# Patient Record
Sex: Male | Born: 1950
Health system: Southern US, Community
[De-identification: ages and names within clinical notes are randomized; demographics above are authoritative.]

## PROBLEM LIST (undated history)

## (undated) DIAGNOSIS — I4891 Unspecified atrial fibrillation: Secondary | ICD-10-CM

## (undated) DIAGNOSIS — F1020 Alcohol dependence, uncomplicated: Secondary | ICD-10-CM

## (undated) DIAGNOSIS — I639 Cerebral infarction, unspecified: Secondary | ICD-10-CM

## (undated) DIAGNOSIS — E785 Hyperlipidemia, unspecified: Secondary | ICD-10-CM

## (undated) DIAGNOSIS — I714 Abdominal aortic aneurysm, without rupture, unspecified: Secondary | ICD-10-CM

## (undated) DIAGNOSIS — I712 Thoracic aortic aneurysm, without rupture, unspecified: Secondary | ICD-10-CM

## (undated) DIAGNOSIS — I10 Essential (primary) hypertension: Secondary | ICD-10-CM

## (undated) DIAGNOSIS — N183 Chronic kidney disease, stage 3 unspecified: Secondary | ICD-10-CM

## (undated) HISTORY — DX: Chronic kidney disease, stage 3 unspecified: N18.30

## (undated) HISTORY — DX: Abdominal aortic aneurysm, without rupture: I71.4

## (undated) HISTORY — DX: Cerebral infarction, unspecified: I63.9

## (undated) HISTORY — DX: Unspecified atrial fibrillation: I48.91

## (undated) HISTORY — DX: Hyperlipidemia, unspecified: E78.5

## (undated) HISTORY — DX: Abdominal aortic aneurysm, without rupture, unspecified: I71.40

## (undated) HISTORY — DX: Thoracic aortic aneurysm, without rupture: I71.2

## (undated) HISTORY — DX: Essential (primary) hypertension: I10

## (undated) HISTORY — DX: Thoracic aortic aneurysm, without rupture, unspecified: I71.20

---

## 2000-03-13 HISTORY — PX: CARDIOVASCULAR STRESS TEST: SHX262

## 2000-09-17 ENCOUNTER — Ambulatory Visit (HOSPITAL_COMMUNITY): Admission: RE | Admit: 2000-09-17 | Discharge: 2000-09-17 | Payer: Self-pay | Admitting: Family Medicine

## 2000-12-17 ENCOUNTER — Encounter: Payer: Self-pay | Admitting: Family Medicine

## 2000-12-17 ENCOUNTER — Ambulatory Visit (HOSPITAL_COMMUNITY): Admission: RE | Admit: 2000-12-17 | Discharge: 2000-12-17 | Payer: Self-pay | Admitting: Family Medicine

## 2004-06-23 ENCOUNTER — Ambulatory Visit (HOSPITAL_COMMUNITY): Admission: RE | Admit: 2004-06-23 | Discharge: 2004-06-23 | Payer: Self-pay | Admitting: Family Medicine

## 2009-12-07 ENCOUNTER — Encounter: Payer: Self-pay | Admitting: Gastroenterology

## 2009-12-08 ENCOUNTER — Encounter: Payer: Self-pay | Admitting: Gastroenterology

## 2009-12-11 HISTORY — PX: COLONOSCOPY: SHX174

## 2009-12-13 ENCOUNTER — Ambulatory Visit: Payer: Self-pay | Admitting: Gastroenterology

## 2009-12-13 ENCOUNTER — Ambulatory Visit (HOSPITAL_COMMUNITY): Admission: RE | Admit: 2009-12-13 | Discharge: 2009-12-13 | Payer: Self-pay | Admitting: Gastroenterology

## 2010-04-12 NOTE — Letter (Signed)
Summary: TRIAGE ORDER  TRIAGE ORDER   Imported By: Ave Filter 12/08/2009 10:12:53  _____________________________________________________________________  External Attachment:    Type:   Image     Comment:   External Document

## 2010-04-12 NOTE — Letter (Signed)
Summary: [name]  [name]   Imported By: Cloria Spring LPN 16/12/9602 54:09:81  _____________________________________________________________________  External Attachment:    Type:   Image     Comment:   External Document  Appended Document: [name] [name]  Appended Document: [name] Scheduled for 12/13/2009 @ 7:30. Order faxed to [facility]. Rx and instructions faxed to [facility]. LMOM for pt that it has been faxed.

## 2012-07-30 ENCOUNTER — Encounter: Payer: Self-pay | Admitting: Family Medicine

## 2012-07-30 ENCOUNTER — Ambulatory Visit (INDEPENDENT_AMBULATORY_CARE_PROVIDER_SITE_OTHER): Payer: BC Managed Care – PPO | Admitting: Family Medicine

## 2012-07-30 VITALS — BP 124/78 | Temp 99.0°F | Wt 203.0 lb

## 2012-07-30 DIAGNOSIS — M545 Low back pain, unspecified: Secondary | ICD-10-CM

## 2012-07-30 DIAGNOSIS — E785 Hyperlipidemia, unspecified: Secondary | ICD-10-CM

## 2012-07-30 DIAGNOSIS — Z125 Encounter for screening for malignant neoplasm of prostate: Secondary | ICD-10-CM

## 2012-07-30 DIAGNOSIS — R1013 Epigastric pain: Secondary | ICD-10-CM

## 2012-07-30 DIAGNOSIS — Z79899 Other long term (current) drug therapy: Secondary | ICD-10-CM

## 2012-07-30 DIAGNOSIS — Z0000 Encounter for general adult medical examination without abnormal findings: Secondary | ICD-10-CM

## 2012-07-30 LAB — POCT URINALYSIS DIPSTICK
Spec Grav, UA: 1.02
pH, UA: 6

## 2012-07-30 LAB — BASIC METABOLIC PANEL
Calcium: 9.3 mg/dL (ref 8.4–10.5)
Creat: 1.68 mg/dL — ABNORMAL HIGH (ref 0.50–1.35)
Sodium: 141 mEq/L (ref 135–145)

## 2012-07-30 LAB — LIPASE: Lipase: 39 U/L (ref 0–75)

## 2012-07-30 LAB — HEPATIC FUNCTION PANEL
ALT: 11 U/L (ref 0–53)
AST: 18 U/L (ref 0–37)
Albumin: 4.4 g/dL (ref 3.5–5.2)
Alkaline Phosphatase: 46 U/L (ref 39–117)
Total Bilirubin: 0.6 mg/dL (ref 0.3–1.2)
Total Protein: 6.9 g/dL (ref 6.0–8.3)

## 2012-07-30 LAB — CBC WITH DIFFERENTIAL/PLATELET
Basophils Absolute: 0.1 10*3/uL (ref 0.0–0.1)
Eosinophils Relative: 2 % (ref 0–5)
HCT: 39.8 % (ref 39.0–52.0)
Lymphocytes Relative: 21 % (ref 12–46)
Lymphs Abs: 1.7 10*3/uL (ref 0.7–4.0)
MCV: 101 fL — ABNORMAL HIGH (ref 78.0–100.0)
Neutro Abs: 5.6 10*3/uL (ref 1.7–7.7)
Platelets: 245 10*3/uL (ref 150–400)
RBC: 3.94 MIL/uL — ABNORMAL LOW (ref 4.22–5.81)
WBC: 8.1 10*3/uL (ref 4.0–10.5)

## 2012-07-30 LAB — LIPID PANEL
HDL: 85 mg/dL (ref >39)
Total CHOL/HDL Ratio: 2.4 Ratio
Triglycerides: 53 mg/dL (ref <150)

## 2012-07-30 MED ORDER — PANTOPRAZOLE SODIUM 40 MG PO TBEC: 40.0000 mg | DELAYED_RELEASE_TABLET | Freq: Every day | ORAL | Status: DC

## 2012-07-30 NOTE — Progress Notes (Signed)
  Subjective:    Patient ID: Anthony Little, male    DOB: Mar 20, 1950, 62 y.o.   MRN: [id number]  HPI  Patient having abdominal bloating and back pain for 2 weeks A, upper abdomen,   ache in lower back, intensity varies, movement doesn't affect it, worse with standing, doesn't radiate, no known trigger, nothing tried for it. Also feels bloated also passing gas, no paoin with eating, no regurg, BM-nl Smoking - Still smokes she is trying to work his way down he has been counseled Family history noncontributory Review of Systems Negative for vomiting diarrhea he relates more bloating some pain with at times but nothing severe he just feels more like a bloated sensation also has some intermittent dull sensation in the left flank area but no dysuria or hematuria no hematochezia.     Objective:   Physical Exam Neck no masses lungs are clear no crackles, heart is regular no murmurs, pulses are normal skin warm dry no edema in the legs abdomen is soft no guarding rebound or tenderness no enlargement of the organs noted no masses are noted. Prostate exam is normal.       Assessment & Plan:  Laboratory work was ordered protonix 40 mg 1 daily he will followup in 2-3 weeks this as a therapeutic trial we'll see how it does help his symptoms if he doesn't improve he will need referral for possible ultrasound CAT scan and GI workup possible EGD. Probable dyspepsia Flank discomfort we'll see how that is doing on followup may need further testing as well.

## 2012-07-31 LAB — PSA: PSA: 1.06 ng/mL (ref <=4.00)

## 2012-08-06 ENCOUNTER — Telehealth: Payer: Self-pay | Admitting: *Deleted

## 2012-08-06 ENCOUNTER — Other Ambulatory Visit: Payer: Self-pay | Admitting: *Deleted

## 2012-08-06 DIAGNOSIS — Z79899 Other long term (current) drug therapy: Secondary | ICD-10-CM

## 2012-08-06 NOTE — Telephone Encounter (Signed)
done

## 2012-08-10 ENCOUNTER — Encounter: Payer: Self-pay | Admitting: *Deleted

## 2012-08-15 ENCOUNTER — Telehealth: Payer: Self-pay | Admitting: Family Medicine

## 2012-08-15 DIAGNOSIS — Z79899 Other long term (current) drug therapy: Secondary | ICD-10-CM

## 2012-08-15 DIAGNOSIS — D649 Anemia, unspecified: Secondary | ICD-10-CM

## 2012-08-15 NOTE — Telephone Encounter (Signed)
Blood work Clinical biochemist and mailed to patient.

## 2012-08-15 NOTE — Telephone Encounter (Signed)
Met 7 , cbc , B 12  VHQ:[id number] anemia, renal insuff, B12 deficiency

## 2012-08-15 NOTE — Telephone Encounter (Signed)
Patient says that he spoke with Dr. Lorin Picket last week about having BW mailed to him, but hasn't received anything yet. He would like to pick up his BW paperwork tomorrow.

## 2012-08-16 LAB — BASIC METABOLIC PANEL
Calcium: 9.6 mg/dL (ref 8.4–10.5)
Glucose, Bld: 97 mg/dL (ref 70–99)
Potassium: 4.6 mEq/L (ref 3.5–5.3)
Sodium: 141 mEq/L (ref 135–145)

## 2012-08-16 LAB — CBC WITH DIFFERENTIAL/PLATELET
Basophils Absolute: 0.1 10*3/uL (ref 0.0–0.1)
Eosinophils Relative: 3 % (ref 0–5)
HCT: 37.4 % — ABNORMAL LOW (ref 39.0–52.0)
Lymphocytes Relative: 24 % (ref 12–46)
Lymphs Abs: 1.8 10*3/uL (ref 0.7–4.0)
MCV: 98.7 fL (ref 78.0–100.0)
Monocytes Absolute: 0.5 10*3/uL (ref 0.1–1.0)
Neutro Abs: 4.8 10*3/uL (ref 1.7–7.7)
RBC: 3.79 MIL/uL — ABNORMAL LOW (ref 4.22–5.81)
RDW: 13.6 % (ref 11.5–15.5)
WBC: 7.3 10*3/uL (ref 4.0–10.5)

## 2012-08-20 ENCOUNTER — Telehealth: Payer: Self-pay | Admitting: Family Medicine

## 2012-08-20 ENCOUNTER — Ambulatory Visit: Payer: BC Managed Care – PPO | Admitting: Family Medicine

## 2012-08-20 NOTE — Telephone Encounter (Signed)
Patient was seen on Jul 30, 2012 by Dr. Lorin Picket.  Wife called today to state that the medication Mr. Schwier is currently taking is not helping with the pain in his stomach.  Would like to know if other test such as an Ultrasound can be ordered before patient's next appointment.  States patient is not eating much at any given time due to pain this causes.  Next appointment is Tuesday August 27, 2012 at 2:00pm    Please call patient with recommendations.

## 2012-08-20 NOTE — Telephone Encounter (Signed)
This patient would best benefit from a CAT scan of the abdomen with contrast. It is important to confirm with the patient that he wants to go forward with this. If so we can set this CAT scan up before his office visit preferably so we will have results to discuss with him when he comes.

## 2012-08-23 NOTE — Telephone Encounter (Signed)
Spoke with Nurse.  She has left a message for a return call

## 2012-08-23 NOTE — Telephone Encounter (Signed)
Left message to return call 

## 2012-08-23 NOTE — Telephone Encounter (Signed)
Please see previous note, make sure it is being handled. Thank you.

## 2012-08-26 NOTE — Telephone Encounter (Signed)
Apparently playing phone tag. Please verify with family what their needs are. Thank you.

## 2012-08-27 ENCOUNTER — Encounter: Payer: Self-pay | Admitting: Family Medicine

## 2012-08-27 ENCOUNTER — Ambulatory Visit (INDEPENDENT_AMBULATORY_CARE_PROVIDER_SITE_OTHER): Payer: BC Managed Care – PPO | Admitting: Family Medicine

## 2012-08-27 VITALS — BP 148/80 | HR 70 | Wt 204.0 lb

## 2012-08-27 DIAGNOSIS — R1013 Epigastric pain: Secondary | ICD-10-CM

## 2012-08-27 NOTE — Telephone Encounter (Signed)
Patient didn't return call . To discuss at office visit today 08/27/12.

## 2012-08-27 NOTE — Telephone Encounter (Signed)
Patient here for office visit with Dr. Lorin Picket.

## 2012-08-27 NOTE — Progress Notes (Signed)
  Subjective:    Patient ID: Anthony Little, male    DOB: 01-Nov-1950, 62 y.o.   MRN: [id number]  HPIpatient here for abdominal bloating after eating. Started about 2 months ago. Patient not able to eat much. Patient relates that he feels a bloated feeling in his upper abdomen he also feels pressure and discomfort in the abdomen in addition this poor appetite this is more present over the past several months. He tried a PPI. Tried varying what he eats. He states at times does wake him up at night. He denies weight loss over the past month but he has had some moderate weight loss over the past 6 months. He denies any other particular troubles currently. Past medical history benign family history benign Patient does smoke he does not drink  Review of Systems See above. No vomiting of blood no rectal bleeding.    Objective:   Physical Exam  Lungs are clear heart is regular abdomen soft no masses are felt generalized abdominal discomfort midabdomen. No guarding or rebound. Extremities no edema.      Assessment & Plan:  Abdominal discomfort-it is concerning this been going on for several months. There has been some slight weight loss. I recommend that we pursue forward with combination of continuing PPI, lab work looked okay, order CT scan of the abdomen and pelvis with contrast, set up patient to be seen by gastroenterology. If CT scan negative probably will need EGD possibly workup including ultrasound and HIDA test. Patient understands all of this and agrees.

## 2012-08-29 ENCOUNTER — Encounter (HOSPITAL_COMMUNITY): Payer: Self-pay

## 2012-08-29 ENCOUNTER — Ambulatory Visit (HOSPITAL_COMMUNITY)
Admission: RE | Admit: 2012-08-29 | Discharge: 2012-08-29 | Disposition: A | Discharge status: Home / Self Care | Payer: BC Managed Care – PPO | Source: Ambulatory Visit | Attending: Family Medicine | Admitting: Family Medicine

## 2012-08-29 DIAGNOSIS — I10 Essential (primary) hypertension: Secondary | ICD-10-CM | POA: Insufficient documentation

## 2012-08-29 DIAGNOSIS — R1013 Epigastric pain: Secondary | ICD-10-CM | POA: Insufficient documentation

## 2012-08-29 DIAGNOSIS — K5730 Diverticulosis of large intestine without perforation or abscess without bleeding: Secondary | ICD-10-CM | POA: Insufficient documentation

## 2012-08-29 MED ORDER — IOHEXOL 300 MG/ML  SOLN
100.0000 mL | Freq: Once | INTRAMUSCULAR | Status: AC | PRN
  Administered 2012-08-29: 100 mL via INTRAVENOUS

## 2012-10-02 ENCOUNTER — Encounter: Payer: Self-pay | Admitting: Gastroenterology

## 2012-10-02 ENCOUNTER — Ambulatory Visit: Payer: BC Managed Care – PPO | Admitting: Gastroenterology

## 2012-10-02 ENCOUNTER — Ambulatory Visit (INDEPENDENT_AMBULATORY_CARE_PROVIDER_SITE_OTHER): Payer: BC Managed Care – PPO | Admitting: Gastroenterology

## 2012-10-02 VITALS — BP 145/71 | HR 67 | Temp 98.2°F | Ht 69.0 in | Wt 198.0 lb

## 2012-10-02 DIAGNOSIS — R6881 Early satiety: Secondary | ICD-10-CM

## 2012-10-02 DIAGNOSIS — R109 Unspecified abdominal pain: Secondary | ICD-10-CM

## 2012-10-02 DIAGNOSIS — G8929 Other chronic pain: Secondary | ICD-10-CM | POA: Insufficient documentation

## 2012-10-02 DIAGNOSIS — R1010 Upper abdominal pain, unspecified: Secondary | ICD-10-CM

## 2012-10-02 DIAGNOSIS — R634 Abnormal weight loss: Secondary | ICD-10-CM

## 2012-10-02 NOTE — Progress Notes (Signed)
Primary Care Physician:  Lilyan Punt, MD  Primary Gastroenterologist:  Jonette Eva, MD   Chief Complaint  Patient presents with  . Bloated  . Gas    HPI:  Anthony Little is a 62 y.o. male here for further evaluation of abdominal pain and weight loss. He was seen initially in May by his PCP for the symptoms. He was started on pantoprazole at that time. He had a CT the abdomen pelvis with contrast on 08/27/2012 that showed descending and sigmoid diverticulosis, aortoiliac arterial calcifications but otherwise unremarkable. Labs as outlined below.  Four months of early satiety, bloating, increased flatulence, discomfort in epigastrium and radiating into left back. Lost 10 pounds in last three months. Not really trying. Has eliminated fried foods. Increased dietary fiber. New diet about 7-8 months ago. No heartburn, dysphagia, diarrhea, melena, brbpr. Some intermittent constipation, rare Dulcolax. No ASA, NSAIDs. Took pantoprazole 40mg  daily for one month without notable improvement. Stopped few weeks ago.   Current Outpatient Prescriptions  Medication Sig Dispense Refill  . lisinopril-hydrochlorothiazide (PRINZIDE,ZESTORETIC) 20-12.5 MG per tablet Take 1 tablet by mouth daily.      . Multiple Vitamin (MULTIVITAMIN) tablet Take 1 tablet by mouth daily.       No current facility-administered medications for this visit.    Allergies as of 10/02/2012  . (No Known Allergies)    Past Medical History  Diagnosis Date  . Hypertension   . Hyperlipidemia     Past Surgical History  Procedure Laterality Date  . Colonoscopy  12/2009    Diverticulosis, mild internal hemorrhoids, multiple colon polyps removed (adenomatous). One irregular shaped polyp was tattooed. Next colonoscopy due October 2016.  . Cardiovascular stress test  2002    negative    Family History  Problem Relation Age of Onset  . Diabetes Mother   . Diabetes Maternal Grandfather   . Ulcers Neg Hx   . Colon cancer Neg  Hx     History   Social History  . Marital Status: Single    Spouse Name: N/A    Number of Children: N/A  . Years of Education: N/A   Occupational History  . [employer]    Social History Main Topics  . Smoking status: Current Every Day Smoker  . Smokeless tobacco: Not on file  . Alcohol Use: Yes     Comment: liquor 5 ounces per night, long-time  . Drug Use: Not on file  . Sexually Active: Not on file   Other Topics Concern  . Not on file   Social History Narrative  . No narrative on file      ROS:  General: Negative for anorexia, fever, chills, fatigue, weakness. Eyes: Negative for vision changes.  ENT: Negative for hoarseness, difficulty swallowing , nasal congestion. CV: Negative for chest pain, angina, palpitations, dyspnea on exertion, peripheral edema.  Respiratory: Negative for dyspnea at rest, dyspnea on exertion, cough, sputum, wheezing.  GI: See history of present illness. GU:  Negative for dysuria, hematuria, urinary incontinence, urinary frequency, nocturnal urination.  MS: Negative for joint pain, low back pain.  Derm: Negative for rash or itching.  Neuro: Negative for weakness, abnormal sensation, seizure, frequent headaches, memory loss, confusion.  Psych: Negative for anxiety, depression, suicidal ideation, hallucinations.  Endo: see hpi Heme: Negative for bruising or bleeding. Allergy: Negative for rash or hives.    Physical Examination:  BP 145/71  Pulse 67  Temp(Src) 98.2 F (36.8 C) (Oral)  Ht 5\' 9"  (1.753 m)  Wt 198  lb (89.812 kg)  BMI 29.23 kg/m2   General: Well-nourished, well-developed in no acute distress.  Head: Normocephalic, atraumatic.   Eyes: Conjunctiva pink, no icterus. Mouth: Oropharyngeal mucosa moist and pink , no lesions erythema or exudate. Neck: Supple without thyromegaly, masses, or lymphadenopathy.  Lungs: Clear to auscultation bilaterally.  Heart: Regular rate and rhythm, no murmurs rubs or gallops.   Abdomen: Bowel sounds are normal, nontender, nondistended, no hepatosplenomegaly or masses, no abdominal bruits or    hernia , no rebound or guarding.   Rectal: not performed Extremities: No lower extremity edema. No clubbing or deformities.  Neuro: Alert and oriented x 4 , grossly normal neurologically.  Skin: Warm and dry, no rash or jaundice.   Psych: Alert and cooperative, normal mood and affect.  Labs: Lab Results  Component Value Date   WBC 7.3 08/16/2012   HGB 12.9* 08/16/2012   HCT 37.4* 08/16/2012   MCV 98.7 08/16/2012   PLT 259 08/16/2012   Lab Results  Component Value Date   VITAMINB12 403 08/16/2012   Lab Results  Component Value Date   CREATININE 1.22 08/16/2012   BUN 20 08/16/2012   NA 141 08/16/2012   K 4.6 08/16/2012   CL 103 08/16/2012   CO2 25 08/16/2012   Lab Results  Component Value Date   ALT 11 07/30/2012   AST 18 07/30/2012   ALKPHOS 46 07/30/2012   BILITOT 0.6 07/30/2012   Lab Results  Component Value Date   LIPASE 39 07/30/2012     Imaging Studies: No results found.

## 2012-10-02 NOTE — Patient Instructions (Addendum)
We have scheduled you for upper endoscopy with Dr. Darrick Penna. Please see separate instructions.

## 2012-10-02 NOTE — Assessment & Plan Note (Signed)
62 year old gentleman with 4-5 month history of vague upper abdominal discomfort associated with bloating, early satiety, 10 pound unintentional weight loss. No improvement with 30 days of pantoprazole. Denies aspirin or NSAIDs. He does consume significant amount of liquor on the weekends. Recent CT scan of the abdomen did not show any signs of advanced liver disease. He had mild normocytic anemia on his last CBC.  Recommend upper endoscopy for further evaluation. Differential diagnosis includes gastritis, peptic ulcer disease, malignancy. Biliary etiology not excluded but less likely based on his symptoms. We will augment conscious sedation with Phenergan 25 mg IV 30 minutes before the procedure given his history of alcohol use. He recalls waking up briefly at time of colonoscopy a few years back, at that time he received Demerol 100 mg IV/Versed 7 mg IV.  I have discussed the risks, alternatives, benefits with regards to but not limited to the risk of reaction to medication, bleeding, infection, perforation and the patient is agreeable to proceed. Written consent to be obtained.  Recommend decreasing etoh use with goals of stopping. Based on EGD findings, consider recheck CBC to make sure no further drop in H/H.

## 2012-10-02 NOTE — Progress Notes (Signed)
Cc PCP 

## 2012-10-07 ENCOUNTER — Other Ambulatory Visit: Payer: Self-pay | Admitting: Gastroenterology

## 2012-10-07 ENCOUNTER — Encounter (HOSPITAL_COMMUNITY): Payer: Self-pay | Admitting: Pharmacy Technician

## 2012-10-07 DIAGNOSIS — R6881 Early satiety: Secondary | ICD-10-CM

## 2012-10-07 DIAGNOSIS — R109 Unspecified abdominal pain: Secondary | ICD-10-CM

## 2012-10-07 DIAGNOSIS — R634 Abnormal weight loss: Secondary | ICD-10-CM

## 2012-10-08 ENCOUNTER — Encounter (HOSPITAL_COMMUNITY): Payer: Self-pay

## 2012-10-08 ENCOUNTER — Ambulatory Visit (HOSPITAL_COMMUNITY)
Admission: RE | Admit: 2012-10-08 | Discharge: 2012-10-08 | Disposition: A | Discharge status: Home / Self Care | Payer: BC Managed Care – PPO | Source: Ambulatory Visit | Attending: Gastroenterology | Admitting: Gastroenterology

## 2012-10-08 ENCOUNTER — Encounter (HOSPITAL_COMMUNITY)
Admission: RE | Disposition: A | Discharge status: Home / Self Care | Payer: Self-pay | Source: Ambulatory Visit | Attending: Gastroenterology

## 2012-10-08 DIAGNOSIS — R1010 Upper abdominal pain, unspecified: Secondary | ICD-10-CM

## 2012-10-08 DIAGNOSIS — K2960 Other gastritis without bleeding: Secondary | ICD-10-CM

## 2012-10-08 DIAGNOSIS — Z79899 Other long term (current) drug therapy: Secondary | ICD-10-CM | POA: Insufficient documentation

## 2012-10-08 DIAGNOSIS — E785 Hyperlipidemia, unspecified: Secondary | ICD-10-CM | POA: Insufficient documentation

## 2012-10-08 DIAGNOSIS — K3189 Other diseases of stomach and duodenum: Secondary | ICD-10-CM | POA: Insufficient documentation

## 2012-10-08 DIAGNOSIS — R6881 Early satiety: Secondary | ICD-10-CM

## 2012-10-08 DIAGNOSIS — K2940 Chronic atrophic gastritis without bleeding: Secondary | ICD-10-CM | POA: Insufficient documentation

## 2012-10-08 DIAGNOSIS — K449 Diaphragmatic hernia without obstruction or gangrene: Secondary | ICD-10-CM

## 2012-10-08 DIAGNOSIS — K2980 Duodenitis without bleeding: Secondary | ICD-10-CM | POA: Insufficient documentation

## 2012-10-08 DIAGNOSIS — I10 Essential (primary) hypertension: Secondary | ICD-10-CM | POA: Insufficient documentation

## 2012-10-08 DIAGNOSIS — R634 Abnormal weight loss: Secondary | ICD-10-CM

## 2012-10-08 DIAGNOSIS — R1013 Epigastric pain: Secondary | ICD-10-CM

## 2012-10-08 DIAGNOSIS — R109 Unspecified abdominal pain: Secondary | ICD-10-CM

## 2012-10-08 DIAGNOSIS — F17200 Nicotine dependence, unspecified, uncomplicated: Secondary | ICD-10-CM | POA: Insufficient documentation

## 2012-10-08 HISTORY — PX: ESOPHAGOGASTRODUODENOSCOPY: SHX5428

## 2012-10-08 SURGERY — EGD (ESOPHAGOGASTRODUODENOSCOPY)
Anesthesia: Moderate Sedation

## 2012-10-08 MED ORDER — SODIUM CHLORIDE 0.9 % IV SOLN
INTRAVENOUS | Status: DC
  Administered 2012-10-08: 12:00:00 via INTRAVENOUS

## 2012-10-08 MED ORDER — MIDAZOLAM HCL 5 MG/5ML IJ SOLN
INTRAMUSCULAR | Status: DC | PRN
  Administered 2012-10-08 (×2): 2 mg via INTRAVENOUS

## 2012-10-08 MED ORDER — OMEPRAZOLE 20 MG PO CPDR: DELAYED_RELEASE_CAPSULE | ORAL | Status: DC

## 2012-10-08 MED ORDER — PROMETHAZINE HCL 25 MG/ML IJ SOLN
25.0000 mg | Freq: Once | INTRAMUSCULAR | Status: AC
  Administered 2012-10-08: 25 mg via INTRAVENOUS

## 2012-10-08 MED ORDER — BUTAMBEN-TETRACAINE-BENZOCAINE 2-2-14 % EX AERO
INHALATION_SPRAY | CUTANEOUS | Status: DC | PRN
  Administered 2012-10-08: 2 via TOPICAL

## 2012-10-08 MED ORDER — MEPERIDINE HCL 100 MG/ML IJ SOLN
INTRAMUSCULAR | Status: DC | PRN
  Administered 2012-10-08: 25 mg via INTRAVENOUS
  Administered 2012-10-08: 50 mg via INTRAVENOUS

## 2012-10-08 MED ORDER — STERILE WATER FOR IRRIGATION IR SOLN
Status: DC | PRN
  Administered 2012-10-08: 12:00:00

## 2012-10-08 MED ORDER — MEPERIDINE HCL 100 MG/ML IJ SOLN
INTRAMUSCULAR | Status: AC
  Filled 2012-10-08: qty 2

## 2012-10-08 MED ORDER — PROMETHAZINE HCL 25 MG/ML IJ SOLN
INTRAMUSCULAR | Status: AC
  Filled 2012-10-08: qty 1

## 2012-10-08 MED ORDER — SODIUM CHLORIDE 0.9 % IJ SOLN
INTRAMUSCULAR | Status: AC
  Filled 2012-10-08: qty 10

## 2012-10-08 MED ORDER — MIDAZOLAM HCL 5 MG/5ML IJ SOLN
INTRAMUSCULAR | Status: AC
  Filled 2012-10-08: qty 10

## 2012-10-08 NOTE — Progress Notes (Signed)
REVIEWED.  

## 2012-10-08 NOTE — Op Note (Signed)
[facility] [address] [place], [address]   ENDOSCOPY PROCEDURE REPORT  PATIENT: Anthony, Little  MR#: [id number] BIRTHDATE: [birth year] , 62  yrs. old GENDER: Male  ENDOSCOPIST: Jonette Eva, MD REFERRED AO:ZHYQM Gerda Diss, M.D.  PROCEDURE DATE: 10/08/2012 PROCEDURE:   EGD w/ biopsy  INDICATIONS:Dyspepsia. MEDICATIONS: PREOP: Promethazine (Phenergan) 25mg  IV, Demerol 75 mg IV, and Versed 4 mg IV TOPICAL ANESTHETIC:   Cetacaine Spray  DESCRIPTION OF PROCEDURE:     Physical exam was performed.  Informed consent was obtained from the patient after explaining the benefits, risks, and alternatives to the procedure.  The patient was connected to the monitor and placed in the left lateral position.  Continuous oxygen was provided by nasal cannula and IV medicine administered through an indwelling cannula.  After administration of sedation, the patients esophagus was intubated and the EG-[id number] ([id number])  endoscope was advanced under direct visualization to the second portion of the duodenum.  The scope was removed slowly by carefully examining the color, texture, anatomy, and integrity of the mucosa on the way out.  The patient was recovered in endoscopy and discharged home in satisfactory condition.   ESOPHAGUS: The mucosa of the esophagus appeared normal. NO BARRETT'S.  A small hiatal hernia was noted.   STOMACH: Moderate erosive gastritis (inflammation) was found in the gastric body and gastric antrum.  Multiple biopsies were performed.   DUODENUM: Mild duodenal inflammation was found in the duodenal bulb.   The duodenal mucosa showed no abnormalities in the 2nd part of the duodenum.  COMPLICATIONS:   None  ENDOSCOPIC IMPRESSION: 1.   Small hiatal hernia 3.   MODERATE Erosive gastritis  & MILD DUODENITIS  RECOMMENDATIONS: AWAIT BIOPSY.  IF NO H PYLORI, PT NEEDS HBT FOR SIBO. TAKE OMEPRAZOLE 30 MINUTES PRIOR TO MEALS TWICE DAILY FOR 3 MOS THEN ONCE  DAILY FOR THE NEXT YEAR. AVOID TRIGGERS FOR GASTRITIS. FOLLOW A LOW FAT DIET. FOLLOW UP IN 4 MOS.   REPEAT EXAM:   _______________________________ Rosalie DoctorJonette Eva, MD 10/08/2012 1:06 PM

## 2012-10-08 NOTE — H&P (Signed)
  Primary Care Physician:  Lilyan Punt, MD Primary Gastroenterologist:  Dr. Darrick Penna  Pre-Procedure History & Physical: HPI:  Anthony Little is a 62 y.o. male here for DYSPEPSIA.   Past Medical History  Diagnosis Date  . Hypertension   . Hyperlipidemia     Past Surgical History  Procedure Laterality Date  . Colonoscopy  12/2009    Diverticulosis, mild internal hemorrhoids, multiple colon polyps removed (adenomatous). One irregular shaped polyp was tattooed. Next colonoscopy due October 2016.  . Cardiovascular stress test  2002    negative    Prior to Admission medications   Medication Sig Start Date End Date Taking? Authorizing Provider  lisinopril-hydrochlorothiazide (PRINZIDE,ZESTORETIC) 20-12.5 MG per tablet Take 1 tablet by mouth daily.   Yes Historical Provider, MD  Multiple Vitamin (MULTIVITAMIN) tablet Take 1 tablet by mouth daily.   Yes Historical Provider, MD    Allergies as of 10/02/2012  . (No Known Allergies)    Family History  Problem Relation Age of Onset  . Diabetes Mother   . Diabetes Maternal Grandfather   . Ulcers Neg Hx   . Colon cancer Neg Hx     History   Social History  . Marital Status: Married    Spouse Name: N/A    Number of Children: N/A  . Years of Education: N/A   Occupational History  . [employer]    Social History Main Topics  . Smoking status: Current Every Day Smoker  . Smokeless tobacco: Not on file  . Alcohol Use: Yes     Comment: liquor 5 ounces per night, long-time  . Drug Use: Not on file  . Sexually Active: Not on file   Other Topics Concern  . Not on file   Social History Narrative  . No narrative on file    Review of Systems: See HPI, otherwise negative ROS   Physical Exam: BP 134/71  Pulse 64  Temp(Src) 98.4 F (36.9 C) (Oral)  Resp 22  Ht 5\' 9"  (1.753 m)  Wt 200 lb (90.719 kg)  BMI 29.52 kg/m2  SpO2 98% General:   Alert,  pleasant and cooperative in NAD Head:  Normocephalic and  atraumatic. Neck:  Supple; Lungs:  Clear throughout to auscultation.    Heart:  Regular rate and rhythm. Abdomen:  Soft, nontender and nondistended. Normal bowel sounds, without guarding, and without rebound.   Neurologic:  Alert and  oriented x4;  grossly normal neurologically.  Impression/Plan:     DYSPEPSIA  PLAN:  EGD TODAY

## 2012-10-09 ENCOUNTER — Encounter (HOSPITAL_COMMUNITY): Payer: Self-pay | Admitting: Gastroenterology

## 2012-10-16 ENCOUNTER — Telehealth: Payer: Self-pay | Admitting: Gastroenterology

## 2012-10-16 NOTE — Telephone Encounter (Signed)
Please call pt. His stomach Bx shows gastritis.   He needs a hydrogen breath test to COMPLETE THE evaluation OF his nausea and bloating.  TAKE OMEPRAZOLE 30 MINUTES PRIOR TO MEALS TWICE DAILY FOR 3 MOS THEN ONCE DAILY FOR THE NEXT YEAR.  AVOID TRIGGERS FOR GASTRITIS.   FOLLOW A LOW FAT/HIGH FIBER DIET. AVOID ITEMS THAT CAUSE BLOATING & GAS.   FOLLOW UP IN 4 MOS E30 DYSPEPSIA.

## 2012-10-17 ENCOUNTER — Other Ambulatory Visit: Payer: Self-pay | Admitting: Gastroenterology

## 2012-10-17 NOTE — Telephone Encounter (Signed)
I have HBT scheduled for Tuesday August 19th at 7:30 am and I have mailed the patient instructions

## 2012-10-17 NOTE — Telephone Encounter (Signed)
Reminder in epic °

## 2012-10-17 NOTE — Telephone Encounter (Signed)
REVIEWED.  

## 2012-10-17 NOTE — Telephone Encounter (Signed)
LMOM to call.

## 2012-10-18 ENCOUNTER — Encounter (HOSPITAL_COMMUNITY): Payer: Self-pay | Admitting: Pharmacy Technician

## 2012-10-18 NOTE — Telephone Encounter (Signed)
Patient called back and is aware of Dr. Darrick Penna instructions as well as the HBT

## 2012-10-18 NOTE — Telephone Encounter (Signed)
LMOM to call.

## 2012-10-22 NOTE — Telephone Encounter (Signed)
Pt returned call and was informed of results.  

## 2012-10-23 ENCOUNTER — Telehealth: Payer: Self-pay | Admitting: Gastroenterology

## 2012-10-23 NOTE — Telephone Encounter (Signed)
REVIEWED.  

## 2012-10-23 NOTE — Telephone Encounter (Signed)
Patient is cancelling his HBT Tuesday Aug 19th due to his work schedule he stated he would call back at a later time to have this done

## 2012-10-29 ENCOUNTER — Encounter (HOSPITAL_COMMUNITY): Admission: RE | Payer: Self-pay | Source: Ambulatory Visit

## 2012-10-29 ENCOUNTER — Ambulatory Visit (HOSPITAL_COMMUNITY)
Admission: RE | Admit: 2012-10-29 | Payer: BC Managed Care – PPO | Source: Ambulatory Visit | Admitting: Gastroenterology

## 2012-10-29 SURGERY — BREATH TEST, FOR INTESTINAL BACTERIAL OVERGROWTH

## 2013-03-13 ENCOUNTER — Other Ambulatory Visit: Payer: Self-pay | Admitting: Family Medicine

## 2013-06-13 ENCOUNTER — Encounter (HOSPITAL_COMMUNITY): Payer: Self-pay | Admitting: Emergency Medicine

## 2013-06-13 ENCOUNTER — Emergency Department (HOSPITAL_COMMUNITY)
Admission: EM | Admit: 2013-06-13 | Discharge: 2013-06-14 | Disposition: A | Discharge status: Home / Self Care | Payer: BC Managed Care – PPO | Attending: Emergency Medicine | Admitting: Emergency Medicine

## 2013-06-13 DIAGNOSIS — S0083XA Contusion of other part of head, initial encounter: Secondary | ICD-10-CM

## 2013-06-13 DIAGNOSIS — Z862 Personal history of diseases of the blood and blood-forming organs and certain disorders involving the immune mechanism: Secondary | ICD-10-CM | POA: Insufficient documentation

## 2013-06-13 DIAGNOSIS — S066X9A Traumatic subarachnoid hemorrhage with loss of consciousness of unspecified duration, initial encounter: Secondary | ICD-10-CM | POA: Insufficient documentation

## 2013-06-13 DIAGNOSIS — I10 Essential (primary) hypertension: Secondary | ICD-10-CM | POA: Insufficient documentation

## 2013-06-13 DIAGNOSIS — Y9289 Other specified places as the place of occurrence of the external cause: Secondary | ICD-10-CM | POA: Insufficient documentation

## 2013-06-13 DIAGNOSIS — S1093XA Contusion of unspecified part of neck, initial encounter: Secondary | ICD-10-CM

## 2013-06-13 DIAGNOSIS — S0990XA Unspecified injury of head, initial encounter: Secondary | ICD-10-CM

## 2013-06-13 DIAGNOSIS — Y9389 Activity, other specified: Secondary | ICD-10-CM | POA: Insufficient documentation

## 2013-06-13 DIAGNOSIS — F1010 Alcohol abuse, uncomplicated: Secondary | ICD-10-CM | POA: Insufficient documentation

## 2013-06-13 DIAGNOSIS — I609 Nontraumatic subarachnoid hemorrhage, unspecified: Secondary | ICD-10-CM

## 2013-06-13 DIAGNOSIS — Z8639 Personal history of other endocrine, nutritional and metabolic disease: Secondary | ICD-10-CM | POA: Insufficient documentation

## 2013-06-13 DIAGNOSIS — W010XXA Fall on same level from slipping, tripping and stumbling without subsequent striking against object, initial encounter: Secondary | ICD-10-CM | POA: Insufficient documentation

## 2013-06-13 DIAGNOSIS — Z79899 Other long term (current) drug therapy: Secondary | ICD-10-CM | POA: Insufficient documentation

## 2013-06-13 DIAGNOSIS — W1809XA Striking against other object with subsequent fall, initial encounter: Secondary | ICD-10-CM | POA: Insufficient documentation

## 2013-06-13 DIAGNOSIS — S0003XA Contusion of scalp, initial encounter: Secondary | ICD-10-CM | POA: Insufficient documentation

## 2013-06-13 NOTE — ED Notes (Signed)
Pt to department via EMS.  Pt reports falling after "having too much to drink."  Family called EMS.  Pt denies pain in any location.

## 2013-06-14 ENCOUNTER — Emergency Department (HOSPITAL_COMMUNITY): Payer: BC Managed Care – PPO

## 2013-06-14 MED ORDER — HYDROCODONE-ACETAMINOPHEN 5-325 MG PO TABS: 2.0000 | ORAL_TABLET | ORAL | Status: DC | PRN

## 2013-06-14 NOTE — ED Notes (Signed)
PT DENIES ANY LOC OR HITTING HEAD.

## 2013-06-14 NOTE — Discharge Instructions (Signed)
I have spoken with Dr. Christella Noa, our Neurosurgeon who states that you can be discharged home without a repeat CT scan of the head.  I would recommend that you would have your doctor recheck you on Monday - if you develop worsening symtpoms, return to the ER immediately.  Hydrocodone for pain - Do NOT use aspirin or ibuprofen for pain as this may make the bleeding worse.  Please call your doctor for a followup appointment within 24-48 hours. When you talk to your doctor please let them know that you were seen in the emergency department and have them acquire all of your records so that they can discuss the findings with you and formulate a treatment plan to fully care for your new and ongoing problems.

## 2013-06-14 NOTE — ED Provider Notes (Signed)
CSN: [id number]     Arrival date & time 06/13/13  2337 History   This chart was scribed for Anthony Acosta, MD by Zettie Pho, ED Scribe. This patient was seen in room [facility]18/[facility]18 and the patient's care was started at 12:11 AM.    Chief Complaint  Patient presents with  . Fall    The history is provided by the patient and the spouse. No language interpreter was used.   HPI Comments: Anthony Little is a 63 y.o. male brought in by EMS who presents to the Emergency Department complaining of a fall that occurred PTA after the patient reports that he tripped while intoxicated. His wife reports that the patient may have hit the back of his head on a wooden table and patient is complaining of a constant, dull pain to the area secondary to the incident. His wife reports that the patient had LOC and was unresponsive, eventually coming out of it and had some slight confusion immediately after the incident, but he has since returned to his baseline. He denies weakness, numbness. He states that he took an aspirin at home PTA, but denies taking aspirin regularly or anticoagulant medication use. Patient has a history of HTN and hyperlipidemia.   He has not ambulated after fall  Past Medical History  Diagnosis Date  . Hypertension   . Hyperlipidemia    Past Surgical History  Procedure Laterality Date  . Colonoscopy  12/2009    Diverticulosis, mild internal hemorrhoids, multiple colon polyps removed (adenomatous). One irregular shaped polyp was tattooed. Next colonoscopy due October 2016.  . Cardiovascular stress test  2002    negative  . Esophagogastroduodenoscopy N/A 10/08/2012    Procedure: ESOPHAGOGASTRODUODENOSCOPY (EGD);  Surgeon: Danie Binder, MD;  Location: [facility];  Service: Endoscopy;  Laterality: N/A;  12:45   Family History  Problem Relation Age of Onset  . Diabetes Mother   . Diabetes Maternal Grandfather   . Ulcers Neg Hx   . Colon cancer Neg Hx    History  Substance Use  Topics  . Smoking status: Current Every Day Smoker -- 1.00 packs/day  . Smokeless tobacco: Not on file  . Alcohol Use: Yes     Comment: liquor 5 ounces per night, long-time    Review of Systems  Neurological: Positive for headaches. Negative for weakness and numbness.  Psychiatric/Behavioral: Positive for confusion (resolved).  All other systems reviewed and are negative.      Allergies  Review of patient's allergies indicates no known allergies.  Home Medications   Current Outpatient Rx  Name  Route  Sig  Dispense  Refill  . HYDROcodone-acetaminophen (NORCO/VICODIN) 5-325 MG per tablet   Oral   Take 2 tablets by mouth every 4 (four) hours as needed.   30 tablet   0   . lisinopril-hydrochlorothiazide (PRINZIDE,ZESTORETIC) 20-12.5 MG per tablet      TAKE 1 TABLET DAILY   90 tablet   0   . Multiple Vitamin (MULTIVITAMIN) tablet   Oral   Take 1 tablet by mouth daily.         Marland Kitchen omeprazole (PRILOSEC) 20 MG capsule   Oral   Take 20 mg by mouth daily.          Triage Vitals: BP 112/63  Pulse 67  Temp(Src) 97.8 F (36.6 C)  Resp 20  Ht 5\' 10"  (1.778 m)  Wt 195 lb (88.451 kg)  BMI 27.98 kg/m2  SpO2 98%  Physical Exam  Nursing  note and vitals reviewed. Constitutional: He is oriented to person, place, and time. He appears well-developed and well-nourished. No distress.  HENT:  Head: Normocephalic.  Mouth/Throat: Oropharynx is clear and moist. No oropharyngeal exudate.  Right parietal occipital area has a 4 cm hematoma. No dental injuries.   no facial tenderness, deformity, malocclusion or hemotympanum.  no battle's sign or racoon eyes.   Eyes: Conjunctivae and EOM are normal. Pupils are equal, round, and reactive to light.  Neck: Normal range of motion. Neck supple.  Cardiovascular: Normal rate, regular rhythm and normal heart sounds.   Pulmonary/Chest: Effort normal and breath sounds normal. No respiratory distress.  Abdominal: Soft. He exhibits no  distension. There is no tenderness. There is no rebound and no guarding.  Musculoskeletal: Normal range of motion.  No tenderness to the C, T, or L spine.   Neurological: He is alert and oriented to person, place, and time.  Normal strength and sensation of bilateral upper and lower extremities.   Neurologic exam:  Speech clear, pupils equal round reactive to light, extraocular movements intact  Normal peripheral visual fields Cranial nerves III through XII normal including no facial droop Follows commands, moves all extremities x4, normal strength to bilateral upper and lower extremities at all major muscle groups including grip Sensation normal to light touch and pinprick Coordination intact, no limb ataxia, finger-nose-finger normal Rapid alternating movements normal No pronator drift    Skin: Skin is warm and dry.  Psychiatric: He has a normal mood and affect. His behavior is normal.    ED Course  Procedures (including critical care time)  DIAGNOSTIC STUDIES: Oxygen Saturation is 98% on room air, normal by my interpretation.    COORDINATION OF CARE: 12:16 AM- Will order CTs of the head and C spine. Discussed treatment plan with patient at bedside and patient verbalized agreement.     Labs Review Labs Reviewed - No data to display Imaging Review Ct Head Wo Contrast  06/14/2013   CLINICAL DATA:  Headache.  EXAM: CT HEAD WITHOUT CONTRAST  TECHNIQUE: Contiguous axial images were obtained from the base of the skull through the vertex without intravenous contrast.  COMPARISON:  None.  FINDINGS: No mass. No hydrocephalus. Punctate 6 mm subarachnoid hemorrhage is noted in a left frontal parietal sulcus. No mass effect. Prominent cisterna magna noted. Diffuse atrophy present. Lucency noted in the anterior limb left internal capsule suggesting infarct, age undetermined. White matter changes consistent with chronic ischemia. Orbits are intact. Visualized paranasal sinuses are clear.  Mastoids are clear. No acute bony abnormality.  IMPRESSION: 1. Punctate 6 mm subarachnoid hemorrhage in a left frontal parietal sulcus, no mass effect. Follow-up CT within 24 hrs suggested to ensure stability. 2. Small lucency in the anterior limb of the left internal capsule consistent with an infarct, age undetermined. Chronic white matter ischemia and atrophy.   Electronically Signed   By: Marcello Moores  Register   On: 06/14/2013 01:28   Ct Cervical Spine Wo Contrast  06/14/2013   CLINICAL DATA:  Fall.  EXAM: CT CERVICAL SPINE WITHOUT CONTRAST  TECHNIQUE: Multidetector CT imaging of the cervical spine was performed without intravenous contrast. Multiplanar CT image reconstructions were also generated.  COMPARISON:  None.  FINDINGS: Small cervical lymph nodes are noted. Apical pleural parenchymal thickening noted most consistent scarring. Changes of COPD noted. Carotid atherosclerotic vascular disease. Diffuse degenerative change. No acute abnormality identified. No evidence of fracture dislocation.  IMPRESSION: Diffuse degenerative change.  No acute abnormality.   Electronically Signed  ByMarcello Moores  Register   On: 06/14/2013 01:37      MDM   Final diagnoses:  Subarachnoid hemorrhage  Head injury    The pt had a mechanical fall - had LOC and has large hematoma to head - no daily oral anticoagulants.  No Cervical spine ttp, normal neuro exam,  CT and home if negative.  Rechecked - no NS sx, no CT findings of spinal injury  I have discussed the findings with the pt and Dr. Christella Noa of Neurosurgery who recommends against a repeat CT of the head or admission given the pt's benign appearance and normal neurological status.  Pt is in agreement with the plan - no nsaids, no asa, no etoh, f/u with PMD on Monday  Meds given in ED:  Medications - No data to display  New Prescriptions   HYDROCODONE-ACETAMINOPHEN (NORCO/VICODIN) 5-325 MG PER TABLET    Take 2 tablets by mouth every 4 (four) hours as needed.       I personally performed the services described in this documentation, which was scribed in my presence. The recorded information has been reviewed and is accurate.       Anthony Acosta, MD 06/14/13 [phone]

## 2013-07-01 ENCOUNTER — Other Ambulatory Visit: Payer: Self-pay | Admitting: Family Medicine

## 2013-07-29 ENCOUNTER — Ambulatory Visit: Payer: BC Managed Care – PPO | Admitting: Family Medicine

## 2013-08-07 ENCOUNTER — Ambulatory Visit (INDEPENDENT_AMBULATORY_CARE_PROVIDER_SITE_OTHER): Payer: BC Managed Care – PPO | Admitting: Family Medicine

## 2013-08-07 ENCOUNTER — Encounter: Payer: Self-pay | Admitting: Family Medicine

## 2013-08-07 VITALS — BP 142/74 | Temp 98.2°F | Ht 70.0 in | Wt 192.0 lb

## 2013-08-07 DIAGNOSIS — S065XAA Traumatic subdural hemorrhage with loss of consciousness status unknown, initial encounter: Secondary | ICD-10-CM

## 2013-08-07 DIAGNOSIS — S065X9A Traumatic subdural hemorrhage with loss of consciousness of unspecified duration, initial encounter: Secondary | ICD-10-CM

## 2013-08-07 DIAGNOSIS — R42 Dizziness and giddiness: Secondary | ICD-10-CM

## 2013-08-07 DIAGNOSIS — I6200 Nontraumatic subdural hemorrhage, unspecified: Secondary | ICD-10-CM

## 2013-08-07 LAB — CBC WITH DIFFERENTIAL/PLATELET
BASOS ABS: 0 10*3/uL (ref 0.0–0.1)
BASOS PCT: 0 % (ref 0–1)
Eosinophils Absolute: 0.1 10*3/uL (ref 0.0–0.7)
Eosinophils Relative: 1 % (ref 0–5)
HCT: 36.7 % — ABNORMAL LOW (ref 39.0–52.0)
HEMOGLOBIN: 12.6 g/dL — AB (ref 13.0–17.0)
Lymphocytes Relative: 19 % (ref 12–46)
Lymphs Abs: 1.7 10*3/uL (ref 0.7–4.0)
MCH: 34.1 pg — ABNORMAL HIGH (ref 26.0–34.0)
MCHC: 34.3 g/dL (ref 30.0–36.0)
MCV: 99.2 fL (ref 78.0–100.0)
MONOS PCT: 6 % (ref 3–12)
Monocytes Absolute: 0.5 10*3/uL (ref 0.1–1.0)
NEUTROS ABS: 6.6 10*3/uL (ref 1.7–7.7)
NEUTROS PCT: 74 % (ref 43–77)
Platelets: 253 10*3/uL (ref 150–400)
RBC: 3.7 MIL/uL — ABNORMAL LOW (ref 4.22–5.81)
RDW: 13.9 % (ref 11.5–15.5)
WBC: 8.9 10*3/uL (ref 4.0–10.5)

## 2013-08-07 NOTE — Progress Notes (Signed)
   Subjective:    Patient ID: Anthony Little, male    DOB: 1950/03/23, 63 y.o.   MRN: [id number]  HPIDizziness. Started a few months ago. Feel like the room is spinning. Nausea started today.  Patient states that this been going off and on for several months he relates at times he feels a little dizzy other times he feels like the room is spinning he also states he feels as that he he also relates that he has a hard time looking in certain directions. In addition to this no he has risk factors for stroke and heart disease. He denies any chest pain shortness breath nausea vomiting denies any unilateral numbness or weakness. He has tried conservative measures without success.   Review of Systems     Objective:   Physical Exam His finger to nose waver some on both sides. He also has a slight positive Romberg. His lungs clear hearts regular strength both sides normal reflexes normal. Patient does not have any nystagmus even with laying turning his head to the side in lateral gaze. There is no bruits. No heart murmurs.       Assessment & Plan:  #1 dizziness past there is some elements of in her ear but there is also significant dizziness is not associated with movement that is really felt to be more likely that the patient is dealing with vertebral circulation issues. I recommend MRI to rule out a stroke plus also MRA to look at the blood flow to this area.

## 2013-08-08 LAB — BASIC METABOLIC PANEL
BUN: 34 mg/dL — ABNORMAL HIGH (ref 6–23)
CO2: 25 mEq/L (ref 19–32)
Calcium: 9.6 mg/dL (ref 8.4–10.5)
Chloride: 104 mEq/L (ref 96–112)
Creat: 1.42 mg/dL — ABNORMAL HIGH (ref 0.50–1.35)
Glucose, Bld: 97 mg/dL (ref 70–99)
POTASSIUM: 5.7 meq/L — AB (ref 3.5–5.3)
SODIUM: 138 meq/L (ref 135–145)

## 2013-08-11 ENCOUNTER — Telehealth: Payer: Self-pay | Admitting: Family Medicine

## 2013-08-11 MED ORDER — MECLIZINE HCL 25 MG PO TABS: 25.0000 mg | ORAL_TABLET | ORAL | Status: DC | PRN

## 2013-08-11 NOTE — Telephone Encounter (Signed)
Medication sent to pharmacy. Left message on voicemail notifying patient.  

## 2013-08-11 NOTE — Telephone Encounter (Signed)
Meclizine 25 mg one every 4 hours when necessary vertigo, #40, 3 refills. Cautioned drowsiness

## 2013-08-11 NOTE — Telephone Encounter (Signed)
Patient would like to have a Rx called in for the antivert medication that was discussed in previous visit.  [facility]

## 2013-08-13 ENCOUNTER — Ambulatory Visit (HOSPITAL_COMMUNITY)
Admission: RE | Admit: 2013-08-13 | Discharge: 2013-08-13 | Disposition: A | Discharge status: Home / Self Care | Payer: BC Managed Care – PPO | Source: Ambulatory Visit | Attending: Family Medicine | Admitting: Family Medicine

## 2013-08-13 ENCOUNTER — Other Ambulatory Visit: Payer: Self-pay

## 2013-08-13 DIAGNOSIS — W1809XA Striking against other object with subsequent fall, initial encounter: Secondary | ICD-10-CM | POA: Insufficient documentation

## 2013-08-13 DIAGNOSIS — S065X0A Traumatic subdural hemorrhage without loss of consciousness, initial encounter: Secondary | ICD-10-CM | POA: Insufficient documentation

## 2013-08-13 DIAGNOSIS — R42 Dizziness and giddiness: Secondary | ICD-10-CM | POA: Insufficient documentation

## 2013-08-13 DIAGNOSIS — I671 Cerebral aneurysm, nonruptured: Secondary | ICD-10-CM | POA: Insufficient documentation

## 2013-08-13 DIAGNOSIS — Z79899 Other long term (current) drug therapy: Secondary | ICD-10-CM

## 2013-08-15 ENCOUNTER — Other Ambulatory Visit: Payer: Self-pay | Admitting: Family Medicine

## 2013-08-15 DIAGNOSIS — S065X9A Traumatic subdural hemorrhage with loss of consciousness of unspecified duration, initial encounter: Secondary | ICD-10-CM

## 2013-08-15 DIAGNOSIS — S065XAA Traumatic subdural hemorrhage with loss of consciousness status unknown, initial encounter: Secondary | ICD-10-CM

## 2013-08-19 NOTE — Addendum Note (Signed)
Addended byCharolotte Capuchin D on: 08/19/2013 09:37 AM   Modules accepted: Orders

## 2013-08-19 NOTE — Progress Notes (Signed)
Scheduled MRI of brain w/o contrast at [facility] for July 21 at 7:45am. Pt notified and verbalized understanding.

## 2013-08-28 ENCOUNTER — Ambulatory Visit: Payer: BC Managed Care – PPO | Admitting: Family Medicine

## 2013-08-28 ENCOUNTER — Encounter: Payer: Self-pay | Admitting: *Deleted

## 2013-09-02 ENCOUNTER — Ambulatory Visit (INDEPENDENT_AMBULATORY_CARE_PROVIDER_SITE_OTHER): Payer: BC Managed Care – PPO | Admitting: Family Medicine

## 2013-09-02 ENCOUNTER — Encounter: Payer: Self-pay | Admitting: Family Medicine

## 2013-09-02 VITALS — BP 132/82 | Ht 70.0 in | Wt 191.4 lb

## 2013-09-02 DIAGNOSIS — R252 Cramp and spasm: Secondary | ICD-10-CM

## 2013-09-02 DIAGNOSIS — T148XXA Other injury of unspecified body region, initial encounter: Secondary | ICD-10-CM

## 2013-09-02 DIAGNOSIS — R5383 Other fatigue: Secondary | ICD-10-CM

## 2013-09-02 DIAGNOSIS — R5381 Other malaise: Secondary | ICD-10-CM

## 2013-09-02 DIAGNOSIS — D509 Iron deficiency anemia, unspecified: Secondary | ICD-10-CM

## 2013-09-02 DIAGNOSIS — E876 Hypokalemia: Secondary | ICD-10-CM

## 2013-09-02 NOTE — Progress Notes (Signed)
   Subjective:    Patient ID: MAJESTY OEHLERT, male    DOB: 05/16/1950, 63 y.o.   MRN: [id number]  HPI  Patient arrives with complaint of ongoing dizziness see neurologist tomm. Patient reports he is also having fatigue and muscle cramps- just not feeling well. Patient relates a lot of fatigue tiredness feeling run down denies sweats chills nausea vomiting he does relate dizziness and difficulty with balance. Review of Systems See above. Denies fever chills denies blurred vision denies vomiting    Objective:   Physical Exam  His lungs clear hearts regular pulse normal finger to nose is fair he does waver some when he stands still Romberg slightly positive his coordination and balance is impaired.      Assessment & Plan:  #1 alcohol abuse. I encouraged patient to cut back on his alcohol he needs to quit but he does not feel he can do so currently he states that he would consider treatment by a professional we will check into some options for him  #2 traumatic brain injury patient was encouraged strongly to avoid any activity that could result in the fall. He does need to do a followup MRI. The neurosurgeon that we spoke with couple weeks ago recommended a followup MRI to make sure that the blood collection is gradually resolving. This is on order and is pending.  #3 he has significant postconcussion syndrome with some headaches as well as dizziness and also having some in her ear aspect. He has an appointment with neurology. I'm not sure what they can do but hopefully they can give some recommendations.  #4 encourage patient to quit smoking although this is unlikely currently  Patient will followup again in 8-12 weeks

## 2013-09-03 ENCOUNTER — Ambulatory Visit (INDEPENDENT_AMBULATORY_CARE_PROVIDER_SITE_OTHER): Payer: BC Managed Care – PPO | Admitting: Neurology

## 2013-09-03 ENCOUNTER — Encounter: Payer: Self-pay | Admitting: Neurology

## 2013-09-03 VITALS — BP 118/68 | HR 66 | Temp 98.0°F | Resp 18 | Ht 70.0 in | Wt 192.7 lb

## 2013-09-03 DIAGNOSIS — S065X9A Traumatic subdural hemorrhage with loss of consciousness of unspecified duration, initial encounter: Secondary | ICD-10-CM

## 2013-09-03 DIAGNOSIS — H8110 Benign paroxysmal vertigo, unspecified ear: Secondary | ICD-10-CM

## 2013-09-03 DIAGNOSIS — S065XAA Traumatic subdural hemorrhage with loss of consciousness status unknown, initial encounter: Secondary | ICD-10-CM

## 2013-09-03 DIAGNOSIS — I6200 Nontraumatic subdural hemorrhage, unspecified: Secondary | ICD-10-CM

## 2013-09-03 NOTE — Patient Instructions (Signed)
Your exam looks stable.  I would just monitor for now.  Look out for symptoms such as increased sedation, slurred speech, severe headache, double vision, numbness and weakness on one side of the body.  Go to the ED if you experience this.  Try to take it easy and not to overexert yourself.  I want to see you after your MRI.  The MRA should a tiny aneurysm.  It is nothing to be concerned about and I would just monitor it with repeat imaging every 6 to 12 months.

## 2013-09-03 NOTE — Progress Notes (Addendum)
NEUROLOGY CONSULTATION NOTE  TREA Anthony Little MRN: [id number] DOB: 20-Nov-1950  Referring Shi Grose: Dr. Wolfgang Phoenix Primary care Shadana Pry: Dr. Wolfgang Phoenix  Reason for consult:  Subdural hematoma  HISTORY OF PRESENT ILLNESS: Anthony Little is a 63 year old left-handed man with history of hypertension and hyperlipidemia who presents for subdural hematoma.  Records and images personally reviewed.  In April, he tripped while intoxicated and fell, reportedly hitting the back of his head on a wooden table.  He had lost consciousness for a short time and was slightly confused immediately upon waking up.  He was experiencing constant dull headache.  He presented to the ED on 06/13/13, where a CT of the head revealed punctate 6 mm subarachnoid hemorrhage in the left frontal parietal sulcus, without mass effect.  CT of the cervical spine revealed no acute abnormalities.  Neurosurgery was consulted, and admission and repeat imaging was not recommended given patient's benign exam and appearance.    For about a year, he has been experiencing dizzy spells.  They are positional, and last just a few seconds.  It is described as a spinning sensation and not associated with nausea. He has had a couple of more falls due to intoxication. He presented to his PCP at the end of May, because the dizziness was worse and he felt nauseous.  He has also been more unsteady on his feet.  MRI of the brain without contrast was performed on 08/19/13, revealing right sided subdural hematoma at the vertex, 6 mm in thickness and with mild regional mass effect with no midline shift.  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    He denies headache, slurred speech, double vision, insomnia or depression.  He does feel a lack of energy.  PAST MEDICAL HISTORY: Past Medical History  Diagnosis Date  . Hypertension   . Hyperlipidemia     PAST SURGICAL HISTORY: Past Surgical  History  Procedure Laterality Date  . Colonoscopy  12/2009    Diverticulosis, mild internal hemorrhoids, multiple colon polyps removed (adenomatous). One irregular shaped polyp was tattooed. Next colonoscopy due October 2016.  . Cardiovascular stress test  2002    negative  . Esophagogastroduodenoscopy N/A 10/08/2012    Procedure: ESOPHAGOGASTRODUODENOSCOPY (EGD);  Surgeon: Danie Binder, MD;  Location: [facility];  Service: Endoscopy;  Laterality: N/A;  12:45    MEDICATIONS: Current Outpatient Prescriptions on File Prior to Visit  Medication Sig Dispense Refill  . lisinopril-hydrochlorothiazide (PRINZIDE,ZESTORETIC) 20-12.5 MG per tablet Take 1 tablet by mouth daily. NEEDS OFFICE VISIT  90 tablet  0  . meclizine (ANTIVERT) 25 MG tablet Take 1 tablet (25 mg total) by mouth every 4 (four) hours as needed for dizziness or nausea.  40 tablet  3  . Multiple Vitamin (MULTIVITAMIN) tablet Take 1 tablet by mouth daily.      . Potassium (POTASSIMIN PO) Take by mouth. OTC  One every day       No current facility-administered medications on file prior to visit.    ALLERGIES: No Known Allergies  FAMILY HISTORY: Family History  Problem Relation Age of Onset  . Diabetes Mother   . Diabetes Maternal Grandfather   . Ulcers Neg Hx   . Colon cancer Neg Hx   . Parkinson's disease Father     SOCIAL HISTORY: History   Social History  . Marital Status: Married    Spouse Name: N/A    Number of Children: N/A  . Years  of Education: N/A   Occupational History  . [employer]    Social History Main Topics  . Smoking status: Current Every Day Smoker -- 1.00 packs/day  . Smokeless tobacco: Not on file     Comment: he would like to but he knows it is hard   . Alcohol Use: Yes     Comment: liquor 5 ounces per night, long-time  . Drug Use: No  . Sexual Activity: No   Other Topics Concern  . Not on file   Social History Narrative  . No narrative on file    REVIEW OF  SYSTEMS: Constitutional: No fevers, chills, or sweats, no generalized fatigue, change in appetite Eyes: No visual changes, double vision, eye pain Ear, nose and throat: No hearing loss, ear pain, nasal congestion, sore throat Cardiovascular: No chest pain, palpitations Respiratory:  No shortness of breath at rest or with exertion, wheezes GastrointestinaI: No nausea, vomiting, diarrhea, abdominal pain, fecal incontinence Genitourinary:  No dysuria, urinary retention or frequency Musculoskeletal:  No neck pain, back pain Integumentary: No rash, pruritus, skin lesions Neurological: as above Psychiatric: No depression, insomnia, anxiety Endocrine: No palpitations, fatigue, diaphoresis, mood swings, change in appetite, change in weight, increased thirst Hematologic/Lymphatic:  No anemia, purpura, petechiae. Allergic/Immunologic: no itchy/runny eyes, nasal congestion, recent allergic reactions, rashes  PHYSICAL EXAM: Filed Vitals:   09/03/13 1424  BP: 118/68  Pulse: 66  Temp: 98 F (36.7 C)  Resp: 18   General: No acute distress Head:  Normocephalic/atraumatic Neck: supple, no paraspinal tenderness, full range of motion Back: No paraspinal tenderness Heart: regular rate and rhythm Lungs: Clear to auscultation bilaterally. Vascular: No carotid bruits. Neurological Exam: Mental status: alert and oriented to person, place, and time, recent and remote memory intact, fund of knowledge intact, attention and concentration intact, speech fluent and not dysarthric, language intact. Cranial nerves: CN I: not tested CN II: pupils equal, round and reactive to light, visual fields intact, fundi unremarkable, without vessel changes, exudates, hemorrhages or papilledema. CN III, IV, VI:  full range of motion, no nystagmus, no ptosis CN V: facial sensation intact CN VII: upper and lower face symmetric CN VIII: hearing intact CN IX, X: gag intact, uvula midline CN XI: sternocleidomastoid and  trapezius muscles intact CN XII: tongue midline Bulk & Tone: normal, no fasciculations. Motor: 5 out of 5 throughout Sensation: Temperature and vibration intact Deep Tendon Reflexes: 2+ throughout, toes downgoing Finger to nose testing: Very fine postural and kinetic tremor. No dysmetria Heel to shin: No dysmetria Gait: Cautious but normal station and stride. Able to turn and walk in tandem. Romberg negative.  IMPRESSION: Subdural hematoma Benign paroxysmal positional vertigo.  He does seem to have BPPV. However the increased dizziness and unsteadiness is most likely due to a head trauma.  PLAN: He has a followup MRI next month. I will see him back after this is performed In the meantime, he and his wife should look out for him in the signs such as increased sedation, increased headache, nausea. Regarding the incidental finding of the aneurysm, would monitor periodically with repeat MRAs. Would repeat in approximately 6 months.  45 minutes spent with the patient his wife, over 50% spent counseling according care.  Thank you for allowing me to take part in the care of this patient.  Metta Clines, DO  CC:  Sallee Lange, MD

## 2013-09-04 ENCOUNTER — Encounter: Payer: Self-pay | Admitting: Family Medicine

## 2013-09-04 DIAGNOSIS — F1010 Alcohol abuse, uncomplicated: Secondary | ICD-10-CM | POA: Insufficient documentation

## 2013-09-04 DIAGNOSIS — I671 Cerebral aneurysm, nonruptured: Secondary | ICD-10-CM | POA: Insufficient documentation

## 2013-09-04 DIAGNOSIS — S065X9A Traumatic subdural hemorrhage with loss of consciousness of unspecified duration, initial encounter: Secondary | ICD-10-CM | POA: Insufficient documentation

## 2013-09-04 DIAGNOSIS — S065XAA Traumatic subdural hemorrhage with loss of consciousness status unknown, initial encounter: Secondary | ICD-10-CM | POA: Insufficient documentation

## 2013-09-11 ENCOUNTER — Other Ambulatory Visit: Payer: Self-pay | Admitting: Family Medicine

## 2013-09-30 ENCOUNTER — Ambulatory Visit (HOSPITAL_COMMUNITY): Payer: BC Managed Care – PPO

## 2013-10-01 ENCOUNTER — Ambulatory Visit (HOSPITAL_COMMUNITY)
Admission: RE | Admit: 2013-10-01 | Discharge: 2013-10-01 | Disposition: A | Discharge status: Home / Self Care | Payer: BC Managed Care – PPO | Source: Ambulatory Visit | Attending: Family Medicine | Admitting: Family Medicine

## 2013-10-01 DIAGNOSIS — S065X9A Traumatic subdural hemorrhage with loss of consciousness of unspecified duration, initial encounter: Secondary | ICD-10-CM

## 2013-10-01 DIAGNOSIS — S065X0A Traumatic subdural hemorrhage without loss of consciousness, initial encounter: Secondary | ICD-10-CM | POA: Insufficient documentation

## 2013-10-01 DIAGNOSIS — Z09 Encounter for follow-up examination after completed treatment for conditions other than malignant neoplasm: Secondary | ICD-10-CM | POA: Insufficient documentation

## 2013-10-01 DIAGNOSIS — R42 Dizziness and giddiness: Secondary | ICD-10-CM

## 2013-10-01 DIAGNOSIS — X58XXXA Exposure to other specified factors, initial encounter: Secondary | ICD-10-CM | POA: Insufficient documentation

## 2013-10-01 DIAGNOSIS — S065XAA Traumatic subdural hemorrhage with loss of consciousness status unknown, initial encounter: Secondary | ICD-10-CM

## 2013-10-02 NOTE — Progress Notes (Signed)
Patient notified and verbalized understanding of the test results. No further questions. 

## 2013-10-03 ENCOUNTER — Ambulatory Visit (INDEPENDENT_AMBULATORY_CARE_PROVIDER_SITE_OTHER): Payer: BC Managed Care – PPO | Admitting: Neurology

## 2013-10-03 ENCOUNTER — Encounter: Payer: Self-pay | Admitting: Neurology

## 2013-10-03 VITALS — BP 128/64 | HR 66 | Ht 69.29 in | Wt 192.0 lb

## 2013-10-03 DIAGNOSIS — I6200 Nontraumatic subdural hemorrhage, unspecified: Secondary | ICD-10-CM

## 2013-10-03 DIAGNOSIS — S065X9A Traumatic subdural hemorrhage with loss of consciousness of unspecified duration, initial encounter: Secondary | ICD-10-CM

## 2013-10-03 DIAGNOSIS — I671 Cerebral aneurysm, nonruptured: Secondary | ICD-10-CM

## 2013-10-03 DIAGNOSIS — H8110 Benign paroxysmal vertigo, unspecified ear: Secondary | ICD-10-CM

## 2013-10-03 DIAGNOSIS — S065XAA Traumatic subdural hemorrhage with loss of consciousness status unknown, initial encounter: Secondary | ICD-10-CM

## 2013-10-03 DIAGNOSIS — G4762 Sleep related leg cramps: Secondary | ICD-10-CM

## 2013-10-03 NOTE — Addendum Note (Signed)
Addended by: Thurmon Fair on: 10/03/2013 02:20 PM   Modules accepted: Orders
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
## 2013-10-03 NOTE — Patient Instructions (Signed)
1.  For the leg cramps, try tonic water.  If ineffective, call and we can try gabapentin. 2.  We will send you to vestibular rehab for the vertigo 3.  Repeat MRA of the head in 5 months with follow up soon after (to evaluate the aneurysm)

## 2013-10-20 ENCOUNTER — Ambulatory Visit (INDEPENDENT_AMBULATORY_CARE_PROVIDER_SITE_OTHER): Payer: BC Managed Care – PPO | Admitting: Family Medicine

## 2013-10-20 ENCOUNTER — Encounter: Payer: Self-pay | Admitting: Family Medicine

## 2013-10-20 VITALS — BP 130/80 | Ht 70.0 in | Wt 191.4 lb

## 2013-10-20 DIAGNOSIS — R51 Headache: Secondary | ICD-10-CM

## 2013-10-20 DIAGNOSIS — H8110 Benign paroxysmal vertigo, unspecified ear: Secondary | ICD-10-CM

## 2013-10-20 DIAGNOSIS — H8113 Benign paroxysmal vertigo, bilateral: Secondary | ICD-10-CM
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
## 2013-11-04 ENCOUNTER — Encounter: Payer: Self-pay | Admitting: Family Medicine

## 2013-11-04 ENCOUNTER — Ambulatory Visit (INDEPENDENT_AMBULATORY_CARE_PROVIDER_SITE_OTHER): Payer: BC Managed Care – PPO | Admitting: Family Medicine

## 2013-11-04 VITALS — BP 138/78 | Ht 68.5 in | Wt 190.0 lb

## 2013-11-04 DIAGNOSIS — Z0000 Encounter for general adult medical examination without abnormal findings: Secondary | ICD-10-CM

## 2013-11-04 MED ORDER — MOMETASONE FUROATE 0.1 % EX CREA: TOPICAL_CREAM | CUTANEOUS | Status: DC
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
## 2013-11-04 NOTE — Patient Instructions (Signed)
DASH Eating Plan °DASH stands for "Dietary Approaches to Stop Hypertension." The DASH eating plan is a healthy eating plan that has been shown to reduce high blood pressure (hypertension). Additional health benefits may include reducing the risk of type 2 diabetes mellitus, heart disease, and stroke. The DASH eating plan may also help with weight loss. °WHAT DO I NEED TO KNOW ABOUT THE DASH EATING PLAN? °For the DASH eating plan, you will follow these general guidelines: °· Choose foods with a percent daily value for sodium of less than 5% (as listed on the food label). °· Use salt-free seasonings or herbs instead of table salt or sea salt. °· Check with your health care provider or pharmacist before using salt substitutes. °· Eat lower-sodium products, often labeled as "lower sodium" or "no salt added." °· Eat fresh foods. °· Eat more vegetables, fruits, and low-fat dairy products. °· Choose whole grains. Look for the word "whole" as the first word in the ingredient list. °· Choose fish and skinless chicken or turkey more often than red meat. Limit fish, poultry, and meat to 6 oz (170 g) each day. °· Limit sweets, desserts, sugars, and sugary drinks. °· Choose heart-healthy fats. °· Limit cheese to 1 oz (28 g) per day. °· Eat more home-cooked food and less restaurant, buffet, and fast food. °· Limit fried foods. °· Cook foods using methods other than frying. °· Limit canned vegetables. If you do use them, rinse them well to decrease the sodium. °· When eating at a restaurant, ask that your food be prepared with less salt, or no salt if possible. °WHAT FOODS CAN I EAT? °Seek help from a dietitian for individual calorie needs. °Grains °Whole grain or whole wheat bread. Brown rice. Whole grain or whole wheat pasta. Quinoa, bulgur, and whole grain cereals. Low-sodium cereals. Corn or whole wheat flour tortillas. Whole grain cornbread. Whole grain crackers. Low-sodium crackers. °Vegetables °Fresh or frozen vegetables  (raw, steamed, roasted, or grilled). Low-sodium or reduced-sodium tomato and vegetable juices. Low-sodium or reduced-sodium tomato sauce and paste. Low-sodium or reduced-sodium canned vegetables.  °Fruits °All fresh, canned (in natural juice), or frozen fruits. °Meat and Other Protein Products °Ground beef (85% or leaner), grass-fed beef, or beef trimmed of fat. Skinless chicken or turkey. Ground chicken or turkey. Pork trimmed of fat. All fish and seafood. Eggs. Dried beans, peas, or lentils. Unsalted nuts and seeds. Unsalted canned beans. °Dairy °Low-fat dairy products, such as skim or 1% milk, 2% or reduced-fat cheeses, low-fat ricotta or cottage cheese, or plain low-fat yogurt. Low-sodium or reduced-sodium cheeses. °Fats and Oils °Tub margarines without trans fats. Light or reduced-fat mayonnaise and salad dressings (reduced sodium). Avocado. Safflower, olive, or canola oils. Natural peanut or almond butter. °Other °Unsalted popcorn and pretzels. °The items listed above may not be a complete list of recommended foods or beverages. Contact your dietitian for more options. °WHAT FOODS ARE NOT RECOMMENDED? °Grains °White bread. White pasta. White rice. Refined cornbread. Bagels and croissants. Crackers that contain trans fat. °Vegetables °Creamed or fried vegetables. Vegetables in a cheese sauce. Regular canned vegetables. Regular canned tomato sauce and paste. Regular tomato and vegetable juices. °Fruits °Dried fruits. Canned fruit in light or heavy syrup. Fruit juice. °Meat and Other Protein Products °Fatty cuts of meat. Ribs, chicken wings, bacon, sausage, bologna, salami, chitterlings, fatback, hot dogs, bratwurst, and packaged luncheon meats. Salted nuts and seeds. Canned beans with salt. °Dairy °Whole or 2% milk, cream, half-and-half, and cream cheese. Whole-fat or sweetened yogurt. Full-fat   cheeses or blue cheese. Nondairy creamers and whipped toppings. Processed cheese, cheese spreads, or cheese  curds. °Condiments °Onion and garlic salt, seasoned salt, table salt, and sea salt. Canned and packaged gravies. Worcestershire sauce. Tartar sauce. Barbecue sauce. Teriyaki sauce. Soy sauce, including reduced sodium. Steak sauce. Fish sauce. Oyster sauce. Cocktail sauce. Horseradish. Ketchup and mustard. Meat flavorings and tenderizers. Bouillon cubes. Hot sauce. Tabasco sauce. Marinades. Taco seasonings. Relishes. °Fats and Oils °Butter, stick margarine, lard, shortening, ghee, and bacon fat. Coconut, palm kernel, or palm oils. Regular salad dressings. °Other °Pickles and olives. Salted popcorn and pretzels. °The items listed above may not be a complete list of foods and beverages to avoid. Contact your dietitian for more information. °WHERE CAN I FIND MORE INFORMATION? °National Heart, Lung, and [employer] Institute: www.nhlbi.nih.gov/health/health-topics/topics/dash/ °Document Released: 02/16/2011 Document Revised: 07/14/2013 Document Reviewed: 01/01/2013 °ExitCare® Patient Information ©2015 [employer]. This information is not intended to replace advice given to you by your health care provider. Make sure you discuss any questions you have with your health care provider. ° °

## 2014-02-02 ENCOUNTER — Other Ambulatory Visit: Payer: Self-pay | Admitting: Family Medicine

## 2014-02-13 ENCOUNTER — Telehealth: Payer: Self-pay | Admitting: Neurology

## 2014-02-13 NOTE — Telephone Encounter (Signed)
Pt canceled 03/09/14 f/u appt. Pt did not give reason.

## 2014-02-17 ENCOUNTER — Ambulatory Visit (HOSPITAL_COMMUNITY): Admission: RE | Admit: 2014-02-17 | Payer: BC Managed Care – PPO | Source: Ambulatory Visit

## 2014-03-09 ENCOUNTER — Ambulatory Visit: Payer: BC Managed Care – PPO | Admitting: Neurology

## 2014-04-14 ENCOUNTER — Other Ambulatory Visit: Payer: Self-pay | Admitting: Family Medicine

## 2014-04-22 ENCOUNTER — Telehealth: Payer: Self-pay | Admitting: Nurse Practitioner

## 2014-04-22 NOTE — Telephone Encounter (Signed)
They just sent in refill of BP med. Dr. Nicki Reaper wants to see him for office visit. I know we spoke about this but forgot what she needed. Thanks.

## 2014-04-22 NOTE — Telephone Encounter (Signed)
Left message to return call 

## 2014-04-22 NOTE — Telephone Encounter (Signed)
Patient was to call back and let us know a pharmacy that he is using now.  He is using [facility].

## 2014-04-24 NOTE — Telephone Encounter (Signed)
Left message to return call 

## 2014-04-24 NOTE — Telephone Encounter (Signed)
Pt will call to schedule OV

## 2014-05-22 ENCOUNTER — Ambulatory Visit: Payer: Self-pay | Admitting: Family Medicine

## 2014-06-08 ENCOUNTER — Encounter: Payer: Self-pay | Admitting: Family Medicine

## 2014-06-08 ENCOUNTER — Ambulatory Visit (INDEPENDENT_AMBULATORY_CARE_PROVIDER_SITE_OTHER): Payer: BLUE CROSS/BLUE SHIELD | Admitting: Family Medicine

## 2014-06-08 VITALS — BP 138/72 | Ht 68.5 in | Wt 198.6 lb

## 2014-06-08 DIAGNOSIS — I671 Cerebral aneurysm, nonruptured: Secondary | ICD-10-CM

## 2014-06-08 DIAGNOSIS — I10 Essential (primary) hypertension: Secondary | ICD-10-CM | POA: Diagnosis not present

## 2014-06-08 DIAGNOSIS — H8110 Benign paroxysmal vertigo, unspecified ear: Secondary | ICD-10-CM

## 2014-06-08 MED ORDER — LISINOPRIL-HYDROCHLOROTHIAZIDE 20-12.5 MG PO TABS: 1.0000 | ORAL_TABLET | Freq: Every day | ORAL | Status: DC

## 2014-06-08 NOTE — Progress Notes (Addendum)
   Subjective:    Patient ID: Anthony Little, male    DOB: 03/21/1950, 64 y.o.   MRN: [id number]  Hypertension This is a chronic problem. The current episode started more than 1 year ago. Pertinent negatives include no chest pain. Risk factors for coronary artery disease include male gender. Treatments tried: lisinopril/hctz. There are no compliance problems.    History of vertigo-ok unless looking up The patient relates whenever he looks up he gets dizzy. He states he feels slightly off balance with it denies nausea vomiting double vision. Denies any unilateral numbness or weakness. In addition to that the patient does relate no headaches. No nausea or vomiting. Patient is trying to eat healthy he had lab work done and his workplace for wellness when he gets the results he will fax them to [place].  The patient does smoking knows he needs to quit we counseled him regarding this he is interested in using Chantix again. We will send this in for him.  Patient had a MRA in the past which showed the possibility of a small aneurysm it was recommended for him to have a repeat scan done we will set this up in May along with a carotid ultrasound that will be one year out from the previous testing.  Review of Systems  Constitutional: Negative for activity change, appetite change and fatigue.  HENT: Negative for congestion.   Respiratory: Negative for cough.   Cardiovascular: Negative for chest pain.  Gastrointestinal: Negative for abdominal pain.  Endocrine: Negative for polydipsia and polyphagia.  Neurological: Negative for weakness.  Psychiatric/Behavioral: Negative for confusion.       Objective:   Physical Exam  Constitutional: He appears well-nourished. No distress.  Cardiovascular: Normal rate, regular rhythm and normal heart sounds.   No murmur heard. Pulmonary/Chest: Effort normal and breath sounds normal. No respiratory distress.  Musculoskeletal: He exhibits no edema.    Lymphadenopathy:    He has no cervical adenopathy.  Neurological: He is alert.  Psychiatric: His behavior is normal.  Vitals reviewed.         Assessment & Plan:  1. Essential hypertension Blood pressure overall doing well continue current medication watch diet quit smoking stay physically active Patient relates that he recently did wellness blood work he will fax [place] the results 2. Benign paroxysmal positional vertigo, unspecified laterality Patient gets dizzy whenever he looks upward. I would recommend for this patient to be careful with his positional changes of his head and recommended he follow-up if ongoing trouble because of the dizziness I do believe it would be wise to do carotid Dopplers. He has a MRA coming up in May he can do the ultrasound at the same time. He should consider doing 81 mg aspirin to lessen the risk of strokes and heart attack.  3. Aneurysm, cerebral, nonruptured We will set the patient up for MRA in May. If there is significant changes or problems he will need referral to neurosurgery. Patient denies any headaches.   Smoking cessation patient was counseled to quit smoking. Recommend Chantix he has been warned if any side effects with it to stop the medication.

## 2014-06-08 NOTE — Patient Instructions (Signed)
DASH Eating Plan °DASH stands for "Dietary Approaches to Stop Hypertension." The DASH eating plan is a healthy eating plan that has been shown to reduce high blood pressure (hypertension). Additional health benefits may include reducing the risk of type 2 diabetes mellitus, heart disease, and stroke. The DASH eating plan may also help with weight loss. °WHAT DO I NEED TO KNOW ABOUT THE DASH EATING PLAN? °For the DASH eating plan, you will follow these general guidelines: °· Choose foods with a percent daily value for sodium of less than 5% (as listed on the food label). °· Use salt-free seasonings or herbs instead of table salt or sea salt. °· Check with your health care provider or pharmacist before using salt substitutes. °· Eat lower-sodium products, often labeled as "lower sodium" or "no salt added." °· Eat fresh foods. °· Eat more vegetables, fruits, and low-fat dairy products. °· Choose whole grains. Look for the word "whole" as the first word in the ingredient list. °· Choose fish and skinless chicken or turkey more often than red meat. Limit fish, poultry, and meat to 6 oz (170 g) each day. °· Limit sweets, desserts, sugars, and sugary drinks. °· Choose heart-healthy fats. °· Limit cheese to 1 oz (28 g) per day. °· Eat more home-cooked food and less restaurant, buffet, and fast food. °· Limit fried foods. °· Cook foods using methods other than frying. °· Limit canned vegetables. If you do use them, rinse them well to decrease the sodium. °· When eating at a restaurant, ask that your food be prepared with less salt, or no salt if possible. °WHAT FOODS CAN I EAT? °Seek help from a dietitian for individual calorie needs. °Grains °Whole grain or whole wheat bread. Brown rice. Whole grain or whole wheat pasta. Quinoa, bulgur, and whole grain cereals. Low-sodium cereals. Corn or whole wheat flour tortillas. Whole grain cornbread. Whole grain crackers. Low-sodium crackers. °Vegetables °Fresh or frozen vegetables  (raw, steamed, roasted, or grilled). Low-sodium or reduced-sodium tomato and vegetable juices. Low-sodium or reduced-sodium tomato sauce and paste. Low-sodium or reduced-sodium canned vegetables.  °Fruits °All fresh, canned (in natural juice), or frozen fruits. °Meat and Other Protein Products °Ground beef (85% or leaner), grass-fed beef, or beef trimmed of fat. Skinless chicken or turkey. Ground chicken or turkey. Pork trimmed of fat. All fish and seafood. Eggs. Dried beans, peas, or lentils. Unsalted nuts and seeds. Unsalted canned beans. °Dairy °Low-fat dairy products, such as skim or 1% milk, 2% or reduced-fat cheeses, low-fat ricotta or cottage cheese, or plain low-fat yogurt. Low-sodium or reduced-sodium cheeses. °Fats and Oils °Tub margarines without trans fats. Light or reduced-fat mayonnaise and salad dressings (reduced sodium). Avocado. Safflower, olive, or canola oils. Natural peanut or almond butter. °Other °Unsalted popcorn and pretzels. °The items listed above may not be a complete list of recommended foods or beverages. Contact your dietitian for more options. °WHAT FOODS ARE NOT RECOMMENDED? °Grains °White bread. White pasta. White rice. Refined cornbread. Bagels and croissants. Crackers that contain trans fat. °Vegetables °Creamed or fried vegetables. Vegetables in a cheese sauce. Regular canned vegetables. Regular canned tomato sauce and paste. Regular tomato and vegetable juices. °Fruits °Dried fruits. Canned fruit in light or heavy syrup. Fruit juice. °Meat and Other Protein Products °Fatty cuts of meat. Ribs, chicken wings, bacon, sausage, bologna, salami, chitterlings, fatback, hot dogs, bratwurst, and packaged luncheon meats. Salted nuts and seeds. Canned beans with salt. °Dairy °Whole or 2% milk, cream, half-and-half, and cream cheese. Whole-fat or sweetened yogurt. Full-fat   cheeses or blue cheese. Nondairy creamers and whipped toppings. Processed cheese, cheese spreads, or cheese  curds. °Condiments °Onion and garlic salt, seasoned salt, table salt, and sea salt. Canned and packaged gravies. Worcestershire sauce. Tartar sauce. Barbecue sauce. Teriyaki sauce. Soy sauce, including reduced sodium. Steak sauce. Fish sauce. Oyster sauce. Cocktail sauce. Horseradish. Ketchup and mustard. Meat flavorings and tenderizers. Bouillon cubes. Hot sauce. Tabasco sauce. Marinades. Taco seasonings. Relishes. °Fats and Oils °Butter, stick margarine, lard, shortening, ghee, and bacon fat. Coconut, palm kernel, or palm oils. Regular salad dressings. °Other °Pickles and olives. Salted popcorn and pretzels. °The items listed above may not be a complete list of foods and beverages to avoid. Contact your dietitian for more information. °WHERE CAN I FIND MORE INFORMATION? °National Heart, Lung, and [employer] Institute: www.nhlbi.nih.gov/health/health-topics/topics/dash/ °Document Released: 02/16/2011 Document Revised: 07/14/2013 Document Reviewed: 01/01/2013 °ExitCare® Patient Information ©2015 [employer]. This information is not intended to replace advice given to you by your health care provider. Make sure you discuss any questions you have with your health care provider. ° °

## 2014-06-08 NOTE — Addendum Note (Signed)
Addended by: Sallee Lange A on: 06/08/2014 09:11 PM   Modules accepted: Level of Service

## 2014-06-11 ENCOUNTER — Other Ambulatory Visit: Payer: Self-pay | Admitting: *Deleted

## 2014-06-11 MED ORDER — VARENICLINE TARTRATE 0.5 MG X 11 & 1 MG X 42 PO MISC: ORAL | Status: DC

## 2014-06-11 MED ORDER — VARENICLINE TARTRATE 1 MG PO TABS: 1.0000 mg | ORAL_TABLET | Freq: Two times a day (BID) | ORAL | Status: DC

## 2014-06-17 ENCOUNTER — Telehealth: Payer: Self-pay | Admitting: Family Medicine

## 2014-06-17 NOTE — Telephone Encounter (Signed)
Please send letter to the patient stating that his lab work overall looked good. He is to follow-up in 6 months. Lab work will be scanned into the system.

## 2014-08-07 ENCOUNTER — Telehealth: Payer: Self-pay | Admitting: *Deleted

## 2014-08-07 NOTE — Telephone Encounter (Signed)
It would be fine for the patient to get his test through the neurologist. What they are ordering is appropriate. May cancel what we have written in regards to the tickler file

## 2014-08-07 NOTE — Telephone Encounter (Signed)
Patient stated he will contact Dr Georgie Chard office to have the MRI/MRA scheduled and also schedule a follow up office visit with specialist after the scan as advised.

## 2014-08-07 NOTE — Telephone Encounter (Signed)
Patient has a order for repeat MRI/MRA brain ordered by a Dr Tomi Likens -we have in [place] file to repeat MRA and Carotid ultrasound in May 2016

## 2014-08-07 NOTE — Telephone Encounter (Signed)
Pt in [place] file for may 2016 to have repeat MRA for aneursym and carotid u/s for dizziness

## 2014-09-15 ENCOUNTER — Ambulatory Visit (INDEPENDENT_AMBULATORY_CARE_PROVIDER_SITE_OTHER): Payer: BLUE CROSS/BLUE SHIELD | Admitting: Family Medicine

## 2014-09-15 ENCOUNTER — Encounter: Payer: Self-pay | Admitting: Family Medicine

## 2014-09-15 VITALS — BP 130/90 | Temp 98.2°F | Ht 68.5 in | Wt 195.4 lb

## 2014-09-15 DIAGNOSIS — I10 Essential (primary) hypertension: Secondary | ICD-10-CM

## 2014-09-15 DIAGNOSIS — R270 Ataxia, unspecified: Secondary | ICD-10-CM | POA: Diagnosis not present

## 2014-09-15 DIAGNOSIS — Z79899 Other long term (current) drug therapy: Secondary | ICD-10-CM

## 2014-09-15 DIAGNOSIS — R358 Other polyuria: Secondary | ICD-10-CM | POA: Diagnosis not present

## 2014-09-15 DIAGNOSIS — D6489 Other specified anemias: Secondary | ICD-10-CM

## 2014-09-15 DIAGNOSIS — R3589 Other polyuria: Secondary | ICD-10-CM

## 2014-09-15 DIAGNOSIS — Z125 Encounter for screening for malignant neoplasm of prostate: Secondary | ICD-10-CM

## 2014-09-15 DIAGNOSIS — E875 Hyperkalemia: Secondary | ICD-10-CM

## 2014-09-15 DIAGNOSIS — I951 Orthostatic hypotension: Secondary | ICD-10-CM

## 2014-09-15 LAB — POCT URINALYSIS DIPSTICK
PH UA: 6
Spec Grav, UA: 1.015

## 2014-09-15 MED ORDER — LISINOPRIL-HYDROCHLOROTHIAZIDE 10-12.5 MG PO TABS: 1.0000 | ORAL_TABLET | Freq: Every day | ORAL | Status: DC

## 2014-09-15 NOTE — Progress Notes (Signed)
   Subjective:    Patient ID: Anthony Little, male    DOB: 1950-05-21, 64 y.o.   MRN: [id number]  Dizziness This is a new problem. The current episode started more than 1 year ago. The problem occurs intermittently. The problem has been unchanged. Associated symptoms comments: Bloating, back pain. Nothing aggravates the symptoms. He has tried nothing for the symptoms. The treatment provided no relief.   Patient states that he has no other concerns at this time.  Last week was walking and seemingly passed out happened within 10 sec of getting up from chair  LOC was minimal (2 sec) BP was low at that time 96/62  Nutrition good Also some back pain Pt relates feeling bloated a lot So this patient did have a brief LOC after standing up quickly I believe that his blood pressure dropped he immediately woke up and was able to be a wet alert and aware I don't find any evidence of seizure or stroke currently patient does have an area on his brain that was concerning for the possibility of aneurysm is recommended repeat MRI MRA but he never got this done  Relates episodes of vertigo  Review of Systems  Neurological: Positive for dizziness.   He denies chest tightness pressure pain headaches nausea vomiting diarrhea    Objective:   Physical Exam  Lungs clear hearts regular pulses normal blood pressure laying sitting standing reveals mild orthostatic hypotension Romberg negative finger to nose is normal      Assessment & Plan:  Pt will call with MRI place of choicethis patient had abnormal MRI in the past is recommended to repeat this MRI/MRA this was by the recommendation of the specialist Dr. Sherwood Gambler who is a Publishing rights manager. The patient understands that he is supposed to have this completed.  Mild orthostatic hypotension reduce blood pressure medicine follow-up again in 3 months time sooner if any problems warning signs were discussed  I don't find any evidence of ongoing stroke  The  patient is due his standard blood work as well to rule out prostate issues plus also check kidney function sodium-potassium and hemoglobin. In addition to this prostate exam today was normal

## 2014-10-03 LAB — CBC WITH DIFFERENTIAL/PLATELET
Basophils Absolute: 0.1 10*3/uL (ref 0.0–0.2)
Basos: 1 %
EOS (ABSOLUTE): 0.3 10*3/uL (ref 0.0–0.4)
EOS: 4 %
HEMATOCRIT: 37.7 % (ref 37.5–51.0)
Hemoglobin: 12.6 g/dL (ref 12.6–17.7)
Immature Grans (Abs): 0 10*3/uL (ref 0.0–0.1)
Immature Granulocytes: 0 %
LYMPHS: 21 %
Lymphocytes Absolute: 1.7 10*3/uL (ref 0.7–3.1)
MCH: 34.5 pg — ABNORMAL HIGH (ref 26.6–33.0)
MCHC: 33.4 g/dL (ref 31.5–35.7)
MCV: 103 fL — ABNORMAL HIGH (ref 79–97)
MONOCYTES: 7 %
Monocytes Absolute: 0.6 10*3/uL (ref 0.1–0.9)
NEUTROS ABS: 5.5 10*3/uL (ref 1.4–7.0)
Neutrophils: 67 %
Platelets: 245 10*3/uL (ref 150–379)
RBC: 3.65 x10E6/uL — ABNORMAL LOW (ref 4.14–5.80)
RDW: 13 % (ref 12.3–15.4)
WBC: 8.1 10*3/uL (ref 3.4–10.8)

## 2014-10-03 LAB — BASIC METABOLIC PANEL
BUN/Creatinine Ratio: 21 (ref 10–22)
BUN: 44 mg/dL — ABNORMAL HIGH (ref 8–27)
CHLORIDE: 96 mmol/L — AB (ref 97–108)
CO2: 23 mmol/L (ref 18–29)
Calcium: 9.1 mg/dL (ref 8.6–10.2)
Creatinine, Ser: 2.1 mg/dL — ABNORMAL HIGH (ref 0.76–1.27)
GFR calc Af Amer: 38 mL/min/{1.73_m2} — ABNORMAL LOW (ref >59)
GFR calc non Af Amer: 33 mL/min/{1.73_m2} — ABNORMAL LOW (ref >59)
GLUCOSE: 109 mg/dL — AB (ref 65–99)
Potassium: 6.5 mmol/L — ABNORMAL HIGH (ref 3.5–5.2)
SODIUM: 136 mmol/L (ref 134–144)

## 2014-10-03 LAB — PSA: Prostate Specific Ag, Serum: 0.9 ng/mL (ref 0.0–4.0)

## 2014-10-07 ENCOUNTER — Other Ambulatory Visit: Payer: Self-pay | Admitting: *Deleted

## 2014-10-07 DIAGNOSIS — R7989 Other specified abnormal findings of blood chemistry: Secondary | ICD-10-CM

## 2014-10-07 DIAGNOSIS — Z79899 Other long term (current) drug therapy: Secondary | ICD-10-CM

## 2014-10-07 MED ORDER — AMLODIPINE BESYLATE 5 MG PO TABS: 5.0000 mg | ORAL_TABLET | Freq: Every day | ORAL | Status: DC

## 2014-10-22 ENCOUNTER — Encounter: Payer: Self-pay | Admitting: Family Medicine

## 2014-10-22 LAB — BASIC METABOLIC PANEL
BUN/Creatinine Ratio: 17 (ref 10–22)
BUN: 21 mg/dL (ref 8–27)
CO2: 22 mmol/L (ref 18–29)
Calcium: 8.9 mg/dL (ref 8.6–10.2)
Chloride: 103 mmol/L (ref 97–108)
Creatinine, Ser: 1.26 mg/dL (ref 0.76–1.27)
GFR, EST AFRICAN AMERICAN: 70 mL/min/{1.73_m2} (ref >59)
GFR, EST NON AFRICAN AMERICAN: 60 mL/min/{1.73_m2} (ref >59)
GLUCOSE: 107 mg/dL — AB (ref 65–99)
Potassium: 4.9 mmol/L (ref 3.5–5.2)
Sodium: 142 mmol/L (ref 134–144)

## 2014-11-18 ENCOUNTER — Encounter: Payer: Self-pay | Admitting: Gastroenterology

## 2014-12-08 ENCOUNTER — Ambulatory Visit: Payer: BLUE CROSS/BLUE SHIELD | Admitting: Family Medicine

## 2014-12-16 ENCOUNTER — Ambulatory Visit: Payer: BLUE CROSS/BLUE SHIELD | Admitting: Family Medicine

## 2014-12-29 ENCOUNTER — Other Ambulatory Visit: Payer: Self-pay | Admitting: Family Medicine

## 2014-12-31 ENCOUNTER — Other Ambulatory Visit: Payer: Self-pay

## 2014-12-31 MED ORDER — VARENICLINE TARTRATE 1 MG PO TABS: 1.0000 mg | ORAL_TABLET | Freq: Two times a day (BID) | ORAL | Status: DC

## 2014-12-31 NOTE — Telephone Encounter (Signed)
If patient is going to start Chantix again he will need this but he will also need to do 2 maintenance packs afterwards. Also he needs to be advised if it causes him to feel depressed or suicidal to stop the medicine

## 2015-05-15 ENCOUNTER — Other Ambulatory Visit: Payer: Self-pay | Admitting: Family Medicine

## 2015-06-29 ENCOUNTER — Ambulatory Visit: Payer: BLUE CROSS/BLUE SHIELD | Admitting: Family Medicine

## 2015-07-08 ENCOUNTER — Encounter: Payer: Self-pay | Admitting: Family Medicine

## 2015-07-08 ENCOUNTER — Ambulatory Visit (INDEPENDENT_AMBULATORY_CARE_PROVIDER_SITE_OTHER): Payer: BLUE CROSS/BLUE SHIELD | Admitting: Family Medicine

## 2015-07-08 ENCOUNTER — Other Ambulatory Visit: Payer: Self-pay | Admitting: Family Medicine

## 2015-07-08 VITALS — BP 142/90 | Temp 98.6°F | Ht 68.5 in | Wt 214.0 lb

## 2015-07-08 DIAGNOSIS — M1712 Unilateral primary osteoarthritis, left knee: Secondary | ICD-10-CM

## 2015-07-08 DIAGNOSIS — R6881 Early satiety: Secondary | ICD-10-CM | POA: Diagnosis not present

## 2015-07-08 DIAGNOSIS — R748 Abnormal levels of other serum enzymes: Secondary | ICD-10-CM

## 2015-07-08 DIAGNOSIS — R140 Abdominal distension (gaseous): Secondary | ICD-10-CM

## 2015-07-08 DIAGNOSIS — M1711 Unilateral primary osteoarthritis, right knee: Secondary | ICD-10-CM | POA: Diagnosis not present

## 2015-07-08 DIAGNOSIS — Z139 Encounter for screening, unspecified: Secondary | ICD-10-CM

## 2015-07-08 DIAGNOSIS — L719 Rosacea, unspecified: Secondary | ICD-10-CM

## 2015-07-08 MED ORDER — DOXYCYCLINE HYCLATE 100 MG PO TABS: 100.0000 mg | ORAL_TABLET | Freq: Two times a day (BID) | ORAL | Status: DC

## 2015-07-08 NOTE — Progress Notes (Signed)
   Subjective:    Patient ID: Anthony Little, male    DOB: 20-Feb-1951, 65 y.o.   MRN: [id number]  HPIFollow up Right foot/ankle swelling and rash. Has a rational in his lower right leg  Bilateral knee pain. Trouble getting up after getting down on knees.He relates pain when he gets on his knees in weakness when he tries to get up. This been going on for weeks does not get a lot of exercise other than walking at work   Pt states he eats just a small amount and feels bloated. He states it is small amount for most of his meals and feels full this is a change for him. He denies any weight loss. Denies abdominal pain. Does have a history in the past of smoking although he has quit also a history of alcohol  Acne on nose. At times this is been inflamed tender painful other times is just red in pimples on his nose this been present more so in the past several months.    Review of Systems He denies weight loss rectal bleeding vomiting diarrhea sweats chills fever abdominal pain chest pain    Objective:   Physical Exam Osteoarthritis noted in both knees some weakness in the quadriceps probably related to lack of exercise of those muscles Abdomen no tenderness guarding or rebound no masses felt Acne rosacea noted on the nose Chronic venous stasis dermatitis noted in the lower legs  Patient has quit smoking.       Assessment & Plan:  Abdominal bloating with early satiety we will do lab work first may need refer to GI or possible EGD patient does have a history of smoking and alcohol in the past he denies any significant weight loss denies abdominal pain nothing wakes him up at night no rectal bleeding no hematuria therefore again with lab work may need additional studies possibly EGD  Acne rosacea doxycycline twice a day for the next month then once daily thereafter if this does not help enough referral to dermatology  Venous stasis chronic skin changes-steroid cream as directed. Monitor  closely will go ahead and do skin culture for fungal culture.  Osteoarthritis of both knees I talked with him about strengthening of the quadriceps hopefully this will help as well

## 2015-07-09 LAB — CBC WITH DIFFERENTIAL/PLATELET
BASOS ABS: 0.1 10*3/uL (ref 0.0–0.2)
Basos: 1 %
EOS (ABSOLUTE): 0.3 10*3/uL (ref 0.0–0.4)
Eos: 4 %
HEMOGLOBIN: 14.1 g/dL (ref 12.6–17.7)
Hematocrit: 43.2 % (ref 37.5–51.0)
Immature Grans (Abs): 0 10*3/uL (ref 0.0–0.1)
Immature Granulocytes: 0 %
LYMPHS ABS: 1.6 10*3/uL (ref 0.7–3.1)
Lymphs: 21 %
MCH: 33.3 pg — AB (ref 26.6–33.0)
MCHC: 32.6 g/dL (ref 31.5–35.7)
MCV: 102 fL — ABNORMAL HIGH (ref 79–97)
MONOCYTES: 9 %
MONOS ABS: 0.7 10*3/uL (ref 0.1–0.9)
NEUTROS ABS: 5 10*3/uL (ref 1.4–7.0)
Neutrophils: 65 %
Platelets: 297 10*3/uL (ref 150–379)
RBC: 4.24 x10E6/uL (ref 4.14–5.80)
RDW: 14.4 % (ref 12.3–15.4)
WBC: 7.5 10*3/uL (ref 3.4–10.8)

## 2015-07-09 LAB — BASIC METABOLIC PANEL
BUN / CREAT RATIO: 18 (ref 10–24)
BUN: 22 mg/dL (ref 8–27)
CHLORIDE: 101 mmol/L (ref 96–106)
CO2: 23 mmol/L (ref 18–29)
CREATININE: 1.23 mg/dL (ref 0.76–1.27)
Calcium: 9.2 mg/dL (ref 8.6–10.2)
GFR calc Af Amer: 72 mL/min/{1.73_m2} (ref >59)
GFR calc non Af Amer: 62 mL/min/{1.73_m2} (ref >59)
Glucose: 96 mg/dL (ref 65–99)
Potassium: 5.6 mmol/L — ABNORMAL HIGH (ref 3.5–5.2)
SODIUM: 141 mmol/L (ref 134–144)

## 2015-07-09 LAB — LIPASE: LIPASE: 73 U/L — AB (ref 0–59)

## 2015-07-09 LAB — HEPATIC FUNCTION PANEL
ALK PHOS: 55 IU/L (ref 39–117)
ALT: 10 IU/L (ref 0–44)
AST: 19 IU/L (ref 0–40)
Albumin: 4.2 g/dL (ref 3.6–4.8)
Bilirubin Total: 0.3 mg/dL (ref 0.0–1.2)
Bilirubin, Direct: 0.11 mg/dL (ref 0.00–0.40)
TOTAL PROTEIN: 7.6 g/dL (ref 6.0–8.5)

## 2015-07-12 LAB — SPECIMEN STATUS REPORT

## 2015-07-13 NOTE — Addendum Note (Signed)
Addended by: Ofilia Neas R on: 07/13/2015 10:56 AM   Modules accepted: Orders

## 2015-07-15 ENCOUNTER — Other Ambulatory Visit: Payer: Self-pay | Admitting: Family Medicine

## 2015-07-15 ENCOUNTER — Other Ambulatory Visit: Payer: Self-pay | Admitting: *Deleted

## 2015-07-15 DIAGNOSIS — B49 Unspecified mycosis: Secondary | ICD-10-CM

## 2015-07-19 ENCOUNTER — Other Ambulatory Visit: Payer: Self-pay

## 2015-07-19 DIAGNOSIS — R140 Abdominal distension (gaseous): Secondary | ICD-10-CM

## 2015-07-19 DIAGNOSIS — R748 Abnormal levels of other serum enzymes: Secondary | ICD-10-CM

## 2015-07-19 DIAGNOSIS — R6881 Early satiety: Secondary | ICD-10-CM

## 2015-07-22 ENCOUNTER — Other Ambulatory Visit: Payer: Self-pay

## 2015-07-22 ENCOUNTER — Ambulatory Visit (HOSPITAL_COMMUNITY)
Admission: RE | Admit: 2015-07-22 | Discharge: 2015-07-22 | Disposition: A | Discharge status: Home / Self Care | Payer: BLUE CROSS/BLUE SHIELD | Source: Ambulatory Visit | Attending: Family Medicine | Admitting: Family Medicine

## 2015-07-22 DIAGNOSIS — R911 Solitary pulmonary nodule: Secondary | ICD-10-CM | POA: Insufficient documentation

## 2015-07-22 DIAGNOSIS — R6881 Early satiety: Secondary | ICD-10-CM

## 2015-07-22 DIAGNOSIS — J432 Centrilobular emphysema: Secondary | ICD-10-CM | POA: Insufficient documentation

## 2015-07-22 DIAGNOSIS — R748 Abnormal levels of other serum enzymes: Secondary | ICD-10-CM | POA: Diagnosis not present

## 2015-07-22 DIAGNOSIS — R140 Abdominal distension (gaseous): Secondary | ICD-10-CM

## 2015-07-22 DIAGNOSIS — I2510 Atherosclerotic heart disease of native coronary artery without angina pectoris: Secondary | ICD-10-CM | POA: Diagnosis not present

## 2015-07-22 MED ORDER — IOPAMIDOL (ISOVUE-300) INJECTION 61%
100.0000 mL | Freq: Once | INTRAVENOUS | Status: AC | PRN
  Administered 2015-07-22: 100 mL via INTRAVENOUS

## 2015-07-28 ENCOUNTER — Ambulatory Visit (INDEPENDENT_AMBULATORY_CARE_PROVIDER_SITE_OTHER): Payer: BLUE CROSS/BLUE SHIELD | Admitting: Family Medicine

## 2015-07-28 DIAGNOSIS — R6881 Early satiety: Secondary | ICD-10-CM

## 2015-07-28 DIAGNOSIS — I10 Essential (primary) hypertension: Secondary | ICD-10-CM | POA: Diagnosis not present

## 2015-07-28 NOTE — Progress Notes (Signed)
   Subjective:    Patient ID: Anthony Little, male    DOB: 1950/07/22, 65 y.o.   MRN: [id number]  HPI  Patient arrives to discuss recent ct scan results. Significant length of time was spent discussing CAT scan results lab results and discussing with the patient how he is doing he still finds when he eats he gets full very quickly and only can eat a small portion He also finds that he has significant abdominal epigastric pressure after eating sometimes causes pain into his back if he overeats. Recent CAT scan results showed a small pulmonary nodule. Also showed pancreas looks good. What appears to be a small kidney cysts. Adrenal glands look good. Also showed atherosclerosis of the aorta as well as coronary artery disease. Review of Systems he does he does have emphysema on CAT scan    he denies any chest tightness pressure pain but he does state that he gets winded with doing physical activity he states when he was carrying a weedeater up the hill he had to stop halfway up many other 6 once he got up the hill because he gave out of energy but denied any chest pressure with it. Objective:   Physical Exam Lungs clear heart regular abdomen soft no guarding rebound or tenderness       Assessment & Plan:  #1 CAT scan findings show small pulmonary nodule will do CT scan of the lungs without contrast in 6 months  #2 coronary artery disease on scan-check lipid profile. Discussed case with cardiology to see if further testing necessary  #3 more than likely has a small renal cyst no further workup necessary  #4 referral to gastroenterology more than likely will need EGD if this does not reveal the issue may need gastric emptying study  #5 DOE-more than likely will need referral to cardiology

## 2015-07-31 ENCOUNTER — Encounter: Payer: Self-pay | Admitting: Family Medicine

## 2015-08-06 ENCOUNTER — Emergency Department (HOSPITAL_COMMUNITY)
Admission: EM | Admit: 2015-08-06 | Discharge: 2015-08-06 | Disposition: A | Discharge status: Home / Self Care | Payer: BLUE CROSS/BLUE SHIELD | Attending: Emergency Medicine | Admitting: Emergency Medicine

## 2015-08-06 ENCOUNTER — Emergency Department (HOSPITAL_COMMUNITY): Payer: BLUE CROSS/BLUE SHIELD

## 2015-08-06 ENCOUNTER — Encounter (HOSPITAL_COMMUNITY): Payer: Self-pay | Admitting: *Deleted

## 2015-08-06 DIAGNOSIS — Z79899 Other long term (current) drug therapy: Secondary | ICD-10-CM | POA: Diagnosis not present

## 2015-08-06 DIAGNOSIS — Z8639 Personal history of other endocrine, nutritional and metabolic disease: Secondary | ICD-10-CM | POA: Diagnosis not present

## 2015-08-06 DIAGNOSIS — I10 Essential (primary) hypertension: Secondary | ICD-10-CM | POA: Diagnosis not present

## 2015-08-06 DIAGNOSIS — Z87891 Personal history of nicotine dependence: Secondary | ICD-10-CM | POA: Insufficient documentation

## 2015-08-06 DIAGNOSIS — H538 Other visual disturbances: Secondary | ICD-10-CM | POA: Insufficient documentation

## 2015-08-06 DIAGNOSIS — H547 Unspecified visual loss: Secondary | ICD-10-CM

## 2015-08-06 DIAGNOSIS — Z792 Long term (current) use of antibiotics: Secondary | ICD-10-CM | POA: Diagnosis not present

## 2015-08-06 DIAGNOSIS — H578 Other specified disorders of eye and adnexa: Secondary | ICD-10-CM | POA: Diagnosis present

## 2015-08-06 DIAGNOSIS — H539 Unspecified visual disturbance: Secondary | ICD-10-CM

## 2015-08-06 LAB — COMPREHENSIVE METABOLIC PANEL
ALBUMIN: 4.2 g/dL (ref 3.5–5.0)
ALT: 12 U/L — ABNORMAL LOW (ref 17–63)
ANION GAP: 9 (ref 5–15)
AST: 18 U/L (ref 15–41)
Alkaline Phosphatase: 52 U/L (ref 38–126)
BUN: 31 mg/dL — ABNORMAL HIGH (ref 6–20)
CHLORIDE: 101 mmol/L (ref 101–111)
CO2: 24 mmol/L (ref 22–32)
Calcium: 9.3 mg/dL (ref 8.9–10.3)
Creatinine, Ser: 1.65 mg/dL — ABNORMAL HIGH (ref 0.61–1.24)
GFR calc Af Amer: 49 mL/min — ABNORMAL LOW (ref >60)
GFR calc non Af Amer: 42 mL/min — ABNORMAL LOW (ref >60)
GLUCOSE: 98 mg/dL (ref 65–99)
POTASSIUM: 4.7 mmol/L (ref 3.5–5.1)
SODIUM: 134 mmol/L — AB (ref 135–145)
Total Bilirubin: 1.1 mg/dL (ref 0.3–1.2)
Total Protein: 7.1 g/dL (ref 6.5–8.1)

## 2015-08-06 LAB — CBC
HEMATOCRIT: 41.9 % (ref 39.0–52.0)
HEMOGLOBIN: 13.6 g/dL (ref 13.0–17.0)
MCH: 33.2 pg (ref 26.0–34.0)
MCHC: 32.5 g/dL (ref 30.0–36.0)
MCV: 102.2 fL — ABNORMAL HIGH (ref 78.0–100.0)
Platelets: 225 10*3/uL (ref 150–400)
RBC: 4.1 MIL/uL — ABNORMAL LOW (ref 4.22–5.81)
RDW: 13.7 % (ref 11.5–15.5)
WBC: 7 10*3/uL (ref 4.0–10.5)

## 2015-08-06 MED ORDER — GADOBENATE DIMEGLUMINE 529 MG/ML IV SOLN
20.0000 mL | Freq: Once | INTRAVENOUS | Status: AC | PRN
  Administered 2015-08-06: 20 mL via INTRAVENOUS

## 2015-08-06 NOTE — ED Notes (Signed)
Patient Alert and oriented X4. Stable and ambulatory. Patient verbalized understanding of the discharge instructions.  Patient belongings were taken by the patient.  

## 2015-08-06 NOTE — ED Provider Notes (Signed)
CSN: [id number]     Arrival date & time 08/06/15  1700 History   First MD Initiated Contact with Patient 08/06/15 1749     Chief Complaint  Patient presents with  . Eye Problem     (Consider location/radiation/quality/duration/timing/severity/associated sxs/prior Treatment) HPI Comments: 65 year old male with history of hypertension, hyperlipidemia presents under the direction of Dr. Manuella Ghazi ophthalmologist for evaluation of vision loss.  The patient states that over the last 2 weeks he has had peripheral vision loss in his left eye. Over the last few days this has gotten worse. He was seen by an optometrist today who sent him to be seen by an ophthalmologist. They were concerned that his optic disc did not look right and wanted him to have an emergent MRI as well as emergent blood work done. He was instructed to go directly to the emergency department to facilitate having these completed. Patient denies any weakness, difficulty walking, headache. Denies any other associated symptoms.   Past Medical History  Diagnosis Date  . Hypertension   . Hyperlipidemia    Past Surgical History  Procedure Laterality Date  . Colonoscopy  12/2009    Diverticulosis, mild internal hemorrhoids, multiple colon polyps removed (adenomatous). One irregular shaped polyp was tattooed. Next colonoscopy due October 2016.  . Cardiovascular stress test  2002    negative  . Esophagogastroduodenoscopy N/A 10/08/2012    Procedure: ESOPHAGOGASTRODUODENOSCOPY (EGD);  Surgeon: Danie Binder, MD;  Location: [facility];  Service: Endoscopy;  Laterality: N/A;  12:45   Family History  Problem Relation Age of Onset  . Diabetes Mother   . Diabetes Maternal Grandfather   . Ulcers Neg Hx   . Colon cancer Neg Hx   . Parkinson's disease Father    Social History  Substance Use Topics  . Smoking status: Former Smoker -- 1.00 packs/day  . Smokeless tobacco: Former Systems developer    Quit date: 01/07/2015     Comment: he would like to  but he knows it is hard   . Alcohol Use: 0.0 oz/week    0 Standard drinks or equivalent per week     Comment: liquor 5 ounces per night, long-time    Review of Systems  Constitutional: Negative for fever, chills, diaphoresis and fatigue.  HENT: Negative for congestion, rhinorrhea and sinus pressure.   Eyes: Positive for visual disturbance.  Respiratory: Negative for cough, chest tightness and shortness of breath.   Cardiovascular: Negative for chest pain and palpitations.  Gastrointestinal: Negative for nausea, vomiting, abdominal pain, diarrhea and constipation.  Genitourinary: Negative for dysuria, urgency and hematuria.  Musculoskeletal: Negative for myalgias, back pain and neck pain.  Skin: Negative for rash.  Neurological: Negative for dizziness, seizures, weakness, numbness and headaches.  Hematological: Does not bruise/bleed easily.      Allergies  Review of patient's allergies indicates no known allergies.  Home Medications   Prior to Admission medications   Medication Sig Start Date End Date Taking? Authorizing Provider  amLODipine (NORVASC) 5 MG tablet TAKE 1 TABLET (5 MG TOTAL) BY MOUTH DAILY. 05/17/15   Mikey Kirschner, MD  doxycycline (VIBRA-TABS) 100 MG tablet Take 1 tablet (100 mg total) by mouth 2 (two) times daily. 07/08/15   Kathyrn Drown, MD   BP 128/71 mmHg  Pulse 63  Temp(Src) 97.6 F (36.4 C) (Oral)  Resp 18  SpO2 100% Physical Exam  Constitutional: He is oriented to person, place, and time. He appears well-developed and well-nourished. No distress.  HENT:  Head:  Normocephalic and atraumatic.  Right Ear: External ear normal.  Left Ear: External ear normal.  Mouth/Throat: Oropharynx is clear and moist. No oropharyngeal exudate.  Eyes: EOM are normal. Pupils are equal, round, and reactive to light.  Decreased visual field in the left eye laterally  Neck: Normal range of motion. Neck supple.  Cardiovascular: Normal rate, regular rhythm, normal heart  sounds and intact distal pulses.   No murmur heard. Pulmonary/Chest: Effort normal. No respiratory distress. He has no wheezes. He has no rales.  Abdominal: Soft. He exhibits no distension. There is no tenderness.  Musculoskeletal: He exhibits no edema.  Neurological: He is alert and oriented to person, place, and time. No cranial nerve deficit. He exhibits normal muscle tone. Coordination normal.  Skin: Skin is warm and dry. No rash noted. He is not diaphoretic.  Vitals reviewed.   ED Course  Procedures (including critical care time) Labs Review Labs Reviewed  COMPREHENSIVE METABOLIC PANEL - Abnormal; Notable for the following:    Sodium 134 (*)    BUN 31 (*)    Creatinine, Ser 1.65 (*)    ALT 12 (*)    GFR calc non Af Amer 42 (*)    GFR calc Af Amer 49 (*)    All other components within normal limits  CBC - Abnormal; Notable for the following:    RBC 4.10 (*)    MCV 102.2 (*)    All other components within normal limits  QUANTIFERON TB GOLD ASSAY (BLOOD)  RPR  TOXOPLASMA GONDII ANTIBODY, IGM  TOXOCARA ANTIBODIES, BLD  BARTONELLA ANITBODY PANEL  ANGIOTENSIN CONVERTING ENZYME  LYSOZYME, SERUM    Imaging Review Mr Kizzie Fantasia Contrast  08/06/2015  CLINICAL DATA:  65 year old hypertensive male with hyperlipidemia presenting with history of loss of vision left thigh 2 days ago. Initial encounter. EXAM: MRI HEAD AND ORBITS WITHOUT AND WITH CONTRAST TECHNIQUE: Multiplanar, multiecho pulse sequences of the brain and surrounding structures were obtained without and with intravenous contrast. Multiplanar, multiecho pulse sequences of the orbits and surrounding structures were obtained including fat saturation techniques, before and after intravenous contrast administration. CONTRAST:  44mL MULTIHANCE GADOBENATE DIMEGLUMINE 529 MG/ML IV SOLN COMPARISON:  10/01/2013 MR. FINDINGS: MRI HEAD FINDINGS Besides the below described questionable punctate infarct of the left optic nerve, no infarct  is noted. Remote infarct left lenticular nucleus/ caudate. Mild to moderate chronic small vessel disease changes. No intracranial hemorrhage. No intracranial mass or abnormal enhancement. Global atrophy without hydrocephalus. Small mega cisterna magna incidentally noted. Major intracranial vascular structures are patent. MRI ORBITS FINDINGS On diffusion sequence, question of punctate infarct involving the left optic nerve. This may be related to artifact rather than infarct. Otherwise, symmetric normal appearance of the optic nerves without abnormal enhancement or signal intensity. No orbital mass noted. Globes appear intact. Minimal mucosal thickening ethmoid sinus air cells. Symmetric normal appearance of the cavernous sinus. IMPRESSION: MRI ORBITS On diffusion sequence, question of punctate infarct involving the left optic nerve. This may be related to artifact rather than infarct. Otherwise, symmetric normal appearance of the optic nerves. MRI HEAD Remote infarct left lenticular nucleus/ caudate. Mild to moderate chronic small vessel disease changes. Global atrophy. Electronically Signed   By: Genia Del M.D.   On: 08/06/2015 20:31   Mr Darnelle Catalan Wo/w Cm  08/06/2015  CLINICAL DATA:  65 year old hypertensive male with hyperlipidemia presenting with history of loss of vision left thigh 2 days ago. Initial encounter. EXAM: MRI HEAD AND ORBITS WITHOUT AND WITH CONTRAST  TECHNIQUE: Multiplanar, multiecho pulse sequences of the brain and surrounding structures were obtained without and with intravenous contrast. Multiplanar, multiecho pulse sequences of the orbits and surrounding structures were obtained including fat saturation techniques, before and after intravenous contrast administration. CONTRAST:  38mL MULTIHANCE GADOBENATE DIMEGLUMINE 529 MG/ML IV SOLN COMPARISON:  10/01/2013 MR. FINDINGS: MRI HEAD FINDINGS Besides the below described questionable punctate infarct of the left optic nerve, no infarct is  noted. Remote infarct left lenticular nucleus/ caudate. Mild to moderate chronic small vessel disease changes. No intracranial hemorrhage. No intracranial mass or abnormal enhancement. Global atrophy without hydrocephalus. Small mega cisterna magna incidentally noted. Major intracranial vascular structures are patent. MRI ORBITS FINDINGS On diffusion sequence, question of punctate infarct involving the left optic nerve. This may be related to artifact rather than infarct. Otherwise, symmetric normal appearance of the optic nerves without abnormal enhancement or signal intensity. No orbital mass noted. Globes appear intact. Minimal mucosal thickening ethmoid sinus air cells. Symmetric normal appearance of the cavernous sinus. IMPRESSION: MRI ORBITS On diffusion sequence, question of punctate infarct involving the left optic nerve. This may be related to artifact rather than infarct. Otherwise, symmetric normal appearance of the optic nerves. MRI HEAD Remote infarct left lenticular nucleus/ caudate. Mild to moderate chronic small vessel disease changes. Global atrophy. Electronically Signed   By: Genia Del M.D.   On: 08/06/2015 20:31   I have personally reviewed and evaluated these images and lab results as part of my medical decision-making.   EKG Interpretation None      MDM  Patient was seen and evaluated in stable condition. Benign neurologic examination. MRI orbits and MRI brain obtained. They showed a possible small infarct of the left optic nerve and a remote infarct of the caudate. These results were discussed on the phone with Dr. Manuella Ghazi the ophthalmologist who is happy with these results. He said in light of these results we did not need to send off the blood work she had requested. He said he was more worried that the patient had another process going on that would cause him to continue to lose vision in site. I discussed these results as well with Dr. Nicole Kindred from neurology. He said at this  time it does not appear the patient is having an acute stroke and he said that the patient could be discharged safely to follow up outpatient with his primary care physician. This was discussed at length with the patient and his wife at bedside. I didn't check the patient to begin taking aspirin and to make sure he caught his primary care doctor to be seen outpatient as soon as possible. They expressed understanding and agreement with plan of care. Patient was discharged home in stable condition with strict return precautions. Final diagnoses:  Vision loss    1. Vision change    Harvel Quale, MD 08/07/15 [id number]

## 2015-08-06 NOTE — ED Notes (Signed)
The pt has had  Vision loss in his lt eye since this past Wednesday.  No pain.  He saw his regular doctor in [place] today and was then sent to an eye doctor who sent him here for  A mri   He has a letter with him describing what the eye doctor saw  He is being sent for a lesion that was seen around the lt optic nerve.  Pt alert no other problems

## 2015-08-06 NOTE — Discharge Instructions (Signed)
You were seen and evaluated today for your vision changes. Your MRI shows that you possibly had strokes in the past but no new stroke. I talked to the neurologist on-call and he said that there was nothing acute to be done at this time and that he did not need to stay in the hospital. I also talked to Dr. Brigitte Pulse the ophthalmologist who sent to to the emergency department. Please follow-up outpatient with your primary care doctor as soon as possible and also with ophthalmology. If you have no reason to not to take aspirin please begin taking aspirin daily.  Stroke Prevention Some medical conditions and behaviors are associated with an increased chance of having a stroke. You may prevent a stroke by making healthy choices and managing medical conditions. HOW CAN I REDUCE MY RISK OF HAVING A STROKE?   Stay physically active. Get at least 30 minutes of activity on most or all days.  Do not smoke. It may also be helpful to avoid exposure to secondhand smoke.  Limit alcohol use. Moderate alcohol use is considered to be:  No more than 2 drinks per day for men.  No more than 1 drink per day for nonpregnant women.  Eat healthy foods. This involves:  Eating 5 or more servings of fruits and vegetables a day.  Making dietary changes that address high blood pressure (hypertension), high cholesterol, diabetes, or obesity.  Manage your cholesterol levels.  Making food choices that are high in fiber and low in saturated fat, trans fat, and cholesterol may control cholesterol levels.  Take any prescribed medicines to control cholesterol as directed by your health care provider.  Manage your diabetes.  Controlling your carbohydrate and sugar intake is recommended to manage diabetes.  Take any prescribed medicines to control diabetes as directed by your health care provider.  Control your hypertension.  Making food choices that are low in salt (sodium), saturated fat, trans fat, and cholesterol is  recommended to manage hypertension.  Ask your health care provider if you need treatment to lower your blood pressure. Take any prescribed medicines to control hypertension as directed by your health care provider.  If you are 4-36 years of age, have your blood pressure checked every 3-5 years. If you are 64 years of age or older, have your blood pressure checked every year.  Maintain a healthy weight.  Reducing calorie intake and making food choices that are low in sodium, saturated fat, trans fat, and cholesterol are recommended to manage weight.  Stop drug abuse.  Avoid taking birth control pills.  Talk to your health care provider about the risks of taking birth control pills if you are over 66 years old, smoke, get migraines, or have ever had a blood clot.  Get evaluated for sleep disorders (sleep apnea).  Talk to your health care provider about getting a sleep evaluation if you snore a lot or have excessive sleepiness.  Take medicines only as directed by your health care provider.  For some people, aspirin or blood thinners (anticoagulants) are helpful in reducing the risk of forming abnormal blood clots that can lead to stroke. If you have the irregular heart rhythm of atrial fibrillation, you should be on a blood thinner unless there is a good reason you cannot take them.  Understand all your medicine instructions.  Make sure that other conditions (such as anemia or atherosclerosis) are addressed. SEEK IMMEDIATE MEDICAL CARE IF:   You have sudden weakness or numbness of the face, arm, or  leg, especially on one side of the body.  Your face or eyelid droops to one side.  You have sudden confusion.  You have trouble speaking (aphasia) or understanding.  You have sudden trouble seeing in one or both eyes.  You have sudden trouble walking.  You have dizziness.  You have a loss of balance or coordination.  You have a sudden, severe headache with no known cause.  You  have new chest pain or an irregular heartbeat. Any of these symptoms may represent a serious problem that is an emergency. Do not wait to see if the symptoms will go away. Get medical help at once. Call your local emergency services (911 in [place] Do not drive yourself to the hospital.   This information is not intended to replace advice given to you by your health care provider. Make sure you discuss any questions you have with your health care provider.   Document Released: 04/06/2004 Document Revised: 03/20/2014 Document Reviewed: 08/30/2012 Elsevier Interactive Patient Education [employer].

## 2015-08-11 ENCOUNTER — Ambulatory Visit (INDEPENDENT_AMBULATORY_CARE_PROVIDER_SITE_OTHER): Payer: BLUE CROSS/BLUE SHIELD | Admitting: Family Medicine

## 2015-08-11 ENCOUNTER — Encounter: Payer: Self-pay | Admitting: Family Medicine

## 2015-08-11 VITALS — BP 112/74 | Ht 68.5 in | Wt 199.2 lb

## 2015-08-11 DIAGNOSIS — R0600 Dyspnea, unspecified: Secondary | ICD-10-CM

## 2015-08-11 DIAGNOSIS — I10 Essential (primary) hypertension: Secondary | ICD-10-CM

## 2015-08-11 DIAGNOSIS — E785 Hyperlipidemia, unspecified: Secondary | ICD-10-CM | POA: Diagnosis not present

## 2015-08-11 DIAGNOSIS — I6349 Cerebral infarction due to embolism of other cerebral artery: Secondary | ICD-10-CM

## 2015-08-11 DIAGNOSIS — Z79899 Other long term (current) drug therapy: Secondary | ICD-10-CM

## 2015-08-11 MED ORDER — MOMETASONE FUROATE 0.1 % EX CREA: TOPICAL_CREAM | CUTANEOUS | Status: DC

## 2015-08-11 NOTE — Progress Notes (Signed)
   Subjective:    Patient ID: Anthony Little, male    DOB: 1950-10-18, 65 y.o.   MRN: [id number]  HPI Patient is here today for a ED follow up visit. Patient was seen at [facility] ED on 08/06/15 for vision changes.  Patient states that on Saturday he had an episode where he completely "zoned out". Paramedics was called and patient was treated and stable.   MRI as well as evaluation of the lab work from the ER as well as evaluation of previous MRI MRA was reviewed in detail. Reviewed with the patient and his wife. Long discussion held regarding the possibility of strokes as well as the previous stroke as well as the optic nerves artery occlusion. This patient does need to stay on aspirin He has not smoked in a proximally 6 months In the past his cholesterol has been good with HDL in the 85 range LDL 106 He does have history of blood pressure issues but takes his medicine. Review of Systems Denies any unilateral numbness weakness ataxia dizziness confusion    Objective:   Physical Exam  Neck no masses lungs are clear no crackles heart is regular pulse normal abdomen is soft extremities no edema skin warm dry  Neurologic gross normal left visual field mild deficit    Assessment & Plan:  Follow through with eye specialist I recommend daytime driving only until cleared by the eye specialist Warned patient regarding visual field loss and how this can affect driving in the importance of scanning with both eyes on a regular basis Importance of taking 81 mg aspirin Lab work to look at cholesterol Patient will need MRA of the head as well as neck because of the stroke. Will need referral to neurology as well Patient had coronary artery disease seen on CAT scan in patient also had dyspnea on exertion EKG looks good but referral to cardiology recommended Patient also with a pulmonary nodule which were do a follow-up CAT scan later this year Patient with a history of a small 2 mm  aneurysm on previous MRA back in 2015 we will relook at this on his follow-up MRA. Very complex patient 30 minutes spent with patient  Finally a cream was prescribed for a chronic dermatitis at his right ankle if this doesn't clear up he will let us

## 2015-08-12 ENCOUNTER — Encounter: Payer: Self-pay | Admitting: Family Medicine

## 2015-08-12 LAB — FUNGUS CULTURE, BLOOD

## 2015-08-12 LAB — SPECIMEN STATUS REPORT

## 2015-08-12 NOTE — Addendum Note (Signed)
Addended by: Launa Grill on: 08/12/2015 09:39 AM   Modules accepted: Orders

## 2015-08-12 NOTE — Progress Notes (Signed)
MRA for neck and brain scheduled for 08/24/15 for 12:00pm . This is the next available that Anthony Little has, Anthony Little is booked until this date as well. Would you like to keep this appointment date?

## 2015-08-13 ENCOUNTER — Telehealth: Payer: Self-pay | Admitting: Family Medicine

## 2015-08-13 NOTE — Telephone Encounter (Signed)
FYI: I sent message back with CC'd chart not sure if you received it but MRA neck and brain for patient is scheduled for 08/24/15 @12 :00 pm at [facility]. No earlier dates [facility] or [facility]. Patient aware of appointment.

## 2015-08-13 NOTE — Telephone Encounter (Signed)
6/13 ok for situation

## 2015-08-13 NOTE — Telephone Encounter (Signed)
ok 

## 2015-08-14 LAB — LIPID PANEL
CHOL/HDL RATIO: 2.9 ratio (ref 0.0–5.0)
Cholesterol, Total: 182 mg/dL (ref 100–199)
HDL: 62 mg/dL (ref >39)
LDL Calculated: 106 mg/dL — ABNORMAL HIGH (ref 0–99)
TRIGLYCERIDES: 72 mg/dL (ref 0–149)
VLDL CHOLESTEROL CAL: 14 mg/dL (ref 5–40)

## 2015-08-14 LAB — TSH: TSH: 1.09 u[IU]/mL (ref 0.450–4.500)

## 2015-08-14 LAB — BASIC METABOLIC PANEL
BUN / CREAT RATIO: 22 (ref 10–24)
BUN: 41 mg/dL — ABNORMAL HIGH (ref 8–27)
CO2: 22 mmol/L (ref 18–29)
Calcium: 9.1 mg/dL (ref 8.6–10.2)
Chloride: 94 mmol/L — ABNORMAL LOW (ref 96–106)
Creatinine, Ser: 1.9 mg/dL — ABNORMAL HIGH (ref 0.76–1.27)
GFR, EST AFRICAN AMERICAN: 42 mL/min/{1.73_m2} — AB (ref >59)
GFR, EST NON AFRICAN AMERICAN: 36 mL/min/{1.73_m2} — AB (ref >59)
Glucose: 98 mg/dL (ref 65–99)
Potassium: 5.1 mmol/L (ref 3.5–5.2)
Sodium: 132 mmol/L — ABNORMAL LOW (ref 134–144)

## 2015-08-14 LAB — HEPATIC FUNCTION PANEL
ALK PHOS: 57 IU/L (ref 39–117)
ALT: 12 IU/L (ref 0–44)
AST: 16 IU/L (ref 0–40)
Albumin: 4.1 g/dL (ref 3.6–4.8)
Bilirubin Total: 0.4 mg/dL (ref 0.0–1.2)
Bilirubin, Direct: 0.14 mg/dL (ref 0.00–0.40)
TOTAL PROTEIN: 6.4 g/dL (ref 6.0–8.5)

## 2015-08-14 LAB — SEDIMENTATION RATE: Sed Rate: 6 mm/hr (ref 0–30)

## 2015-08-14 LAB — C-REACTIVE PROTEIN: CRP: 0.6 mg/L (ref 0.0–4.9)

## 2015-08-16 ENCOUNTER — Encounter: Payer: Self-pay | Admitting: Family Medicine

## 2015-08-17 ENCOUNTER — Other Ambulatory Visit: Payer: Self-pay | Admitting: Family Medicine

## 2015-08-17 NOTE — Addendum Note (Signed)
Addended by: Dairl Ponder on: 08/17/2015 10:48 AM   Modules accepted: Orders

## 2015-08-18 ENCOUNTER — Telehealth: Payer: Self-pay | Admitting: Family Medicine

## 2015-08-18 ENCOUNTER — Other Ambulatory Visit: Payer: Self-pay | Admitting: Family Medicine

## 2015-08-18 MED ORDER — DOXYCYCLINE HYCLATE 100 MG PO TABS: 100.0000 mg | ORAL_TABLET | Freq: Two times a day (BID) | ORAL | Status: DC

## 2015-08-18 NOTE — Telephone Encounter (Signed)
Received and RX request from patient's pharmacy for Doxycycline 100 MG. Take 1 tablet twice a day two times a day. May we refill?

## 2015-08-18 NOTE — Telephone Encounter (Signed)
Received fax from patient's pharmacy for Doxycycline 100 MG tab, Take 1 tablet by mouth two times a daily. May we refill?

## 2015-08-18 NOTE — Telephone Encounter (Signed)
I spoke with Anthony Little by phone, I expressed concern that he is still having significant medical health issues. This patient will need thorough workup by cardiology as well as workup by neurology because of strokes. He has a MRA pending and visits with [place] specialist. The patient reports unsteadiness on his feet. He also has documented peripheral vision loss on the left visual field. All of this together makes it difficult for him to do his job safely which requires extensive walking going up and down steps and significant interactions throughout the day. This patient has been advised to take a short-term leave. He will see cardiology and neurology. He will have his MRA. He will do a follow-up office visit with [place] in early July then we will determine when he is able to return to work. In addition to this we will be happy to provide information to [name] his insurance company upon their request. I have received a form from the patient granting release of medical information. This will be forwarded to her medical records specialist.

## 2015-08-18 NOTE — Addendum Note (Signed)
Addended by: Dairl Ponder on: 08/18/2015 11:08 AM   Modules accepted: Orders, Medications

## 2015-08-18 NOTE — Telephone Encounter (Signed)
Prescription sent electronically to pharmacy. 

## 2015-08-18 NOTE — Telephone Encounter (Signed)
Doxycycline 100 mg twice a day #64 refills, please make sure this prescription is in Epic

## 2015-08-19 LAB — BASIC METABOLIC PANEL
BUN / CREAT RATIO: 22 (ref 10–24)
BUN: 40 mg/dL — AB (ref 8–27)
CALCIUM: 9.4 mg/dL (ref 8.6–10.2)
CO2: 21 mmol/L (ref 18–29)
CREATININE: 1.8 mg/dL — AB (ref 0.76–1.27)
Chloride: 96 mmol/L (ref 96–106)
GFR calc Af Amer: 45 mL/min/{1.73_m2} — ABNORMAL LOW (ref >59)
GFR calc non Af Amer: 39 mL/min/{1.73_m2} — ABNORMAL LOW (ref >59)
GLUCOSE: 93 mg/dL (ref 65–99)
Potassium: 4.8 mmol/L (ref 3.5–5.2)
Sodium: 133 mmol/L — ABNORMAL LOW (ref 134–144)

## 2015-08-19 MED ORDER — ATORVASTATIN CALCIUM 10 MG PO TABS: 10.0000 mg | ORAL_TABLET | Freq: Every day | ORAL | Status: DC

## 2015-08-19 NOTE — Addendum Note (Signed)
Addended by: Dairl Ponder on: 08/19/2015 11:34 AM   Modules accepted: Orders

## 2015-08-24 ENCOUNTER — Ambulatory Visit (HOSPITAL_COMMUNITY)
Admission: RE | Admit: 2015-08-24 | Discharge: 2015-08-24 | Disposition: A | Discharge status: Home / Self Care | Payer: BLUE CROSS/BLUE SHIELD | Source: Ambulatory Visit | Attending: Family Medicine | Admitting: Family Medicine

## 2015-08-24 DIAGNOSIS — I6349 Cerebral infarction due to embolism of other cerebral artery: Secondary | ICD-10-CM | POA: Diagnosis not present

## 2015-08-24 DIAGNOSIS — R0600 Dyspnea, unspecified: Secondary | ICD-10-CM | POA: Insufficient documentation

## 2015-08-24 DIAGNOSIS — I6523 Occlusion and stenosis of bilateral carotid arteries: Secondary | ICD-10-CM | POA: Diagnosis not present

## 2015-08-24 MED ORDER — GADOBENATE DIMEGLUMINE 529 MG/ML IV SOLN
10.0000 mL | Freq: Once | INTRAVENOUS | Status: AC | PRN
  Administered 2015-08-24: 9 mL via INTRAVENOUS

## 2015-08-26 ENCOUNTER — Ambulatory Visit (INDEPENDENT_AMBULATORY_CARE_PROVIDER_SITE_OTHER): Payer: BLUE CROSS/BLUE SHIELD | Admitting: Cardiology

## 2015-08-26 ENCOUNTER — Encounter: Payer: Self-pay | Admitting: Cardiology

## 2015-08-26 ENCOUNTER — Telehealth: Payer: Self-pay | Admitting: Cardiology

## 2015-08-26 VITALS — BP 166/80 | HR 70 | Ht 70.0 in | Wt 202.0 lb

## 2015-08-26 DIAGNOSIS — R0609 Other forms of dyspnea: Secondary | ICD-10-CM | POA: Diagnosis not present

## 2015-08-26 DIAGNOSIS — E785 Hyperlipidemia, unspecified: Secondary | ICD-10-CM

## 2015-08-26 DIAGNOSIS — Z8673 Personal history of transient ischemic attack (TIA), and cerebral infarction without residual deficits: Secondary | ICD-10-CM | POA: Diagnosis not present

## 2015-08-26 DIAGNOSIS — I10 Essential (primary) hypertension: Secondary | ICD-10-CM

## 2015-08-26 DIAGNOSIS — I25119 Atherosclerotic heart disease of native coronary artery with unspecified angina pectoris: Secondary | ICD-10-CM

## 2015-08-26 NOTE — Progress Notes (Signed)
Cardiology Office Note  Date: 08/26/2015   ID: Anthony Little, DOB [birth year], MRN [id number]  PCP: Anthony Lange, MD  Consulting Cardiologist: Anthony Lesches, MD   Chief Complaint  Patient presents with  . Coronary artery calcifications by CT  . History of stroke    History of Present Illness: Anthony Little is a 65 y.o. male referred for cardiology consultation by Anthony Little. He is here today with his wife. I reviewed the recent records. He reports approximately two-month history of exertional fatigue and lack of stamina, dyspnea on exertion as well. No definite exertional chest pain or palpitations. This is with typical activities that were not causing him a problem previously. He has been incidentally found to have coronary artery calcifications by abdominal CT imaging, has no prior diagnosis of obstructive CAD or myocardial infarction. Separate from these symptoms, he also experienced left visual changes and was found to have evidence of cerebrovascular disease based on recent neuroimaging. He has not seen a neurologist as yet. MRI studies are outlined below.  He does not report any known history of cardiac arrhythmias, no atrial fibrillation. I reviewed his recent ECG which showed sinus rhythm. He did not have any significant carotid stenoses by MRA with mild atherosclerosis at the carotid bifurcations.  He has a history of hypertension and hyperlipidemia as outlined, recently placed on Lipitor with LDL 106. States his blood pressure has been up and down recently. He reports compliance with Norvasc.  He has not undergone any prior ischemic or cardiac structural testing.  Past Medical History  Diagnosis Date  . Essential hypertension   . Hyperlipidemia     Past Surgical History  Procedure Laterality Date  . Colonoscopy  12/2009    Diverticulosis, mild internal hemorrhoids, multiple colon polyps removed (adenomatous). One irregular shaped polyp was tattooed. Next  colonoscopy due October 2016.  . Cardiovascular stress test  2002    negative  . Esophagogastroduodenoscopy N/A 10/08/2012    Procedure: ESOPHAGOGASTRODUODENOSCOPY (EGD);  Surgeon: Anthony Binder, MD;  Location: [facility];  Service: Endoscopy;  Laterality: N/A;  12:45    Current Outpatient Prescriptions  Medication Sig Dispense Refill  . amLODipine (NORVASC) 5 MG tablet TAKE 1 TABLET (5 MG TOTAL) BY MOUTH DAILY. 30 tablet 5  . aspirin 81 MG tablet Take 81 mg by mouth daily.    Marland Kitchen atorvastatin (LIPITOR) 10 MG tablet Take 1 tablet (10 mg total) by mouth daily. 30 tablet 3  . Cyanocobalamin (VITAMIN B-12 CR PO) Take by mouth.    . doxycycline (VIBRA-TABS) 100 MG tablet Take 1 tablet (100 mg total) by mouth 2 (two) times daily. 60 tablet 2  . mometasone (ELOCON) 0.1 % cream Apply to affected area bid prn 45 g 1   No current facility-administered medications for this visit.   Allergies:  Review of patient's allergies indicates no known allergies.   Social History: The patient  reports that he quit smoking about 7 months ago. He quit smokeless tobacco use about 7 months ago. He reports that he drinks alcohol. He reports that he does not use illicit drugs.   Family History: The patient's family history includes Diabetes in his maternal grandfather and mother; Parkinson's disease in his father. There is no history of Ulcers or Colon cancer.   ROS:  Please see the history of present illness. Otherwise, complete review of systems is positive for left-sided decreased visual acuity, no focal motor weakness.  All other systems are reviewed and negative.  Physical Exam: VS:  BP 166/80 mmHg  Pulse 70  Ht 5\' 10"  (1.778 m)  Wt 202 lb (91.627 kg)  BMI 28.98 kg/m2  SpO2 97%, BMI Body mass index is 28.98 kg/(m^2).  Wt Readings from Last 3 Encounters:  08/26/15 202 lb (91.627 kg)  08/11/15 199 lb 4 oz (90.379 kg)  07/08/15 214 lb (97.07 kg)    General: Patient appears comfortable at  rest. HEENT: Conjunctiva and lids normal, oropharynx clear. Neck: Supple, no elevated JVP or carotid bruits, no thyromegaly. Lungs: Clear to auscultation, nonlabored breathing at rest. Cardiac: Regular rate and rhythm, no S3, soft systolic murmur, no pericardial rub. Abdomen: Soft, nontender, bowel sounds present, no guarding or rebound. Extremities: No pitting edema, distal pulses 2+. Skin: Warm and dry. Musculoskeletal: No kyphosis. Neuropsychiatric: Alert and oriented x3, affect grossly appropriate.  ECG: I personally reviewed the tracing from 08/11/2015 which showed normal sinus rhythm with possible left atrial enlargement.  Recent Labwork: 08/06/2015: Hemoglobin 13.6; Platelets 225 08/13/2015: ALT 12; AST 16; TSH 1.090 08/18/2015: BUN 40*; Creatinine, Ser 1.80*; Potassium 4.8; Sodium 133*     Component Value Date/Time   CHOL 182 08/13/2015 1320   CHOL 202* 07/30/2012 1215   TRIG 72 08/13/2015 1320   HDL 62 08/13/2015 1320   HDL 85 07/30/2012 1215   CHOLHDL 2.9 08/13/2015 1320   CHOLHDL 2.4 07/30/2012 1215   VLDL 11 07/30/2012 1215   LDLCALC 106* 08/13/2015 1320   LDLCALC 106* 07/30/2012 1215    Other Studies Reviewed Today:  Neck MRA 08/24/2015: IMPRESSION: 1. Mild atherosclerotic change at the carotid bifurcations bilaterally without significant stenosis. 2. Otherwise negative MRA of the neck.  Head MRA 08/24/2015: IMPRESSION: 1. Mild narrowing of the mid left A1 segment. 2. No significant internal carotid artery disease to suggest ophthalmic artery disease. 3. The posterior circulation is intact.  Head and orbit MRI 08/06/2015: IMPRESSION: MRI ORBITS  On diffusion sequence, question of punctate infarct involving the left optic nerve. This may be related to artifact rather than infarct. Otherwise, symmetric normal appearance of the optic nerves.  MRI HEAD  Remote infarct left lenticular nucleus/ caudate. Mild to moderate chronic small vessel disease  changes.  Global atrophy.  CT abdomen 07/22/2015: IMPRESSION: 1. Normal pancreas. No CT evidence of pancreatitis, pancreatic mass or pancreatic or biliary ductal dilatation. 2. Left lower lobe 6 mm solid pulmonary nodule, mildly increased in size and density since 2014. Non-contrast chest CT at 6-12 months is recommended. If the nodule is stable at time of repeat CT, then future CT at 18-24 months (from today's scan) is considered optional for low-risk patients, but is recommended for high-risk patients. This recommendation follows the consensus statement: Guidelines for Management of Incidental Pulmonary Nodules Detected on CT Images:From the Fleischner Society 2017; published online before print (10.1148/radiol.IJ:2314499). 3. Additional findings include mild centrilobular emphysema and coronary atherosclerosis.  Assessment and Plan:  1. Coronary artery calcifications noted by recent CT imaging. Patient reports two-month history of exertional fatigue and decreased stamina, dyspnea on exertion as well. Baseline history includes hypertension and hyperlipidemia, also recent findings of evidence of cerebrovascular disease. He has not undergone prior cardiac structural or ischemic testing. Will arrange an echocardiogram as well as an exercise Cardiolite. If he is not able to achieve adequate heart rate on the treadmill, would switch to a Lexiscan study. For now continue aspirin, Norvasc and statin therapy.  2. Recent MRI studies indicating remote left lenticular nucleus/caudate infarct with small vessel disease changes. Also question of  punctate infarct in the left optic nerve versus artifact. With visual changes, presume that this was not artifact. I would agree that Neurology consultation is needed. He is on aspirin and statin for now. He does not have any history of cardiac arrhythmia. Further cardiac monitoring, at least with a 30 day event recorder might be considered to exclude  asymptomatic PAF which necessitate a change to anticoagulation from antiplatelet regimen. We can arrange this, particularly if Neurology feels would be useful.  3. Essential hypertension, continue Norvasc.  4. Hyperlipidemia, now on Lipitor with recent LDL 106.  5. Tobacco abuse in remission.  Current medicines were reviewed with the patient today.   Orders Placed This Encounter  Procedures  . NM Myocar Multi W/Spect W/Wall Motion / EF  . ECHO COMPLETE    Disposition: Call with results and determine follow-up.  Signed, Satira Sark, MD, [facility] 08/26/2015 11:13 AM    [facility] at [facility] [address], [place], [place] [address] Phone: [phone]; Fax: [phone]

## 2015-08-26 NOTE — Patient Instructions (Signed)
Your physician recommends that you schedule a follow-up appointment in:  To be determined after tests   Your physician recommends that you continue on your current medications as directed. Please refer to the Current Medication list given to you today.    Your physician has requested that you have an echocardiogram. Echocardiography is a painless test that uses sound waves to create images of your heart. It provides your doctor with information about the size and shape of your heart and how well your heart's chambers and valves are working. This procedure takes approximately one hour. There are no restrictions for this procedure.    Your physician has requested that you have en exercise stress myoview. For further information please visit [email or web address]. Please follow instruction sheet, as given.       Thank you for choosing [facility] !

## 2015-08-26 NOTE — Telephone Encounter (Signed)
Please schedule test same day and ASAP for patient  They are willing to got [facility] to get done. Echo and exercise cardiolite

## 2015-08-27 ENCOUNTER — Other Ambulatory Visit: Payer: Self-pay | Admitting: *Deleted

## 2015-08-27 ENCOUNTER — Telehealth: Payer: Self-pay | Admitting: Family Medicine

## 2015-08-27 DIAGNOSIS — I679 Cerebrovascular disease, unspecified: Secondary | ICD-10-CM

## 2015-08-27 DIAGNOSIS — I6389 Other cerebral infarction: Secondary | ICD-10-CM

## 2015-08-27 NOTE — Telephone Encounter (Signed)
Please put in for urgent referral to neurology ( I thought this was previously done but I dont see where it was) pt saw neurology in 2015,Dr.Jaffe. please call neurology to set up this pt at next available appt.( if they are unable to give appt with the phone call please follow up on this and let me know when his appt is) Reason stroke,cerebral vascular disease.

## 2015-08-27 NOTE — Telephone Encounter (Signed)
Order for urgent referral put in. Discussed with Estill Bamberg since Izora Ribas is not in office today to do referral. Estill Bamberg is working on referral.

## 2015-09-07 ENCOUNTER — Telehealth: Payer: Self-pay | Admitting: Family Medicine

## 2015-09-07 ENCOUNTER — Ambulatory Visit (INDEPENDENT_AMBULATORY_CARE_PROVIDER_SITE_OTHER): Payer: BLUE CROSS/BLUE SHIELD | Admitting: Neurology

## 2015-09-07 ENCOUNTER — Encounter: Payer: Self-pay | Admitting: Neurology

## 2015-09-07 VITALS — BP 146/88 | HR 68 | Ht 70.0 in | Wt 202.0 lb

## 2015-09-07 DIAGNOSIS — I679 Cerebrovascular disease, unspecified: Secondary | ICD-10-CM

## 2015-09-07 DIAGNOSIS — H8110 Benign paroxysmal vertigo, unspecified ear: Secondary | ICD-10-CM

## 2015-09-07 DIAGNOSIS — I10 Essential (primary) hypertension: Secondary | ICD-10-CM

## 2015-09-07 DIAGNOSIS — H47012 Ischemic optic neuropathy, left eye: Secondary | ICD-10-CM

## 2015-09-07 DIAGNOSIS — R55 Syncope and collapse: Secondary | ICD-10-CM

## 2015-09-07 DIAGNOSIS — E785 Hyperlipidemia, unspecified: Secondary | ICD-10-CM

## 2015-09-07 NOTE — Patient Instructions (Signed)
1.  We will continue aspirin 81mg  daily 2.  Continue the Lipitor to get the cholesterol lower (the LDL) 3.  Will refer you for vestibular rehabilitation 4.  Follow up in 3 months.

## 2015-09-07 NOTE — Progress Notes (Signed)
NEUROLOGY FOLLOW UP OFFICE NOTE  KUNTA NEPHEW [id number]  HISTORY OF PRESENT ILLNESS: Anthony Little is a 65 year old left-handed man with hypertension and hyperlipidemia and former smoker whom I previously saw for subdural hematoma presents today for stroke.  History obtained by patient, his wife and PCP note.  In May, he began experiencing peripheral vision loss in the left eye, that had progressively gotten worse over a 2 week period.  He was seen by ophthalmology, Dr. Manuella Ghazi, on 08/06/15, who noted left disc edema and sent him over to the ED for emergent MRI of the brain.  MRI of the brain and orbits with and without contrast was personally reviewed and showed chronic small vessel disease with remote infarct of the left caudate.  On diffusion sequence, there was question of punctate infarct involving the left optic nerve.  He later followed up with his PCP.  Sed Rate was 6, CRP 0.6.  Lipid panel showed total cholesterol of 182, HDL 62, LDL 106.  He later had an MRA of the head which was personally reviewed and showed maybe mild focal narrowing of the mid left A1 segment but no significant intracranial stenosis.  He was started on Lipitor 10mg  daily.  He still notes that it looks like he is "looking through gauze" from his left eye.  It has not improved but not gotten worse.  He also has episodic positional dizziness, described as spinning sensation, lasting a few seconds.  Meclizine was ineffective.  He was given home exercises to perform, which were ineffective.  About 3 weeks ago, he passed out.  He felt dizzy before he lost consciousness.  His blood pressure was around 80s/60s. He did not drink much that day.  PAST MEDICAL HISTORY: Past Medical History  Diagnosis Date  . Essential hypertension   . Hyperlipidemia     MEDICATIONS: Current Outpatient Prescriptions on File Prior to Visit  Medication Sig Dispense Refill  . amLODipine (NORVASC) 5 MG tablet TAKE 1 TABLET (5 MG TOTAL)  BY MOUTH DAILY. 30 tablet 5  . aspirin 81 MG tablet Take 81 mg by mouth daily.    Marland Kitchen atorvastatin (LIPITOR) 10 MG tablet Take 1 tablet (10 mg total) by mouth daily. 30 tablet 3  . Cyanocobalamin (VITAMIN B-12 CR PO) Take by mouth.    . doxycycline (VIBRA-TABS) 100 MG tablet Take 1 tablet (100 mg total) by mouth 2 (two) times daily. 60 tablet 2  . mometasone (ELOCON) 0.1 % cream Apply to affected area bid prn 45 g 1   No current facility-administered medications on file prior to visit.    ALLERGIES: No Known Allergies  FAMILY HISTORY: Family History  Problem Relation Age of Onset  . Diabetes Mother   . Diabetes Maternal Grandfather   . Ulcers Neg Hx   . Colon cancer Neg Hx   . Parkinson's disease Father    SOCIAL HISTORY: Social History   Social History  . Marital Status: Married    Spouse Name: N/A  . Number of Children: N/A  . Years of Education: N/A   Occupational History  . [employer]    Social History Main Topics  . Smoking status: Former Smoker -- 1.00 packs/day    Quit date: 12/26/2014  . Smokeless tobacco: Former Systems developer    Quit date: 01/07/2015     Comment: he would like to but he knows it is hard   . Alcohol Use: 0.0 oz/week    0 Standard drinks or equivalent  per week     Comment: liquor 5 ounces per night, long-time  . Drug Use: No  . Sexual Activity: No   Other Topics Concern  . Not on file   Social History Narrative    REVIEW OF SYSTEMS: Constitutional: No fevers, chills, or sweats, no generalized fatigue, change in appetite Eyes: No visual changes, double vision, eye pain Ear, nose and throat: No hearing loss, ear pain, nasal congestion, sore throat Cardiovascular: No chest pain, palpitations Respiratory:  No shortness of breath at rest or with exertion, wheezes GastrointestinaI: No nausea, vomiting, diarrhea, abdominal pain, fecal incontinence Genitourinary:  No dysuria, urinary retention or frequency Musculoskeletal:  No neck pain, back  pain Integumentary: No rash, pruritus, skin lesions Neurological: as above Psychiatric: No depression, insomnia, anxiety Endocrine: No palpitations, fatigue, diaphoresis, mood swings, change in appetite, change in weight, increased thirst Hematologic/Lymphatic:  No purpura, petechiae. Allergic/Immunologic: no itchy/runny eyes, nasal congestion, recent allergic reactions, rashes  PHYSICAL EXAM: Filed Vitals:   09/07/15 1320  BP: 146/88  Pulse: 68   General: No acute distress.  Patient appears well-groomed.  normal body habitus. Head:  Normocephalic/atraumatic Eyes:  Fundi examined but not visualized Neck: supple, no paraspinal tenderness, full range of motion Heart:  Regular rate and rhythm Lungs:  Clear to auscultation bilaterally Back: No paraspinal tenderness Neurological Exam: alert and oriented to person, place, and time. Attention span and concentration intact, recent and remote memory intact, fund of knowledge intact.  Speech fluent and not dysarthric, language intact.  Fuzzy look in left eye.  Otherwise, CN II-XII intact. Bulk and tone normal, muscle strength 5/5 throughout.  Sensation to light touch, temperature and vibration intact.  Deep tendon reflexes 2+ throughout, toes downgoing.  Finger to nose and heel to shin testing intact.  Gait normal, Romberg negative.  IMPRESSION: Ischemic optic neuropathy of left eye Cerebrovascular disease BPPV HTN Hyperlipidemia Syncope, likely related to dehydration.  PLAN: 1.  We discussed remaining on ASA 81mg  daily versus switching to Plavix.  One has not been shown to be of greater benefit in secondary stroke prevention over the other, except Plavix increases risk for bleeding.  He would like to remain on ASA 81mg  daily. 2.  Continue Lipitor 10mg  daily as per PCP management.  LDL goal should be less than 70 3.  Blood pressure control 4.  Refer for vestibular rehab 5.  Follow up in 3 months.  Metta Clines, DO  CC:  Sallee Lange,  MD

## 2015-09-07 NOTE — Telephone Encounter (Signed)
Thank you :)

## 2015-09-07 NOTE — Telephone Encounter (Signed)
Pt called to follow up on medical records release to [facility] for disability claim. I have printed and faxed records and form to [facility] [phone]. Pt was notified.

## 2015-09-10 ENCOUNTER — Encounter: Payer: Self-pay | Admitting: Family Medicine

## 2015-09-10 ENCOUNTER — Encounter (HOSPITAL_COMMUNITY)
Admission: RE | Admit: 2015-09-10 | Discharge: 2015-09-10 | Disposition: A | Discharge status: Home / Self Care | Payer: BLUE CROSS/BLUE SHIELD | Source: Ambulatory Visit | Attending: Cardiology | Admitting: Cardiology

## 2015-09-10 ENCOUNTER — Ambulatory Visit (HOSPITAL_COMMUNITY)
Admission: RE | Admit: 2015-09-10 | Discharge: 2015-09-10 | Disposition: A | Discharge status: Home / Self Care | Payer: BLUE CROSS/BLUE SHIELD | Source: Ambulatory Visit | Attending: Cardiology | Admitting: Cardiology

## 2015-09-10 ENCOUNTER — Encounter (HOSPITAL_COMMUNITY): Payer: Self-pay

## 2015-09-10 ENCOUNTER — Ambulatory Visit (INDEPENDENT_AMBULATORY_CARE_PROVIDER_SITE_OTHER): Payer: BLUE CROSS/BLUE SHIELD | Admitting: Family Medicine

## 2015-09-10 ENCOUNTER — Inpatient Hospital Stay (HOSPITAL_COMMUNITY): Admission: RE | Admit: 2015-09-10 | Payer: BLUE CROSS/BLUE SHIELD | Source: Ambulatory Visit

## 2015-09-10 VITALS — BP 132/86 | Ht 68.5 in | Wt 202.6 lb

## 2015-09-10 DIAGNOSIS — I510 Cardiac septal defect, acquired: Secondary | ICD-10-CM | POA: Insufficient documentation

## 2015-09-10 DIAGNOSIS — I119 Hypertensive heart disease without heart failure: Secondary | ICD-10-CM | POA: Insufficient documentation

## 2015-09-10 DIAGNOSIS — N289 Disorder of kidney and ureter, unspecified: Secondary | ICD-10-CM | POA: Diagnosis not present

## 2015-09-10 DIAGNOSIS — R0609 Other forms of dyspnea: Secondary | ICD-10-CM

## 2015-09-10 DIAGNOSIS — R6881 Early satiety: Secondary | ICD-10-CM | POA: Diagnosis not present

## 2015-09-10 DIAGNOSIS — R5383 Other fatigue: Secondary | ICD-10-CM | POA: Diagnosis not present

## 2015-09-10 DIAGNOSIS — I25119 Atherosclerotic heart disease of native coronary artery with unspecified angina pectoris: Secondary | ICD-10-CM

## 2015-09-10 LAB — NM MYOCAR MULTI W/SPECT W/WALL MOTION / EF
CHL CUP MPHR: 133 {beats}/min
CHL CUP NUCLEAR SRS: 2
CHL CUP NUCLEAR SSS: 2
CSEPED: 5 min
CSEPHR: 76 %
Estimated workload: 7 METS
Exercise duration (sec): 48 s
LHR: 0.38
LV dias vol: 129 mL (ref 62–150)
LV sys vol: 41 mL
NUC STRESS TID: 1.05
Peak HR: 120 {beats}/min
RPE: 13
Rest HR: 62 {beats}/min
SDS: 0

## 2015-09-10 LAB — ECHOCARDIOGRAM COMPLETE
AOPV: 0.75 m/s
AV Area VTI index: 1.01 cm2/m2
AV Mean grad: 5 mmHg
AV Peak grad: 13 mmHg
AV pk vel: 178 cm/s
AV vel: 2.12
AVAREAMEANV: 2.42 cm2
AVAREAMEANVIN: 1.15 cm2/m2
AVAREAVTI: 2.36 cm2
AVCELMEANRAT: 0.77
CHL CUP AV PEAK INDEX: 1.12
CHL CUP DOP CALC LVOT VTI: 26.7 cm
CHL CUP MV DEC (S): 225
CHL CUP STROKE VOLUME: 46 mL
CHL CUP TV REG PEAK VELOCITY: 116 cm/s
DOP CAL AO MEAN VELOCITY: 104 cm/s
EERAT: 7.09
EWDT: 225 ms
FS: 34 % (ref 28–44)
IV/PV OW: 0.98
LA vol A4C: 46.5 ml
LA vol: 55.6 mL
LADIAMINDEX: 1.81 cm/m2
LASIZE: 38 mm
LAVOLIN: 26.5 mL/m2
LDCA: 3.14 cm2
LEFT ATRIUM END SYS DIAM: 38 mm
LV E/e'average: 7.09
LV PW d: 12.2 mm — AB (ref 0.6–1.1)
LV dias vol: 70 mL (ref 62–150)
LV e' LATERAL: 13.1 cm/s
LV sys vol index: 12 mL/m2
LVDIAVOLIN: 33 mL/m2
LVEEMED: 7.09
LVOT SV: 84 mL
LVOT peak grad rest: 7 mmHg
LVOTD: 20 mm
LVOTPV: 134 cm/s
LVOTVTI: 0.67 cm
LVSYSVOL: 25 mL (ref 21–61)
MV Peak grad: 3 mmHg
MVPKAVEL: 75.6 m/s
MVPKEVEL: 92.9 m/s
Simpson's disk: 65
TAPSE: 23.5 mm
TDI e' lateral: 13.1
TDI e' medial: 10.2
TR max vel: 116 cm/s
VTI: 39.6 cm
Valve area index: 1.01
Valve area: 2.12 cm2

## 2015-09-10 MED ORDER — SODIUM CHLORIDE 0.9% FLUSH
INTRAVENOUS | Status: AC
  Administered 2015-09-10: 10 mL via INTRAVENOUS
  Filled 2015-09-10: qty 10

## 2015-09-10 MED ORDER — TECHNETIUM TC 99M TETROFOSMIN IV KIT
10.0000 | PACK | Freq: Once | INTRAVENOUS | Status: AC | PRN
  Administered 2015-09-10: 10.8 via INTRAVENOUS

## 2015-09-10 MED ORDER — TECHNETIUM TC 99M TETROFOSMIN IV KIT
30.0000 | PACK | Freq: Once | INTRAVENOUS | Status: AC | PRN
  Administered 2015-09-10: 29.7 via INTRAVENOUS

## 2015-09-10 MED ORDER — REGADENOSON 0.4 MG/5ML IV SOLN
INTRAVENOUS | Status: AC
  Administered 2015-09-10: 0.4 mg via INTRAVENOUS
  Filled 2015-09-10: qty 5

## 2015-09-10 NOTE — Progress Notes (Signed)
*  PRELIMINARY RESULTS* Echocardiogram 2D Echocardiogram has been performed.  Anthony Little 09/10/2015, 10:41 AM

## 2015-09-10 NOTE — Patient Instructions (Signed)
Do labs second week of August Follow up here in 3 months   DASH Eating Plan DASH stands for "Dietary Approaches to Stop Hypertension." The DASH eating plan is a healthy eating plan that has been shown to reduce high blood pressure (hypertension). Additional health benefits may include reducing the risk of type 2 diabetes mellitus, heart disease, and stroke. The DASH eating plan may also help with weight loss. WHAT DO I NEED TO KNOW ABOUT THE DASH EATING PLAN? For the DASH eating plan, you will follow these general guidelines:  Choose foods with a percent daily value for sodium of less than 5% (as listed on the food label).  Use salt-free seasonings or herbs instead of table salt or sea salt.  Check with your health care provider or pharmacist before using salt substitutes.  Eat lower-sodium products, often labeled as "lower sodium" or "no salt added."  Eat fresh foods.  Eat more vegetables, fruits, and low-fat dairy products.  Choose whole grains. Look for the word "whole" as the first word in the ingredient list.  Choose fish and skinless chicken or [place] more often than red meat. Limit fish, poultry, and meat to 6 oz (170 g) each day.  Limit sweets, desserts, sugars, and sugary drinks.  Choose heart-healthy fats.  Limit cheese to 1 oz (28 g) per day.  Eat more home-cooked food and less restaurant, buffet, and fast food.  Limit fried foods.  Cook foods using methods other than frying.  Limit canned vegetables. If you do use them, rinse them well to decrease the sodium.  When eating at a restaurant, ask that your food be prepared with less salt, or no salt if possible. WHAT FOODS CAN I EAT? Seek help from a dietitian for individual calorie needs. Grains Whole grain or whole wheat bread. Brown rice. Whole grain or whole wheat pasta. Quinoa, bulgur, and whole grain cereals. Low-sodium cereals. Corn or whole wheat flour tortillas. Whole grain cornbread. Whole grain crackers.  Low-sodium crackers. Vegetables Fresh or frozen vegetables (raw, steamed, roasted, or grilled). Low-sodium or reduced-sodium tomato and vegetable juices. Low-sodium or reduced-sodium tomato sauce and paste. Low-sodium or reduced-sodium canned vegetables.  Fruits All fresh, canned (in natural juice), or frozen fruits. Meat and Other Protein Products Ground beef (85% or leaner), grass-fed beef, or beef trimmed of fat. Skinless chicken or [place]. Ground chicken or [place]. Pork trimmed of fat. All fish and seafood. Eggs. Dried beans, peas, or lentils. Unsalted nuts and seeds. Unsalted canned beans. Dairy Low-fat dairy products, such as skim or 1% milk, 2% or reduced-fat cheeses, low-fat ricotta or cottage cheese, or plain low-fat yogurt. Low-sodium or reduced-sodium cheeses. Fats and Oils Tub margarines without trans fats. Light or reduced-fat mayonnaise and salad dressings (reduced sodium). Avocado. Safflower, olive, or canola oils. Natural peanut or almond butter. Other Unsalted popcorn and pretzels. The items listed above may not be a complete list of recommended foods or beverages. Contact your dietitian for more options. WHAT FOODS ARE NOT RECOMMENDED? Grains White bread. White pasta. White rice. Refined cornbread. Bagels and croissants. Crackers that contain trans fat. Vegetables Creamed or fried vegetables. Vegetables in a cheese sauce. Regular canned vegetables. Regular canned tomato sauce and paste. Regular tomato and vegetable juices. Fruits Dried fruits. Canned fruit in light or heavy syrup. Fruit juice. Meat and Other Protein Products Fatty cuts of meat. Ribs, chicken wings, bacon, sausage, bologna, salami, chitterlings, fatback, hot dogs, bratwurst, and packaged luncheon meats. Salted nuts and seeds. Canned beans with salt. Dairy  Whole or 2% milk, cream, half-and-half, and cream cheese. Whole-fat or sweetened yogurt. Full-fat cheeses or blue cheese. Nondairy creamers and whipped  toppings. Processed cheese, cheese spreads, or cheese curds. Condiments Onion and garlic salt, seasoned salt, table salt, and sea salt. Canned and packaged gravies. Worcestershire sauce. Tartar sauce. Barbecue sauce. Teriyaki sauce. Soy sauce, including reduced sodium. Steak sauce. Fish sauce. Oyster sauce. Cocktail sauce. Horseradish. Ketchup and mustard. Meat flavorings and tenderizers. Bouillon cubes. Hot sauce. Tabasco sauce. Marinades. Taco seasonings. Relishes. Fats and Oils Butter, stick margarine, lard, shortening, ghee, and bacon fat. Coconut, palm kernel, or palm oils. Regular salad dressings. Other Pickles and olives. Salted popcorn and pretzels. The items listed above may not be a complete list of foods and beverages to avoid. Contact your dietitian for more information. WHERE CAN I FIND MORE INFORMATION? National Heart, Lung, and Blood Institute: travelstabloid.com   This information is not intended to replace advice given to you by your health care provider. Make sure you discuss any questions you have with your health care provider.   Document Released: 02/16/2011 Document Revised: 03/20/2014 Document Reviewed: 01/01/2013 Elsevier Interactive Patient Education [employer].

## 2015-09-10 NOTE — Progress Notes (Addendum)
   Subjective:    Patient ID: Anthony Little, male    DOB: Jun 29, 1950, 65 y.o.   MRN: [id number]  HPI Patient arrives to discuss results of recent tests. 15-20 minutes spent discussing his lab work MRA MRI and discussing recent turn of events in the importance of good health He denies any new onset of any problems Review of Systems Patient denies any chest tightness pressure pain shortness breath does relate diminished eyesight to the left patient also relates feeling dizzy and off balance when he stands and walks.    Objective:   Physical Exam  Lungs clear hearts regular pulse normal BP checked twice good Patient was observed walking he can walk the hallway turn around without difficulty walking back     Assessment & Plan:  I believe this patient is getting closer to being able to return to work His MRI MRA shows old stroke but no new strokes He has seen the neurologist who has recommended vestibular training for the next 3 months to help him with his balance and dizziness. No new tests were ordered. 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 Currently not able to drive back-and-forth to work because with the visual difficulties and the dizziness the patient feels unsteady with his driving especially at nighttime. Currently is doing short distance driving without difficulty during the daytime. Patient to avoid nighttime driving currently but as he improves he can drive at night  The importance of keeping bad cholesterol under 70 discussed Continue 81 mg aspirin daily, continue regular activity, increase cardiovascular conditioning, follow-up lab work in a few weeks to look at cholesterol, follow-up office visit within 3 months with more comprehensive lab work in the fall, avoid excessive protein, urine protein  ordered Patient has seen cardiology they did stress testing and echo which had good results Patient states he still has early satiety he had EGD done in 2014 he does have a history of smoking as well as alcohol use. He had a CAT scan which was negative. Currently I wonder if the patient may need to have a repeat EGD or possibly gastric emptying study we will send a note to gastroenterology asking them to follow-up with the patient Currently I feel that the patient is disabled from working but should be able to return to work on a part-time basis for the initial 1-2 weeks if his job will allow him to do so. I have advised the patient initially to do daytime driving until he gains a comfort level. Patient is to give [place] feedback regarding his progress

## 2015-09-11 LAB — MICROALBUMIN / CREATININE URINE RATIO
CREATININE, UR: 176.7 mg/dL
MICROALB/CREAT RATIO: 13.1 mg/g creat (ref 0.0–30.0)
MICROALBUM., U, RANDOM: 23.1 ug/mL

## 2015-09-11 LAB — PLEASE NOTE

## 2015-09-12 ENCOUNTER — Encounter: Payer: Self-pay | Admitting: Family Medicine

## 2015-09-13 ENCOUNTER — Encounter: Payer: Self-pay | Admitting: Gastroenterology

## 2015-09-13 ENCOUNTER — Encounter: Payer: Self-pay | Admitting: Family Medicine

## 2015-09-13 NOTE — Telephone Encounter (Signed)
-----   Message from Satira Sark, MD sent at 09/10/2015  9:45 PM EDT ----- Results reviewed. Normal LVEF at 65-70%. Diastolic dysfunction (LV stiffness) likely associated with hypertension. A copy of this test should be forwarded to Sallee Lange, MD.

## 2015-09-13 NOTE — Progress Notes (Signed)
Referral to GI ordered in EPIC.

## 2015-09-13 NOTE — Addendum Note (Signed)
Addended by: Dairl Ponder on: 09/13/2015 08:33 AM   Modules accepted: Orders

## 2015-09-13 NOTE — Telephone Encounter (Signed)
Called pt  No answer no voice mail set up.

## 2015-09-16 NOTE — Telephone Encounter (Signed)
-----   Message from Satira Sark, MD sent at 09/10/2015  9:45 PM EDT ----- Results reviewed. Normal LVEF at 65-70%. Diastolic dysfunction (LV stiffness) likely associated with hypertension. A copy of this test should be forwarded to Sallee Lange, MD.

## 2015-09-16 NOTE — Telephone Encounter (Signed)
Called pt, cannot reach. Mailed letter.

## 2015-09-17 ENCOUNTER — Telehealth: Payer: Self-pay | Admitting: Cardiology

## 2015-09-17 NOTE — Telephone Encounter (Signed)
Called patient with test results. No answer. Left message to call back.  

## 2015-09-17 NOTE — Telephone Encounter (Signed)
Patient given ECHO results and voiced understanding

## 2015-09-17 NOTE — Telephone Encounter (Signed)
Pt would like to know the results from his test

## 2015-09-20 ENCOUNTER — Telehealth: Payer: Self-pay | Admitting: Family Medicine

## 2015-09-20 NOTE — Telephone Encounter (Signed)
Pt is wanting to return back to work either wed or thur. Please advise.

## 2015-09-20 NOTE — Telephone Encounter (Signed)
I would recommend the patient return to work on Thursday so that his work week would not be too long. He may have return to work notice for Thursday he should let us know if any problems

## 2015-09-21 ENCOUNTER — Encounter: Payer: Self-pay | Admitting: Family Medicine

## 2015-09-21 NOTE — Telephone Encounter (Signed)
Pt is made aware and will call back if any issues.  Letter ready for pick up

## 2015-09-29 ENCOUNTER — Ambulatory Visit (HOSPITAL_COMMUNITY): Payer: BLUE CROSS/BLUE SHIELD | Attending: Neurology | Admitting: Physical Therapy

## 2015-09-29 DIAGNOSIS — H8112 Benign paroxysmal vertigo, left ear: Secondary | ICD-10-CM

## 2015-09-29 DIAGNOSIS — R2681 Unsteadiness on feet: Secondary | ICD-10-CM

## 2015-09-30 ENCOUNTER — Ambulatory Visit (HOSPITAL_COMMUNITY): Payer: BLUE CROSS/BLUE SHIELD | Admitting: Physical Therapy

## 2015-09-30 DIAGNOSIS — H8112 Benign paroxysmal vertigo, left ear: Secondary | ICD-10-CM | POA: Diagnosis not present

## 2015-09-30 DIAGNOSIS — R2681 Unsteadiness on feet: Secondary | ICD-10-CM

## 2015-09-30 NOTE — Therapy (Signed)
[place], [place], [address] Phone: [phone]   Fax:  [phone]  Physical Therapy Evaluation  Patient Details  Name: Anthony Little MRN: [id number] Date of Birth: 01/20/1951 Referring Provider: Dr. Metta Clines  Encounter Date: 09/29/2015      PT End of Session - 09/29/15 1633    Visit Number 1   Number of Visits 8   Date for PT Re-Evaluation 10/29/15   Authorization Type BCBS   Authorization - Visit Number 1   Authorization - Number of Visits 8   PT Start Time 1520   PT Stop Time 1610   PT Time Calculation (min) 50 min   Equipment Utilized During Treatment Gait belt   Activity Tolerance Patient tolerated treatment well   Behavior During Therapy [facility] for tasks assessed/performed      Past Medical History  Diagnosis Date  . Essential hypertension   . Hyperlipidemia     Past Surgical History  Procedure Laterality Date  . Colonoscopy  12/2009    Diverticulosis, mild internal hemorrhoids, multiple colon polyps removed (adenomatous). One irregular shaped polyp was tattooed. Next colonoscopy due October 2016.  . Cardiovascular stress test  2002    negative  . Esophagogastroduodenoscopy N/A 10/08/2012    Procedure: ESOPHAGOGASTRODUODENOSCOPY (EGD);  Surgeon: Danie Binder, MD;  [facility];  Service: Endoscopy;  Laterality: N/A;  12:45    There were no vitals filed for this visit.       Subjective Assessment - 09/29/15 1524    Subjective Anthony Little is a 65 yo male who states that he has had vertigo for 2 1/2 years.  He states that looking up is affected the most.  He had a stroke on May 26th that affected his occular nerve.  He states that ever since the stroke he feels off balance.  He has been referred to physical therapy at this time to improve the stability of his gait.    Pertinent History HTN,    How long can you sit comfortably? no problem    How long can you stand  comfortably? able to stand for a few minutes    How long can you walk comfortably? Pt is able to walk constantly but feels unsteady    Currently in Pain? No/denies        09/29/15 0001  Assessment  Medical Diagnosis unsteadiness on feet due to CVA  Referring Provider Dr. Metta Clines  Onset Date/Surgical Date 08/06/15  Next MD Visit 12/1715  Prior Therapy none  Precautions  Precautions None  Restrictions  Weight Bearing Restrictions No  Balance Screen  Has the patient fallen in the past 6 months No  Has the patient had a decrease in activity level because of a fear of falling?  Yes  Is the patient reluctant to leave their home because of a fear of falling?  No  Prior Function  Level of Independence Independent  Vocation Full time employment  Development worker, community  Leisure wood working; painting; walking ,yard work   Clinical biochemist Status Within [employer] for tasks assessed  Observation/Other Assessments  Observations Pt is very stiff in spinal motion  Focus on Therapeutic Outcomes (FOTO)  56  ROM / Strength  AROM / PROM / Strength Strength  Strength  Strength Assessment Site Hip;Knee;Ankle  Right/Left Hip Right;Left  Right/Left Knee Right;Left  Right/Left Ankle Right;Left  Right Hip Flexion 5/5  Right Hip Extension 5/5  Right  Hip ABduction 5/5  Left Hip Flexion 5/5  Left Hip Extension 5/5  Left Hip ABduction 5/5  Right Knee Flexion 3+/5  Left Knee Flexion 4/5  Right Ankle Dorsiflexion 5/5  Left Ankle Dorsiflexion 5/5  Standardized Balance Assessment  Standardized Balance Assessment Berg Balance Test  Berg Balance Test  Sit to Stand 4  Standing Unsupported 4  Sitting with Back Unsupported but Feet Supported on Floor or Stool 4  Stand to Sit 4  Transfers 4  Standing Unsupported with Eyes Closed 4  Standing Ubsupported with Feet Together 4  From Standing, Reach Forward with Outstretched Arm 4  From Standing Position, Pick up  Object from Floor 4  From Standing Position, Turn to Look Behind Over each Shoulder 3  Turn 360 Degrees 2  Standing Unsupported, Alternately Place Feet on Step/Stool 4  Standing Unsupported, One Foot in Front 4  Standing on One Leg 4  Total Score 53       09/29/15 0001  Symptom Behavior  Type of Dizziness Imbalance  Frequency of Dizziness multiple times a day   Duration of Dizziness 10-30 seconds   Aggravating Factors Looking up to the ceiling  Relieving Factors Not applicable  Occulomotor Exam  Occulomotor Alignment Normal  Spontaneous Absent  Smooth Pursuits Intact  Saccades Intact  Positional Testing  Dix-Hallpike Dix-Hallpike Right;Dix-Hallpike Left  Dix-Hallpike Right  Dix-Hallpike Right Symptoms No nystagmus  Dix-Hallpike Left  Dix-Hallpike Left Duration 5  Dix-Hallpike Left Symptoms Upbeat, right rotatory nystagmus       09/29/15 0001  Vestibular Treatment/Exercise  Vestibular Treatment Provided Canalith Repositioning  Canalith Repositioning Epley Manuever Left  EPLEY MANUEVER LEFT  Number of Reps  2  Overall Response  No change         PT Education - 09/29/15 1638    Education provided Yes   Education Details cervical and thoracici excusions   Person(s) Educated Patient   Methods Explanation;Verbal cues;Handout   Comprehension Verbalized understanding          PT Short Term Goals - 09/30/15 0804    PT SHORT TERM GOAL #1   Title Pt to be able to roll over in bed without experiencing any dizziness   Time 2   Period Weeks   Status New   PT SHORT TERM GOAL #2   Title Pt to feel confident walking for 15 minutes at a normal pace without any unsteadiness   Time 2   Period Weeks   Status New   PT SHORT TERM GOAL #3   Title Pt to be able to verbalize the importance of keeping spinal flexibility including cervical in maintaining balance.   Time 2   Period Weeks   Status New           PT Long Term Goals - 09/30/15 0806    PT LONG TERM GOAL #1    Title Pt to be able to look up to perform tasks such as changing a light bulb independently   Time 4   Period Weeks   Status New   PT LONG TERM GOAL #2   Title Pt to be confident walking in his yard or on uneven ground for 20 minutes in order to complete yard work.   Time 4   Period Weeks   Status New   PT LONG TERM GOAL #3   Title Pt will feel confident turning his head to look for his blind spot while driving    Time 4   Period Weeks  Status New               Plan - 09/29/15 1634    Clinical Impression Statement Mr. Branscum is a 65 yo male who has suffered with vertigo, especially when looking up, for two years.  The symptoms were aggrevate by a stroke in May of this year.  He is now being referred for skilled physical therapy to decrease his symptoms and decrease his risk of falling.  Evaluation demonstrates decreased high level balance activities and a positive Hal-pike dix manuever.  He will benefit from skilled therapy for cannalith repostioning as well as high level balance activity.    Rehab Potential Good   PT Frequency 2x / week   PT Duration 4 weeks   PT Treatment/Interventions ADLs/Self Care Home Management;Canalith Repostioning;Functional mobility training;Therapeutic activities;Balance training;Therapeutic exercise;Neuromuscular re-education   PT Next Visit Plan begin session with balance; walking on foam with head turns; cone rotation on foam, yoga. End session with cannalith repositioning    PT Home Exercise Plan given    Consulted and Agree with Plan of Care Patient      Patient will benefit from skilled therapeutic intervention in order to improve the following deficits and impairments:  Decreased activity tolerance, Decreased balance, Other (comment), Dizziness, Abnormal gait  Visit Diagnosis: BPPV (benign paroxysmal positional vertigo), left - Plan: PT plan of care cert/re-cert  Unsteadiness on feet - Plan: PT plan of care  cert/re-cert     Problem List Patient Active Problem List   Diagnosis Date Noted  . HTN (hypertension) 06/08/2014  . Subdural hematoma ([place]) 09/04/2013  . Brain aneurysm 09/04/2013  . Alcohol abuse 09/04/2013  . Early satiety 10/02/2012  . Abnormal weight loss 10/02/2012  . Upper abdominal pain 10/02/2012    Rayetta Humphrey, PT CLT [phone] 09/30/2015, 8:58 AM  [place] [address] [place], [place], [address] Phone: [phone]   Fax:  [phone]  Name: SEITH SHINDER MRN: [id number] Date of Birth: June 08, 1950

## 2015-09-30 NOTE — Therapy (Signed)
Anthony Little, [place], [address] Phone: [phone]   Fax:  [phone]  Physical Therapy Treatment  Patient Details  Name: Anthony Little MRN: [id number] Date of Birth: [birth year] Referring Provider: Dr. Metta Clines  Encounter Date: 09/30/2015      PT End of Session - 09/30/15 1118    Visit Number 2   Number of Visits 8   Date for PT Re-Evaluation 10/29/15   Authorization Type BCBS   Authorization - Visit Number 2   Authorization - Number of Visits 8   PT Start Time [id number]   PT Stop Time 1115   PT Time Calculation (min) 40 min   Equipment Utilized During Treatment Gait belt   Activity Tolerance Patient tolerated treatment well   Behavior During Therapy [facility] for tasks assessed/performed      Past Medical History  Diagnosis Date  . Essential hypertension   . Hyperlipidemia     Past Surgical History  Procedure Laterality Date  . Colonoscopy  12/2009    Diverticulosis, mild internal hemorrhoids, multiple colon polyps removed (adenomatous). One irregular shaped polyp was tattooed. Next colonoscopy due October 2016.  . Cardiovascular stress test  2002    negative  . Esophagogastroduodenoscopy N/A 10/08/2012    Procedure: ESOPHAGOGASTRODUODENOSCOPY (EGD);  Surgeon: Danie Binder, MD;  [facility];  Service: Endoscopy;  Laterality: N/A;  12:45    There were no vitals filed for this visit.      Subjective Assessment - 09/30/15 1042    Subjective Pt states that after his nausea left he felt better after last treatment.    Pertinent History HTN,    How long can you sit comfortably? no problem    How long can you stand comfortably? able to stand for a few minutes    How long can you walk comfortably? Pt is able to walk constantly but feels unsteady                                                                                                                                       [facility] Adult PT Treatment/Exercise - 09/30/15 0001    Exercises   Exercises Neck   Neck Exercises: Seated   Other Seated Exercise scapular retraction x 10    Manual Therapy   Manual Therapy Soft tissue mobilization   Manual therapy comments done seperate from all other aspects of treatment    Soft tissue mobilization to B upper trap area to decrease spasm improve rotation.          Vestibular Treatment/Exercise - 09/30/15 0001    Vestibular Treatment/Exercise   Vestibular Treatment Provided Canalith Repositioning   Canalith Repositioning Epley Manuever Left    EPLEY MANUEVER LEFT   Number of Reps  2   Overall Response  No change  Balance Exercises - 09/30/15 1043    Balance Exercises: Standing   Standing Eyes Opened Narrow base of support (BOS);Head turns;5 reps   Tandem Stance Eyes open;Foam/compliant surface;30 secs   SLS Eyes open;4 reps   Rockerboard Anterior/posterior;Lateral;Other time (comment)  1 minute   Tandem Gait Forward;Foam/compliant surface;2 reps           PT Education - 09/29/15 1638    Education provided Yes   Education Details cervical and thoracici excusions   Person(s) Educated Patient   Methods Explanation;Verbal cues;Handout   Comprehension Verbalized understanding          PT Short Term Goals - 09/30/15 1120    PT SHORT TERM GOAL #1   Title Pt to be able to roll over in bed without experiencing any dizziness   Time 2   Period Weeks   Status On-going   PT SHORT TERM GOAL #2   Title Pt to feel confident walking for 15 minutes at a normal pace without any unsteadiness   Time 2   Period Weeks   Status On-going   PT SHORT TERM GOAL #3   Title Pt to be able to verbalize the importance of keeping spinal flexibility including cervical in maintaining balance.   Time 2   Period Weeks   Status On-going           PT Long Term Goals - 09/30/15 1120    PT LONG TERM GOAL #1   Title Pt to be  able to look up to perform tasks such as changing a light bulb independently   Time 4   Period Weeks   Status On-going   PT LONG TERM GOAL #2   Title Pt to be confident walking in his yard or on uneven ground for 20 minutes in order to complete yard work.   Time 4   Period Weeks   Status On-going   PT LONG TERM GOAL #3   Title Pt will feel confident turning his head to look for his blind spot while driving    Time 4   Period Weeks   Status On-going               Plan - 09/30/15 1118    Clinical Impression Statement Therapist gave and went over evaluation with patient.  Began balance activity with therapist facilitation for safety.  Manual for cannalith repostioning as well as soft tissue mobilization    Rehab Potential Good   PT Frequency 2x / week   PT Duration 4 weeks   PT Treatment/Interventions ADLs/Self Care Home Management;Canalith Repostioning;Functional mobility training;Therapeutic activities;Balance training;Therapeutic exercise;Neuromuscular re-education   PT Next Visit Plan begin  cone rotation on foam, yoga. End session with cannalith repositioning    PT Home Exercise Plan given    Consulted and Agree with Plan of Care Patient      Patient will benefit from skilled therapeutic intervention in order to improve the following deficits and impairments:  Decreased activity tolerance, Decreased balance, Other (comment), Dizziness, Abnormal gait  Visit Diagnosis: BPPV (benign paroxysmal positional vertigo), left  Unsteadiness on feet     Problem List Patient Active Problem List   Diagnosis Date Noted  . HTN (hypertension) 06/08/2014  . Subdural hematoma ([place]) 09/04/2013  . Brain aneurysm 09/04/2013  . Alcohol abuse 09/04/2013  . Early satiety 10/02/2012  . Abnormal weight loss 10/02/2012  . Upper abdominal pain 10/02/2012   Anthony Little, PT CLT [phone] 09/30/2015, 11:21 AM  [place], [place], [address] Phone: [phone]   Fax:  [phone]  Name: Anthony Little MRN: [id number] Date of Birth: 04/16/1950

## 2015-10-07 ENCOUNTER — Telehealth (HOSPITAL_COMMUNITY): Payer: Self-pay

## 2015-10-08 ENCOUNTER — Ambulatory Visit (HOSPITAL_COMMUNITY): Payer: BLUE CROSS/BLUE SHIELD | Admitting: Physical Therapy

## 2015-10-14 ENCOUNTER — Ambulatory Visit (HOSPITAL_COMMUNITY): Payer: BLUE CROSS/BLUE SHIELD | Attending: Family Medicine

## 2015-10-14 DIAGNOSIS — H8112 Benign paroxysmal vertigo, left ear: Secondary | ICD-10-CM | POA: Insufficient documentation

## 2015-10-14 DIAGNOSIS — R2681 Unsteadiness on feet: Secondary | ICD-10-CM | POA: Insufficient documentation

## 2015-10-14 NOTE — Therapy (Signed)
[place], [place], [address] Phone: [phone]   Fax:  [phone]  Physical Therapy Treatment  Patient Details  Name: Anthony Little MRN: [id number] Date of Birth: [birth year] Referring Provider: Dr. Metta Clines  Encounter Date: 10/14/2015      PT End of Session - 10/14/15 1122    Visit Number 3   Number of Visits 8   Date for PT Re-Evaluation 10/29/15   Authorization Type BCBS   Authorization - Visit Number 3   Authorization - Number of Visits 8   PT Start Time 1120   PT Stop Time 1200   PT Time Calculation (min) 40 min   Equipment Utilized During Treatment Gait belt   Activity Tolerance Patient tolerated treatment well   Behavior During Therapy [facility] for tasks assessed/performed      Past Medical History:  Diagnosis Date  . Essential hypertension   . Hyperlipidemia     Past Surgical History:  Procedure Laterality Date  . CARDIOVASCULAR STRESS TEST  2002   negative  . COLONOSCOPY  12/2009   Diverticulosis, mild internal hemorrhoids, multiple colon polyps removed (adenomatous). One irregular shaped polyp was tattooed. Next colonoscopy due October 2016.  [facility] ESOPHAGOGASTRODUODENOSCOPY N/A 10/08/2012   Procedure: ESOPHAGOGASTRODUODENOSCOPY (EGD);  Surgeon: Danie Binder, MD;  [facility];  Service: Endoscopy;  Laterality: N/A;  12:45    There were no vitals filed for this visit.      Subjective Assessment - 10/14/15 1121    Subjective Pt reports he is feeling good today, reports slight dizziness with standing up and rotating though subsided following.  Continues to complete HEP daily   Pertinent History HTN,    Patient Stated Goals Improve balance with loss of vision Lt eye   Currently in Pain? No/denies                         [facility] Adult PT Treatment/Exercise - 10/14/15 0001      Balance Poses: Yoga   Warrior I 2 reps;30 seconds   Warrior II 2 reps;30 seconds             Balance Exercises - 10/14/15 1135      Balance Exercises: Standing   Standing Eyes Opened Narrow base of support (BOS);Head turns;10 secs;2 reps  looking up    Tandem Stance Eyes open;Foam/compliant surface;3 reps   SLS Eyes open;3 reps  Rt 20', Lt 43" max of 3   Rockerboard Anterior/posterior;Lateral;Other time (comment)  2 min   Tandem Gait Forward;Retro;Foam/compliant surface;3 reps   Cone Rotation Foam/compliant surface;R/L             PT Short Term Goals - 09/30/15 1120      PT SHORT TERM GOAL #1   Title Pt to be able to roll over in bed without experiencing any dizziness   Time 2   Period Weeks   Status On-going     PT SHORT TERM GOAL #2   Title Pt to feel confident walking for 15 minutes at a normal pace without any unsteadiness   Time 2   Period Weeks   Status On-going     PT SHORT TERM GOAL #3   Title Pt to be able to verbalize the importance of keeping spinal flexibility including cervical in maintaining balance.   Time 2   Period Weeks   Status On-going  PT Long Term Goals - 09/30/15 1120      PT LONG TERM GOAL #1   Title Pt to be able to look up to perform tasks such as changing a light bulb independently   Time 4   Period Weeks   Status On-going     PT LONG TERM GOAL #2   Title Pt to be confident walking in his yard or on uneven ground for 20 minutes in order to complete yard work.   Time 4   Period Weeks   Status On-going     PT LONG TERM GOAL #3   Title Pt will feel confident turning his head to look for his blind spot while driving    Time 4   Period Weeks   Status On-going               Plan - 10/14/15 1203    Clinical Impression Statement Progressed balance activities on dynamic surface with head turns and rotation movements as well as yoga positions, therapist facilitation for safety through session with min A.  End of sessoin pt reports no dizziness with ability to demonstrate looking up to  ceiling and down at ground repeated reps with no symptoms.  Pt left with big smile on face.     Rehab Potential Good   PT Frequency 2x / week   PT Duration 4 weeks   PT Treatment/Interventions ADLs/Self Care Home Management;Canalith Repostioning;Functional mobility training;Therapeutic activities;Balance training;Therapeutic exercise;Neuromuscular re-education   PT Next Visit Plan Continue head movements on dynamic surface, rotation and yoga moves.  Begin tree pose next session.  End session with cannalith repositioning.        Patient will benefit from skilled therapeutic intervention in order to improve the following deficits and impairments:  Decreased activity tolerance, Decreased balance, Other (comment), Dizziness, Abnormal gait  Visit Diagnosis: BPPV (benign paroxysmal positional vertigo), left  Unsteadiness on feet     Problem List Patient Active Problem List   Diagnosis Date Noted  . HTN (hypertension) 06/08/2014  . Subdural hematoma ([place]) 09/04/2013  . Brain aneurysm 09/04/2013  . Alcohol abuse 09/04/2013  . Early satiety 10/02/2012  . Abnormal weight loss 10/02/2012  . Upper abdominal pain 10/02/2012   Anthony Little, LPTA; [place]  Anthony Little 10/14/2015, 12:13 PM  [place], [place], [address] Phone: [phone]   Fax:  [phone]  Name: Anthony Little MRN: [id number] Date of Birth: Sep 18, 1950

## 2015-10-19 ENCOUNTER — Ambulatory Visit (HOSPITAL_COMMUNITY): Payer: BLUE CROSS/BLUE SHIELD | Admitting: Physical Therapy

## 2015-10-19 DIAGNOSIS — H8112 Benign paroxysmal vertigo, left ear: Secondary | ICD-10-CM

## 2015-10-19 DIAGNOSIS — R2681 Unsteadiness on feet: Secondary | ICD-10-CM

## 2015-10-19 NOTE — Therapy (Addendum)
[place], [place], [address] Phone: [phone]   Fax:  [phone]  Physical Therapy Treatment  Patient Details  Name: Anthony Little MRN: [id number] Date of Birth: [birth year] Referring Provider: Dr. Metta Clines  Encounter Date: 10/19/2015      PT End of Session - 10/19/15 1123    Visit Number 4   Number of Visits 8   Date for PT Re-Evaluation 10/29/15   Authorization Type [employer]   Authorization - Visit Number 4   Authorization - Number of Visits 8   PT Start Time 7412   PT Stop Time 1118   PT Time Calculation (min) 40 min   Equipment Utilized During Treatment Gait belt   Activity Tolerance Patient tolerated treatment well   Behavior During Therapy [facility] for tasks assessed/performed      Past Medical History:  Diagnosis Date  . Essential hypertension   . Hyperlipidemia     Past Surgical History:  Procedure Laterality Date  . CARDIOVASCULAR STRESS TEST  2002   negative  . COLONOSCOPY  12/2009   Diverticulosis, mild internal hemorrhoids, multiple colon polyps removed (adenomatous). One irregular shaped polyp was tattooed. Next colonoscopy due October 2016.  Marland Kitchen ESOPHAGOGASTRODUODENOSCOPY N/A 10/08/2012   Procedure: ESOPHAGOGASTRODUODENOSCOPY (EGD);  Surgeon: Danie Binder, MD;  Location: [facility];  Service: Endoscopy;  Laterality: N/A;  12:45    There were no vitals filed for this visit.      Subjective Assessment - 10/19/15 1039    Subjective PT is completing HEP feeling better    Pertinent History HTN,    Patient Stated Goals Improve balance with loss of vision Lt eye   Currently in Pain? No/denies                         [facility] Adult PT Treatment/Exercise - 10/19/15 0001      Balance Poses: Yoga   Warrior I 2 reps;30 seconds   Tree Pose 2 reps;30 seconds             Balance Exercises - 10/19/15 1102      Balance Exercises: Standing   Standing Eyes Closed Wide  (BOA);Foam/compliant surface;2 reps;30 secs  wobble board    SLS Eyes open;Foam/compliant surface;3 reps   Balance Beam Round trip x 2 with head turns. retro x 2 reps head nods.    Tandem Gait Forward;Retro;Foam/compliant surface;3 reps  With head turns    Cone Rotation Foam/compliant surface;Right turn;Left turn   Heel Raises Limitations 10 with squat and raising yellow weighted ball looking up to ceiling.    Other Standing Exercises cones in an arch in front of pt reach down and pick up cones; turn 360 to rt then to left x 2            PT Education - 10/19/15 1120    Education provided Yes   Education Details keep completing cervical ROM to gain full rotation, the importance of arm swing when walking for improved balance.   Person(s) Educated Patient   Methods Explanation   Comprehension Verbalized understanding          PT Short Term Goals - 10/19/15 1128      PT SHORT TERM GOAL #1   Title Pt to be able to roll over in bed without experiencing any dizziness   Time 2   Period Weeks   Status Partially Met     PT SHORT  TERM GOAL #2   Title Pt to feel confident walking for 15 minutes at a normal pace without any unsteadiness   Time 2   Period Weeks   Status Partially Met     PT SHORT TERM GOAL #3   Title Pt to be able to verbalize the importance of keeping spinal flexibility including cervical in maintaining balance.   Time 2   Period Weeks   Status Achieved           PT Long Term Goals - 10/19/15 1129      PT LONG TERM GOAL #1   Title Pt to be able to look up to perform tasks such as changing a light bulb independently   Time 4   Period Weeks   Status Partially Met     PT LONG TERM GOAL #2   Title Pt to be confident walking in his yard or on uneven ground for 20 minutes in order to complete yard work.   Time 4   Period Weeks   Status Partially Met     PT LONG TERM GOAL #3   Title Pt will feel confident turning his head to look for his blind spot while  driving    Time 4   Period Weeks   Status Partially Met               Plan - 10/19/15 1123    Clinical Impression Statement Pt progressed using wobble discs as well as turning 360 and bending down to pick up cones off the floor.  All exercises were completed with therapist facilitation for proper technique and safety.  Pt is progressing well and feeling more steady when ambulating.    Rehab Potential Good   PT Frequency 2x / week   PT Duration 4 weeks   PT Treatment/Interventions ADLs/Self Care Home Management;Canalith Repostioning;Functional mobility training;Therapeutic activities;Balance training;Therapeutic exercise;Neuromuscular re-education   PT Next Visit Plan begin hurdle steps on balance beam .       Patient will benefit from skilled therapeutic intervention in order to improve the following deficits and impairments:  Decreased activity tolerance, Decreased balance, Other (comment), Dizziness, Abnormal gait  Visit Diagnosis: BPPV (benign paroxysmal positional vertigo), left  Unsteadiness on feet     Problem List Patient Active Problem List   Diagnosis Date Noted  . HTN (hypertension) 06/08/2014  . Subdural hematoma ([place]) 09/04/2013  . Brain aneurysm 09/04/2013  . Alcohol abuse 09/04/2013  . Early satiety 10/02/2012  . Abnormal weight loss 10/02/2012  . Upper abdominal pain 10/02/2012    Rayetta Humphrey, PT CLT [phone] 10/19/2015, 11:30 AM  [place], [place], [address] Phone: [phone]   Fax:  [phone]  Name: Anthony Little MRN: [id number] Date of Birth: 18-Aug-1950  PHYSICAL THERAPY DISCHARGE SUMMARY  Visits from Start of Care: 4  Current functional level related to goals / functional outcomes: See above   Remaining deficits: Occasional dizziness   Education / Equipment: HEP Plan: Patient agrees to discharge.  Patient goals were partially met. Patient is being  discharged due to the patient's request.  ?????       Pt has started working days at work and can not get here on time.  Requests to be discharge.  Rayetta Humphrey, [place] CLT [phone]

## 2015-10-20 ENCOUNTER — Telehealth (HOSPITAL_COMMUNITY): Payer: Self-pay | Admitting: Physical Therapy

## 2015-10-20 NOTE — Telephone Encounter (Signed)
He had a conflicting apptment

## 2015-10-21 ENCOUNTER — Ambulatory Visit (INDEPENDENT_AMBULATORY_CARE_PROVIDER_SITE_OTHER): Payer: BLUE CROSS/BLUE SHIELD | Admitting: Gastroenterology

## 2015-10-21 ENCOUNTER — Encounter: Payer: Self-pay | Admitting: Gastroenterology

## 2015-10-21 ENCOUNTER — Other Ambulatory Visit: Payer: Self-pay

## 2015-10-21 ENCOUNTER — Encounter (HOSPITAL_COMMUNITY): Payer: BLUE CROSS/BLUE SHIELD | Admitting: Physical Therapy

## 2015-10-21 VITALS — BP 151/82 | HR 67 | Temp 97.6°F | Ht 70.0 in | Wt 198.8 lb

## 2015-10-21 DIAGNOSIS — G8929 Other chronic pain: Secondary | ICD-10-CM

## 2015-10-21 DIAGNOSIS — R6881 Early satiety: Secondary | ICD-10-CM

## 2015-10-21 DIAGNOSIS — Z86010 Personal history of colonic polyps: Secondary | ICD-10-CM

## 2015-10-21 DIAGNOSIS — R1011 Right upper quadrant pain: Secondary | ICD-10-CM

## 2015-10-21 DIAGNOSIS — R1010 Upper abdominal pain, unspecified: Secondary | ICD-10-CM

## 2015-10-21 NOTE — Progress Notes (Addendum)
REVIEWED-NO ADDITIONAL RECOMMENDATIONS.   Primary Care Physician: Sallee Lange, MD  Primary Gastroenterologist:  Barney Drain, MD   Chief Complaint  Patient presents with  . Bloated    bloated after eating    HPI: Anthony Little is a 65 y.o. male here at the request of Dr. Wolfgang Phoenix for further evaluation of early satiety. Patient last seen by her practice in 2014. EGD at that time showed gastritis/duodenitis. He was due for surveillance colonoscopy in 2016 for tubular adenomas.  Patient presents today with one-year history of progressive upper abdominal discomfort/bloating associated with early satiety. Patient reports a 10 pound weight loss. Reported weight of 214 pounds back in April 2017 per Epic. Weight 198 pounds today. Really denies heartburn. Complains of a lot of gas. Soft BM every 3-4 days. No melena rectal bleeding. Rarely takes NSAIDs. Notes his pain sometimes radiates into the back from the upper abdomen. He consumes alcohol daily.   CTA of the abdomen and pelvis with contrast back in May showed normal pancreas. Normal gallbladder. No biliary dilation. He has a left lower lobe solitary pulmonary nodule which is being followed by Dr. Wolfgang Phoenix. Lipase is minimally elevated at 73 back in April. His LFTs have been normal.     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 Associated dizziness, left peripheral vision loss. He recently just was able to go back to work.  Current Outpatient Prescriptions  Medication Sig Dispense Refill  . amLODipine (NORVASC) 5 MG tablet TAKE 1 TABLET (5 MG TOTAL) BY MOUTH DAILY. 30 tablet 5  . aspirin 81 MG tablet Take 81 mg by mouth daily.    Marland Kitchen atorvastatin (LIPITOR) 10 MG tablet Take 1 tablet (10 mg total) by mouth daily. 30 tablet 3  . Cyanocobalamin (VITAMIN B-12 CR PO) Take by mouth.    . mometasone (ELOCON) 0.1 % cream Apply to affected area bid prn 45 g 1   No current facility-administered medications for  this visit.     Allergies as of 10/21/2015  . (No Known Allergies)   Past Medical History:  Diagnosis Date  . CVA (cerebral infarction)   . Essential hypertension   . Hyperlipidemia    Past Surgical History:  Procedure Laterality Date  . CARDIOVASCULAR STRESS TEST  2002   negative  . COLONOSCOPY  12/2009   Diverticulosis, mild internal hemorrhoids, multiple colon polyps removed (adenomatous). One irregular shaped polyp was tattooed. Next colonoscopy due October 2016.  Marland Kitchen ESOPHAGOGASTRODUODENOSCOPY N/A 10/08/2012   SLF: Moderate erosive gastritis/duodenitis. Biopsies benign.   Family History  Problem Relation Age of Onset  . Diabetes Mother   . Diabetes Maternal Grandfather   . Ulcers Neg Hx   . Colon cancer Neg Hx   . Parkinson's disease Father    Social History   Social History  . Marital status: Married    Spouse name: N/A  . Number of children: N/A  . Years of education: N/A   Occupational History  . [employer]    Social History Main Topics  . Smoking status: Current Every Day Smoker    Packs/day: 1.00    Types: Cigarettes    Start date: 10/04/2015  . Smokeless tobacco: Former Systems developer    Quit date: 01/07/2015     Comment: he would like to but he knows it is hard   . Alcohol use 0.0 oz/week     Comment: liquor 5 ounces per night, long-time  . Drug use: No  .  Sexual activity: No   Other Topics Concern  . None   Social History Narrative  . None    ROS:  General: Negative for anorexia,   fever, chills, fatigue, weakness. See hpi ENT: Negative for hoarseness, difficulty swallowing , nasal congestion. CV: Negative for chest pain, angina, palpitations, dyspnea on exertion, peripheral edema.  Respiratory: Negative for dyspnea at rest, dyspnea on exertion, cough, sputum, wheezing.  GI: See history of present illness. GU:  Negative for dysuria, hematuria, urinary incontinence, urinary frequency, nocturnal urination.  Endo: see hpi   Physical  Examination:   BP (!) 151/82   Pulse 67   Temp 97.6 F (36.4 C) (Oral)   Ht 5\' 10"  (1.778 m)   Wt 198 lb 12.8 oz (90.2 kg)   BMI 28.52 kg/m   General: Well-nourished, well-developed in no acute distress.  Eyes: No icterus. Mouth: Oropharyngeal mucosa moist and pink , no lesions erythema or exudate. Lungs: Clear to auscultation bilaterally.  Heart: Regular rate and rhythm, no murmurs rubs or gallops.  Abdomen: Bowel sounds are normal, nontender, nondistended, no hepatosplenomegaly or masses, no abdominal bruits or hernia , no rebound or guarding.   Extremities: No lower extremity edema. No clubbing or deformities. Neuro: Alert and oriented x 4   Skin: Warm and dry, no jaundice.   Psych: Alert and cooperative, normal mood and affect.  Labs:  Lab Results  Component Value Date   CREATININE 1.80 (H) 08/18/2015   BUN 40 (H) 08/18/2015   NA 133 (L) 08/18/2015   K 4.8 08/18/2015   CL 96 08/18/2015   CO2 21 08/18/2015   Lab Results  Component Value Date   ALT 12 08/13/2015   AST 16 08/13/2015   ALKPHOS 57 08/13/2015   BILITOT 0.4 08/13/2015   Lab Results  Component Value Date   WBC 7.0 08/06/2015   HGB 13.6 08/06/2015   HCT 41.9 08/06/2015   MCV 102.2 (H) 08/06/2015   PLT 225 08/06/2015   Lab Results  Component Value Date   VITAMINB12 403 08/16/2012   Lab Results  Component Value Date   LIPASE 73 (H) 07/08/2015    Imaging Studies: No results found.

## 2015-10-21 NOTE — Patient Instructions (Signed)
1. Colonoscopy and upper endoscopy as scheduled. Please see separate instructions. 

## 2015-10-22 ENCOUNTER — Encounter: Payer: Self-pay | Admitting: Gastroenterology

## 2015-10-22 NOTE — Assessment & Plan Note (Signed)
65 year old gentleman with one-year history of progressive upper abdominal discomfort associated with bloating, pain radiating into the back, 10 pound weight loss. He drinks 5 ounces of liquor daily. In April he had a minimally elevated lipase. CT in May was unremarkable, and gallbladder/biliary system/pancreas appear normal. Denies NSAID use. Presents for consideration of upper endoscopy per PCP request. In addition he is overdue for colonoscopy for surveillance purposes given history of adenomatous colon polyps.  Plan for EGD/colonoscopy in the near future. Phenergan 25 mg IV 30 mins before the procedure to augment conscious sedation given history of daily alcohol use. Patient had adequate sedation previously with this method. Patient declined deep sedation in the OR. I have discussed the risks, alternatives, benefits with regards to but not limited to the risk of reaction to medication, bleeding, infection, perforation and the patient is agreeable to proceed. Written consent to be obtained.  Encouraged cutting back on alcohol use.

## 2015-10-22 NOTE — Progress Notes (Signed)
cc'ed to pcp °

## 2015-10-26 ENCOUNTER — Encounter (HOSPITAL_COMMUNITY): Payer: BLUE CROSS/BLUE SHIELD | Admitting: Physical Therapy

## 2015-10-28 ENCOUNTER — Ambulatory Visit (HOSPITAL_COMMUNITY): Payer: BLUE CROSS/BLUE SHIELD | Admitting: Physical Therapy

## 2015-10-28 ENCOUNTER — Telehealth (HOSPITAL_COMMUNITY): Payer: Self-pay

## 2015-10-28 NOTE — Telephone Encounter (Signed)
cx - he left a message stating that his work had moved him to day shift and he needed to cx all future appts.

## 2015-11-01 ENCOUNTER — Encounter (HOSPITAL_COMMUNITY)
Admission: RE | Disposition: A | Discharge status: Home / Self Care | Payer: Self-pay | Source: Ambulatory Visit | Attending: Gastroenterology

## 2015-11-01 ENCOUNTER — Encounter (HOSPITAL_COMMUNITY): Payer: Self-pay | Admitting: *Deleted

## 2015-11-01 ENCOUNTER — Ambulatory Visit (HOSPITAL_COMMUNITY)
Admission: RE | Admit: 2015-11-01 | Discharge: 2015-11-01 | Disposition: A | Discharge status: Home / Self Care | Payer: BLUE CROSS/BLUE SHIELD | Source: Ambulatory Visit | Attending: Gastroenterology | Admitting: Gastroenterology

## 2015-11-01 DIAGNOSIS — Z833 Family history of diabetes mellitus: Secondary | ICD-10-CM | POA: Diagnosis not present

## 2015-11-01 DIAGNOSIS — R1010 Upper abdominal pain, unspecified: Secondary | ICD-10-CM | POA: Diagnosis not present

## 2015-11-01 DIAGNOSIS — D124 Benign neoplasm of descending colon: Secondary | ICD-10-CM | POA: Insufficient documentation

## 2015-11-01 DIAGNOSIS — K635 Polyp of colon: Secondary | ICD-10-CM | POA: Diagnosis not present

## 2015-11-01 DIAGNOSIS — Z820 Family history of epilepsy and other diseases of the nervous system: Secondary | ICD-10-CM | POA: Insufficient documentation

## 2015-11-01 DIAGNOSIS — K648 Other hemorrhoids: Secondary | ICD-10-CM | POA: Insufficient documentation

## 2015-11-01 DIAGNOSIS — K5730 Diverticulosis of large intestine without perforation or abscess without bleeding: Secondary | ICD-10-CM | POA: Insufficient documentation

## 2015-11-01 DIAGNOSIS — F17210 Nicotine dependence, cigarettes, uncomplicated: Secondary | ICD-10-CM | POA: Diagnosis not present

## 2015-11-01 DIAGNOSIS — Q438 Other specified congenital malformations of intestine: Secondary | ICD-10-CM | POA: Diagnosis not present

## 2015-11-01 DIAGNOSIS — D122 Benign neoplasm of ascending colon: Secondary | ICD-10-CM | POA: Diagnosis not present

## 2015-11-01 DIAGNOSIS — D123 Benign neoplasm of transverse colon: Secondary | ICD-10-CM | POA: Diagnosis not present

## 2015-11-01 DIAGNOSIS — K269 Duodenal ulcer, unspecified as acute or chronic, without hemorrhage or perforation: Secondary | ICD-10-CM | POA: Insufficient documentation

## 2015-11-01 DIAGNOSIS — Z8673 Personal history of transient ischemic attack (TIA), and cerebral infarction without residual deficits: Secondary | ICD-10-CM | POA: Insufficient documentation

## 2015-11-01 DIAGNOSIS — Z7982 Long term (current) use of aspirin: Secondary | ICD-10-CM | POA: Diagnosis not present

## 2015-11-01 DIAGNOSIS — E785 Hyperlipidemia, unspecified: Secondary | ICD-10-CM | POA: Insufficient documentation

## 2015-11-01 DIAGNOSIS — Z1211 Encounter for screening for malignant neoplasm of colon: Secondary | ICD-10-CM | POA: Diagnosis present

## 2015-11-01 DIAGNOSIS — I10 Essential (primary) hypertension: Secondary | ICD-10-CM | POA: Insufficient documentation

## 2015-11-01 DIAGNOSIS — Z79899 Other long term (current) drug therapy: Secondary | ICD-10-CM | POA: Insufficient documentation

## 2015-11-01 DIAGNOSIS — Z86010 Personal history of colonic polyps: Secondary | ICD-10-CM | POA: Diagnosis not present

## 2015-11-01 DIAGNOSIS — K2950 Unspecified chronic gastritis without bleeding: Secondary | ICD-10-CM | POA: Diagnosis not present

## 2015-11-01 DIAGNOSIS — K2970 Gastritis, unspecified, without bleeding: Secondary | ICD-10-CM | POA: Diagnosis not present

## 2015-11-01 DIAGNOSIS — G8929 Other chronic pain: Secondary | ICD-10-CM

## 2015-11-01 DIAGNOSIS — R1011 Right upper quadrant pain: Secondary | ICD-10-CM

## 2015-11-01 HISTORY — PX: ESOPHAGOGASTRODUODENOSCOPY: SHX5428

## 2015-11-01 HISTORY — PX: COLONOSCOPY: SHX5424

## 2015-11-01 SURGERY — COLONOSCOPY
Anesthesia: Moderate Sedation

## 2015-11-01 MED ORDER — PROMETHAZINE HCL 25 MG/ML IJ SOLN
25.0000 mg | Freq: Once | INTRAMUSCULAR | Status: AC
  Administered 2015-11-01: 25 mg via INTRAVENOUS

## 2015-11-01 MED ORDER — MIDAZOLAM HCL 5 MG/5ML IJ SOLN
INTRAMUSCULAR | Status: DC | PRN
  Administered 2015-11-01: 1 mg via INTRAVENOUS
  Administered 2015-11-01 (×4): 2 mg via INTRAVENOUS

## 2015-11-01 MED ORDER — SODIUM CHLORIDE 0.9 % IV SOLN
INTRAVENOUS | Status: DC
  Administered 2015-11-01: 12:00:00 via INTRAVENOUS

## 2015-11-01 MED ORDER — PROMETHAZINE HCL 25 MG/ML IJ SOLN
INTRAMUSCULAR | Status: AC
  Filled 2015-11-01: qty 1

## 2015-11-01 MED ORDER — LIDOCAINE VISCOUS 2 % MT SOLN
OROMUCOSAL | Status: DC
Start: 2015-11-01 — End: 2015-11-01
  Filled 2015-11-01: qty 15

## 2015-11-01 MED ORDER — SODIUM CHLORIDE 0.9% FLUSH
INTRAVENOUS | Status: AC
  Filled 2015-11-01: qty 10

## 2015-11-01 MED ORDER — MIDAZOLAM HCL 5 MG/5ML IJ SOLN
INTRAMUSCULAR | Status: AC
  Filled 2015-11-01: qty 10

## 2015-11-01 MED ORDER — STERILE WATER FOR IRRIGATION IR SOLN
Status: DC | PRN
  Administered 2015-11-01: 14:00:00

## 2015-11-01 MED ORDER — ATROPINE SULFATE 1 MG/ML IJ SOLN
INTRAMUSCULAR | Status: DC | PRN
  Administered 2015-11-01: .5 mg via INTRAVENOUS

## 2015-11-01 MED ORDER — MEPERIDINE HCL 100 MG/ML IJ SOLN
INTRAMUSCULAR | Status: AC
  Filled 2015-11-01: qty 2

## 2015-11-01 MED ORDER — LIDOCAINE VISCOUS 2 % MT SOLN
OROMUCOSAL | Status: DC | PRN
  Administered 2015-11-01: 1 via OROMUCOSAL

## 2015-11-01 MED ORDER — OMEPRAZOLE 20 MG PO CPDR: DELAYED_RELEASE_CAPSULE | ORAL | 11 refills | Status: DC

## 2015-11-01 MED ORDER — ATROPINE SULFATE 1 MG/ML IJ SOLN
INTRAMUSCULAR | Status: AC
  Filled 2015-11-01: qty 1

## 2015-11-01 MED ORDER — MEPERIDINE HCL 100 MG/ML IJ SOLN
INTRAMUSCULAR | Status: DC | PRN
  Administered 2015-11-01: 25 mg
  Administered 2015-11-01: 50 mg
  Administered 2015-11-01: 25 mg

## 2015-11-01 NOTE — Op Note (Signed)
[facility] Patient Name: Anthony Little Procedure Date: 11/01/2015 2:25 PM MRN: [id number] Date of Birth: 1950-10-31 Attending MD: Barney Drain , MD CSN: [id number] Age: 65 Admit Type: Outpatient Procedure:                Upper GI endoscopy WITH COLD FORCEPS BIOPSY Indications:              Abdominal pain in the right upper quadrant, Weight                            loss-ON ASA WITHOUT A PPI Providers:                Barney Drain, MD, [name], RN, Georgeann Oppenheim,                            Technician Referring MD:             Elayne Snare. Luking Medicines:                TCS + Midazolam 2 mg IV Complications:            No immediate complications. Estimated Blood Loss:     Estimated blood loss was minimal. Procedure:                Pre-Anesthesia Assessment:                           - Prior to the procedure, a History and Physical                            was performed, and patient medications and                            allergies were reviewed. The patient's tolerance of                            previous anesthesia was also reviewed. The risks                            and benefits of the procedure and the sedation                            options and risks were discussed with the patient.                            All questions were answered, and informed consent                            was obtained. Prior Anticoagulants: The patient has                            taken aspirin, last dose was day of procedure. ASA                            Grade Assessment: II - A patient with mild systemic  disease. After reviewing the risks and benefits,                            the patient was deemed in satisfactory condition to                            undergo the procedure.                           - Prior to the procedure, a History and Physical                            was performed, and patient medications and     allergies were reviewed. The patient's tolerance of                            previous anesthesia was also reviewed. The risks                            and benefits of the procedure and the sedation                            options and risks were discussed with the patient.                            All questions were answered, and informed consent                            was obtained. Prior Anticoagulants: The patient has                            taken aspirin, last dose was day of procedure. ASA                            Grade Assessment: II - A patient with mild systemic                            disease. After reviewing the risks and benefits,                            the patient was deemed in satisfactory condition to                            undergo the procedure. After obtaining informed                            consent, the endoscope was passed under direct                            vision. Throughout the procedure, the patient's                            blood pressure, pulse, and oxygen saturations were  monitored continuously. The [id number]                            scope was introduced through the mouth, and                            advanced to the second part of duodenum. The upper                            GI endoscopy was accomplished without difficulty.                            The patient tolerated the procedure well. Scope In: 2:30:33 PM Scope Out: 2:37:28 PM Total Procedure Duration: 0 hours 6 minutes 55 seconds  Findings:      The examined esophagus was normal.      Patchy moderate inflammation characterized by congestion (edema),       erosions, erythema and target ulcerations was found in the entire       examined stomach. Biopsies were taken with a cold forceps for       Helicobacter pylori testing.      Few non-bleeding cratered duodenal ulcers with no stigmata of bleeding       were found in the  duodenal bulb.      The second portion of the duodenum was normal. Impression:               - RUQ ABDOMINAL PAIN DUE TO EROSIVE                            GASTRITIS/DUODENITIS AND DUODENAL ULCERS Moderate Sedation:      Moderate (conscious) sedation was administered by the endoscopy nurse       and supervised by the endoscopist. The following parameters were       monitored: oxygen saturation, heart rate, blood pressure, and response       to care. Total physician intraservice time was 52 minutes. Recommendation:           - High fiber diet and low fat diet.                           - Use Prilosec (omeprazole) 20 mg PO BID FOR 3 MOS                            THNE ONCE DAILY.                           - No aspirin, ibuprofen, naproxen, or other                            non-steroidal anti-inflammatory drugs for 7 days                            after biopsy.                           - Await pathology results.                           -  Return to my office in 4 months.                           - Patient has a contact number available for                            emergencies. The signs and symptoms of potential                            delayed complications were discussed with the                            patient. Return to normal activities tomorrow.                            Written discharge instructions were provided to the                            patient. Procedure Code(s):        --- Professional ---                           [phone], Esophagogastroduodenoscopy, flexible,                            transoral; with biopsy, single or multiple                           99152, Moderate sedation services provided by the                            same physician or other qualified health care                            professional performing the diagnostic or                            therapeutic service that the sedation supports,                            requiring the  presence of an independent trained                            observer to assist in the monitoring of the                            patient's level of consciousness and physiological                            status; initial 15 minutes of intraservice time,                            patient age 10 years or older  [id number], Moderate sedation services; each additional                            15 minutes intraservice time                           99153, Moderate sedation services; each additional                            15 minutes intraservice time Diagnosis Code(s):        --- Professional ---                           K29.70, Gastritis, unspecified, without bleeding                           K26.9, Duodenal ulcer, unspecified as acute or                            chronic, without hemorrhage or perforation                           R10.11, Right upper quadrant pain                           R63.4, Abnormal weight loss CPT copyright 2016 American Medical [employer]. All rights reserved. The codes documented in this report are preliminary and upon coder review may  be revised to meet current compliance requirements. Barney Drain, MD Barney Drain, MD 11/01/2015 2:56:06 PM This report has been signed electronically. Number of Addenda: 0

## 2015-11-01 NOTE — Discharge Instructions (Signed)
YOUR UPPER ENDOSCOPY SHOWED A small HIATAL HERNIA, erosive GASTRITIS & duodenitis AND DUODENAL ULCERS DUE TO ASPIRIN USE. WHEN SOME PEOPLE TAKE  ASPIRIN USE AND DON'T TAKE A MEDICINE TO PROTECT YOUR STOMACH ULCER FORM. THIS IS THE CAUSE FOR YOUR UPPER ABDOMINAL PAIN. YOU HAD FOUR POLYPS REMOVED.  You have internal hemorrhoids. I biopsied your stomach.   HOLD ASPIRIN. RE-START AUG 28.  START OMEPRAZOLE TWICE DAILY.  AVOID ITEMS THAT TRIGGER GASTRITIS/ULCERS. SEE INFO BELOW.  FOLLOW A HIGH FIBER.  AVOID ITEMS THAT CAUSE BLOATING. SEE INFO BELOW.  YOUR BIOPSY RESULTS WILL BE AVAILABLE IN MY CHART AFTER AUG 24 AND OR MY OFFICE WILL CONTACT YOU IN 10-14 DAYS WITH YOUR RESULTS.   FOLLOW UP IN 4 MOS.   ENDOSCOPY Care After Read the instructions outlined below and refer to this sheet in the next week. These discharge instructions provide you with general information on caring for yourself after you leave the hospital. While your treatment has been planned according to the most current medical practices available, unavoidable complications occasionally occur. If you have any problems or questions after discharge, call DR. Kaylyne Axton, [phone].  ACTIVITY  You may resume your regular activity, but move at a slower pace for the next 24 hours.   Take frequent rest periods for the next 24 hours.   Walking will help get rid of the air and reduce the bloated feeling in your belly (abdomen).   No driving for 24 hours (because of the medicine (anesthesia) used during the test).   You may shower.   Do not sign any important legal documents or operate any machinery for 24 hours (because of the anesthesia used during the test).    NUTRITION  Drink plenty of fluids.   You may resume your normal diet as instructed by your doctor.   Begin with a light meal and progress to your normal diet. Heavy or fried foods are harder to digest and may make you feel sick to your stomach (nauseated).   Avoid  alcoholic beverages for 24 hours or as instructed.    MEDICATIONS  You may resume your normal medications.   WHAT YOU CAN EXPECT TODAY  Some feelings of bloating in the abdomen.   Passage of more gas than usual.   Spotting of blood in your stool or on the toilet paper  .  IF YOU HAD POLYPS REMOVED DURING THE ENDOSCOPY:  Eat a soft diet IF YOU HAVE NAUSEA, BLOATING, ABDOMINAL PAIN, OR VOMITING.    FINDING OUT THE RESULTS OF YOUR TEST Not all test results are available during your visit. DR. Oneida Alar WILL CALL YOU WITHIN 14 DAYS OF YOUR PROCEDUE WITH YOUR RESULTS. Do not assume everything is normal if you have not heard from DR. Vara Mairena, CALL HER OFFICE AT [phone].  SEEK IMMEDIATE MEDICAL ATTENTION AND CALL THE OFFICE: [phone] IF:  You have more than a spotting of blood in your stool.   Your belly is swollen (abdominal distention).   You are nauseated or vomiting.   You have a temperature over 101F.   You have abdominal pain or discomfort that is severe or gets worse throughout the day.   Gastritis/DUODENITIS  Gastritis is an inflammation (the body's way of reacting to injury and/or infection) of the stomach. DUODENITIS is an inflammation (the body's way of reacting to injury and/or infection) of the FIRST PART OF THE SMALL INTESTINES. It is often caused by bacterial (germ) infections. It can also be caused BY ASPIRIN, BC/GOODY POWDER'S, (  IBUPROFEN) MOTRIN, OR ALEVE (NAPROXEN), chemicals (including alcohol), SPICY FOODS, and medications. This illness may be associated with generalized malaise (feeling tired, not well), UPPER ABDOMINAL STOMACH cramps, and fever. One common bacterial cause of gastritis is an organism known as H. Pylori. This can be treated with antibiotics.   Ulcer Disease (Peptic Ulcer, Gastric Ulcer, Duodenal Ulcer) You have an ulcer. This may be in your stomach (gastric ulcer) or in the first part of your small bowel, the duodenum (duodenal ulcer).  An ulcer is a break in the lining of the stomach or duodenum. The ulcer causes erosion into the deeper tissue.  CAUSES The stomach has a lining to protect itself from the acid that digests food. The lining can be damaged in two main ways:  The Helico Pylori bacteria (H. Pyolori) can infect the lining of the stomach and cause ulcers.   Nonsteroidal, anti-inflammatory medications (NSAIDS) can cause gastric ulcerations.   Smoking tobacco can increase the acid in the stomach. This can lead to ulcers, and will impair healing of ulcers.  Other factors, such as alcohol use and stress may contribute to ulcer formation.  SYMPTOMS The problems (symptoms) of ulcer disease are usually a burning or gnawing of the mid-upper belly (abdomen). This is often worse on an empty stomach and may get better with food. This may be associated with feeling sick to your stomach (nausea), bloating, and vomiting.  HOME CARE INSTRUCTIONS Continue regular work and usual activities unless advised otherwise by your caregiver.  Avoid tobacco, alcohol, and caffeine. Tobacco use will decrease and slow the rates of healing.   Avoid foods that seem to aggravate or cause discomfort.   Special diets are not usually needed.    Hiatal Hernia A hiatal hernia occurs when a part of the stomach slides above the diaphragm. The diaphragm is the thin muscle separating the belly (abdomen) from the chest. A hiatal hernia can be something you are born with or develop over time. Hiatal hernias may allow stomach acid to flow back into your esophagus, the tube which carries food from your mouth to your stomach. If this acid causes problems it is called GERD (gastro-esophageal reflux disease).   SYMPTOMS Common symptoms of GERD are heartburn (burning in your chest). This is worse when lying down or bending over. It may also cause belching and indigestion. Some of the things which make GERD worse are:  Increased weight pushes on stomach  making acid rise more easily.   Smoking markedly increases acid production.   Alcohol decreases lower esophageal sphincter pressure (valve between stomach and esophagus), allowing acid from stomach into esophagus.   Late evening meals and going to bed with a full stomach increases pressure.   Anything that causes an increase in acid production.    HOME CARE INSTRUCTIONS  Try to achieve and maintain an ideal body weight.   Avoid drinking alcoholic beverages.   DO NOT smokE.   Do not wear tight clothing around your chest or stomach.   Eat smaller meals and eat more frequently. This keeps your stomach from getting too full. Eat slowly.   Do not lie down for 2 or 3 hours after eating. Do not eat or drink anything 1 to 2 hours before going to bed.   Avoid caffeine beverages (colas, coffee, cocoa, tea), fatty foods, citrus fruits and all other foods and drinks that contain acid and that seem to increase the problems.   Avoid bending over, especially after eating OR STRAINING. Anything that  increases the pressure in your belly increases the amount of acid that may be pushed up into your esophagus.    High-Fiber Diet A high-fiber diet changes your normal diet to include more whole grains, legumes, fruits, and vegetables. Changes in the diet involve replacing refined carbohydrates with unrefined foods. The calorie level of the diet is essentially unchanged. The Dietary Reference Intake (recommended amount) for adult males is 38 grams per day. For adult females, it is 25 grams per day. Pregnant and lactating women should consume 28 grams of fiber per day. Fiber is the intact part of a plant that is not broken down during digestion. Functional fiber is fiber that has been isolated from the plant to provide a beneficial effect in the body. PURPOSE  Increase stool bulk.   Ease and regulate bowel movements.   Lower cholesterol.   REDUCE RISK OF COLON CANCER  INDICATIONS THAT YOU NEED  MORE FIBER  Constipation and hemorrhoids.   Uncomplicated diverticulosis (intestine condition) and irritable bowel syndrome.   Weight management.   As a protective measure against hardening of the arteries (atherosclerosis), diabetes, and cancer.   GUIDELINES FOR INCREASING FIBER IN THE DIET  Start adding fiber to the diet slowly. A gradual increase of about 5 more grams (2 slices of whole-wheat bread, 2 servings of most fruits or vegetables, or 1 bowl of high-fiber cereal) per day is best. Too rapid an increase in fiber may result in constipation, flatulence, and bloating.   Drink enough water and fluids to keep your urine clear or pale yellow. Water, juice, or caffeine-free drinks are recommended. Not drinking enough fluid may cause constipation.   Eat a variety of high-fiber foods rather than one type of fiber.   Try to increase your intake of fiber through using high-fiber foods rather than fiber pills or supplements that contain small amounts of fiber.   The goal is to change the types of food eaten. Do not supplement your present diet with high-fiber foods, but replace foods in your present diet.   INCLUDE A VARIETY OF FIBER SOURCES  Replace refined and processed grains with whole grains, canned fruits with fresh fruits, and incorporate other fiber sources. White rice, white breads, and most bakery goods contain little or no fiber.   Brown whole-grain rice, buckwheat oats, and many fruits and vegetables are all good sources of fiber. These include: broccoli, Brussels sprouts, cabbage, cauliflower, beets, sweet potatoes, white potatoes (skin on), carrots, tomatoes, eggplant, squash, berries, fresh fruits, and dried fruits.   Cereals appear to be the richest source of fiber. Cereal fiber is found in whole grains and bran. Bran is the fiber-rich outer coat of cereal grain, which is largely removed in refining. In whole-grain cereals, the bran remains. In breakfast cereals, the largest  amount of fiber is found in those with "bran" in their names. The fiber content is sometimes indicated on the label.   You may need to include additional fruits and vegetables each day.   In baking, for 1 cup white flour, you may use the following substitutions:   1 cup whole-wheat flour minus 2 tablespoons.   1/2 cup white flour plus 1/2 cup whole-wheat flour.    Hemorrhoids Hemorrhoids are dilated (enlarged) veins around the rectum. Sometimes clots will form in the veins. This makes them swollen and painful. These are called thrombosed hemorrhoids. Causes of hemorrhoids include:  Constipation.   Straining to have a bowel movement.   HEAVY LIFTING   HOME CARE INSTRUCTIONS  Eat a well balanced diet and drink 6 to 8 glasses of water every day to avoid constipation. You may also use a bulk laxative.   Avoid straining to have bowel movements.   Keep anal area dry and clean.   Do not use a donut shaped pillow or sit on the toilet for long periods. This increases blood pooling and pain.   Move your bowels when your body has the urge; this will require less straining and will decrease pain and pressure.

## 2015-11-01 NOTE — Op Note (Signed)
[facility] Patient Name: Anthony Little Procedure Date: 11/01/2015 1:41 PM MRN: [id number] Date of Birth: Apr 26, 1950 Attending MD: Barney Drain , MD CSN: [id number] Age: 65 Admit Type: Outpatient Procedure:                Colonoscopy WITH COLD FORCEPS POLYPECTOMY Indications:              High risk colon cancer surveillance: Personal                            history of colonic polyps Providers:                Barney Drain, MD, [name], RN, Purcell Nails.                            Tina Griffiths, Technician Referring MD:             Elayne Snare Luking Medicines:                Promethazine 25 mg IV, Midazolam 7 mg IV,                            Meperidine 100 mg IV, Atropine 0.5 mg IV Complications:            Vasovagal reaction, Scope trauma, Tear Estimated Blood Loss:     Estimated blood loss was minimal. Procedure:                Pre-Anesthesia Assessment:                           - Prior to the procedure, a History and Physical                            was performed, and patient medications and                            allergies were reviewed. The patient's tolerance of                            previous anesthesia was also reviewed. The risks                            and benefits of the procedure and the sedation                            options and risks were discussed with the patient.                            All questions were answered, and informed consent                            was obtained. Prior Anticoagulants: The patient has                            taken aspirin, last dose was day of procedure. ASA  Grade Assessment: II - A patient with mild systemic                            disease. After reviewing the risks and benefits,                            the patient was deemed in satisfactory condition to                            undergo the procedure.                           After obtaining informed consent, the colonoscope                             was passed under direct vision. Throughout the                            procedure, the patient's blood pressure, pulse, and                            oxygen saturations were monitored continuously. The                            [id number] scope was introduced through                            the anus and advanced to the the cecum, identified                            by appendiceal orifice and ileocecal valve. The                            ileocecal valve, appendiceal orifice, and rectum                            were photographed. The colonoscopy was technically                            difficult and complex due to restricted mobility of                            the colon. Successful completion of the procedure                            was aided by changing the patient to a supine                            position. The patient tolerated the procedure                            fairly well. The quality of the bowel preparation  was excellent. Scope In: 2:01:03 PM Scope Out: 2:23:48 PM Scope Withdrawal Time: 0 hours 12 minutes 22 seconds  Total Procedure Duration: 0 hours 22 minutes 45 seconds  Findings:      Four sessile polyps were found in the descending colon, transverse colon       and ascending colon(2). The polyps were 2 to 4 mm in size. These polyps       were removed with a cold biopsy forceps. Resection and retrieval were       complete.      A few small and large-mouthed diverticula were found in the sigmoid       colon and descending colon. There was narrowing of the colon in       association with the diverticular opening.      Non-bleeding internal hemorrhoids were found. The hemorrhoids were       moderate. Impression:               - Four 2 to 4 mm polyps in the descending colon, in                            the transverse colon and in the ascending colon,                            removed  with a cold biopsy forceps. Resected and                            retrieved.                           - Moderate diverticulosis in the sigmoid colon and                            in the descending colon. There was narrowing of the                            colon in association with the diverticular opening.                           - Non-bleeding internal hemorrhoids. Moderate Sedation:      Moderate (conscious) sedation was administered by the endoscopy nurse       and supervised by the endoscopist. The following parameters were       monitored: oxygen saturation, heart rate, blood pressure, and response       to care. Total physician intraservice time was 52 minutes. Recommendation:           - High fiber diet.                           - No aspirin, ibuprofen, naproxen, or other                            non-steroidal anti-inflammatory drugs for 7 days                            after biopsy. Prilosec bid for 3 mos then once  daily FOREVER.                           - Await pathology results.                           - Repeat colonoscopy in 3 - 5 years for                            surveillance.                           - Return to my office in 4 months.                           - Patient has a contact number available for                            emergencies. The signs and symptoms of potential                            delayed complications were discussed with the                            patient. Return to normal activities tomorrow.                            Written discharge instructions were provided to the                            patient. Procedure Code(s):        --- Professional ---                           [id number], Colonoscopy, flexible; with biopsy, single                            or multiple Diagnosis Code(s):        --- Professional ---                           Z86.010, Personal history of colonic polyps                            D12.4, Benign neoplasm of descending colon                           D12.3, Benign neoplasm of transverse colon (hepatic                            flexure or splenic flexure)                           D12.2, Benign neoplasm of ascending colon                           K64.8, Other hemorrhoids  K57.30, Diverticulosis of large intestine without                            perforation or abscess without bleeding CPT copyright 2016 American Medical Association. All rights reserved. The codes documented in this report are preliminary and upon coder review may  be revised to meet current compliance requirements. Barney Drain, MD Barney Drain, MD 11/01/2015 2:44:07 PM This report has been signed electronically. Number of Addenda: 0

## 2015-11-01 NOTE — OR Nursing (Signed)
Heart rate down to 32 during procedure atropine 0.5 given per Dr. Oneida Alar order

## 2015-11-01 NOTE — H&P (Signed)
  Primary Care Physician:  Sallee Lange, MD Primary Gastroenterologist:  Dr. Oneida Alar  Pre-Procedure History & Physical: HPI:  Anthony Little is a 65 y.o. male here for screening/DYSPEPSIA.  Past Medical History:  Diagnosis Date  . CVA (cerebral infarction)   . Essential hypertension   . Hyperlipidemia     Past Surgical History:  Procedure Laterality Date  . CARDIOVASCULAR STRESS TEST  2002   negative  . COLONOSCOPY  12/2009   Diverticulosis, mild internal hemorrhoids, multiple colon polyps removed (adenomatous). One irregular shaped polyp was tattooed. Next colonoscopy due October 2016.  Marland Kitchen ESOPHAGOGASTRODUODENOSCOPY N/A 10/08/2012   SLF: Moderate erosive gastritis/duodenitis. Biopsies benign.    Prior to Admission medications   Medication Sig Start Date End Date Taking? Authorizing Provider  mometasone (ELOCON) 0.1 % cream Apply to affected area bid prn 08/11/15 08/10/16 Yes Scott A Luking, MD  amLODipine (NORVASC) 5 MG tablet TAKE 1 TABLET (5 MG TOTAL) BY MOUTH DAILY. 08/17/15   Kathyrn Drown, MD  aspirin 81 MG tablet Take 81 mg by mouth daily.    Historical Provider, MD  atorvastatin (LIPITOR) 10 MG tablet Take 1 tablet (10 mg total) by mouth daily. 08/19/15   Kathyrn Drown, MD  Cyanocobalamin (VITAMIN B-12 CR PO) Take by mouth.    Historical Provider, MD    Allergies as of 10/21/2015  . (No Known Allergies)    Family History  Problem Relation Age of Onset  . Diabetes Mother   . Diabetes Maternal Grandfather   . Parkinson's disease Father   . Ulcers Neg Hx   . Colon cancer Neg Hx     Social History   Social History  . Marital status: Married    Spouse name: N/A  . Number of children: N/A  . Years of education: N/A   Occupational History  . [employer]    Social History Main Topics  . Smoking status: Current Every Day Smoker    Packs/day: 1.00    Types: Cigarettes    Start date: 10/04/2015  . Smokeless tobacco: Former Systems developer    Quit date: 01/07/2015   Comment: he would like to but he knows it is hard   . Alcohol use 0.0 oz/week     Comment: liquor 5 ounces per night, long-time  . Drug use: No  . Sexual activity: No   Other Topics Concern  . Not on file   Social History Narrative  . No narrative on file    Review of Systems: See HPI, otherwise negative ROS   Physical Exam: BP (!) 159/78   Pulse 73   Temp 99 F (37.2 C) (Oral)   Resp 12   Ht 5\' 10"  (1.778 m)   Wt 198 lb (89.8 kg)   SpO2 99%   BMI 28.41 kg/m  General:   Alert,  pleasant and cooperative in NAD Head:  Normocephalic and atraumatic. Neck:  Supple; Lungs:  Clear throughout to auscultation.    Heart:  Regular rate and rhythm. Abdomen:  Soft, nontender and nondistended. Normal bowel sounds, without guarding, and without rebound.   Neurologic:  Alert and  oriented x4;  grossly normal neurologically.  Impression/Plan:      screening/DYSPEPSIA  PLAN: EGD/TCS TODAY

## 2015-11-02 ENCOUNTER — Encounter (HOSPITAL_COMMUNITY): Payer: BLUE CROSS/BLUE SHIELD | Admitting: Physical Therapy

## 2015-11-04 ENCOUNTER — Encounter (HOSPITAL_COMMUNITY): Payer: BLUE CROSS/BLUE SHIELD | Admitting: Physical Therapy

## 2015-11-04 ENCOUNTER — Telehealth: Payer: Self-pay | Admitting: Gastroenterology

## 2015-11-04 NOTE — Telephone Encounter (Signed)
Please call pt. His stomach Bx shows gastritis & ULCERS DUE TO ASPIRIN.Marland Kitchen Please call pt. HE had TWO simple adenomas AND TWO BENIGN POLYPOID LESIONS removed.   HOLD ASPIRIN. RE-START AUG 28.  START OMEPRAZOLE TWICE DAILY.  AVOID ITEMS THAT TRIGGER GASTRITIS/ULCERS.  FOLLOW A HIGH FIBER.  AVOID ITEMS THAT CAUSE BLOATING.   FOLLOW UP IN 4 MOS.  NEXT COLONOSCOPY IN 5 YEARS.

## 2015-11-05 ENCOUNTER — Encounter (HOSPITAL_COMMUNITY): Payer: Self-pay | Admitting: Gastroenterology

## 2015-11-05 NOTE — Telephone Encounter (Signed)
Ov made and reminder in epic

## 2015-11-05 NOTE — Telephone Encounter (Signed)
PT is aware of results.  

## 2015-11-09 ENCOUNTER — Encounter (HOSPITAL_COMMUNITY): Payer: BLUE CROSS/BLUE SHIELD | Admitting: Physical Therapy

## 2015-11-11 ENCOUNTER — Encounter (HOSPITAL_COMMUNITY): Payer: BLUE CROSS/BLUE SHIELD | Admitting: Physical Therapy

## 2015-12-08 ENCOUNTER — Ambulatory Visit: Payer: BLUE CROSS/BLUE SHIELD | Admitting: Family Medicine

## 2015-12-10 ENCOUNTER — Other Ambulatory Visit: Payer: Self-pay

## 2015-12-10 MED ORDER — ATORVASTATIN CALCIUM 10 MG PO TABS: 10.0000 mg | ORAL_TABLET | Freq: Every day | ORAL | 0 refills | Status: DC

## 2015-12-27 ENCOUNTER — Other Ambulatory Visit: Payer: Self-pay | Admitting: Family Medicine

## 2015-12-28 ENCOUNTER — Encounter: Payer: Self-pay | Admitting: Family Medicine

## 2015-12-28 ENCOUNTER — Ambulatory Visit: Payer: BLUE CROSS/BLUE SHIELD | Admitting: Neurology

## 2016-01-04 ENCOUNTER — Ambulatory Visit: Payer: BLUE CROSS/BLUE SHIELD | Admitting: Family Medicine

## 2016-01-14 ENCOUNTER — Ambulatory Visit (INDEPENDENT_AMBULATORY_CARE_PROVIDER_SITE_OTHER): Payer: BLUE CROSS/BLUE SHIELD | Admitting: Family Medicine

## 2016-01-14 ENCOUNTER — Encounter: Payer: Self-pay | Admitting: Family Medicine

## 2016-01-14 VITALS — BP 136/82 | Ht 70.0 in | Wt 202.8 lb

## 2016-01-14 DIAGNOSIS — E782 Mixed hyperlipidemia: Secondary | ICD-10-CM | POA: Insufficient documentation

## 2016-01-14 DIAGNOSIS — I10 Essential (primary) hypertension: Secondary | ICD-10-CM | POA: Diagnosis not present

## 2016-01-14 DIAGNOSIS — Z79899 Other long term (current) drug therapy: Secondary | ICD-10-CM

## 2016-01-14 DIAGNOSIS — E7849 Other hyperlipidemia: Secondary | ICD-10-CM

## 2016-01-14 DIAGNOSIS — E785 Hyperlipidemia, unspecified: Secondary | ICD-10-CM | POA: Insufficient documentation

## 2016-01-14 DIAGNOSIS — E784 Other hyperlipidemia: Secondary | ICD-10-CM

## 2016-01-14 MED ORDER — VARENICLINE TARTRATE 1 MG PO TABS: 1.0000 mg | ORAL_TABLET | Freq: Two times a day (BID) | ORAL | 0 refills | Status: DC

## 2016-01-14 MED ORDER — VARENICLINE TARTRATE 0.5 MG X 11 & 1 MG X 42 PO MISC: ORAL | 0 refills | Status: DC

## 2016-01-14 NOTE — Progress Notes (Signed)
   Subjective:    Patient ID: Anthony Little, male    DOB: 1950-09-09, 65 y.o.   MRN: [id number]  HPI Patient arrives to discuss recent results and office visits with his specialist. This patient went through testing not only because of possibility of stroke related issues but also heart related issues heart tests came back looking good MRI of the head as well as vascular studies overall looked reasonably good patient has gone back to smoking is been counseled at length to quit smoking patient also under a lot of stress but denies being depressed patient feels his thinking is doing good. 25 minutes spent with patient greater than half in discussion of multiple different issues  Review of Systems  Constitutional: Negative for activity change, fatigue and fever.  Respiratory: Negative for cough and shortness of breath.   Cardiovascular: Negative for chest pain and leg swelling.  Neurological: Negative for headaches.       Objective:   Physical Exam  Constitutional: He appears well-nourished. No distress.  Cardiovascular: Normal rate, regular rhythm and normal heart sounds.   No murmur heard. Pulmonary/Chest: Effort normal and breath sounds normal. No respiratory distress.  Musculoskeletal: He exhibits no edema.  Lymphadenopathy:    He has no cervical adenopathy.  Neurological: He is alert.  Psychiatric: His behavior is normal.  Vitals reviewed.   Patient doesn't meant to having a few drinks of liquor per day. Patient was counseled to cut back on this and try to quit Patient was counseled to quit smoking Patient would like to use Chantix Side effects discussed in detail if he starts having depression or suicidal ideation with the medicine immediately stop he understands as he is used before and it helped      Assessment & Plan:  Significant family and work stress-we discussed different approaches it could be done to try to help this  Hypertension decent control continue  current measures  Smoking use Chantix as directed if it causes serious side effects as discussed above immediately stop the medicine follow-up  Hyperlipidemia lab work ordered. Patient continue medication.

## 2016-01-31 ENCOUNTER — Other Ambulatory Visit: Payer: Self-pay | Admitting: Family Medicine

## 2016-02-16 ENCOUNTER — Telehealth: Payer: Self-pay | Admitting: *Deleted

## 2016-02-16 DIAGNOSIS — R911 Solitary pulmonary nodule: Secondary | ICD-10-CM

## 2016-02-16 NOTE — Telephone Encounter (Signed)
Left message to see when patient can go for CT of the chest.

## 2016-02-16 NOTE — Telephone Encounter (Signed)
Patient is in [place] file for repeat CT chest without contrast for November-there is no diagnosis or reason for repeat and the original test is not in EPIC under imaging or media (ordered by specialist?) so unsure of diagnosis or reason for follow up  Please advise?

## 2016-02-16 NOTE — Telephone Encounter (Signed)
The reason for the testis pulmonary nodule which was seen on a abdominal CT scan in April which they recommended a follow-up CT in 6-12 months of the lungs. Therefore CT scan of the chest reason pulmonary nodule thank you

## 2016-02-17 ENCOUNTER — Telehealth: Payer: Self-pay | Admitting: Family Medicine

## 2016-02-17 NOTE — Addendum Note (Signed)
Addended by: Jesusita Oka on: 02/17/2016 11:44 AM   Modules accepted: Orders

## 2016-02-17 NOTE — Telephone Encounter (Signed)
Notified patient test scheduled for 02/23/16 at 6 pm. Patient was notified.

## 2016-02-17 NOTE — Telephone Encounter (Signed)
Received carrier Authorization from [facility]  for Patient's CT chest w/o contrast scheduled for 02/23/2016. Order ID: [id number], Valid through 02/17/2016-03/17/2016

## 2016-02-23 ENCOUNTER — Ambulatory Visit (HOSPITAL_COMMUNITY): Payer: BLUE CROSS/BLUE SHIELD

## 2016-02-28 ENCOUNTER — Ambulatory Visit: Payer: BLUE CROSS/BLUE SHIELD | Admitting: Gastroenterology

## 2016-03-03 ENCOUNTER — Ambulatory Visit (HOSPITAL_COMMUNITY)
Admission: RE | Admit: 2016-03-03 | Discharge: 2016-03-03 | Disposition: A | Discharge status: Home / Self Care | Payer: BLUE CROSS/BLUE SHIELD | Source: Ambulatory Visit | Attending: Family Medicine | Admitting: Family Medicine

## 2016-03-03 DIAGNOSIS — I2510 Atherosclerotic heart disease of native coronary artery without angina pectoris: Secondary | ICD-10-CM | POA: Insufficient documentation

## 2016-03-03 DIAGNOSIS — J439 Emphysema, unspecified: Secondary | ICD-10-CM | POA: Diagnosis not present

## 2016-03-03 DIAGNOSIS — I700 Atherosclerosis of aorta: Secondary | ICD-10-CM | POA: Insufficient documentation

## 2016-03-03 DIAGNOSIS — R911 Solitary pulmonary nodule: Secondary | ICD-10-CM | POA: Diagnosis present

## 2016-03-07 ENCOUNTER — Encounter: Payer: Self-pay | Admitting: Family Medicine

## 2016-03-07 DIAGNOSIS — I700 Atherosclerosis of aorta: Secondary | ICD-10-CM | POA: Insufficient documentation

## 2016-03-28 ENCOUNTER — Telehealth: Payer: Self-pay

## 2016-03-28 ENCOUNTER — Other Ambulatory Visit: Payer: Self-pay | Admitting: Family Medicine

## 2016-03-28 NOTE — Telephone Encounter (Signed)
Chantix Starting Month Pak was denied by insurance. Please see denial letter in folder in your office.

## 2016-04-02 NOTE — Telephone Encounter (Signed)
It is important to let the patient know that his insurance company denied covering Chantix in is recommending that he try NicoDerm CQ OTC patch first if this doesn't help him quit smoking then they stated that they will more than likely would cover Chantix. Therefore I recommend trying NicoDerm patches first if that does not help him quit over the course of the next 6-8 weeks then he can let us know that so we can record that in the medical record and re-issue a Chantix prescription and should be covered at that time

## 2016-04-03 NOTE — Telephone Encounter (Signed)
Left message to return call 

## 2016-04-03 NOTE — Telephone Encounter (Signed)
Discussed with pt. Pt verbalized understanding.  °

## 2016-04-24 ENCOUNTER — Other Ambulatory Visit: Payer: Self-pay | Admitting: Family Medicine

## 2016-06-29 ENCOUNTER — Other Ambulatory Visit: Payer: Self-pay | Admitting: Family Medicine

## 2016-09-26 ENCOUNTER — Other Ambulatory Visit: Payer: Self-pay | Admitting: Family Medicine

## 2016-09-26 NOTE — Telephone Encounter (Signed)
Last seen 01/14/16

## 2016-10-15 ENCOUNTER — Other Ambulatory Visit: Payer: Self-pay | Admitting: Family Medicine

## 2016-10-16 NOTE — Telephone Encounter (Signed)
Patient may have 30 days needs office visit

## 2016-11-14 ENCOUNTER — Other Ambulatory Visit: Payer: Self-pay | Admitting: Family Medicine

## 2016-11-23 ENCOUNTER — Other Ambulatory Visit: Payer: Self-pay | Admitting: Family Medicine

## 2016-11-25 ENCOUNTER — Other Ambulatory Visit: Payer: Self-pay | Admitting: Gastroenterology

## 2016-11-25 ENCOUNTER — Other Ambulatory Visit: Payer: Self-pay | Admitting: Family Medicine

## 2016-12-14 ENCOUNTER — Encounter: Payer: Self-pay | Admitting: Family Medicine

## 2016-12-14 ENCOUNTER — Ambulatory Visit (INDEPENDENT_AMBULATORY_CARE_PROVIDER_SITE_OTHER): Payer: 59 | Admitting: Family Medicine

## 2016-12-14 VITALS — BP 112/82 | Temp 98.6°F | Ht 70.0 in | Wt 195.6 lb

## 2016-12-14 DIAGNOSIS — L989 Disorder of the skin and subcutaneous tissue, unspecified: Secondary | ICD-10-CM

## 2016-12-14 DIAGNOSIS — L03211 Cellulitis of face: Secondary | ICD-10-CM | POA: Diagnosis not present

## 2016-12-14 MED ORDER — DOXYCYCLINE HYCLATE 100 MG PO TABS: 100.0000 mg | ORAL_TABLET | Freq: Two times a day (BID) | ORAL | 0 refills | Status: DC

## 2016-12-14 NOTE — Patient Instructions (Signed)
We will be setting you up for specialist with the dermatologist

## 2016-12-14 NOTE — Progress Notes (Signed)
   Subjective:    Patient ID: Anthony Little, male    DOB: [birth year], 66 y.o.   MRN: [id number]  HPI Patient arrives with c/o left eye pain and swelling for a few days. Patient has a area over the left eye and is causing some eyebrow and eyelid pain and discomfort and swelling over the past few days not having any visual issues. Did break a blood vessel in his left sclera Denies fever chills sweats States he had a skin lesion that was present for the past couple months that then recently has become tender and draining some  Review of Systems Please see above denies fever chills headache neck pain shortness of breath    Objective:   Physical Exam He does have some cellulitis above the left eyebrow with a skin lesion that appears to have a little bit of infection draining from it after cleansing the pus away he does have a small ulcerated area present of picture was taken of this and shown to the patient. A picture was also taken with his phone that he can show the dermatologist. It does have inflamed edges which raises the possibility of a underlying skin cancer       Assessment & Plan:  Possible skin cancer referral to dermatology Cellulitis with localized abscess Pus was eliminated Antibiotics prescribed Warning signs were discussed patient follow-up if problems

## 2016-12-19 ENCOUNTER — Other Ambulatory Visit: Payer: Self-pay | Admitting: Family Medicine

## 2017-01-10 ENCOUNTER — Other Ambulatory Visit: Payer: Self-pay | Admitting: Family Medicine

## 2017-01-10 ENCOUNTER — Telehealth: Payer: Self-pay | Admitting: Family Medicine

## 2017-01-10 NOTE — Telephone Encounter (Signed)
Review report of derma pathology in results folder.

## 2017-01-12 NOTE — Telephone Encounter (Signed)
I reviewed over the findings of his biopsy from dermatology.  According to the biopsy sheet they are referring him for skin surgery.  Please confirm and document with the patient that he is aware that dermatologist is setting him up for skin surgery.(I would assume he is aware but we need to verify and document thank you)

## 2017-01-12 NOTE — Telephone Encounter (Signed)
Yes Pt is aware. He states his surgery is set up in [place] for 02/05/2017

## 2017-01-12 NOTE — Telephone Encounter (Signed)
thanks

## 2017-01-12 NOTE — Telephone Encounter (Signed)
Please see note below. 

## 2017-01-22 ENCOUNTER — Telehealth: Payer: Self-pay

## 2017-01-22 MED ORDER — DOXYCYCLINE HYCLATE 100 MG PO TABS
100.0000 mg | ORAL_TABLET | Freq: Two times a day (BID) | ORAL | 0 refills | Status: DC
Start: 2017-01-22 — End: 2017-02-19

## 2017-01-22 NOTE — Telephone Encounter (Signed)
May have a refill on medication

## 2017-01-23 NOTE — Telephone Encounter (Signed)
Refills sent to pharmacy on 01/22/17

## 2017-01-24 ENCOUNTER — Other Ambulatory Visit: Payer: Self-pay | Admitting: Family Medicine

## 2017-01-24 NOTE — Telephone Encounter (Signed)
May have this +1 refill needs follow-up office visit 

## 2017-02-05 ENCOUNTER — Telehealth: Payer: Self-pay | Admitting: Family Medicine

## 2017-02-05 DIAGNOSIS — C44329 Squamous cell carcinoma of skin of other parts of face: Secondary | ICD-10-CM | POA: Diagnosis not present

## 2017-02-05 NOTE — Telephone Encounter (Signed)
Pt called to let Dr. Nicki Reaper know that the procedure that he recently had went well.

## 2017-02-05 NOTE — Telephone Encounter (Signed)
Good I am glad to hear that

## 2017-02-19 ENCOUNTER — Other Ambulatory Visit: Payer: Self-pay | Admitting: Family Medicine

## 2017-03-04 ENCOUNTER — Other Ambulatory Visit: Payer: Self-pay | Admitting: Family Medicine

## 2017-03-28 ENCOUNTER — Encounter: Payer: Self-pay | Admitting: Family Medicine

## 2017-03-28 ENCOUNTER — Ambulatory Visit (INDEPENDENT_AMBULATORY_CARE_PROVIDER_SITE_OTHER): Payer: Medicare Other | Admitting: Family Medicine

## 2017-03-28 VITALS — BP 148/92 | Temp 98.2°F | Ht 70.0 in | Wt 196.0 lb

## 2017-03-28 DIAGNOSIS — Z79899 Other long term (current) drug therapy: Secondary | ICD-10-CM

## 2017-03-28 DIAGNOSIS — I10 Essential (primary) hypertension: Secondary | ICD-10-CM

## 2017-03-28 DIAGNOSIS — H00015 Hordeolum externum left lower eyelid: Secondary | ICD-10-CM

## 2017-03-28 DIAGNOSIS — E7849 Other hyperlipidemia: Secondary | ICD-10-CM | POA: Diagnosis not present

## 2017-03-28 DIAGNOSIS — Z125 Encounter for screening for malignant neoplasm of prostate: Secondary | ICD-10-CM | POA: Diagnosis not present

## 2017-03-28 MED ORDER — CEPHALEXIN 500 MG PO CAPS: 500.0000 mg | ORAL_CAPSULE | Freq: Four times a day (QID) | ORAL | 0 refills | Status: DC

## 2017-03-28 MED ORDER — VARENICLINE TARTRATE 1 MG PO TABS: 1.0000 mg | ORAL_TABLET | Freq: Two times a day (BID) | ORAL | 0 refills | Status: DC

## 2017-03-28 MED ORDER — VARENICLINE TARTRATE 0.5 MG X 11 & 1 MG X 42 PO MISC: ORAL | 0 refills | Status: DC

## 2017-03-28 NOTE — Progress Notes (Signed)
   Subjective:    Patient ID: Anthony Little, male    DOB: Apr 07, 1950, 67 y.o.   MRN: [id number]  HPIpainful lump under left eye. Came up this past weekend.  Patient relates the area on her left eye became tender painful over the weekend seemingly progressive comes in today to have it seen  He had a lesion on the side of his face which was cancer was seen by dermatology they did not do surgery  Patient recently laid off from his job he is now in retirement   Would like to get chantix. Stopped smoking for about one year then started back.   Review of Systems Denies chest tightness pressure pain shortness of breath nausea vomiting diarrhea    Objective:   Physical Exam  Lungs are clear heart regular HEENT benign underneath the left eye is a stye      Assessment & Plan:  Stye-antibiotics prescribed warning signs discussed warm compresses follow-up if ongoing troubles  Patient advised to quit smoking he used Chantix before I this was prescribed to follow-up if ongoing troubles warning signs he is aware of if any depression issues immediately stop medication  Comprehensive lab work with a wellness checkup and review over his health problems and follow-up somewhere in the course of the next 4-6 weeks

## 2017-04-01 ENCOUNTER — Other Ambulatory Visit: Payer: Self-pay | Admitting: Family Medicine

## 2017-04-17 ENCOUNTER — Other Ambulatory Visit: Payer: Self-pay | Admitting: *Deleted

## 2017-04-17 DIAGNOSIS — R911 Solitary pulmonary nodule: Secondary | ICD-10-CM

## 2017-04-18 ENCOUNTER — Telehealth: Payer: Self-pay | Admitting: Family Medicine

## 2017-04-18 NOTE — Telephone Encounter (Signed)
[facility] - ?'s on scheduling CT of the chest

## 2017-04-20 ENCOUNTER — Telehealth: Payer: Self-pay | Admitting: Family Medicine

## 2017-04-20 NOTE — Telephone Encounter (Signed)
Rx prior auth submitted through CoverMyMeds  Rx prior auth APPROVED for pt's varenicline (CHANTIX STARTING MONTH PAK) 0.5 MG X 11 & 1 MG X 42 tablet  Valid 01/20/17 - 10/17/17 (through [place])  Faxed approval to [facility], sent to be scanned & filed

## 2017-04-23 DIAGNOSIS — Z79899 Other long term (current) drug therapy: Secondary | ICD-10-CM | POA: Diagnosis not present

## 2017-04-23 DIAGNOSIS — H00015 Hordeolum externum left lower eyelid: Secondary | ICD-10-CM | POA: Diagnosis not present

## 2017-04-23 DIAGNOSIS — E7849 Other hyperlipidemia: Secondary | ICD-10-CM | POA: Diagnosis not present

## 2017-04-23 DIAGNOSIS — Z125 Encounter for screening for malignant neoplasm of prostate: Secondary | ICD-10-CM | POA: Diagnosis not present

## 2017-04-23 DIAGNOSIS — I10 Essential (primary) hypertension: Secondary | ICD-10-CM | POA: Diagnosis not present

## 2017-04-23 NOTE — Telephone Encounter (Signed)
[facility]nd [facility] - ? Good day or time to schedule Chest CT

## 2017-04-24 LAB — LIPID PANEL
CHOL/HDL RATIO: 1.8 ratio (ref 0.0–5.0)
CHOLESTEROL TOTAL: 213 mg/dL — AB (ref 100–199)
HDL: 117 mg/dL (ref >39)
LDL Calculated: 86 mg/dL (ref 0–99)
TRIGLYCERIDES: 48 mg/dL (ref 0–149)
VLDL Cholesterol Cal: 10 mg/dL (ref 5–40)

## 2017-04-24 LAB — HEPATIC FUNCTION PANEL
ALT: 11 IU/L (ref 0–44)
AST: 18 IU/L (ref 0–40)
Albumin: 4.7 g/dL (ref 3.6–4.8)
Alkaline Phosphatase: 64 IU/L (ref 39–117)
BILIRUBIN, DIRECT: 0.16 mg/dL (ref 0.00–0.40)
Bilirubin Total: 0.5 mg/dL (ref 0.0–1.2)
Total Protein: 7.2 g/dL (ref 6.0–8.5)

## 2017-04-24 LAB — CBC WITH DIFFERENTIAL/PLATELET
BASOS ABS: 0.1 10*3/uL (ref 0.0–0.2)
Basos: 1 %
EOS (ABSOLUTE): 0.1 10*3/uL (ref 0.0–0.4)
Eos: 2 %
HEMOGLOBIN: 14.2 g/dL (ref 13.0–17.7)
Hematocrit: 42.4 % (ref 37.5–51.0)
Immature Grans (Abs): 0 10*3/uL (ref 0.0–0.1)
Immature Granulocytes: 0 %
LYMPHS ABS: 1.4 10*3/uL (ref 0.7–3.1)
LYMPHS: 23 %
MCH: 34.5 pg — AB (ref 26.6–33.0)
MCHC: 33.5 g/dL (ref 31.5–35.7)
MCV: 103 fL — ABNORMAL HIGH (ref 79–97)
MONOCYTES: 8 %
Monocytes Absolute: 0.5 10*3/uL (ref 0.1–0.9)
Neutrophils Absolute: 4 10*3/uL (ref 1.4–7.0)
Neutrophils: 66 %
PLATELETS: 201 10*3/uL (ref 150–379)
RBC: 4.11 x10E6/uL — ABNORMAL LOW (ref 4.14–5.80)
RDW: 14.6 % (ref 12.3–15.4)
WBC: 6.1 10*3/uL (ref 3.4–10.8)

## 2017-04-24 LAB — BASIC METABOLIC PANEL
BUN / CREAT RATIO: 17 (ref 10–24)
BUN: 20 mg/dL (ref 8–27)
CHLORIDE: 104 mmol/L (ref 96–106)
CO2: 24 mmol/L (ref 20–29)
Calcium: 9.2 mg/dL (ref 8.6–10.2)
Creatinine, Ser: 1.2 mg/dL (ref 0.76–1.27)
GFR calc non Af Amer: 63 mL/min/{1.73_m2} (ref >59)
GFR, EST AFRICAN AMERICAN: 73 mL/min/{1.73_m2} (ref >59)
GLUCOSE: 89 mg/dL (ref 65–99)
POTASSIUM: 5.4 mmol/L — AB (ref 3.5–5.2)
SODIUM: 142 mmol/L (ref 134–144)

## 2017-04-24 LAB — PSA: PROSTATE SPECIFIC AG, SERUM: 2 ng/mL (ref 0.0–4.0)

## 2017-04-30 ENCOUNTER — Encounter: Payer: Self-pay | Admitting: Family Medicine

## 2017-05-03 ENCOUNTER — Ambulatory Visit (INDEPENDENT_AMBULATORY_CARE_PROVIDER_SITE_OTHER): Payer: Medicare Other | Admitting: Family Medicine

## 2017-05-03 ENCOUNTER — Encounter: Payer: Self-pay | Admitting: Family Medicine

## 2017-05-03 VITALS — BP 138/86 | Ht 70.0 in | Wt 201.4 lb

## 2017-05-03 DIAGNOSIS — Z136 Encounter for screening for cardiovascular disorders: Secondary | ICD-10-CM | POA: Diagnosis not present

## 2017-05-03 DIAGNOSIS — Z23 Encounter for immunization: Secondary | ICD-10-CM | POA: Diagnosis not present

## 2017-05-03 DIAGNOSIS — R911 Solitary pulmonary nodule: Secondary | ICD-10-CM | POA: Diagnosis not present

## 2017-05-03 DIAGNOSIS — I10 Essential (primary) hypertension: Secondary | ICD-10-CM

## 2017-05-03 DIAGNOSIS — E7849 Other hyperlipidemia: Secondary | ICD-10-CM

## 2017-05-03 DIAGNOSIS — Z0001 Encounter for general adult medical examination with abnormal findings: Secondary | ICD-10-CM

## 2017-05-03 DIAGNOSIS — Z0000 Encounter for general adult medical examination without abnormal findings: Secondary | ICD-10-CM

## 2017-05-03 MED ORDER — ATORVASTATIN CALCIUM 10 MG PO TABS: 10.0000 mg | ORAL_TABLET | Freq: Every day | ORAL | 5 refills | Status: DC

## 2017-05-03 MED ORDER — BUPROPION HCL ER (SR) 150 MG PO TB12: 150.0000 mg | ORAL_TABLET | Freq: Two times a day (BID) | ORAL | 4 refills | Status: DC

## 2017-05-03 MED ORDER — ZOSTER VAC RECOMB ADJUVANTED 50 MCG/0.5ML IM SUSR: 0.5000 mL | Freq: Once | INTRAMUSCULAR | 0 refills | Status: AC

## 2017-05-03 NOTE — Progress Notes (Addendum)
Subjective:    Patient ID: Anthony Little, male    DOB: 09-03-1950, 67 y.o.   MRN: [id number]  HPI AWV- Annual Wellness Visit  The patient was seen for their annual wellness visit. The patient's past medical history, surgical history, and family history were reviewed. Pertinent vaccines were reviewed ( tetanus, pneumonia, shingles, flu) The patient's medication list was reviewed and updated.  The height and weight were entered.  BMI recorded in electronic record elsewhere  Cognitive screening was completed. Outcome of Mini - Cog: pass   Falls /depression screening electronically recorded within record elsewhere  Current tobacco usage:yes- smoker (All patients who use tobacco were given written and verbal information on quitting)  Recent listing of emergency department/hospitalizations over the past year were reviewed.  current specialist the patient sees on a regular basis: none   Medicare annual wellness visit patient questionnaire was reviewed.  A written screening schedule for the patient for the next 5-10 years was given. Appropriate discussion of followup regarding next visit was discussed.  Patient for blood pressure check up. Patient relates compliance with meds. Todays BP reviewed with the patient. Patient denies issues with medication. Patient relates reasonable diet. Patient tries to minimize salt. Patient aware of BP goals.  Patient here for follow-up regarding cholesterol.  Patient does try to maintain a reasonable diet.  Patient does take the medication on a regular basis.  Denies missing a dose.  The patient denies any obvious side effects.  Prior blood work results reviewed with the patient.  The patient is aware of his cholesterol goals and the need to keep it under good control to lessen the risk of disease.  Patient also a pulmonary nodule needs a follow-up on the CAT scan he has a scheduled is coming up in the near future he still smokes he knows he needs  to quit smoking  Patient with mild depression issues but not suicidal.  Denies any other underlying issue.  Relates he is just been feeling stressed out feeling somewhat down recently retired from his job he does drink a glass of wine at night but denies abusing alcohol     Review of Systems  Constitutional: Negative for activity change, appetite change and fever.  HENT: Negative for congestion and rhinorrhea.   Eyes: Negative for discharge.  Respiratory: Negative for cough and wheezing.   Cardiovascular: Negative for chest pain.  Gastrointestinal: Negative for abdominal pain, blood in stool and vomiting.  Genitourinary: Negative for difficulty urinating and frequency.  Musculoskeletal: Negative for neck pain.  Skin: Negative for rash.  Allergic/Immunologic: Negative for environmental allergies and food allergies.  Neurological: Negative for weakness and headaches.  Psychiatric/Behavioral: Negative for agitation.       Objective:   Physical Exam  Constitutional: He appears well-developed and well-nourished.  HENT:  Head: Normocephalic and atraumatic.  Right Ear: External ear normal.  Left Ear: External ear normal.  Nose: Nose normal.  Mouth/Throat: Oropharynx is clear and moist.  Eyes: Right eye exhibits no discharge. Left eye exhibits no discharge. No scleral icterus.  Neck: Normal range of motion. Neck supple. No thyromegaly present.  Cardiovascular: Normal rate, regular rhythm and normal heart sounds.  No murmur heard. Pulmonary/Chest: Effort normal and breath sounds normal. No respiratory distress. He has no wheezes.  Abdominal: Soft. Bowel sounds are normal. He exhibits no distension and no mass. There is no tenderness.  Genitourinary: Penis normal.  Musculoskeletal: Normal range of motion. He exhibits no edema.  Lymphadenopathy:  He has no cervical adenopathy.  Neurological: He is alert. He exhibits normal muscle tone. Coordination normal.  Skin: Skin is warm and  dry. No erythema.  Psychiatric: He has a normal mood and affect. His behavior is normal. Judgment normal.          Assessment & Plan:  Adult wellness-complete.wellness physical was conducted today. Importance of diet and exercise were discussed in detail. In addition to this a discussion regarding safety was also covered. We also reviewed over immunizations and gave recommendations regarding current immunization needed for age. In addition to this additional areas were also touched on including: Preventative health exams needed: Colonoscopy 2022  Patient was advised yearly wellness exam  Prevnar 13 will be given today Shingrix was advised Patient counseled to quit smoking Depression mild recommend Wellbutrin SR 150 mg 1 twice daily  Pulmonary nodule patient does need CT scan of the chest  Patient is a smoker and does need abdominal aortic ultrasound  Blood pressure addressed today  Patient with a history of thoracic aorta sclerosis he is on low-dose atorvastatin cholesterol profile looks good  This EKG was compared to the most recent previous EKG available in the electronic records.  (Please see previous EKGs most recent EKG under the EKG heading.) This EKG was compared to the one from 2017 no acute changes part of welcome to Medicare  Patient states he cannot afford Chantix he will hold off on this

## 2017-05-03 NOTE — Patient Instructions (Signed)
DASH Eating Plan DASH stands for "Dietary Approaches to Stop Hypertension." The DASH eating plan is a healthy eating plan that has been shown to reduce high blood pressure (hypertension). It may also reduce your risk for type 2 diabetes, heart disease, and stroke. The DASH eating plan may also help with weight loss. What are tips for following this plan? General guidelines  Avoid eating more than 2,300 mg (milligrams) of salt (sodium) a day. If you have hypertension, you may need to reduce your sodium intake to 1,500 mg a day.  Limit alcohol intake to no more than 1 drink a day for nonpregnant women and 2 drinks a day for men. One drink equals 12 oz of beer, 5 oz of wine, or 1 oz of hard liquor.  Work with your health care provider to maintain a healthy body weight or to lose weight. Ask what an ideal weight is for you.  Get at least 30 minutes of exercise that causes your heart to beat faster (aerobic exercise) most days of the week. Activities may include walking, swimming, or biking.  Work with your health care provider or diet and nutrition specialist (dietitian) to adjust your eating plan to your individual calorie needs. Reading food labels  Check food labels for the amount of sodium per serving. Choose foods with less than 5 percent of the Daily Value of sodium. Generally, foods with less than 300 mg of sodium per serving fit into this eating plan.  To find whole grains, look for the word "whole" as the first word in the ingredient list. Shopping  Buy products labeled as "low-sodium" or "no salt added."  Buy fresh foods. Avoid canned foods and premade or frozen meals. Cooking  Avoid adding salt when cooking. Use salt-free seasonings or herbs instead of table salt or sea salt. Check with your health care provider or pharmacist before using salt substitutes.  Do not fry foods. Cook foods using healthy methods such as baking, boiling, grilling, and broiling instead.  Cook with  heart-healthy oils, such as olive, canola, soybean, or sunflower oil. Meal planning   Eat a balanced diet that includes: ? 5 or more servings of fruits and vegetables each day. At each meal, try to fill half of your plate with fruits and vegetables. ? Up to 6-8 servings of whole grains each day. ? Less than 6 oz of lean meat, poultry, or fish each day. A 3-oz serving of meat is about the same size as a deck of cards. One egg equals 1 oz. ? 2 servings of low-fat dairy each day. ? A serving of nuts, seeds, or beans 5 times each week. ? Heart-healthy fats. Healthy fats called Omega-3 fatty acids are found in foods such as flaxseeds and coldwater fish, like sardines, salmon, and mackerel.  Limit how much you eat of the following: ? Canned or prepackaged foods. ? Food that is high in trans fat, such as fried foods. ? Food that is high in saturated fat, such as fatty meat. ? Sweets, desserts, sugary drinks, and other foods with added sugar. ? Full-fat dairy products.  Do not salt foods before eating.  Try to eat at least 2 vegetarian meals each week.  Eat more home-cooked food and less restaurant, buffet, and fast food.  When eating at a restaurant, ask that your food be prepared with less salt or no salt, if possible. What foods are recommended? The items listed may not be a complete list. Talk with your dietitian about what   dietary choices are best for you. Grains Whole-grain or whole-wheat bread. Whole-grain or whole-wheat pasta. Brown rice. Oatmeal. Quinoa. Bulgur. Whole-grain and low-sodium cereals. Pita bread. Low-fat, low-sodium crackers. Whole-wheat flour tortillas. Vegetables Fresh or frozen vegetables (raw, steamed, roasted, or grilled). Low-sodium or reduced-sodium tomato and vegetable juice. Low-sodium or reduced-sodium tomato sauce and tomato paste. Low-sodium or reduced-sodium canned vegetables. Fruits All fresh, dried, or frozen fruit. Canned fruit in natural juice (without  added sugar). Meat and other protein foods Skinless chicken or turkey. Ground chicken or turkey. Pork with fat trimmed off. Fish and seafood. Egg whites. Dried beans, peas, or lentils. Unsalted nuts, nut butters, and seeds. Unsalted canned beans. Lean cuts of beef with fat trimmed off. Low-sodium, lean deli meat. Dairy Low-fat (1%) or fat-free (skim) milk. Fat-free, low-fat, or reduced-fat cheeses. Nonfat, low-sodium ricotta or cottage cheese. Low-fat or nonfat yogurt. Low-fat, low-sodium cheese. Fats and oils Soft margarine without trans fats. Vegetable oil. Low-fat, reduced-fat, or light mayonnaise and salad dressings (reduced-sodium). Canola, safflower, olive, soybean, and sunflower oils. Avocado. Seasoning and other foods Herbs. Spices. Seasoning mixes without salt. Unsalted popcorn and pretzels. Fat-free sweets. What foods are not recommended? The items listed may not be a complete list. Talk with your dietitian about what dietary choices are best for you. Grains Baked goods made with fat, such as croissants, muffins, or some breads. Dry pasta or rice meal packs. Vegetables Creamed or fried vegetables. Vegetables in a cheese sauce. Regular canned vegetables (not low-sodium or reduced-sodium). Regular canned tomato sauce and paste (not low-sodium or reduced-sodium). Regular tomato and vegetable juice (not low-sodium or reduced-sodium). Pickles. Olives. Fruits Canned fruit in a light or heavy syrup. Fried fruit. Fruit in cream or butter sauce. Meat and other protein foods Fatty cuts of meat. Ribs. Fried meat. Bacon. Sausage. Bologna and other processed lunch meats. Salami. Fatback. Hotdogs. Bratwurst. Salted nuts and seeds. Canned beans with added salt. Canned or smoked fish. Whole eggs or egg yolks. Chicken or turkey with skin. Dairy Whole or 2% milk, cream, and half-and-half. Whole or full-fat cream cheese. Whole-fat or sweetened yogurt. Full-fat cheese. Nondairy creamers. Whipped toppings.  Processed cheese and cheese spreads. Fats and oils Butter. Stick margarine. Lard. Shortening. Ghee. Bacon fat. Tropical oils, such as coconut, palm kernel, or palm oil. Seasoning and other foods Salted popcorn and pretzels. Onion salt, garlic salt, seasoned salt, table salt, and sea salt. Worcestershire sauce. Tartar sauce. Barbecue sauce. Teriyaki sauce. Soy sauce, including reduced-sodium. Steak sauce. Canned and packaged gravies. Fish sauce. Oyster sauce. Cocktail sauce. Horseradish that you find on the shelf. Ketchup. Mustard. Meat flavorings and tenderizers. Bouillon cubes. Hot sauce and Tabasco sauce. Premade or packaged marinades. Premade or packaged taco seasonings. Relishes. Regular salad dressings. Where to find more information:  National Heart, Lung, and Blood Institute: www.nhlbi.nih.gov  American Heart Association: www.heart.org Summary  The DASH eating plan is a healthy eating plan that has been shown to reduce high blood pressure (hypertension). It may also reduce your risk for type 2 diabetes, heart disease, and stroke.  With the DASH eating plan, you should limit salt (sodium) intake to 2,300 mg a day. If you have hypertension, you may need to reduce your sodium intake to 1,500 mg a day.  When on the DASH eating plan, aim to eat more fresh fruits and vegetables, whole grains, lean proteins, low-fat dairy, and heart-healthy fats.  Work with your health care provider or diet and nutrition specialist (dietitian) to adjust your eating plan to your individual   calorie needs. This information is not intended to replace advice given to you by your health care provider. Make sure you discuss any questions you have with your health care provider. Document Released: 02/16/2011 Document Revised: 02/21/2016 Document Reviewed: 02/21/2016 Elsevier Interactive Patient Education  2018 Elsevier Inc.  

## 2017-05-04 NOTE — Telephone Encounter (Signed)
Scans scheduled while pt here, appt info given

## 2017-05-09 ENCOUNTER — Encounter: Payer: Self-pay | Admitting: Family Medicine

## 2017-05-21 ENCOUNTER — Ambulatory Visit (HOSPITAL_COMMUNITY)
Admission: RE | Admit: 2017-05-21 | Discharge: 2017-05-21 | Disposition: A | Discharge status: Home / Self Care | Payer: Medicare Other | Source: Ambulatory Visit | Attending: Family Medicine | Admitting: Family Medicine

## 2017-05-21 ENCOUNTER — Ambulatory Visit (HOSPITAL_COMMUNITY): Payer: BLUE CROSS/BLUE SHIELD

## 2017-05-21 DIAGNOSIS — I700 Atherosclerosis of aorta: Secondary | ICD-10-CM | POA: Insufficient documentation

## 2017-05-21 DIAGNOSIS — I77811 Abdominal aortic ectasia: Secondary | ICD-10-CM | POA: Diagnosis not present

## 2017-05-21 DIAGNOSIS — Z87891 Personal history of nicotine dependence: Secondary | ICD-10-CM | POA: Diagnosis not present

## 2017-05-21 DIAGNOSIS — Z0000 Encounter for general adult medical examination without abnormal findings: Secondary | ICD-10-CM | POA: Diagnosis not present

## 2017-05-21 DIAGNOSIS — Z136 Encounter for screening for cardiovascular disorders: Secondary | ICD-10-CM | POA: Diagnosis not present

## 2017-05-21 DIAGNOSIS — J439 Emphysema, unspecified: Secondary | ICD-10-CM | POA: Diagnosis not present

## 2017-05-21 DIAGNOSIS — R911 Solitary pulmonary nodule: Secondary | ICD-10-CM | POA: Diagnosis not present

## 2017-05-27 ENCOUNTER — Telehealth: Payer: Self-pay | Admitting: Family Medicine

## 2017-05-27 NOTE — Telephone Encounter (Signed)
Anthony Little-Patient had recent CT scan.  Showed esophageal area on the proximal esophagus.  Had endoscopy 2017.  Patient on PPI.  No dysphagia.  Based on CT findings do you recommend an esophagram or GI follow-up for endoscopy?  Thanks-Scott

## 2017-05-28 ENCOUNTER — Other Ambulatory Visit: Payer: Self-pay | Admitting: *Deleted

## 2017-05-28 DIAGNOSIS — K2289 Other specified disease of esophagus: Secondary | ICD-10-CM

## 2017-05-28 DIAGNOSIS — K228 Other specified diseases of esophagus: Secondary | ICD-10-CM

## 2017-05-28 NOTE — Telephone Encounter (Signed)
Discussed with pt. Pt verbalized understanding. Esophagus scheduled at aph on march 21st at [place] 8:45. NPO after midnight.

## 2017-05-28 NOTE — Telephone Encounter (Signed)
Nurses-please review with the patient.  The patient is aware of his CAT scan showing a pole small pulmonary nodule and he is aware about repeating the CAT scan in 12 months.  His CAT scan also showed a thickened area in the esophagus.  I have reviewed this with his gastroenterologist Dr. Oneida Alar.  It is felt most likely this is a area related to the CAT scan technique but they cannot be certain.  It is recommended to do a esophagram.  Please set this up.  Diagnosis thickened esophagus on CAT scan.

## 2017-05-28 NOTE — Telephone Encounter (Signed)
I PERSONALLY REVIEWED THE CT WITH RADIOLOGY (DR. CLARK). FINDINGS ARE MOST LIKELY DUE TO UNDER DISTENTION IN AN ASYMTPOMATIC PT. LAST EGD 2017 SHOWED NORMAL ESOPHAGUS. WOULD OBTAIN ESOPHAGRAM TO BE COMPLETE.

## 2017-05-28 NOTE — Progress Notes (Unsigned)
sophagram

## 2017-05-28 NOTE — Telephone Encounter (Signed)
Left message to return call 

## 2017-05-28 NOTE — Telephone Encounter (Signed)
Pt already in [place] file for march 2020 repeat ct scan for pulm nodule

## 2017-05-29 ENCOUNTER — Other Ambulatory Visit: Payer: Self-pay | Admitting: *Deleted

## 2017-05-29 MED ORDER — BUPROPION HCL ER (SR) 150 MG PO TB12: 150.0000 mg | ORAL_TABLET | Freq: Two times a day (BID) | ORAL | 1 refills | Status: DC

## 2017-05-31 ENCOUNTER — Ambulatory Visit (HOSPITAL_COMMUNITY)
Admission: RE | Admit: 2017-05-31 | Discharge: 2017-05-31 | Disposition: A | Discharge status: Home / Self Care | Payer: Medicare Other | Source: Ambulatory Visit | Attending: Family Medicine | Admitting: Family Medicine

## 2017-05-31 DIAGNOSIS — K228 Other specified diseases of esophagus: Secondary | ICD-10-CM | POA: Insufficient documentation

## 2017-05-31 DIAGNOSIS — I999 Unspecified disorder of circulatory system: Secondary | ICD-10-CM | POA: Diagnosis not present

## 2017-05-31 DIAGNOSIS — K2289 Other specified disease of esophagus: Secondary | ICD-10-CM

## 2017-05-31 DIAGNOSIS — K449 Diaphragmatic hernia without obstruction or gangrene: Secondary | ICD-10-CM | POA: Diagnosis not present

## 2017-06-01 ENCOUNTER — Other Ambulatory Visit: Payer: Self-pay | Admitting: Family Medicine

## 2017-06-01 DIAGNOSIS — I739 Peripheral vascular disease, unspecified: Principal | ICD-10-CM

## 2017-06-01 DIAGNOSIS — I779 Disorder of arteries and arterioles, unspecified: Secondary | ICD-10-CM

## 2017-06-12 ENCOUNTER — Other Ambulatory Visit: Payer: Self-pay | Admitting: *Deleted

## 2017-06-12 DIAGNOSIS — I779 Disorder of arteries and arterioles, unspecified: Secondary | ICD-10-CM

## 2017-06-12 DIAGNOSIS — I739 Peripheral vascular disease, unspecified: Principal | ICD-10-CM

## 2017-06-20 ENCOUNTER — Ambulatory Visit: Payer: Medicare Other | Admitting: Family Medicine

## 2017-06-20 ENCOUNTER — Ambulatory Visit (HOSPITAL_COMMUNITY)
Admission: RE | Admit: 2017-06-20 | Discharge: 2017-06-20 | Disposition: A | Discharge status: Home / Self Care | Payer: Medicare Other | Source: Ambulatory Visit | Attending: Family Medicine | Admitting: Family Medicine

## 2017-06-20 DIAGNOSIS — I6523 Occlusion and stenosis of bilateral carotid arteries: Secondary | ICD-10-CM | POA: Diagnosis not present

## 2017-06-20 DIAGNOSIS — I779 Disorder of arteries and arterioles, unspecified: Secondary | ICD-10-CM | POA: Diagnosis not present

## 2017-06-20 DIAGNOSIS — I6501 Occlusion and stenosis of right vertebral artery: Secondary | ICD-10-CM | POA: Diagnosis not present

## 2017-06-22 DIAGNOSIS — H4710 Unspecified papilledema: Secondary | ICD-10-CM | POA: Diagnosis not present

## 2017-06-22 DIAGNOSIS — H52223 Regular astigmatism, bilateral: Secondary | ICD-10-CM | POA: Diagnosis not present

## 2017-06-22 DIAGNOSIS — H2513 Age-related nuclear cataract, bilateral: Secondary | ICD-10-CM | POA: Diagnosis not present

## 2017-06-22 DIAGNOSIS — H5203 Hypermetropia, bilateral: Secondary | ICD-10-CM | POA: Diagnosis not present

## 2017-06-27 ENCOUNTER — Other Ambulatory Visit: Payer: Self-pay | Admitting: Nurse Practitioner

## 2017-06-28 ENCOUNTER — Ambulatory Visit (INDEPENDENT_AMBULATORY_CARE_PROVIDER_SITE_OTHER): Payer: Medicare Other | Admitting: Family Medicine

## 2017-06-28 ENCOUNTER — Encounter: Payer: Self-pay | Admitting: Family Medicine

## 2017-06-28 VITALS — BP 142/90 | Ht 70.0 in | Wt 199.0 lb

## 2017-06-28 DIAGNOSIS — I10 Essential (primary) hypertension: Secondary | ICD-10-CM | POA: Diagnosis not present

## 2017-06-28 DIAGNOSIS — I6523 Occlusion and stenosis of bilateral carotid arteries: Secondary | ICD-10-CM

## 2017-06-28 DIAGNOSIS — R911 Solitary pulmonary nodule: Secondary | ICD-10-CM

## 2017-06-28 NOTE — Patient Instructions (Signed)
DASH Eating Plan DASH stands for "Dietary Approaches to Stop Hypertension." The DASH eating plan is a healthy eating plan that has been shown to reduce high blood pressure (hypertension). It may also reduce your risk for type 2 diabetes, heart disease, and stroke. The DASH eating plan may also help with weight loss. What are tips for following this plan? General guidelines  Avoid eating more than 2,300 mg (milligrams) of salt (sodium) a day. If you have hypertension, you may need to reduce your sodium intake to 1,500 mg a day.  Limit alcohol intake to no more than 1 drink a day for nonpregnant women and 2 drinks a day for men. One drink equals 12 oz of beer, 5 oz of wine, or 1 oz of hard liquor.  Work with your health care provider to maintain a healthy body weight or to lose weight. Ask what an ideal weight is for you.  Get at least 30 minutes of exercise that causes your heart to beat faster (aerobic exercise) most days of the week. Activities may include walking, swimming, or biking.  Work with your health care provider or diet and nutrition specialist (dietitian) to adjust your eating plan to your individual calorie needs. Reading food labels  Check food labels for the amount of sodium per serving. Choose foods with less than 5 percent of the Daily Value of sodium. Generally, foods with less than 300 mg of sodium per serving fit into this eating plan.  To find whole grains, look for the word "whole" as the first word in the ingredient list. Shopping  Buy products labeled as "low-sodium" or "no salt added."  Buy fresh foods. Avoid canned foods and premade or frozen meals. Cooking  Avoid adding salt when cooking. Use salt-free seasonings or herbs instead of table salt or sea salt. Check with your health care provider or pharmacist before using salt substitutes.  Do not fry foods. Cook foods using healthy methods such as baking, boiling, grilling, and broiling instead.  Cook with  heart-healthy oils, such as olive, canola, soybean, or sunflower oil. Meal planning   Eat a balanced diet that includes: ? 5 or more servings of fruits and vegetables each day. At each meal, try to fill half of your plate with fruits and vegetables. ? Up to 6-8 servings of whole grains each day. ? Less than 6 oz of lean meat, poultry, or fish each day. A 3-oz serving of meat is about the same size as a deck of cards. One egg equals 1 oz. ? 2 servings of low-fat dairy each day. ? A serving of nuts, seeds, or beans 5 times each week. ? Heart-healthy fats. Healthy fats called Omega-3 fatty acids are found in foods such as flaxseeds and coldwater fish, like sardines, salmon, and mackerel.  Limit how much you eat of the following: ? Canned or prepackaged foods. ? Food that is high in trans fat, such as fried foods. ? Food that is high in saturated fat, such as fatty meat. ? Sweets, desserts, sugary drinks, and other foods with added sugar. ? Full-fat dairy products.  Do not salt foods before eating.  Try to eat at least 2 vegetarian meals each week.  Eat more home-cooked food and less restaurant, buffet, and fast food.  When eating at a restaurant, ask that your food be prepared with less salt or no salt, if possible. What foods are recommended? The items listed may not be a complete list. Talk with your dietitian about what   dietary choices are best for you. Grains Whole-grain or whole-wheat bread. Whole-grain or whole-wheat pasta. Brown rice. Oatmeal. Quinoa. Bulgur. Whole-grain and low-sodium cereals. Pita bread. Low-fat, low-sodium crackers. Whole-wheat flour tortillas. Vegetables Fresh or frozen vegetables (raw, steamed, roasted, or grilled). Low-sodium or reduced-sodium tomato and vegetable juice. Low-sodium or reduced-sodium tomato sauce and tomato paste. Low-sodium or reduced-sodium canned vegetables. Fruits All fresh, dried, or frozen fruit. Canned fruit in natural juice (without  added sugar). Meat and other protein foods Skinless chicken or turkey. Ground chicken or turkey. Pork with fat trimmed off. Fish and seafood. Egg whites. Dried beans, peas, or lentils. Unsalted nuts, nut butters, and seeds. Unsalted canned beans. Lean cuts of beef with fat trimmed off. Low-sodium, lean deli meat. Dairy Low-fat (1%) or fat-free (skim) milk. Fat-free, low-fat, or reduced-fat cheeses. Nonfat, low-sodium ricotta or cottage cheese. Low-fat or nonfat yogurt. Low-fat, low-sodium cheese. Fats and oils Soft margarine without trans fats. Vegetable oil. Low-fat, reduced-fat, or light mayonnaise and salad dressings (reduced-sodium). Canola, safflower, olive, soybean, and sunflower oils. Avocado. Seasoning and other foods Herbs. Spices. Seasoning mixes without salt. Unsalted popcorn and pretzels. Fat-free sweets. What foods are not recommended? The items listed may not be a complete list. Talk with your dietitian about what dietary choices are best for you. Grains Baked goods made with fat, such as croissants, muffins, or some breads. Dry pasta or rice meal packs. Vegetables Creamed or fried vegetables. Vegetables in a cheese sauce. Regular canned vegetables (not low-sodium or reduced-sodium). Regular canned tomato sauce and paste (not low-sodium or reduced-sodium). Regular tomato and vegetable juice (not low-sodium or reduced-sodium). Pickles. Olives. Fruits Canned fruit in a light or heavy syrup. Fried fruit. Fruit in cream or butter sauce. Meat and other protein foods Fatty cuts of meat. Ribs. Fried meat. Bacon. Sausage. Bologna and other processed lunch meats. Salami. Fatback. Hotdogs. Bratwurst. Salted nuts and seeds. Canned beans with added salt. Canned or smoked fish. Whole eggs or egg yolks. Chicken or turkey with skin. Dairy Whole or 2% milk, cream, and half-and-half. Whole or full-fat cream cheese. Whole-fat or sweetened yogurt. Full-fat cheese. Nondairy creamers. Whipped toppings.  Processed cheese and cheese spreads. Fats and oils Butter. Stick margarine. Lard. Shortening. Ghee. Bacon fat. Tropical oils, such as coconut, palm kernel, or palm oil. Seasoning and other foods Salted popcorn and pretzels. Onion salt, garlic salt, seasoned salt, table salt, and sea salt. Worcestershire sauce. Tartar sauce. Barbecue sauce. Teriyaki sauce. Soy sauce, including reduced-sodium. Steak sauce. Canned and packaged gravies. Fish sauce. Oyster sauce. Cocktail sauce. Horseradish that you find on the shelf. Ketchup. Mustard. Meat flavorings and tenderizers. Bouillon cubes. Hot sauce and Tabasco sauce. Premade or packaged marinades. Premade or packaged taco seasonings. Relishes. Regular salad dressings. Where to find more information:  National Heart, Lung, and Blood Institute: www.nhlbi.nih.gov  American Heart Association: www.heart.org Summary  The DASH eating plan is a healthy eating plan that has been shown to reduce high blood pressure (hypertension). It may also reduce your risk for type 2 diabetes, heart disease, and stroke.  With the DASH eating plan, you should limit salt (sodium) intake to 2,300 mg a day. If you have hypertension, you may need to reduce your sodium intake to 1,500 mg a day.  When on the DASH eating plan, aim to eat more fresh fruits and vegetables, whole grains, lean proteins, low-fat dairy, and heart-healthy fats.  Work with your health care provider or diet and nutrition specialist (dietitian) to adjust your eating plan to your individual   calorie needs. This information is not intended to replace advice given to you by your health care provider. Make sure you discuss any questions you have with your health care provider. Document Released: 02/16/2011 Document Revised: 02/21/2016 Document Reviewed: 02/21/2016 Elsevier Interactive Patient Education  2018 Elsevier Inc.  

## 2017-06-28 NOTE — Progress Notes (Signed)
   Subjective:    Patient ID: Anthony Little, male    DOB: 08-16-1950, 67 y.o.   MRN: [id number]  HPI Pt here for 6-8 week follow up. Pt had ultrasound done of Aorta on 06/20/2017. Patient had a CT scan of the chest Showed an area in the esophagus No pulmonary nodule enlargement Recommended follow-up CT in 1 year Esophagram was negative on the esophagus but showed carotid artery disease Had carotid ultrasound done which showed partial blockage in both carotid arteries but also showed probable blockage of the right vertebral artery   Review of Systems  Constitutional: Negative for activity change, fatigue and fever.  HENT: Negative for congestion and rhinorrhea.   Respiratory: Negative for cough and shortness of breath.   Cardiovascular: Negative for chest pain and leg swelling.  Gastrointestinal: Negative for abdominal pain, diarrhea and nausea.  Genitourinary: Negative for dysuria and hematuria.  Neurological: Negative for weakness and headaches.  Psychiatric/Behavioral: Negative for behavioral problems.       Objective:   Physical Exam  Constitutional: He appears well-nourished. No distress.  HENT:  Head: Normocephalic and atraumatic.  Eyes: Right eye exhibits no discharge. Left eye exhibits no discharge.  Neck: No tracheal deviation present.  Cardiovascular: Normal rate, regular rhythm and normal heart sounds.  No murmur heard. Pulmonary/Chest: Effort normal and breath sounds normal. No respiratory distress. He has no wheezes.  Musculoskeletal: He exhibits no edema.  Lymphadenopathy:    He has no cervical adenopathy.  Neurological: He is alert.  Skin: Skin is warm. No rash noted.  Psychiatric: His behavior is normal.  Vitals reviewed.         Assessment & Plan:  Carotid artery disease Best way to prevent progression is keep cholesterol under control keep active keep blood pressure under control and quit smoking patient has been counseled on all of these  patient will follow-up in 4 months  Upon further investigation-with the patient's risk factors including clinical evidence of carotid artery disease along with being a smoker and risk factors the patient would benefit from a 81 mg aspirin.  1 daily to prevent having a stroke.  At the same time continue omeprazole to help protect against gastritis because patient does have a history of gastritis.  If aspirin does bother him he will need to let us know and we will need to intervene accordingly

## 2017-07-02 ENCOUNTER — Encounter: Payer: Self-pay | Admitting: *Deleted

## 2017-07-02 NOTE — Progress Notes (Signed)
I called and left a message to r/c. 

## 2017-07-06 DIAGNOSIS — H43822 Vitreomacular adhesion, left eye: Secondary | ICD-10-CM | POA: Diagnosis not present

## 2017-07-06 DIAGNOSIS — Z01818 Encounter for other preprocedural examination: Secondary | ICD-10-CM | POA: Diagnosis not present

## 2017-07-06 DIAGNOSIS — H469 Unspecified optic neuritis: Secondary | ICD-10-CM | POA: Diagnosis not present

## 2017-07-06 DIAGNOSIS — H2511 Age-related nuclear cataract, right eye: Secondary | ICD-10-CM | POA: Diagnosis not present

## 2017-07-18 DIAGNOSIS — H2511 Age-related nuclear cataract, right eye: Secondary | ICD-10-CM | POA: Diagnosis not present

## 2017-07-19 DIAGNOSIS — H2511 Age-related nuclear cataract, right eye: Secondary | ICD-10-CM | POA: Diagnosis not present

## 2017-07-19 DIAGNOSIS — H25811 Combined forms of age-related cataract, right eye: Secondary | ICD-10-CM | POA: Diagnosis not present

## 2017-08-10 DIAGNOSIS — H2512 Age-related nuclear cataract, left eye: Secondary | ICD-10-CM | POA: Diagnosis not present

## 2017-08-10 DIAGNOSIS — H25812 Combined forms of age-related cataract, left eye: Secondary | ICD-10-CM | POA: Diagnosis not present

## 2017-08-26 ENCOUNTER — Other Ambulatory Visit: Payer: Self-pay | Admitting: Family Medicine

## 2017-10-20 ENCOUNTER — Other Ambulatory Visit: Payer: Self-pay | Admitting: Gastroenterology

## 2017-10-26 ENCOUNTER — Other Ambulatory Visit: Payer: Self-pay | Admitting: Family Medicine

## 2017-10-29 ENCOUNTER — Encounter: Payer: Self-pay | Admitting: Family Medicine

## 2017-10-29 ENCOUNTER — Ambulatory Visit (INDEPENDENT_AMBULATORY_CARE_PROVIDER_SITE_OTHER): Payer: Medicare Other | Admitting: Family Medicine

## 2017-10-29 VITALS — BP 152/84 | Ht 70.0 in | Wt 202.1 lb

## 2017-10-29 DIAGNOSIS — I10 Essential (primary) hypertension: Secondary | ICD-10-CM | POA: Diagnosis not present

## 2017-10-29 DIAGNOSIS — D692 Other nonthrombocytopenic purpura: Secondary | ICD-10-CM

## 2017-10-29 DIAGNOSIS — R140 Abdominal distension (gaseous): Secondary | ICD-10-CM | POA: Diagnosis not present

## 2017-10-29 DIAGNOSIS — D649 Anemia, unspecified: Secondary | ICD-10-CM

## 2017-10-29 DIAGNOSIS — Z79899 Other long term (current) drug therapy: Secondary | ICD-10-CM

## 2017-10-29 DIAGNOSIS — N289 Disorder of kidney and ureter, unspecified: Secondary | ICD-10-CM | POA: Diagnosis not present

## 2017-10-29 DIAGNOSIS — I6523 Occlusion and stenosis of bilateral carotid arteries: Secondary | ICD-10-CM

## 2017-10-29 MED ORDER — DICYCLOMINE HCL 10 MG PO CAPS: ORAL_CAPSULE | ORAL | 0 refills | Status: DC

## 2017-10-29 NOTE — Progress Notes (Signed)
   Subjective:    Patient ID: Anthony Little, male    DOB: 1950/07/03, 67 y.o.   MRN: [id number]  HPI Patient is here today to follow up on Chronic health issues. 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 He does not see any specialist.   He states he wants to discuss bloating issues.  He describes a lot of belching he also describes a lot of bloating feeling plus also mid back pain with eating especially at lunchtime does not happen as much at breakfast or dinner.   Review of Systems  Constitutional: Negative for activity change, appetite change and fatigue.  HENT: Negative for congestion and rhinorrhea.   Respiratory: Negative for cough and shortness of breath.   Cardiovascular: Negative for chest pain and leg swelling.  Gastrointestinal: Negative for abdominal pain, constipation, diarrhea, nausea and vomiting.  Neurological: Negative for dizziness and headaches.  Psychiatric/Behavioral: Negative for agitation and behavioral problems.       Objective:   Physical Exam  Constitutional: He appears well-nourished. No distress.  HENT:  Head: Normocephalic and atraumatic.  Eyes: Right eye exhibits no discharge. Left eye exhibits no discharge.  Neck: No tracheal deviation present.  Cardiovascular: Normal rate, regular rhythm and normal heart sounds.  No murmur heard. Pulmonary/Chest: Effort normal and breath sounds normal. No respiratory distress.  Musculoskeletal: He exhibits no edema.  Lymphadenopathy:    He has no cervical adenopathy.  Neurological: He is alert. Coordination normal.  Skin: Skin is warm and dry.  Psychiatric: He has a normal mood and affect. His behavior is normal.  Vitals reviewed.   Discomfort he is having does not occur with other meals does not wake him up in the middle the night he is not  losing weight as result Patient does have bruising on his arms probably related to his 81 mg aspirin and age Very nice patient    Assessment & Plan:  Bloating sensation with back pain with eating only happens with one meal out of the day Lab work ordered Also recommend Bentyl 10 mg before meal at lunch to see if this helps give [place] update in a couple weeks time if still having ongoing trouble referral to gastroenterology for further work-up Patient was told obviously that if he gets progressively worse or further troubles he needs to immediately let us know

## 2017-10-30 LAB — CBC WITH DIFFERENTIAL/PLATELET
BASOS ABS: 0.1 10*3/uL (ref 0.0–0.2)
Basos: 1 %
EOS (ABSOLUTE): 0.2 10*3/uL (ref 0.0–0.4)
Eos: 4 %
HEMATOCRIT: 39.2 % (ref 37.5–51.0)
Hemoglobin: 12.5 g/dL — ABNORMAL LOW (ref 13.0–17.7)
Immature Grans (Abs): 0 10*3/uL (ref 0.0–0.1)
Immature Granulocytes: 0 %
Lymphocytes Absolute: 1.4 10*3/uL (ref 0.7–3.1)
Lymphs: 26 %
MCH: 33.5 pg — ABNORMAL HIGH (ref 26.6–33.0)
MCHC: 31.9 g/dL (ref 31.5–35.7)
MCV: 105 fL — ABNORMAL HIGH (ref 79–97)
MONOS ABS: 0.4 10*3/uL (ref 0.1–0.9)
Monocytes: 7 %
Neutrophils Absolute: 3.4 10*3/uL (ref 1.4–7.0)
Neutrophils: 62 %
PLATELETS: 207 10*3/uL (ref 150–450)
RBC: 3.73 x10E6/uL — AB (ref 4.14–5.80)
RDW: 13.9 % (ref 12.3–15.4)
WBC: 5.5 10*3/uL (ref 3.4–10.8)

## 2017-10-30 LAB — HEPATIC FUNCTION PANEL
ALT: 17 IU/L (ref 0–44)
AST: 30 IU/L (ref 0–40)
Albumin: 4.4 g/dL (ref 3.6–4.8)
Alkaline Phosphatase: 64 IU/L (ref 39–117)
Bilirubin Total: 0.5 mg/dL (ref 0.0–1.2)
Bilirubin, Direct: 0.17 mg/dL (ref 0.00–0.40)
Total Protein: 6.9 g/dL (ref 6.0–8.5)

## 2017-10-30 LAB — BASIC METABOLIC PANEL
BUN / CREAT RATIO: 13 (ref 10–24)
BUN: 22 mg/dL (ref 8–27)
CO2: 24 mmol/L (ref 20–29)
CREATININE: 1.71 mg/dL — AB (ref 0.76–1.27)
Calcium: 9.1 mg/dL (ref 8.6–10.2)
Chloride: 105 mmol/L (ref 96–106)
GFR calc Af Amer: 47 mL/min/{1.73_m2} — ABNORMAL LOW (ref >59)
GFR, EST NON AFRICAN AMERICAN: 41 mL/min/{1.73_m2} — AB (ref >59)
Glucose: 101 mg/dL — ABNORMAL HIGH (ref 65–99)
POTASSIUM: 4.7 mmol/L (ref 3.5–5.2)
SODIUM: 142 mmol/L (ref 134–144)

## 2017-10-30 LAB — LIPASE: LIPASE: 40 U/L (ref 13–78)

## 2017-10-31 NOTE — Addendum Note (Signed)
Addended by: Dairl Ponder on: 10/31/2017 11:14 AM   Modules accepted: Orders

## 2017-11-24 ENCOUNTER — Other Ambulatory Visit: Payer: Self-pay | Admitting: Family Medicine

## 2017-12-12 ENCOUNTER — Encounter: Payer: Self-pay | Admitting: Family Medicine

## 2017-12-12 ENCOUNTER — Ambulatory Visit (INDEPENDENT_AMBULATORY_CARE_PROVIDER_SITE_OTHER): Payer: Medicare Other | Admitting: Family Medicine

## 2017-12-12 VITALS — BP 136/86 | Ht 70.0 in | Wt 204.0 lb

## 2017-12-12 DIAGNOSIS — I6523 Occlusion and stenosis of bilateral carotid arteries: Secondary | ICD-10-CM

## 2017-12-12 DIAGNOSIS — R1084 Generalized abdominal pain: Secondary | ICD-10-CM | POA: Diagnosis not present

## 2017-12-12 DIAGNOSIS — R1013 Epigastric pain: Secondary | ICD-10-CM

## 2017-12-12 DIAGNOSIS — R6881 Early satiety: Secondary | ICD-10-CM | POA: Diagnosis not present

## 2017-12-12 NOTE — Progress Notes (Signed)
   Subjective:    Patient ID: Anthony Little, male    DOB: [birth year], 67 y.o.   MRN: [id number]  HPI Pt here for nausea. Pt states that most times after lunch or dinner, about 15-20 minutes later he get a pain in lower back. Pt states he and his wife were out shopping and had a light lunch and when he got home, he had to sit down because he broke out in a sweat from the pain. Pt states this only happens around lunch. Pt states at last office visit provider instructed him to use dicyclomine 30-45 minutes before eating. Pt states this has not helped. The patient relates severe pain in his back after eating relates nausea and occasionally vomiting also epigastric pain he has had previous lab work which looked good this is been going on over the past 4 to 5 weeks He does have a history of smoking and alcohol use He also has a family history of pancreatic cancer Review of Systems  Constitutional: Negative for activity change.  HENT: Negative for congestion and rhinorrhea.   Respiratory: Negative for cough and shortness of breath.   Cardiovascular: Negative for chest pain.  Gastrointestinal: Positive for abdominal pain, nausea and vomiting. Negative for diarrhea.  Genitourinary: Negative for dysuria and hematuria.  Neurological: Negative for weakness and headaches.  Psychiatric/Behavioral: Negative for behavioral problems and confusion.       Objective:   Physical Exam  Constitutional: He appears well-nourished. No distress.  HENT:  Head: Normocephalic and atraumatic.  Eyes: Right eye exhibits no discharge. Left eye exhibits no discharge.  Neck: No tracheal deviation present.  Cardiovascular: Normal rate, regular rhythm and normal heart sounds.  No murmur heard. Pulmonary/Chest: Effort normal and breath sounds normal. No respiratory distress.  Musculoskeletal: He exhibits no edema.  Lymphadenopathy:    He has no cervical adenopathy.  Neurological: He is alert. Coordination normal.    Skin: Skin is warm and dry.  Psychiatric: He has a normal mood and affect. His behavior is normal.  Vitals reviewed.        25 minutes was spent with the patient.  This statement verifies that 25 minutes was indeed spent with the patient.  More than 50% of this visit-total duration of the visit-was spent in counseling and coordination of care. The issues that the patient came in for today as reflected in the diagnosis (s) please refer to documentation for further details.  Assessment & Plan:  Abdominal pains associated with eating along with back pain associated with eating Possible arterial claudication issues Lab work ordered Await the results Will discuss with GI May need CT scan May also need CTA  I believe the patient will need CT of the abdomen We will discuss with gastroenterology

## 2017-12-13 LAB — BASIC METABOLIC PANEL
BUN / CREAT RATIO: 18 (ref 10–24)
BUN: 27 mg/dL (ref 8–27)
CALCIUM: 8.9 mg/dL (ref 8.6–10.2)
CO2: 24 mmol/L (ref 20–29)
Chloride: 105 mmol/L (ref 96–106)
Creatinine, Ser: 1.53 mg/dL — ABNORMAL HIGH (ref 0.76–1.27)
GFR, EST AFRICAN AMERICAN: 54 mL/min/{1.73_m2} — AB (ref >59)
GFR, EST NON AFRICAN AMERICAN: 47 mL/min/{1.73_m2} — AB (ref >59)
Glucose: 102 mg/dL — ABNORMAL HIGH (ref 65–99)
Potassium: 4.9 mmol/L (ref 3.5–5.2)
SODIUM: 143 mmol/L (ref 134–144)

## 2017-12-13 LAB — CBC WITH DIFFERENTIAL/PLATELET
Basophils Absolute: 0.1 10*3/uL (ref 0.0–0.2)
Basos: 1 %
EOS (ABSOLUTE): 0.2 10*3/uL (ref 0.0–0.4)
EOS: 3 %
HEMATOCRIT: 38 % (ref 37.5–51.0)
HEMOGLOBIN: 12.9 g/dL — AB (ref 13.0–17.7)
IMMATURE GRANULOCYTES: 0 %
Immature Grans (Abs): 0 10*3/uL (ref 0.0–0.1)
Lymphocytes Absolute: 1.1 10*3/uL (ref 0.7–3.1)
Lymphs: 16 %
MCH: 34.4 pg — ABNORMAL HIGH (ref 26.6–33.0)
MCHC: 33.9 g/dL (ref 31.5–35.7)
MCV: 101 fL — ABNORMAL HIGH (ref 79–97)
MONOCYTES: 8 %
Monocytes Absolute: 0.5 10*3/uL (ref 0.1–0.9)
NEUTROS PCT: 72 %
Neutrophils Absolute: 4.7 10*3/uL (ref 1.4–7.0)
Platelets: 222 10*3/uL (ref 150–450)
RBC: 3.75 x10E6/uL — AB (ref 4.14–5.80)
RDW: 13 % (ref 12.3–15.4)
WBC: 6.7 10*3/uL (ref 3.4–10.8)

## 2017-12-13 LAB — HEPATIC FUNCTION PANEL
ALT: 15 IU/L (ref 0–44)
AST: 29 IU/L (ref 0–40)
Albumin: 4.1 g/dL (ref 3.6–4.8)
Alkaline Phosphatase: 69 IU/L (ref 39–117)
BILIRUBIN, DIRECT: 0.14 mg/dL (ref 0.00–0.40)
Bilirubin Total: 0.4 mg/dL (ref 0.0–1.2)
Total Protein: 6.7 g/dL (ref 6.0–8.5)

## 2017-12-13 LAB — LIPASE: LIPASE: 48 U/L (ref 13–78)

## 2017-12-13 NOTE — Progress Notes (Signed)
Dr.Scott Spoke with Magda Paganini.

## 2017-12-14 NOTE — Addendum Note (Signed)
Addended by: Karle Barr on: 12/14/2017 11:30 AM   Modules accepted: Orders

## 2018-01-02 ENCOUNTER — Ambulatory Visit (HOSPITAL_COMMUNITY): Admission: RE | Admit: 2018-01-02 | Payer: Medicare Other | Source: Ambulatory Visit

## 2018-01-23 ENCOUNTER — Ambulatory Visit (HOSPITAL_COMMUNITY): Payer: Medicare Other

## 2018-05-01 ENCOUNTER — Other Ambulatory Visit: Payer: Self-pay | Admitting: Family Medicine

## 2018-06-05 ENCOUNTER — Other Ambulatory Visit: Payer: Self-pay | Admitting: *Deleted

## 2018-06-05 DIAGNOSIS — I2510 Atherosclerotic heart disease of native coronary artery without angina pectoris: Secondary | ICD-10-CM

## 2018-06-05 DIAGNOSIS — R918 Other nonspecific abnormal finding of lung field: Secondary | ICD-10-CM

## 2018-06-05 DIAGNOSIS — R911 Solitary pulmonary nodule: Secondary | ICD-10-CM

## 2018-07-24 ENCOUNTER — Other Ambulatory Visit: Payer: Self-pay | Admitting: Family Medicine

## 2018-07-24 NOTE — Telephone Encounter (Signed)
Needs to schedule virtual visit May have refill

## 2018-07-25 ENCOUNTER — Ambulatory Visit (HOSPITAL_COMMUNITY): Payer: Medicare Other

## 2018-07-31 ENCOUNTER — Ambulatory Visit: Payer: Medicare Other | Admitting: Family Medicine

## 2018-07-31 ENCOUNTER — Other Ambulatory Visit: Payer: Self-pay

## 2018-07-31 DIAGNOSIS — I10 Essential (primary) hypertension: Secondary | ICD-10-CM

## 2018-07-31 NOTE — Progress Notes (Signed)
   Subjective:    Patient ID: GLENNIS MONTENEGRO, male    DOB: Jan 08, 1951, 68 y.o.   MRN: [id number]  Hypertension  This is a chronic problem. The current episode started more than 1 year ago. Risk factors for coronary artery disease include dyslipidemia and male gender. Treatments tried: norvasc. There are no compliance problems.    BP 162/78 but his machine runs higher than our numbers   Review of Systems   Virtual Visit via Video Note  I connected with Inocente C Rosenbach on 07/31/18 at  1:40 PM EDT by a video enabled telemedicine application and verified that I am speaking with the correct person using two identifiers.  Location: Patient: home Provider: office   I discussed the limitations of evaluation and management by telemedicine and the availability of in person appointments. The patient expressed understanding and agreed to proceed.  History of Present Illness:    Observations/Objective:   Assessment and Plan:   Follow Up Instructions:    I discussed the assessment and treatment plan with the patient. The patient was provided an opportunity to ask questions and all were answered. The patient agreed with the plan and demonstrated an understanding of the instructions.   The patient was advised to call back or seek an in-person evaluation if the symptoms worsen or if the condition fails to improve as anticipated. 15 I provided 15 minutes of non-face-to-face time during this encounter.         Objective:   Physical Exam        Assessment & Plan:  I tried reaching the patient multiple times for this virtual visit he was unable to connect with [place] we in fact finished this visit on 08/09/2018 there will be no charge for today's visit

## 2018-08-09 ENCOUNTER — Other Ambulatory Visit: Payer: Self-pay

## 2018-08-09 ENCOUNTER — Ambulatory Visit (INDEPENDENT_AMBULATORY_CARE_PROVIDER_SITE_OTHER): Payer: Medicare Other | Admitting: Family Medicine

## 2018-08-09 DIAGNOSIS — R911 Solitary pulmonary nodule: Secondary | ICD-10-CM

## 2018-08-09 DIAGNOSIS — I10 Essential (primary) hypertension: Secondary | ICD-10-CM

## 2018-08-09 DIAGNOSIS — I2510 Atherosclerotic heart disease of native coronary artery without angina pectoris: Secondary | ICD-10-CM

## 2018-08-09 DIAGNOSIS — Z125 Encounter for screening for malignant neoplasm of prostate: Secondary | ICD-10-CM | POA: Diagnosis not present

## 2018-08-09 DIAGNOSIS — L570 Actinic keratosis: Secondary | ICD-10-CM

## 2018-08-09 DIAGNOSIS — E7849 Other hyperlipidemia: Secondary | ICD-10-CM | POA: Diagnosis not present

## 2018-08-09 DIAGNOSIS — I700 Atherosclerosis of aorta: Secondary | ICD-10-CM

## 2018-08-09 MED ORDER — TRIAMCINOLONE ACETONIDE 0.1 % EX CREA: TOPICAL_CREAM | CUTANEOUS | 0 refills | Status: DC

## 2018-08-09 NOTE — Progress Notes (Deleted)
   Subjective:    Patient ID: Anthony Little, male    DOB: 23-Nov-1950, 68 y.o.   MRN: [id number]      Objective:   Physical Exam        Assessment & Plan:

## 2018-08-09 NOTE — Progress Notes (Signed)
   Subjective:    Patient ID: Anthony Little, male    DOB: [birth year], 68 y.o.   MRN: [id number]  Hypertension  This is a chronic problem. The current episode started more than 1 year ago. Pertinent negatives include no chest pain, headaches or shortness of breath. Risk factors for coronary artery disease include dyslipidemia. Treatments tried: norvasc. There are no compliance problems.    Patient is getting sores on forehead Patient relates multiple bumps sometimes scratches him they are bleed he had does have a history of skin cancer  Patient also has history of thoracic aorta atherosclerosis.  He does try to keep blood pressure and cholesterol under control takes his medicine on a regular basis  History hyperlipidemia previous labs reviewed new labs will be ordered later he will do the best he can continue his medication  Reflux issues decent control takes those currently  Denies being depressed. Review of Systems  Constitutional: Negative for activity change, fatigue and fever.  HENT: Negative for congestion and rhinorrhea.   Respiratory: Negative for cough and shortness of breath.   Cardiovascular: Negative for chest pain and leg swelling.  Gastrointestinal: Negative for abdominal pain, diarrhea and nausea.  Genitourinary: Negative for dysuria and hematuria.  Neurological: Negative for weakness and headaches.  Psychiatric/Behavioral: Negative for agitation and behavioral problems.       Objective:   Physical Exam Patient had virtual visit Appears to be in no distress Atraumatic Neuro able to relate and oriented No apparent resp distress Color normal  Patient has a history of pulmonary nodule does need a follow-up scan on that he does not want to do it currently because of coronavirus we will shoot for August      Assessment & Plan:  Blood pressure good control continue current measures watch diet closely continue medicines  Lab work indicated we will send in  paperwork  Actinic keratosis referral to dermatology Dr. Rozann Lesches  History of pulmonary nodule patient due for follow-up CAT scan he would like to put this off until later in the summer we will shoot for August a message was sent to the nurses to put her on the reminder for August  Thoracic aorta atherosclerosis we will work hard at keeping cholesterol under control continue current measures

## 2018-08-12 NOTE — Progress Notes (Signed)
Reminder placed.

## 2018-08-16 ENCOUNTER — Other Ambulatory Visit: Payer: Self-pay | Admitting: Family Medicine

## 2018-09-05 DIAGNOSIS — X32XXXD Exposure to sunlight, subsequent encounter: Secondary | ICD-10-CM | POA: Diagnosis not present

## 2018-09-05 DIAGNOSIS — L570 Actinic keratosis: Secondary | ICD-10-CM | POA: Diagnosis not present

## 2018-09-27 ENCOUNTER — Other Ambulatory Visit: Payer: Self-pay | Admitting: *Deleted

## 2018-09-27 DIAGNOSIS — I2510 Atherosclerotic heart disease of native coronary artery without angina pectoris: Secondary | ICD-10-CM

## 2018-10-14 ENCOUNTER — Other Ambulatory Visit (HOSPITAL_COMMUNITY): Payer: Medicare Other

## 2018-10-20 ENCOUNTER — Other Ambulatory Visit: Payer: Self-pay | Admitting: Family Medicine

## 2018-10-28 ENCOUNTER — Encounter: Payer: Self-pay | Admitting: Family Medicine

## 2018-10-28 ENCOUNTER — Other Ambulatory Visit: Payer: Self-pay

## 2018-10-28 ENCOUNTER — Ambulatory Visit (INDEPENDENT_AMBULATORY_CARE_PROVIDER_SITE_OTHER): Payer: Medicare Other | Admitting: Family Medicine

## 2018-10-28 VITALS — BP 140/90 | Temp 98.1°F | Wt 208.6 lb

## 2018-10-28 DIAGNOSIS — I2510 Atherosclerotic heart disease of native coronary artery without angina pectoris: Secondary | ICD-10-CM

## 2018-10-28 DIAGNOSIS — M79661 Pain in right lower leg: Secondary | ICD-10-CM | POA: Diagnosis not present

## 2018-10-28 DIAGNOSIS — M25561 Pain in right knee: Secondary | ICD-10-CM

## 2018-10-28 DIAGNOSIS — E7849 Other hyperlipidemia: Secondary | ICD-10-CM | POA: Diagnosis not present

## 2018-10-28 DIAGNOSIS — I10 Essential (primary) hypertension: Secondary | ICD-10-CM

## 2018-10-28 DIAGNOSIS — I700 Atherosclerosis of aorta: Secondary | ICD-10-CM | POA: Diagnosis not present

## 2018-10-28 MED ORDER — ATORVASTATIN CALCIUM 10 MG PO TABS: 10.0000 mg | ORAL_TABLET | Freq: Every day | ORAL | 1 refills | Status: DC

## 2018-10-28 MED ORDER — DICLOFENAC SODIUM 75 MG PO TBEC: 75.0000 mg | DELAYED_RELEASE_TABLET | Freq: Two times a day (BID) | ORAL | 0 refills | Status: DC

## 2018-10-28 MED ORDER — VARENICLINE TARTRATE 1 MG PO TABS: 1.0000 mg | ORAL_TABLET | Freq: Two times a day (BID) | ORAL | 1 refills | Status: DC

## 2018-10-28 MED ORDER — AMLODIPINE BESYLATE 5 MG PO TABS: 5.0000 mg | ORAL_TABLET | Freq: Every day | ORAL | 1 refills | Status: DC

## 2018-10-28 NOTE — Progress Notes (Signed)
Subjective:    Patient ID: Anthony Little, male    DOB: May 08, 1950, 68 y.o.   MRN: [id number]  Knee Pain  Incident onset: about a month ago. Injury mechanism: has been building a treehouse. The pain is present in the right knee. The pain has been worsening since onset. Associated symptoms comments: Pt states sometimes it feels like his knee is going to give way. Nothing aggravates the symptoms. He has tried NSAIDs for the symptoms. The treatment provided mild relief.  The patient was working on treehouse relates that the knee hurts with certain movements.  Also it is difficult for him to walk this been present over the past few weeks worse over the past week he was uncertain if his calf was more swollen than the other uncertain if the back of his knee was hurting him some denied any previous troubles with all of this  Patient for blood pressure check up.  The patient does have hypertension.  The patient is on medication.  Patient relates compliance with meds. Todays BP reviewed with the patient. Patient denies issues with medication. Patient relates reasonable diet. Patient tries to minimize salt. Patient aware of BP goals. Patient here for follow-up regarding cholesterol.  The patient does have hyperlipidemia.  Patient does try to maintain a reasonable diet.  Patient does take the medication on a regular basis.  Denies missing a dose.  The patient denies any obvious side effects.  Prior blood work results reviewed with the patient.  The patient is aware of his cholesterol goals and the need to keep it under good control to lessen the risk of disease.  The patient also has a history of thoracic aortic atherosclerosis very important to keep cholesterol under control medication to be continued I do recommend 81 mg aspirin for this patient  Patient is smoking he knows he needs to quit the lower his risk of heart attack and stroke  Review of Systems  Constitutional: Negative for activity change,  fatigue and fever.  HENT: Negative for congestion and rhinorrhea.   Respiratory: Negative for cough and shortness of breath.   Cardiovascular: Negative for chest pain and leg swelling.  Gastrointestinal: Negative for abdominal pain, diarrhea and nausea.  Genitourinary: Negative for dysuria and hematuria.  Musculoskeletal: Positive for arthralgias. Negative for back pain.  Neurological: Negative for weakness and headaches.  Psychiatric/Behavioral: Negative for agitation and behavioral problems.       Objective:   Physical Exam Vitals signs reviewed.  Cardiovascular:     Rate and Rhythm: Normal rate and regular rhythm.     Heart sounds: Normal heart sounds. No murmur.  Pulmonary:     Effort: Pulmonary effort is normal.     Breath sounds: Normal breath sounds.  Lymphadenopathy:     Cervical: No cervical adenopathy.  Neurological:     Mental Status: He is alert.  Psychiatric:        Behavior: Behavior normal.    Patient is due for comprehensive lab work this was ordered I also recommend doing a d-dimer I think the likelihood of a blood clot is low he does have some swelling in the knee joint with pain with certain movements but no calf tenderness no swelling noted in the calf       Assessment & Plan:  Knee pain- reported right calf pain-I believe this is more his knee giving him trouble I recommend anti-inflammatory twice a day and orthopedic referral  We will do a d-dimer if this is  positive he will need an ultrasound  HTN- Patient was seen today as part of a visit regarding hypertension. The importance of healthy diet and regular physical activity was discussed. The importance of compliance with medications discussed.  Ideal goal is to keep blood pressure low elevated levels certainly below 032/12 when possible.  The patient was counseled that keeping blood pressure under control lessen his risk of complications.  The importance of regular follow-ups was discussed with the  patient.  Low-salt diet such as DASH recommended.  Regular physical activity was recommended as well.  Patient was advised to keep regular follow-ups. He is doing well with his medication we will continue as is  Previous labs reviewed new labs ordered The patient was seen today as part of an evaluation regarding hyperlipidemia.  Recent lab work has been reviewed with the patient as well as the goals for good cholesterol care.  In addition to this medications have been discussed the importance of compliance with diet and medications discussed as well.  Finally the patient is aware that poor control of cholesterol, noncompliance can dramatically increase the risk of complications. The patient will keep regular office visits and the patient does agreed to periodic lab work.  Should also be noted that the patient is struggling with smoking we did talk about ways to quit he states he is currently taking Chantix and he is requesting additional prescriptions unless this was given  25 minutes was spent with the patient.  This statement verifies that 25 minutes was indeed spent with the patient.  More than 50% of this visit-total duration of the visit-was spent in counseling and coordination of care. The issues that the patient came in for today as reflected in the diagnosis (s) please refer to documentation for further details.  Referral to orthopedics for further evaluation in the knee he does have a mild effusion more than likely will benefit from an injection

## 2018-10-29 ENCOUNTER — Other Ambulatory Visit: Payer: Self-pay | Admitting: *Deleted

## 2018-10-29 ENCOUNTER — Other Ambulatory Visit: Payer: Self-pay | Admitting: Family Medicine

## 2018-10-29 ENCOUNTER — Ambulatory Visit (HOSPITAL_COMMUNITY)
Admission: RE | Admit: 2018-10-29 | Discharge: 2018-10-29 | Disposition: A | Discharge status: Home / Self Care | Payer: Medicare Other | Source: Ambulatory Visit | Attending: Family Medicine | Admitting: Family Medicine

## 2018-10-29 ENCOUNTER — Encounter: Payer: Self-pay | Admitting: Family Medicine

## 2018-10-29 DIAGNOSIS — M79661 Pain in right lower leg: Secondary | ICD-10-CM | POA: Diagnosis not present

## 2018-10-29 DIAGNOSIS — M79604 Pain in right leg: Secondary | ICD-10-CM

## 2018-10-29 DIAGNOSIS — M25561 Pain in right knee: Secondary | ICD-10-CM | POA: Diagnosis not present

## 2018-10-29 DIAGNOSIS — R7989 Other specified abnormal findings of blood chemistry: Secondary | ICD-10-CM

## 2018-10-29 DIAGNOSIS — R791 Abnormal coagulation profile: Secondary | ICD-10-CM | POA: Diagnosis not present

## 2018-10-29 LAB — HEPATIC FUNCTION PANEL
ALT: 10 IU/L (ref 0–44)
AST: 16 IU/L (ref 0–40)
Albumin: 4.7 g/dL (ref 3.8–4.8)
Alkaline Phosphatase: 70 IU/L (ref 39–117)
Bilirubin Total: 0.8 mg/dL (ref 0.0–1.2)
Bilirubin, Direct: 0.26 mg/dL (ref 0.00–0.40)
Total Protein: 7.2 g/dL (ref 6.0–8.5)

## 2018-10-29 LAB — LIPID PANEL
Chol/HDL Ratio: 2.3 ratio (ref 0.0–5.0)
Cholesterol, Total: 187 mg/dL (ref 100–199)
HDL: 83 mg/dL (ref >39)
LDL Calculated: 88 mg/dL (ref 0–99)
Triglycerides: 82 mg/dL (ref 0–149)
VLDL Cholesterol Cal: 16 mg/dL (ref 5–40)

## 2018-10-29 LAB — BASIC METABOLIC PANEL
BUN/Creatinine Ratio: 18 (ref 10–24)
BUN: 21 mg/dL (ref 8–27)
CO2: 23 mmol/L (ref 20–29)
Calcium: 10 mg/dL (ref 8.6–10.2)
Chloride: 100 mmol/L (ref 96–106)
Creatinine, Ser: 1.14 mg/dL (ref 0.76–1.27)
GFR calc Af Amer: 77 mL/min/{1.73_m2} (ref >59)
GFR calc non Af Amer: 66 mL/min/{1.73_m2} (ref >59)
Glucose: 91 mg/dL (ref 65–99)
Potassium: 5.9 mmol/L — ABNORMAL HIGH (ref 3.5–5.2)
Sodium: 138 mmol/L (ref 134–144)

## 2018-10-29 LAB — PSA: Prostate Specific Ag, Serum: 0.9 ng/mL (ref 0.0–4.0)

## 2018-10-29 LAB — D-DIMER, QUANTITATIVE: D-DIMER: 0.58 mg/L FEU — ABNORMAL HIGH (ref 0.00–0.49)

## 2018-10-30 ENCOUNTER — Other Ambulatory Visit: Payer: Self-pay | Admitting: *Deleted

## 2018-10-30 ENCOUNTER — Telehealth: Payer: Self-pay | Admitting: *Deleted

## 2018-10-30 DIAGNOSIS — M79604 Pain in right leg: Secondary | ICD-10-CM

## 2018-10-30 DIAGNOSIS — R7989 Other specified abnormal findings of blood chemistry: Secondary | ICD-10-CM

## 2018-10-30 NOTE — Telephone Encounter (Signed)
FYI _ Called pt to give him appt info for repeat u/s on Monday and noticed he was in our reminder file to do  CT SCAN OF CHEST DUE TO PULMONARY NODULE PER RADIOLOGIST RECOMMENDATION.  CAROTID ULTRASOUND DUE TO CAROTID ARTERY DISEASE  Asked pt when would be a good day to schedule because it was due 5 months ago. And he states due to covid he wanted to hold off on doing test and he would call us back to let us know when he was ready to schedule.

## 2018-10-31 ENCOUNTER — Encounter: Payer: Self-pay | Admitting: Family Medicine

## 2018-10-31 ENCOUNTER — Other Ambulatory Visit: Payer: Self-pay | Admitting: *Deleted

## 2018-10-31 DIAGNOSIS — E875 Hyperkalemia: Secondary | ICD-10-CM

## 2018-10-31 NOTE — Telephone Encounter (Signed)
Test has been scheduled twice and patient has not went for appointment and wants to hold for now Patient states he will let us know when he would like it rescheduled

## 2018-10-31 NOTE — Telephone Encounter (Signed)
Nurses please put this into the reminder file for April to reach back out to the patient regarding these tests

## 2018-10-31 NOTE — Telephone Encounter (Signed)
I understand his concern regarding COVID I would recommend the patient strongly consider doing the scan and the ultrasound of the neck later this fall but if he still defers for that then by you later than early spring  Please see what the patient would like to do Either way I definitely want this in the reminder file so that it does not get buried especially if he never commit this in the future to having this done  Nurses-so therefore if he states he will do it in the fall this needs to be scheduled for October If he states he puts this off until spring this needs to be done in March  Therefore utilize reminder file for either October or March based upon the above

## 2018-10-31 NOTE — Telephone Encounter (Signed)
Added to tickler file for 06/2019

## 2018-11-04 ENCOUNTER — Ambulatory Visit (HOSPITAL_COMMUNITY)
Admission: RE | Admit: 2018-11-04 | Discharge: 2018-11-04 | Disposition: A | Discharge status: Home / Self Care | Payer: Medicare Other | Source: Ambulatory Visit | Attending: Family Medicine | Admitting: Family Medicine

## 2018-11-04 ENCOUNTER — Other Ambulatory Visit: Payer: Self-pay

## 2018-11-04 DIAGNOSIS — R7989 Other specified abnormal findings of blood chemistry: Secondary | ICD-10-CM | POA: Diagnosis not present

## 2018-11-04 DIAGNOSIS — M79604 Pain in right leg: Secondary | ICD-10-CM | POA: Insufficient documentation

## 2018-11-04 DIAGNOSIS — I2510 Atherosclerotic heart disease of native coronary artery without angina pectoris: Secondary | ICD-10-CM | POA: Insufficient documentation

## 2018-11-04 DIAGNOSIS — I6523 Occlusion and stenosis of bilateral carotid arteries: Secondary | ICD-10-CM | POA: Diagnosis not present

## 2018-11-04 DIAGNOSIS — R600 Localized edema: Secondary | ICD-10-CM | POA: Diagnosis not present

## 2018-12-03 ENCOUNTER — Other Ambulatory Visit: Payer: Self-pay | Admitting: Family Medicine

## 2018-12-13 ENCOUNTER — Other Ambulatory Visit: Payer: Self-pay | Admitting: Family Medicine

## 2018-12-13 DIAGNOSIS — J0100 Acute maxillary sinusitis, unspecified: Secondary | ICD-10-CM | POA: Diagnosis not present

## 2018-12-13 DIAGNOSIS — J40 Bronchitis, not specified as acute or chronic: Secondary | ICD-10-CM | POA: Diagnosis not present

## 2018-12-13 DIAGNOSIS — R05 Cough: Secondary | ICD-10-CM | POA: Diagnosis not present

## 2018-12-13 DIAGNOSIS — R509 Fever, unspecified: Secondary | ICD-10-CM | POA: Diagnosis not present

## 2018-12-18 ENCOUNTER — Other Ambulatory Visit: Payer: Self-pay

## 2018-12-18 ENCOUNTER — Ambulatory Visit (INDEPENDENT_AMBULATORY_CARE_PROVIDER_SITE_OTHER): Payer: Medicare Other | Admitting: Family Medicine

## 2018-12-18 DIAGNOSIS — I2510 Atherosclerotic heart disease of native coronary artery without angina pectoris: Secondary | ICD-10-CM | POA: Diagnosis not present

## 2018-12-18 DIAGNOSIS — R0609 Other forms of dyspnea: Secondary | ICD-10-CM

## 2018-12-18 DIAGNOSIS — R0600 Dyspnea, unspecified: Secondary | ICD-10-CM

## 2018-12-18 DIAGNOSIS — J019 Acute sinusitis, unspecified: Secondary | ICD-10-CM | POA: Diagnosis not present

## 2018-12-18 MED ORDER — PREDNISONE 20 MG PO TABS: ORAL_TABLET | ORAL | 0 refills | Status: DC

## 2018-12-18 MED ORDER — CEFDINIR 300 MG PO CAPS: 300.0000 mg | ORAL_CAPSULE | Freq: Two times a day (BID) | ORAL | 0 refills | Status: DC

## 2018-12-18 MED ORDER — ALBUTEROL SULFATE HFA 108 (90 BASE) MCG/ACT IN AERS: 2.0000 | INHALATION_SPRAY | RESPIRATORY_TRACT | 6 refills | Status: DC | PRN

## 2018-12-18 NOTE — Progress Notes (Signed)
   Subjective:  Video failed  Patient ID: Anthony Little, male    DOB: 1951/02/24, 68 y.o.   MRN: [id number]  HPIhad cold all last week and went to urgent care last Friday and had some test run. Was told he had bronchitis. Prescribed prednisone and azithromycin. Pt has finished antibiotic and has one day left of prednisone. Has had sob for about 10 days.  Patient has had a cold symptoms for about 2 weeks head congestion drainage coughing bringing up some discolored phlegm patient with cold symptoms for the past couple weeks head congestion drainage coughing bringing up phlegm also having some shortness of breath.  States he went to urgent care at negative flu test negative COVID test he states that he is no longer coughing up discolored phlegm but he does relate having some shortness of breath with activity denies any other particular troubles currently.  Does have risk factors for COPD. Virtual Visit via Telephone Note  I connected with Anthony Little on 12/18/18 at  4:10 PM EDT by telephone and verified that I am speaking with the correct person using two identifiers.  Location: Patient: home Provider: office   I discussed the limitations, risks, security and privacy concerns of performing an evaluation and management service by telephone and the availability of in person appointments. I also discussed with the patient that there may be a patient responsible charge related to this service. The patient expressed understanding and agreed to proceed.   History of Present Illness:    Observations/Objective:   Assessment and Plan:   Follow Up Instructions:    I discussed the assessment and treatment plan with the patient. The patient was provided an opportunity to ask questions and all were answered. The patient agreed with the plan and demonstrated an understanding of the instructions.   The patient was advised to call back or seek an in-person evaluation if the symptoms worsen or  if the condition fails to improve as anticipated.  I provided 15 minutes of non-face-to-face time during this encounter.        Review of Systems  Constitutional: Negative for activity change, chills and fever.  HENT: Positive for congestion and rhinorrhea. Negative for ear pain.   Eyes: Negative for discharge.  Respiratory: Positive for cough and shortness of breath. Negative for wheezing.   Cardiovascular: Negative for chest pain.  Gastrointestinal: Negative for nausea and vomiting.  Musculoskeletal: Negative for arthralgias.       Objective:    Today's visit was via telephone Physical exam was not possible for this visit   He denies feeling short of breath sitting still but he does get short winded when he moves around    Assessment & Plan:  Probable bronchitis with possible underlying element of COPD he does not have an inhaler I recommend starting that 2 puffs every 2-4 hours as needed in addition to this I also recommend antibiotics and prednisone I told the patient over the next 48 hours if he is not improving next step would be ER evaluation with repeat COVID testing and chest x-ray.  Patient was told that should he get progressively short of breath he ought to go to ER right away

## 2018-12-20 ENCOUNTER — Encounter: Payer: Self-pay | Admitting: Family Medicine

## 2018-12-27 ENCOUNTER — Encounter: Payer: Self-pay | Admitting: Family Medicine

## 2018-12-27 DIAGNOSIS — R1011 Right upper quadrant pain: Secondary | ICD-10-CM

## 2018-12-27 DIAGNOSIS — G8929 Other chronic pain: Secondary | ICD-10-CM

## 2018-12-27 NOTE — Addendum Note (Signed)
Addended by: Vicente Males on: 12/27/2018 04:33 PM   Modules accepted: Orders

## 2018-12-27 NOTE — Telephone Encounter (Signed)
Nurses please assist with going ahead with referral for second opinion [facility] [place]  Also it may not be a bad idea for the patient to schedule a follow-up visit this coming week with me because he can take often a few weeks to get in for a second opinion

## 2018-12-30 ENCOUNTER — Telehealth: Payer: Self-pay | Admitting: Family Medicine

## 2018-12-30 NOTE — Telephone Encounter (Signed)
Please advise. Thank you

## 2018-12-30 NOTE — Telephone Encounter (Signed)
[facility] - need to schedule 

## 2018-12-30 NOTE — Telephone Encounter (Signed)
Pt sent this message in MyChart - please advise on where to put pt on the schedule, Dr. Nicki Reaper only has 8 available appointments this week and they are all same day slots  Please advise so we can touch base with the patient & schedule      Appointment Request From: Anthony Little  With Provider: Sallee Lange, MD [facility]]  Preferred Date Range: Any  Preferred Times: Any Time  Reason for visit: Office Visit  Comments: Per Dr. Wolfgang Phoenix, follow up visit to discuss continued pain in back and side after eating.  Also, just not feeling well and no energy following recent illness.

## 2018-12-30 NOTE — Telephone Encounter (Signed)
Set up whenever this week is fine Preferably not at the same time as a another patient's physical or another patient's complex visit

## 2018-12-31 ENCOUNTER — Encounter: Payer: Self-pay | Admitting: Family Medicine

## 2018-12-31 NOTE — Telephone Encounter (Signed)
appt sch'd - pt aware

## 2019-01-01 ENCOUNTER — Other Ambulatory Visit: Payer: Self-pay

## 2019-01-01 ENCOUNTER — Encounter: Payer: Self-pay | Admitting: Family Medicine

## 2019-01-01 ENCOUNTER — Ambulatory Visit (INDEPENDENT_AMBULATORY_CARE_PROVIDER_SITE_OTHER): Payer: Medicare Other | Admitting: Family Medicine

## 2019-01-01 VITALS — BP 150/90 | Temp 98.0°F | Wt 202.4 lb

## 2019-01-01 DIAGNOSIS — I2510 Atherosclerotic heart disease of native coronary artery without angina pectoris: Secondary | ICD-10-CM | POA: Diagnosis not present

## 2019-01-01 DIAGNOSIS — R110 Nausea: Secondary | ICD-10-CM

## 2019-01-01 DIAGNOSIS — K551 Chronic vascular disorders of intestine: Secondary | ICD-10-CM

## 2019-01-01 DIAGNOSIS — Z23 Encounter for immunization: Secondary | ICD-10-CM

## 2019-01-01 DIAGNOSIS — M545 Low back pain, unspecified: Secondary | ICD-10-CM

## 2019-01-01 NOTE — Progress Notes (Signed)
   Subjective:    Patient ID: Anthony Little, male    DOB: 12-01-1950, 68 y.o.   MRN: [id number]  HPI Pt states he has a bloated feeling a lot of the time. Pt presumes this is gas. Pt is also having low back pain after he eats; not every time. Pt has been eating healthy, cut back on red meat and some veggies that cause gas. (Pt states that he has also went to back to work) pt also states he gets nausea at times. Pt states he is passing gas more than being solid. Pt states that Gas-X has helped him. Pt states this started about 1 year and has become worse over the past 3-4 months.  No association with certain foods Energy ok pain happens 10 to 15 minutes after eating BM normal Pain last 15 minutes Patient still has his gallbladder.  Denies any upper abdominal pain. Review of Systems  Constitutional: Negative for activity change.  HENT: Negative for congestion and rhinorrhea.   Respiratory: Negative for cough and shortness of breath.   Cardiovascular: Negative for chest pain and leg swelling.  Gastrointestinal: Positive for abdominal pain. Negative for diarrhea, nausea and vomiting.  Genitourinary: Negative for dysuria and hematuria.  Neurological: Negative for weakness and headaches.  Psychiatric/Behavioral: Negative for behavioral problems and confusion.       Objective:   Physical Exam Vitals signs reviewed.  Constitutional:      General: He is not in acute distress. HENT:     Head: Normocephalic and atraumatic.  Eyes:     General:        Right eye: No discharge.        Left eye: No discharge.  Neck:     Trachea: No tracheal deviation.  Cardiovascular:     Rate and Rhythm: Normal rate and regular rhythm.     Heart sounds: Normal heart sounds. No murmur.  Pulmonary:     Effort: Pulmonary effort is normal. No respiratory distress.     Breath sounds: Normal breath sounds.  Lymphadenopathy:     Cervical: No cervical adenopathy.  Skin:    General: Skin is warm and dry.   Neurological:     Mental Status: He is alert.     Coordination: Coordination normal.  Psychiatric:        Behavior: Behavior normal.    Abdominal exam nontender no masses felt       Assessment & Plan:  Patient with significant postprandial low back pain and side pains which could be related to the possibility of vascular issues We will discuss with radiology most appropriate test  Patient is interested in getting second opinion regarding this issue.  Several years ago he saw gastroenterology locally but does not want to follow-up there. We will look toward getting him into gastroenterology in [place]  We will go order lab will first await these results.  25 minutes was spent with the patient.  This statement verifies that 25 minutes was indeed spent with the patient.  More than 50% of this visit-total duration of the visit-was spent in counseling and coordination of care. The issues that the patient came in for today as reflected in the diagnosis (s) please refer to documentation for further details.

## 2019-01-02 LAB — BASIC METABOLIC PANEL
BUN/Creatinine Ratio: 11 (ref 10–24)
BUN: 15 mg/dL (ref 8–27)
CO2: 22 mmol/L (ref 20–29)
Calcium: 9.4 mg/dL (ref 8.6–10.2)
Chloride: 104 mmol/L (ref 96–106)
Creatinine, Ser: 1.41 mg/dL — ABNORMAL HIGH (ref 0.76–1.27)
GFR calc Af Amer: 59 mL/min/{1.73_m2} — ABNORMAL LOW (ref >59)
GFR calc non Af Amer: 51 mL/min/{1.73_m2} — ABNORMAL LOW (ref >59)
Glucose: 85 mg/dL (ref 65–99)
Potassium: 4.8 mmol/L (ref 3.5–5.2)
Sodium: 141 mmol/L (ref 134–144)

## 2019-01-02 LAB — HEPATIC FUNCTION PANEL
ALT: 15 IU/L (ref 0–44)
AST: 19 IU/L (ref 0–40)
Albumin: 4.3 g/dL (ref 3.8–4.8)
Alkaline Phosphatase: 70 IU/L (ref 39–117)
Bilirubin Total: 0.7 mg/dL (ref 0.0–1.2)
Bilirubin, Direct: 0.27 mg/dL (ref 0.00–0.40)
Total Protein: 7.1 g/dL (ref 6.0–8.5)

## 2019-01-02 LAB — LIPASE: Lipase: 54 U/L (ref 13–78)

## 2019-01-02 LAB — CBC WITH DIFFERENTIAL/PLATELET
Basophils Absolute: 0.1 10*3/uL (ref 0.0–0.2)
Basos: 1 %
EOS (ABSOLUTE): 0.1 10*3/uL (ref 0.0–0.4)
Eos: 2 %
Hematocrit: 40 % (ref 37.5–51.0)
Hemoglobin: 13.8 g/dL (ref 13.0–17.7)
Immature Grans (Abs): 0 10*3/uL (ref 0.0–0.1)
Immature Granulocytes: 0 %
Lymphocytes Absolute: 1.3 10*3/uL (ref 0.7–3.1)
Lymphs: 17 %
MCH: 35.5 pg — ABNORMAL HIGH (ref 26.6–33.0)
MCHC: 34.5 g/dL (ref 31.5–35.7)
MCV: 103 fL — ABNORMAL HIGH (ref 79–97)
Monocytes Absolute: 0.5 10*3/uL (ref 0.1–0.9)
Monocytes: 7 %
Neutrophils Absolute: 5.5 10*3/uL (ref 1.4–7.0)
Neutrophils: 73 %
Platelets: 204 10*3/uL (ref 150–450)
RBC: 3.89 x10E6/uL — ABNORMAL LOW (ref 4.14–5.80)
RDW: 12.8 % (ref 11.6–15.4)
WBC: 7.5 10*3/uL (ref 3.4–10.8)

## 2019-01-02 LAB — AMYLASE: Amylase: 61 U/L (ref 31–110)

## 2019-01-24 NOTE — Addendum Note (Signed)
Addended by: Vicente Males on: 01/24/2019 03:02 PM   Modules accepted: Orders

## 2019-02-13 ENCOUNTER — Telehealth: Payer: Self-pay | Admitting: *Deleted

## 2019-02-13 ENCOUNTER — Telehealth: Payer: Self-pay | Admitting: Family Medicine

## 2019-02-13 DIAGNOSIS — I998 Other disorder of circulatory system: Secondary | ICD-10-CM

## 2019-02-13 NOTE — Telephone Encounter (Signed)
Per Dr. Thornton Papas -  CT Abdomen LTD needs to be CTA Abdomen & Pelvis  that is what we normally do for Mesenteric Ischemia.  No PA req'd - please advise

## 2019-02-13 NOTE — Telephone Encounter (Signed)
Test ordered

## 2019-02-13 NOTE — Telephone Encounter (Signed)
Anthony Little I also called over to radiology and had the test changed. Pt scheduled for same time tomorrow

## 2019-02-13 NOTE — Telephone Encounter (Signed)
yes

## 2019-02-13 NOTE — Telephone Encounter (Signed)
Can we change order. Test is in the morning

## 2019-02-14 ENCOUNTER — Ambulatory Visit (HOSPITAL_COMMUNITY): Admission: RE | Admit: 2019-02-14 | Payer: Medicare Other | Source: Ambulatory Visit

## 2019-02-14 NOTE — Telephone Encounter (Signed)
Ok thanks 

## 2019-03-05 ENCOUNTER — Encounter: Payer: Self-pay | Admitting: Family Medicine

## 2019-05-08 DIAGNOSIS — Z23 Encounter for immunization: Secondary | ICD-10-CM | POA: Diagnosis not present

## 2019-06-05 ENCOUNTER — Other Ambulatory Visit: Payer: Self-pay | Admitting: *Deleted

## 2019-06-05 ENCOUNTER — Encounter: Payer: Self-pay | Admitting: Family Medicine

## 2019-06-05 DIAGNOSIS — R911 Solitary pulmonary nodule: Secondary | ICD-10-CM

## 2019-06-06 ENCOUNTER — Other Ambulatory Visit: Payer: Self-pay | Admitting: Family Medicine

## 2019-06-06 DIAGNOSIS — Z23 Encounter for immunization: Secondary | ICD-10-CM | POA: Diagnosis not present

## 2019-06-06 NOTE — Telephone Encounter (Signed)
May have 90-day needs to do a follow-up visit

## 2019-06-09 NOTE — Telephone Encounter (Signed)
[facility] - need to schedule visit & forward back to the nurses

## 2019-06-10 NOTE — Telephone Encounter (Signed)
Scheduled 6/22

## 2019-07-09 ENCOUNTER — Other Ambulatory Visit: Payer: Self-pay

## 2019-07-09 ENCOUNTER — Ambulatory Visit (HOSPITAL_COMMUNITY)
Admission: RE | Admit: 2019-07-09 | Discharge: 2019-07-09 | Disposition: A | Discharge status: Home / Self Care | Payer: Medicare Other | Source: Ambulatory Visit | Attending: Family Medicine | Admitting: Family Medicine

## 2019-07-09 DIAGNOSIS — R911 Solitary pulmonary nodule: Secondary | ICD-10-CM | POA: Diagnosis not present

## 2019-07-09 DIAGNOSIS — R918 Other nonspecific abnormal finding of lung field: Secondary | ICD-10-CM | POA: Diagnosis not present

## 2019-07-14 ENCOUNTER — Other Ambulatory Visit: Payer: Self-pay

## 2019-07-14 ENCOUNTER — Encounter: Payer: Self-pay | Admitting: Family Medicine

## 2019-07-14 ENCOUNTER — Ambulatory Visit (INDEPENDENT_AMBULATORY_CARE_PROVIDER_SITE_OTHER): Payer: Medicare Other | Admitting: Family Medicine

## 2019-07-14 VITALS — BP 138/82 | Temp 98.2°F | Ht 70.0 in | Wt 197.0 lb

## 2019-07-14 DIAGNOSIS — Z125 Encounter for screening for malignant neoplasm of prostate: Secondary | ICD-10-CM

## 2019-07-14 DIAGNOSIS — R911 Solitary pulmonary nodule: Secondary | ICD-10-CM | POA: Insufficient documentation

## 2019-07-14 DIAGNOSIS — I700 Atherosclerosis of aorta: Secondary | ICD-10-CM

## 2019-07-14 DIAGNOSIS — I77810 Thoracic aortic ectasia: Secondary | ICD-10-CM | POA: Insufficient documentation

## 2019-07-14 DIAGNOSIS — I10 Essential (primary) hypertension: Secondary | ICD-10-CM | POA: Diagnosis not present

## 2019-07-14 DIAGNOSIS — I2510 Atherosclerotic heart disease of native coronary artery without angina pectoris: Secondary | ICD-10-CM | POA: Diagnosis not present

## 2019-07-14 DIAGNOSIS — E7849 Other hyperlipidemia: Secondary | ICD-10-CM

## 2019-07-14 DIAGNOSIS — I25119 Atherosclerotic heart disease of native coronary artery with unspecified angina pectoris: Secondary | ICD-10-CM | POA: Insufficient documentation

## 2019-07-14 DIAGNOSIS — Z79899 Other long term (current) drug therapy: Secondary | ICD-10-CM | POA: Diagnosis not present

## 2019-07-14 MED ORDER — ATORVASTATIN CALCIUM 20 MG PO TABS: 20.0000 mg | ORAL_TABLET | Freq: Every day | ORAL | 1 refills | Status: DC

## 2019-07-14 MED ORDER — VARENICLINE TARTRATE 1 MG PO TABS: 1.0000 mg | ORAL_TABLET | Freq: Two times a day (BID) | ORAL | 1 refills | Status: DC

## 2019-07-14 MED ORDER — AMLODIPINE BESYLATE 5 MG PO TABS: 5.0000 mg | ORAL_TABLET | Freq: Every day | ORAL | 1 refills | Status: DC

## 2019-07-14 MED ORDER — CHANTIX STARTING MONTH PAK 0.5 MG X 11 & 1 MG X 42 PO TABS: ORAL_TABLET | ORAL | 0 refills | Status: DC

## 2019-07-14 MED ORDER — POTASSIUM CHLORIDE ER 10 MEQ PO TBCR: 10.0000 meq | EXTENDED_RELEASE_TABLET | Freq: Every morning | ORAL | 1 refills | Status: DC

## 2019-07-14 MED ORDER — HYDROCHLOROTHIAZIDE 12.5 MG PO CAPS: 12.5000 mg | ORAL_CAPSULE | ORAL | 1 refills | Status: DC

## 2019-07-14 MED ORDER — HALOBETASOL PROPIONATE 0.05 % EX CREA: TOPICAL_CREAM | CUTANEOUS | 0 refills | Status: DC

## 2019-07-14 NOTE — Patient Instructions (Signed)
Steps to Quit Smoking Smoking tobacco is the leading cause of preventable death. It can affect almost every organ in the body. Smoking puts you and people around you at risk for many serious, long-lasting (chronic) diseases. Quitting smoking can be hard, but it is one of the best things that you can do for your health. It is never too late to quit. How do I get ready to quit? When you decide to quit smoking, make a plan to help you succeed. Before you quit:  Pick a date to quit. Set a date within the next 2 weeks to give you time to prepare.  Write down the reasons why you are quitting. Keep this list in places where you will see it often.  Tell your family, friends, and co-workers that you are quitting. Their support is important.  Talk with your doctor about the choices that may help you quit.  Find out if your health insurance will pay for these treatments.  Know the people, places, things, and activities that make you want to smoke (triggers). Avoid them. What first steps can I take to quit smoking?  Throw away all cigarettes at home, at work, and in your car.  Throw away the things that you use when you smoke, such as ashtrays and lighters.  Clean your car. Make sure to empty the ashtray.  Clean your home, including curtains and carpets. What can I do to help me quit smoking? Talk with your doctor about taking medicines and seeing a counselor at the same time. You are more likely to succeed when you do both.  If you are pregnant or breastfeeding, talk with your doctor about counseling or other ways to quit smoking. Do not take medicine to help you quit smoking unless your doctor tells you to do so. To quit smoking: Quit right away  Quit smoking totally, instead of slowly cutting back on how much you smoke over a period of time.  Go to counseling. You are more likely to quit if you go to counseling sessions regularly. Take medicine You may take medicines to help you quit. Some  medicines need a prescription, and some you can buy over-the-counter. Some medicines may contain a drug called nicotine to replace the nicotine in cigarettes. Medicines may:  Help you to stop having the desire to smoke (cravings).  Help to stop the problems that come when you stop smoking (withdrawal symptoms). Your doctor may ask you to use:  Nicotine patches, gum, or lozenges.  Nicotine inhalers or sprays.  Non-nicotine medicine that is taken by mouth. Find resources Find resources and other ways to help you quit smoking and remain smoke-free after you quit. These resources are most helpful when you use them often. They include:  Online chats with a counselor.  Phone quitlines.  Printed self-help materials.  Support groups or group counseling.  Text messaging programs.  Mobile phone apps. Use apps on your mobile phone or tablet that can help you stick to your quit plan. There are many free apps for mobile phones and tablets as well as websites. Examples include Quit Guide from the CDC and smokefree.gov  What things can I do to make it easier to quit?   Talk to your family and friends. Ask them to support and encourage you.  Call a phone quitline (1-800-QUIT-NOW), reach out to support groups, or work with a counselor.  Ask people who smoke to not smoke around you.  Avoid places that make you want to smoke,   such as: ? Bars. ? Parties. ? Smoke-break areas at work.  Spend time with people who do not smoke.  Lower the stress in your life. Stress can make you want to smoke. Try these things to help your stress: ? Getting regular exercise. ? Doing deep-breathing exercises. ? Doing yoga. ? Meditating. ? Doing a body scan. To do this, close your eyes, focus on one area of your body at a time from head to toe. Notice which parts of your body are tense. Try to relax the muscles in those areas. How will I feel when I quit smoking? Day 1 to 3 weeks Within the first 24 hours,  you may start to have some problems that come from quitting tobacco. These problems are very bad 2-3 days after you quit, but they do not often last for more than 2-3 weeks. You may get these symptoms:  Mood swings.  Feeling restless, nervous, angry, or annoyed.  Trouble concentrating.  Dizziness.  Strong desire for high-sugar foods and nicotine.  Weight gain.  Trouble pooping (constipation).  Feeling like you may vomit (nausea).  Coughing or a sore throat.  Changes in how the medicines that you take for other issues work in your body.  Depression.  Trouble sleeping (insomnia). Week 3 and afterward After the first 2-3 weeks of quitting, you may start to notice more positive results, such as:  Better sense of smell and taste.  Less coughing and sore throat.  Slower heart rate.  Lower blood pressure.  Clearer skin.  Better breathing.  Fewer sick days. Quitting smoking can be hard. Do not give up if you fail the first time. Some people need to try a few times before they succeed. Do your best to stick to your quit plan, and talk with your doctor if you have any questions or concerns. Summary  Smoking tobacco is the leading cause of preventable death. Quitting smoking can be hard, but it is one of the best things that you can do for your health.  When you decide to quit smoking, make a plan to help you succeed.  Quit smoking right away, not slowly over a period of time.  When you start quitting, seek help from your doctor, family, or friends. This information is not intended to replace advice given to you by your health care provider. Make sure you discuss any questions you have with your health care provider. Document Revised: 11/22/2018 Document Reviewed: 05/18/2018 Elsevier Patient Education  2020 [employer]. DASH Eating Plan DASH stands for "Dietary Approaches to Stop Hypertension." The DASH eating plan is a healthy eating plan that has been shown to reduce  high blood pressure (hypertension). It may also reduce your risk for type 2 diabetes, heart disease, and stroke. The DASH eating plan may also help with weight loss. What are tips for following this plan?  General guidelines  Avoid eating more than 2,300 mg (milligrams) of salt (sodium) a day. If you have hypertension, you may need to reduce your sodium intake to 1,500 mg a day.  Limit alcohol intake to no more than 1 drink a day for nonpregnant women and 2 drinks a day for men. One drink equals 12 oz of beer, 5 oz of wine, or 1 oz of hard liquor.  Work with your health care provider to maintain a healthy body weight or to lose weight. Ask what an ideal weight is for you.  Get at least 30 minutes of exercise that causes your heart to   beat faster (aerobic exercise) most days of the week. Activities may include walking, swimming, or biking.  Work with your health care provider or diet and nutrition specialist (dietitian) to adjust your eating plan to your individual calorie needs. Reading food labels   Check food labels for the amount of sodium per serving. Choose foods with less than 5 percent of the Daily Value of sodium. Generally, foods with less than 300 mg of sodium per serving fit into this eating plan.  To find whole grains, look for the word "whole" as the first word in the ingredient list. Shopping  Buy products labeled as "low-sodium" or "no salt added."  Buy fresh foods. Avoid canned foods and premade or frozen meals. Cooking  Avoid adding salt when cooking. Use salt-free seasonings or herbs instead of table salt or sea salt. Check with your health care provider or pharmacist before using salt substitutes.  Do not fry foods. Cook foods using healthy methods such as baking, boiling, grilling, and broiling instead.  Cook with heart-healthy oils, such as olive, canola, soybean, or sunflower oil. Meal planning  Eat a balanced diet that includes: ? 5 or more servings of  fruits and vegetables each day. At each meal, try to fill half of your plate with fruits and vegetables. ? Up to 6-8 servings of whole grains each day. ? Less than 6 oz of lean meat, poultry, or fish each day. A 3-oz serving of meat is about the same size as a deck of cards. One egg equals 1 oz. ? 2 servings of low-fat dairy each day. ? A serving of nuts, seeds, or beans 5 times each week. ? Heart-healthy fats. Healthy fats called Omega-3 fatty acids are found in foods such as flaxseeds and coldwater fish, like sardines, salmon, and mackerel.  Limit how much you eat of the following: ? Canned or prepackaged foods. ? Food that is high in trans fat, such as fried foods. ? Food that is high in saturated fat, such as fatty meat. ? Sweets, desserts, sugary drinks, and other foods with added sugar. ? Full-fat dairy products.  Do not salt foods before eating.  Try to eat at least 2 vegetarian meals each week.  Eat more home-cooked food and less restaurant, buffet, and fast food.  When eating at a restaurant, ask that your food be prepared with less salt or no salt, if possible. What foods are recommended? The items listed may not be a complete list. Talk with your dietitian about what dietary choices are best for you. Grains Whole-grain or whole-wheat bread. Whole-grain or whole-wheat pasta. Brown rice. Oatmeal. Quinoa. Bulgur. Whole-grain and low-sodium cereals. Pita bread. Low-fat, low-sodium crackers. Whole-wheat flour tortillas. Vegetables Fresh or frozen vegetables (raw, steamed, roasted, or grilled). Low-sodium or reduced-sodium tomato and vegetable juice. Low-sodium or reduced-sodium tomato sauce and tomato paste. Low-sodium or reduced-sodium canned vegetables. Fruits All fresh, dried, or frozen fruit. Canned fruit in natural juice (without added sugar). Meat and other protein foods Skinless chicken or turkey. Ground chicken or turkey. Pork with fat trimmed off. Fish and seafood. Egg  whites. Dried beans, peas, or lentils. Unsalted nuts, nut butters, and seeds. Unsalted canned beans. Lean cuts of beef with fat trimmed off. Low-sodium, lean deli meat. Dairy Low-fat (1%) or fat-free (skim) milk. Fat-free, low-fat, or reduced-fat cheeses. Nonfat, low-sodium ricotta or cottage cheese. Low-fat or nonfat yogurt. Low-fat, low-sodium cheese. Fats and oils Soft margarine without trans fats. Vegetable oil. Low-fat, reduced-fat, or light mayonnaise and salad dressings (  reduced-sodium). Canola, safflower, olive, soybean, and sunflower oils. Avocado. Seasoning and other foods Herbs. Spices. Seasoning mixes without salt. Unsalted popcorn and pretzels. Fat-free sweets. What foods are not recommended? The items listed may not be a complete list. Talk with your dietitian about what dietary choices are best for you. Grains Baked goods made with fat, such as croissants, muffins, or some breads. Dry pasta or rice meal packs. Vegetables Creamed or fried vegetables. Vegetables in a cheese sauce. Regular canned vegetables (not low-sodium or reduced-sodium). Regular canned tomato sauce and paste (not low-sodium or reduced-sodium). Regular tomato and vegetable juice (not low-sodium or reduced-sodium). Pickles. Olives. Fruits Canned fruit in a light or heavy syrup. Fried fruit. Fruit in cream or butter sauce. Meat and other protein foods Fatty cuts of meat. Ribs. Fried meat. Bacon. Sausage. Bologna and other processed lunch meats. Salami. Fatback. Hotdogs. Bratwurst. Salted nuts and seeds. Canned beans with added salt. Canned or smoked fish. Whole eggs or egg yolks. Chicken or turkey with skin. Dairy Whole or 2% milk, cream, and half-and-half. Whole or full-fat cream cheese. Whole-fat or sweetened yogurt. Full-fat cheese. Nondairy creamers. Whipped toppings. Processed cheese and cheese spreads. Fats and oils Butter. Stick margarine. Lard. Shortening. Ghee. Bacon fat. Tropical oils, such as coconut,  palm kernel, or palm oil. Seasoning and other foods Salted popcorn and pretzels. Onion salt, garlic salt, seasoned salt, table salt, and sea salt. Worcestershire sauce. Tartar sauce. Barbecue sauce. Teriyaki sauce. Soy sauce, including reduced-sodium. Steak sauce. Canned and packaged gravies. Fish sauce. Oyster sauce. Cocktail sauce. Horseradish that you find on the shelf. Ketchup. Mustard. Meat flavorings and tenderizers. Bouillon cubes. Hot sauce and Tabasco sauce. Premade or packaged marinades. Premade or packaged taco seasonings. Relishes. Regular salad dressings. Where to find more information:  National Heart, Lung, and Blood Institute: www.nhlbi.nih.gov  American Heart Association: www.heart.org Summary  The DASH eating plan is a healthy eating plan that has been shown to reduce high blood pressure (hypertension). It may also reduce your risk for type 2 diabetes, heart disease, and stroke.  With the DASH eating plan, you should limit salt (sodium) intake to 2,300 mg a day. If you have hypertension, you may need to reduce your sodium intake to 1,500 mg a day.  When on the DASH eating plan, aim to eat more fresh fruits and vegetables, whole grains, lean proteins, low-fat dairy, and heart-healthy fats.  Work with your health care provider or diet and nutrition specialist (dietitian) to adjust your eating plan to your individual calorie needs. This information is not intended to replace advice given to you by your health care provider. Make sure you discuss any questions you have with your health care provider. Document Revised: 02/09/2017 Document Reviewed: 02/21/2016 Elsevier Patient Education  2020 Elsevier Inc.  

## 2019-07-14 NOTE — Progress Notes (Signed)
   Subjective:    Patient ID: Anthony Little, male    DOB: 28-Mar-1950, 69 y.o.   MRN: [id number]  HPIfollow up on ct results.  We reviewed over all of the CT report Answered questions Reviewed over recent lab work Did discuss smoking and the need to quit smoking Patient denies any type of chest tightness pressure pain shortness of breath Patient is having a rash on his ankles for which triamcinolone is not helping Would like to start back on chantix.  Triamcinolone cream and elocon cream not helping with rash on right ankle. Always itches.   Fall Risk  07/14/2019 05/03/2017 09/07/2015  Falls in the past year? 0 No Yes  Number falls in past yr: - - 1  Injury with Fall? - - No  Follow up - - Falls evaluation completed     Review of Systems     Objective:   Physical Exam Lungs clear respiratory rate normal heart regular no murmurs extremities no edema skin warm dry  No skull comfort on and go yesterday said the fall stops going up right now     Assessment & Plan:  1. Essential hypertension Subpar control add HCTZ and potassium follow-up again in 3 months - Basic metabolic panel  2. Thoracic aorta atherosclerosis ([place]) Dilated thoracic aorta very important for the patient to quit smoking keep blood pressure cholesterol under good control we will recheck this again with CTA in approximately 1 year patient was counseled regarding how this could be very dangerous if not followed  3. Screening for prostate cancer PSA before his next visit - PSA  4. Other hyperlipidemia subpar control of his cholesterol very important to get LDL below 70 Increased dose of Lipitor 20 mg daily lipid before next visit - Lipid panel  5. High risk medication use See below - Hepatic function panel  Coronary artery disease based on CAT scan but patient not having any angina symptoms very important to quit smoking keep blood pressure under good control keep cholesterol under excellent  control  Pulmonary nodule stable noncancerous yearly CT screening recommended  Dyshidrotic eczema recommend Ultravate cream

## 2019-07-15 NOTE — Progress Notes (Signed)
Reminder for CTA Thoracic placed in reminder file for April 2022

## 2019-07-17 ENCOUNTER — Telehealth: Payer: Self-pay | Admitting: Family Medicine

## 2019-07-17 NOTE — Telephone Encounter (Signed)
Nurses Please make sure that repeat CT scan of the chest CTA is ordered to be completed again March 2022 due to Ectatic ascending thoracic aorta (This may have been done the other day when the patient was seen but I was not certain it was thank you)

## 2019-07-17 NOTE — Telephone Encounter (Signed)
This was added to tickler file

## 2019-09-02 ENCOUNTER — Ambulatory Visit: Payer: Medicare Other | Admitting: Family Medicine

## 2019-10-14 ENCOUNTER — Ambulatory Visit: Payer: Medicare Other | Admitting: Family Medicine

## 2019-10-14 DIAGNOSIS — Z125 Encounter for screening for malignant neoplasm of prostate: Secondary | ICD-10-CM | POA: Diagnosis not present

## 2019-10-14 DIAGNOSIS — E7849 Other hyperlipidemia: Secondary | ICD-10-CM | POA: Diagnosis not present

## 2019-10-14 DIAGNOSIS — Z79899 Other long term (current) drug therapy: Secondary | ICD-10-CM | POA: Diagnosis not present

## 2019-10-14 DIAGNOSIS — I10 Essential (primary) hypertension: Secondary | ICD-10-CM | POA: Diagnosis not present

## 2019-10-15 DIAGNOSIS — H26493 Other secondary cataract, bilateral: Secondary | ICD-10-CM | POA: Diagnosis not present

## 2019-10-15 DIAGNOSIS — H524 Presbyopia: Secondary | ICD-10-CM | POA: Diagnosis not present

## 2019-10-15 DIAGNOSIS — Z961 Presence of intraocular lens: Secondary | ICD-10-CM | POA: Diagnosis not present

## 2019-10-15 LAB — BASIC METABOLIC PANEL
BUN/Creatinine Ratio: 17 (ref 10–24)
BUN: 24 mg/dL (ref 8–27)
CO2: 23 mmol/L (ref 20–29)
Calcium: 9.7 mg/dL (ref 8.6–10.2)
Chloride: 101 mmol/L (ref 96–106)
Creatinine, Ser: 1.44 mg/dL — ABNORMAL HIGH (ref 0.76–1.27)
GFR calc Af Amer: 57 mL/min/{1.73_m2} — ABNORMAL LOW (ref >59)
GFR calc non Af Amer: 50 mL/min/{1.73_m2} — ABNORMAL LOW (ref >59)
Glucose: 103 mg/dL — ABNORMAL HIGH (ref 65–99)
Potassium: 5.8 mmol/L — ABNORMAL HIGH (ref 3.5–5.2)
Sodium: 139 mmol/L (ref 134–144)

## 2019-10-15 LAB — HEPATIC FUNCTION PANEL
ALT: 12 IU/L (ref 0–44)
AST: 19 IU/L (ref 0–40)
Albumin: 4.6 g/dL (ref 3.8–4.8)
Alkaline Phosphatase: 62 IU/L (ref 48–121)
Bilirubin Total: 0.6 mg/dL (ref 0.0–1.2)
Bilirubin, Direct: 0.18 mg/dL (ref 0.00–0.40)
Total Protein: 6.9 g/dL (ref 6.0–8.5)

## 2019-10-15 LAB — PSA: Prostate Specific Ag, Serum: 1.1 ng/mL (ref 0.0–4.0)

## 2019-10-15 LAB — LIPID PANEL
Chol/HDL Ratio: 2.1 ratio (ref 0.0–5.0)
Cholesterol, Total: 167 mg/dL (ref 100–199)
HDL: 81 mg/dL (ref >39)
LDL Chol Calc (NIH): 76 mg/dL (ref 0–99)
Triglycerides: 48 mg/dL (ref 0–149)
VLDL Cholesterol Cal: 10 mg/dL (ref 5–40)

## 2019-10-20 ENCOUNTER — Other Ambulatory Visit: Payer: Self-pay

## 2019-10-20 ENCOUNTER — Encounter: Payer: Self-pay | Admitting: Family Medicine

## 2019-10-20 ENCOUNTER — Ambulatory Visit (INDEPENDENT_AMBULATORY_CARE_PROVIDER_SITE_OTHER): Payer: Medicare Other | Admitting: Family Medicine

## 2019-10-20 VITALS — BP 138/86 | Temp 97.5°F | Ht 70.0 in | Wt 203.6 lb

## 2019-10-20 DIAGNOSIS — Z23 Encounter for immunization: Secondary | ICD-10-CM

## 2019-10-20 DIAGNOSIS — I2510 Atherosclerotic heart disease of native coronary artery without angina pectoris: Secondary | ICD-10-CM | POA: Diagnosis not present

## 2019-10-20 DIAGNOSIS — E875 Hyperkalemia: Secondary | ICD-10-CM | POA: Diagnosis not present

## 2019-10-20 NOTE — Patient Instructions (Signed)
Please reduce your potassium to 1 pill twice a week Keep everything else as is Repeat your blood work in 3 to 4 weeks Follow-up in 4 months Please bring your blood pressure cuff with you when you come   [place] stands for "Dietary Approaches to Stop Hypertension." The DASH eating plan is a healthy eating plan that has been shown to reduce high blood pressure (hypertension). It may also reduce your risk for type 2 diabetes, heart disease, and stroke. The DASH eating plan may also help with weight loss. What are tips for following this plan?  General guidelines  Avoid eating more than 2,300 mg (milligrams) of salt (sodium) a day. If you have hypertension, you may need to reduce your sodium intake to 1,500 mg a day.  Limit alcohol intake to no more than 1 drink a day for nonpregnant women and 2 drinks a day for men. One drink equals 12 oz of beer, 5 oz of wine, or 1 oz of hard liquor.  Work with your health care provider to maintain a healthy body weight or to lose weight. Ask what an ideal weight is for you.  Get at least 30 minutes of exercise that causes your heart to beat faster (aerobic exercise) most days of the week. Activities may include walking, swimming, or biking.  Work with your health care provider or diet and nutrition specialist (dietitian) to adjust your eating plan to your individual calorie needs. Reading food labels   Check food labels for the amount of sodium per serving. Choose foods with less than 5 percent of the Daily Value of sodium. Generally, foods with less than 300 mg of sodium per serving fit into this eating plan.  To find whole grains, look for the word "whole" as the first word in the ingredient list. Shopping  Buy products labeled as "low-sodium" or "no salt added."  Buy fresh foods. Avoid canned foods and premade or frozen meals. Cooking  Avoid adding salt when cooking. Use salt-free seasonings or herbs instead of table salt or sea  salt. Check with your health care provider or pharmacist before using salt substitutes.  Do not fry foods. Cook foods using healthy methods such as baking, boiling, grilling, and broiling instead.  Cook with heart-healthy oils, such as olive, canola, soybean, or sunflower oil. Meal planning  Eat a balanced diet that includes: ? 5 or more servings of fruits and vegetables each day. At each meal, try to fill half of your plate with fruits and vegetables. ? Up to 6-8 servings of whole grains each day. ? Less than 6 oz of lean meat, poultry, or fish each day. A 3-oz serving of meat is about the same size as a deck of cards. One egg equals 1 oz. ? 2 servings of low-fat dairy each day. ? A serving of nuts, seeds, or beans 5 times each week. ? Heart-healthy fats. Healthy fats called Omega-3 fatty acids are found in foods such as flaxseeds and coldwater fish, like sardines, salmon, and mackerel.  Limit how much you eat of the following: ? Canned or prepackaged foods. ? Food that is high in trans fat, such as fried foods. ? Food that is high in saturated fat, such as fatty meat. ? Sweets, desserts, sugary drinks, and other foods with added sugar. ? Full-fat dairy products.  Do not salt foods before eating.  Try to eat at least 2 vegetarian meals each week.  Eat more home-cooked food and less restaurant, buffet,  and fast food.  When eating at a restaurant, ask that your food be prepared with less salt or no salt, if possible. What foods are recommended? The items listed may not be a complete list. Talk with your dietitian about what dietary choices are best for you. Grains Whole-grain or whole-wheat bread. Whole-grain or whole-wheat pasta. Brown rice. [place]. Bulgur. Whole-grain and low-sodium cereals. Pita bread. Low-fat, low-sodium crackers. Whole-wheat flour tortillas. Vegetables Fresh or frozen vegetables (raw, steamed, roasted, or grilled). Low-sodium or reduced-sodium tomato  and vegetable juice. Low-sodium or reduced-sodium tomato sauce and tomato paste. Low-sodium or reduced-sodium canned vegetables. Fruits All fresh, dried, or frozen fruit. Canned fruit in natural juice (without added sugar). Meat and other protein foods Skinless chicken or [place]. Ground chicken or [place]. Pork with fat trimmed off. Fish and seafood. Egg whites. Dried beans, peas, or lentils. Unsalted nuts, nut butters, and seeds. Unsalted canned beans. Lean cuts of beef with fat trimmed off. Low-sodium, lean deli meat. Dairy Low-fat (1%) or fat-free (skim) milk. Fat-free, low-fat, or reduced-fat cheeses. Nonfat, low-sodium ricotta or cottage cheese. Low-fat or nonfat yogurt. Low-fat, low-sodium cheese. Fats and oils Soft margarine without trans fats. Vegetable oil. Low-fat, reduced-fat, or light mayonnaise and salad dressings (reduced-sodium). Canola, safflower, olive, soybean, and sunflower oils. Avocado. Seasoning and other foods Herbs. Spices. Seasoning mixes without salt. Unsalted popcorn and pretzels. Fat-free sweets. What foods are not recommended? The items listed may not be a complete list. Talk with your dietitian about what dietary choices are best for you. Grains Baked goods made with fat, such as croissants, muffins, or some breads. Dry pasta or rice meal packs. Vegetables Creamed or fried vegetables. Vegetables in a cheese sauce. Regular canned vegetables (not low-sodium or reduced-sodium). Regular canned tomato sauce and paste (not low-sodium or reduced-sodium). Regular tomato and vegetable juice (not low-sodium or reduced-sodium). Angie Fava. Olives. Fruits Canned fruit in a light or heavy syrup. Fried fruit. Fruit in cream or butter sauce. Meat and other protein foods Fatty cuts of meat. Ribs. Fried meat. Berniece Salines. Sausage. Bologna and other processed lunch meats. Salami. Fatback. Hotdogs. Bratwurst. Salted nuts and seeds. Canned beans with added salt. Canned or smoked fish. Whole eggs  or egg yolks. Chicken or [place] with skin. Dairy Whole or 2% milk, cream, and half-and-half. Whole or full-fat cream cheese. Whole-fat or sweetened yogurt. Full-fat cheese. Nondairy creamers. Whipped toppings. Processed cheese and cheese spreads. Fats and oils Butter. Stick margarine. Lard. Shortening. Ghee. Bacon fat. Tropical oils, such as coconut, palm kernel, or palm oil. Seasoning and other foods Salted popcorn and pretzels. Onion salt, garlic salt, seasoned salt, table salt, and sea salt. Worcestershire sauce. Tartar sauce. Barbecue sauce. Teriyaki sauce. Soy sauce, including reduced-sodium. Steak sauce. Canned and packaged gravies. Fish sauce. Oyster sauce. Cocktail sauce. Horseradish that you find on the shelf. Ketchup. Mustard. Meat flavorings and tenderizers. Bouillon cubes. Hot sauce and Tabasco sauce. Premade or packaged marinades. Premade or packaged taco seasonings. Relishes. Regular salad dressings. Where to find more information:  National Heart, Lung, and [place]: https://wilson-eaton.com/  American Heart Association: www.heart.org Summary  The DASH eating plan is a healthy eating plan that has been shown to reduce high blood pressure (hypertension). It may also reduce your risk for type 2 diabetes, heart disease, and stroke.  With the DASH eating plan, you should limit salt (sodium) intake to 2,300 mg a day. If you have hypertension, you may need to reduce your sodium intake to 1,500 mg a day.  When on  the DASH eating plan, aim to eat more fresh fruits and vegetables, whole grains, lean proteins, low-fat dairy, and heart-healthy fats.  Work with your health care provider or diet and nutrition specialist (dietitian) to adjust your eating plan to your individual calorie needs. This information is not intended to replace advice given to you by your health care provider. Make sure you discuss any questions you have with your health care provider. Document Revised: 02/09/2017  Document Reviewed: 02/21/2016 Elsevier Patient Education  2020 [employer].

## 2019-10-20 NOTE — Progress Notes (Signed)
Subjective:    Patient ID: Anthony Little, male    DOB: 10/08/1950, 69 y.o.   MRN: [id number]  Hypertension This is a chronic problem. The current episode started more than 1 year ago. Pertinent negatives include no chest pain, headaches or shortness of breath. Risk factors for coronary artery disease include dyslipidemia and male gender. Treatments tried: norvasc, hctz. There are no compliance problems.    Hyperkalemia - Plan: Basic metabolic panel  Need for vaccination - Plan: Pneumococcal polysaccharide vaccine 23-valent greater than or equal to 2yo subcutaneous/IM     Review of Systems  Constitutional: Negative for diaphoresis and fatigue.  HENT: Negative for congestion and rhinorrhea.   Respiratory: Negative for cough and shortness of breath.   Cardiovascular: Negative for chest pain and leg swelling.  Gastrointestinal: Negative for abdominal pain and diarrhea.  Skin: Negative for color change and rash.  Neurological: Negative for dizziness and headaches.  Psychiatric/Behavioral: Negative for behavioral problems and confusion.       Objective:   Physical Exam Vitals reviewed.  Constitutional:      General: He is not in acute distress. HENT:     Head: Normocephalic and atraumatic.  Eyes:     General:        Right eye: No discharge.        Left eye: No discharge.  Neck:     Trachea: No tracheal deviation.  Cardiovascular:     Rate and Rhythm: Normal rate and regular rhythm.     Heart sounds: Normal heart sounds. No murmur heard.   Pulmonary:     Effort: Pulmonary effort is normal. No respiratory distress.     Breath sounds: Normal breath sounds.  Lymphadenopathy:     Cervical: No cervical adenopathy.  Skin:    General: Skin is warm and dry.  Neurological:     Mental Status: He is alert.     Coordination: Coordination normal.  Psychiatric:        Behavior: Behavior normal.    Results for orders placed or performed in visit on 07/14/19  Lipid panel    Result Value Ref Range   Cholesterol, Total 167 100 - 199 mg/dL   Triglycerides 48 0 - 149 mg/dL   HDL 81 >39 mg/dL   VLDL Cholesterol Cal 10 5 - 40 mg/dL   LDL Chol Calc (NIH) 76 0 - 99 mg/dL   Chol/HDL Ratio 2.1 0.0 - 5.0 ratio  Hepatic function panel  Result Value Ref Range   Total Protein 6.9 6.0 - 8.5 g/dL   Albumin 4.6 3.8 - 4.8 g/dL   Bilirubin Total 0.6 0.0 - 1.2 mg/dL   Bilirubin, Direct 0.18 0.00 - 0.40 mg/dL   Alkaline Phosphatase 62 48 - 121 IU/L   AST 19 0 - 40 IU/L   ALT 12 0 - 44 IU/L  Basic metabolic panel  Result Value Ref Range   Glucose 103 (H) 65 - 99 mg/dL   BUN 24 8 - 27 mg/dL   Creatinine, Ser 1.44 (H) 0.76 - 1.27 mg/dL   GFR calc non Af Amer 50 (L) >59 mL/min/1.73   GFR calc Af Amer 57 (L) >59 mL/min/1.73   BUN/Creatinine Ratio 17 10 - 24   Sodium 139 134 - 144 mmol/L   Potassium 5.8 (H) 3.5 - 5.2 mmol/L   Chloride 101 96 - 106 mmol/L   CO2 23 20 - 29 mmol/L   Calcium 9.7 8.6 - 10.2 mg/dL  PSA  Result Value Ref  Range   Prostate Specific Ag, Serum 1.1 0.0 - 4.0 ng/mL          Assessment & Plan:  LDL near goal patient states he can do a better job watching diet we will recheck lab work again within 6 months  Blood pressure good control continue current measures  Elevated potassium reduce the potassium to just 2 days/week recheck this again in a few weeks, patient having some potassium retention due to mild renal disease  Best approach for renal disease keep blood pressure under control quit smoking in minimize protein in diet  Patient was encouraged to minimize protein in diet  Patient was encouraged to quit smoking  We will repeat thoracic aorta CT scan in May of next year  Patient to follow-up in 4 months

## 2019-10-21 ENCOUNTER — Encounter: Payer: Self-pay | Admitting: *Deleted

## 2019-10-21 NOTE — Progress Notes (Signed)
10/21/19 note added to med list that he takes one tablet 2 days a week

## 2019-10-21 NOTE — Progress Notes (Signed)
Added to reminder file ?

## 2019-11-25 ENCOUNTER — Other Ambulatory Visit: Payer: Self-pay | Admitting: *Deleted

## 2019-11-25 DIAGNOSIS — I2510 Atherosclerotic heart disease of native coronary artery without angina pectoris: Secondary | ICD-10-CM

## 2019-12-10 ENCOUNTER — Telehealth: Payer: Self-pay | Admitting: Family Medicine

## 2019-12-10 NOTE — Telephone Encounter (Signed)
[facility] - need to notify of ultrasound appt info   Wednesday, October 6 arive @ [facility] 2:15pm

## 2019-12-16 NOTE — Telephone Encounter (Signed)
Pt returned call, nurse gave pt appt info

## 2019-12-17 ENCOUNTER — Other Ambulatory Visit: Payer: Self-pay

## 2019-12-17 ENCOUNTER — Ambulatory Visit (HOSPITAL_COMMUNITY)
Admission: RE | Admit: 2019-12-17 | Discharge: 2019-12-17 | Disposition: A | Discharge status: Home / Self Care | Payer: Medicare Other | Source: Ambulatory Visit | Attending: Family Medicine | Admitting: Family Medicine

## 2019-12-17 DIAGNOSIS — I6523 Occlusion and stenosis of bilateral carotid arteries: Secondary | ICD-10-CM | POA: Diagnosis not present

## 2019-12-17 DIAGNOSIS — I2510 Atherosclerotic heart disease of native coronary artery without angina pectoris: Secondary | ICD-10-CM | POA: Diagnosis not present

## 2019-12-18 ENCOUNTER — Other Ambulatory Visit: Payer: Self-pay

## 2019-12-18 MED ORDER — ATORVASTATIN CALCIUM 40 MG PO TABS: ORAL_TABLET | ORAL | 1 refills | Status: DC

## 2020-01-17 ENCOUNTER — Other Ambulatory Visit: Payer: Self-pay | Admitting: Family Medicine

## 2020-01-29 ENCOUNTER — Other Ambulatory Visit: Payer: Self-pay | Admitting: Family Medicine

## 2020-03-16 ENCOUNTER — Other Ambulatory Visit: Payer: Self-pay | Admitting: Family Medicine

## 2020-03-16 LAB — BASIC METABOLIC PANEL
BUN/Creatinine Ratio: 11 (ref 10–24)
BUN: 23 mg/dL (ref 8–27)
CO2: 25 mmol/L (ref 20–29)
Calcium: 9.4 mg/dL (ref 8.6–10.2)
Chloride: 95 mmol/L — ABNORMAL LOW (ref 96–106)
Creatinine, Ser: 2.01 mg/dL — ABNORMAL HIGH (ref 0.76–1.27)
GFR calc Af Amer: 38 mL/min/{1.73_m2} — ABNORMAL LOW (ref >59)
GFR calc non Af Amer: 33 mL/min/{1.73_m2} — ABNORMAL LOW (ref >59)
Glucose: 99 mg/dL (ref 65–99)
Potassium: 5 mmol/L (ref 3.5–5.2)
Sodium: 137 mmol/L (ref 134–144)

## 2020-03-18 ENCOUNTER — Other Ambulatory Visit: Payer: Self-pay

## 2020-03-18 ENCOUNTER — Ambulatory Visit (INDEPENDENT_AMBULATORY_CARE_PROVIDER_SITE_OTHER): Payer: Medicare Other | Admitting: Family Medicine

## 2020-03-18 VITALS — BP 128/74 | HR 62 | Temp 97.7°F | Ht 70.0 in | Wt 195.0 lb

## 2020-03-18 DIAGNOSIS — D692 Other nonthrombocytopenic purpura: Secondary | ICD-10-CM

## 2020-03-18 DIAGNOSIS — I700 Atherosclerosis of aorta: Secondary | ICD-10-CM

## 2020-03-18 DIAGNOSIS — R7989 Other specified abnormal findings of blood chemistry: Secondary | ICD-10-CM

## 2020-03-18 DIAGNOSIS — M549 Dorsalgia, unspecified: Secondary | ICD-10-CM | POA: Diagnosis not present

## 2020-03-18 DIAGNOSIS — I77810 Thoracic aortic ectasia: Secondary | ICD-10-CM | POA: Diagnosis not present

## 2020-03-18 DIAGNOSIS — F321 Major depressive disorder, single episode, moderate: Secondary | ICD-10-CM

## 2020-03-18 MED ORDER — AMLODIPINE BESYLATE 5 MG PO TABS: 5.0000 mg | ORAL_TABLET | Freq: Every day | ORAL | 1 refills | Status: DC

## 2020-03-18 MED ORDER — BUPROPION HCL ER (XL) 150 MG PO TB24: 150.0000 mg | ORAL_TABLET | Freq: Every day | ORAL | 4 refills | Status: DC

## 2020-03-18 MED ORDER — ATORVASTATIN CALCIUM 40 MG PO TABS: ORAL_TABLET | ORAL | 1 refills | Status: DC

## 2020-03-18 NOTE — Progress Notes (Signed)
   Subjective:    Patient ID: Anthony Little, male    DOB: October 11, 1950, 70 y.o.   MRN: [id number]  HPIlow back pain on both sides. Pt states pain started a year ago off and on and getting more often. Takes gas x and the pain lets up.  Patient relates severe pain in the back bilateral.  Lasts anywhere from a few minutes to 30 minutes.  At times so severe he feels like he needs to lay down or pass out He denies any loss of consciousness with these Denies any abdominal pressure or pain no vomiting or rectal bleeding no dysuria no hematuria.  Pt states he is suppose to be on atorvastatin 40mg  but pharm last filled 20mg  . Needs new rx for 40mg     Review of Systems  Constitutional: Negative for activity change, fatigue and fever.  HENT: Negative for congestion and rhinorrhea.   Respiratory: Negative for cough and shortness of breath.   Cardiovascular: Negative for chest pain and leg swelling.  Gastrointestinal: Negative for abdominal pain, diarrhea and nausea.  Genitourinary: Negative for dysuria and hematuria.  Neurological: Negative for weakness and headaches.  Psychiatric/Behavioral: Negative for agitation and behavioral problems.       Objective:   Physical Exam Vitals reviewed.  Constitutional:      Appearance: He is well-nourished.  Cardiovascular:     Rate and Rhythm: Normal rate and regular rhythm.     Heart sounds: Normal heart sounds. No murmur heard.   Pulmonary:     Effort: Pulmonary effort is normal.     Breath sounds: Normal breath sounds.  Musculoskeletal:        General: No edema.  Lymphadenopathy:     Cervical: No cervical adenopathy.  Neurological:     Mental Status: He is alert.  Psychiatric:        Behavior: Behavior normal.    No pain or discomfort with rotation abdomen is soft no guarding or rebound flanks are nontender to percussion spine is nontender to percussion Senile purpura noted Patient under a lot of stress find himself feeling depressed  denies being suicidal.  Having a lot of family issues with his grandchildren and custody issues     Assessment & Plan:  1. Elevated serum creatinine Creatinine is going up and rather considerably over the past year.  Based upon this more than likely related to blood pressure history and smoking history but need to rule out other causes.  Start off with urine micro protein urinalysis as well as ultrasound of the kidneys and referral to nephrology - Microalbumin / creatinine urine ratio - Urinalysis - Abdomen Complete - Ambulatory referral to Nephrology  2. Back pain, unspecified back location, unspecified back pain laterality, unspecified chronicity With this back pain we will start him for these ultrasounds first if that does not reveal the issue may will need to do some lumbar and thoracic spine x-rays - Microalbumin / creatinine urine ratio - Urinalysis - Abdomen Complete - Ambulatory referral to Nephrology Senile purpura no abnormal bleeding Aortic atherosclerosis continue statin medicine on a regular basis Ectatic thoracic aorta not causing any problems currently but will follow closely I doubt that this back pain is due to that  Major depression moderate because of him smoking and also trying to help with his depression we will try Wellbutrin 150 mg XL 1 daily we will follow-up in approximately 4 weeks virtual.  Because of his kidney function will not exceed 150 mg.

## 2020-03-19 LAB — MICROALBUMIN / CREATININE URINE RATIO
Creatinine, Urine: 324.1 mg/dL
Microalb/Creat Ratio: 32 mg/g creat — ABNORMAL HIGH (ref 0–29)
Microalbumin, Urine: 105.2 ug/mL

## 2020-03-19 LAB — URINALYSIS
Bilirubin, UA: NEGATIVE
Glucose, UA: NEGATIVE
Nitrite, UA: NEGATIVE
RBC, UA: NEGATIVE
Specific Gravity, UA: 1.024 (ref 1.005–1.030)
Urobilinogen, Ur: 1 mg/dL (ref 0.2–1.0)
pH, UA: 5.5 (ref 5.0–7.5)

## 2020-03-25 ENCOUNTER — Ambulatory Visit (HOSPITAL_COMMUNITY)
Admission: RE | Admit: 2020-03-25 | Discharge: 2020-03-25 | Disposition: A | Discharge status: Home / Self Care | Payer: Medicare Other | Source: Ambulatory Visit | Attending: Family Medicine | Admitting: Family Medicine

## 2020-03-25 ENCOUNTER — Other Ambulatory Visit: Payer: Self-pay

## 2020-03-25 DIAGNOSIS — N200 Calculus of kidney: Secondary | ICD-10-CM | POA: Diagnosis not present

## 2020-03-25 DIAGNOSIS — R7989 Other specified abnormal findings of blood chemistry: Secondary | ICD-10-CM | POA: Insufficient documentation

## 2020-03-25 DIAGNOSIS — M549 Dorsalgia, unspecified: Secondary | ICD-10-CM | POA: Diagnosis not present

## 2020-03-25 DIAGNOSIS — N1330 Unspecified hydronephrosis: Secondary | ICD-10-CM | POA: Diagnosis not present

## 2020-03-26 DIAGNOSIS — N1832 Chronic kidney disease, stage 3b: Secondary | ICD-10-CM | POA: Diagnosis not present

## 2020-03-26 DIAGNOSIS — Z6828 Body mass index (BMI) 28.0-28.9, adult: Secondary | ICD-10-CM | POA: Diagnosis not present

## 2020-03-26 DIAGNOSIS — R809 Proteinuria, unspecified: Secondary | ICD-10-CM | POA: Diagnosis not present

## 2020-03-26 DIAGNOSIS — Z79899 Other long term (current) drug therapy: Secondary | ICD-10-CM | POA: Diagnosis not present

## 2020-03-26 DIAGNOSIS — I129 Hypertensive chronic kidney disease with stage 1 through stage 4 chronic kidney disease, or unspecified chronic kidney disease: Secondary | ICD-10-CM | POA: Diagnosis not present

## 2020-03-26 DIAGNOSIS — R718 Other abnormality of red blood cells: Secondary | ICD-10-CM | POA: Diagnosis not present

## 2020-03-26 DIAGNOSIS — I714 Abdominal aortic aneurysm, without rupture: Secondary | ICD-10-CM | POA: Diagnosis not present

## 2020-03-26 DIAGNOSIS — I5032 Chronic diastolic (congestive) heart failure: Secondary | ICD-10-CM | POA: Diagnosis not present

## 2020-03-26 DIAGNOSIS — K760 Fatty (change of) liver, not elsewhere classified: Secondary | ICD-10-CM | POA: Diagnosis not present

## 2020-03-26 DIAGNOSIS — Z716 Tobacco abuse counseling: Secondary | ICD-10-CM | POA: Diagnosis not present

## 2020-03-26 DIAGNOSIS — N281 Cyst of kidney, acquired: Secondary | ICD-10-CM | POA: Diagnosis not present

## 2020-03-26 DIAGNOSIS — N170 Acute kidney failure with tubular necrosis: Secondary | ICD-10-CM | POA: Diagnosis not present

## 2020-04-05 ENCOUNTER — Other Ambulatory Visit: Payer: Self-pay | Admitting: *Deleted

## 2020-04-05 DIAGNOSIS — M549 Dorsalgia, unspecified: Secondary | ICD-10-CM

## 2020-04-10 ENCOUNTER — Other Ambulatory Visit: Payer: Self-pay | Admitting: Family Medicine

## 2020-04-20 ENCOUNTER — Other Ambulatory Visit: Payer: Self-pay | Admitting: Family Medicine

## 2020-04-21 ENCOUNTER — Encounter: Payer: Self-pay | Admitting: Family Medicine

## 2020-04-21 ENCOUNTER — Other Ambulatory Visit: Payer: Self-pay

## 2020-04-21 ENCOUNTER — Encounter: Payer: Medicare Other | Admitting: Family Medicine

## 2020-04-21 NOTE — Progress Notes (Deleted)
   Subjective:    Patient ID: Anthony Little, male    DOB: 1950/12/28, 70 y.o.   MRN: [id number]  HPIFollow up on depression. Started on wellbutrin 150mg  XL 4 weeks ago.   Virtual Visit via Telephone Note  I connected with Anthony Little on 04/21/20 at  1:10 PM EST by telephone and verified that I am speaking with the correct person using two identifiers.  Location: Patient: home Provider: office   I discussed the limitations, risks, security and privacy concerns of performing an evaluation and management service by telephone and the availability of in person appointments. I also discussed with the patient that there may be a patient responsible charge related to this service. The patient expressed understanding and agreed to proceed.   History of Present Illness:    Observations/Objective:   Assessment and Plan:   Follow Up Instructions:    I discussed the assessment and treatment plan with the patient. The patient was provided an opportunity to ask questions and all were answered. The patient agreed with the plan and demonstrated an understanding of the instructions.   The patient was advised to call back or seek an in-person evaluation if the symptoms worsen or if the condition fails to improve as anticipated.  I provided *** minutes of non-face-to-face time during this encounter.     Review of Systems     Objective:   Physical Exam        Assessment & Plan:

## 2020-04-22 ENCOUNTER — Other Ambulatory Visit: Payer: Self-pay | Admitting: Family Medicine

## 2020-04-28 ENCOUNTER — Other Ambulatory Visit: Payer: Self-pay | Admitting: Family Medicine

## 2020-05-03 ENCOUNTER — Ambulatory Visit (INDEPENDENT_AMBULATORY_CARE_PROVIDER_SITE_OTHER): Payer: Medicare Other | Admitting: Family Medicine

## 2020-05-03 ENCOUNTER — Other Ambulatory Visit: Payer: Self-pay

## 2020-05-03 VITALS — BP 138/86 | Ht 70.0 in | Wt 195.0 lb

## 2020-05-03 DIAGNOSIS — K921 Melena: Secondary | ICD-10-CM | POA: Diagnosis not present

## 2020-05-03 LAB — POCT HEMOGLOBIN: Hemoglobin: 11.8 g/dL (ref 11–14.6)

## 2020-05-03 NOTE — Progress Notes (Addendum)
   Subjective:    Patient ID: Anthony Little, male    DOB: [birth year], 71 y.o.   MRN: [id number]  HPI  Patient arrives with blood in his stool off and on since Thursday.  Patient denies abdominal pain.  He states the spells he was having in his back that would cause his low back to become very painful then go away has actually gone away the past couple weeks.  Please see previous documentation plus also see ultrasound Results for orders placed or performed in visit on 05/03/20  POCT hemoglobin  Result Value Ref Range   Hemoglobin 11.8 11 - 14.6 g/dL     Review of Systems Patient denies abdominal pain after eating denies vomiting states his bowels have been fine but off and on since Thursday he is noted bright red blood small amounts several different times    Objective:   Physical Exam Hemodynamically he is stable blood pressure good lungs clear heart regular abdomen is soft no guarding or rebound hemoglobin good it is lower than normal but it is not critically low       Assessment & Plan:  Rectal bleeding Patient would benefit from gastroenterology evaluation Also believe the patient would benefit from colonoscopy.  He is due next year but with this turn of events obviously he needs it now  Given that he is not having any abdominal tenderness or discomfort I would not recommend CAT scan at this moment I did talk with gastroenterology office they are able to see him on Tuesday afternoon patient was told about the appointment  Patient was counseled that if he has a large amount of blood in the stool or worse he is to immediately go to the ER

## 2020-05-04 ENCOUNTER — Encounter: Payer: Self-pay | Admitting: Internal Medicine

## 2020-05-04 ENCOUNTER — Encounter: Payer: Self-pay | Admitting: Nurse Practitioner

## 2020-05-04 ENCOUNTER — Encounter: Payer: Self-pay | Admitting: *Deleted

## 2020-05-04 ENCOUNTER — Telehealth (INDEPENDENT_AMBULATORY_CARE_PROVIDER_SITE_OTHER): Payer: Medicare Other | Admitting: Nurse Practitioner

## 2020-05-04 DIAGNOSIS — K625 Hemorrhage of anus and rectum: Secondary | ICD-10-CM

## 2020-05-04 NOTE — Progress Notes (Signed)
error, no-show This encounter was created in error - please disregard.

## 2020-05-04 NOTE — Patient Instructions (Signed)
Your health issues we discussed today were:   Rectal bleeding: 1. As we discussed, we will schedule colonoscopy for you with Dr. Abbey Chatters 2. Further recommendations will follow your colonoscopy 3. Call us for any worsening bleeding or signs of significant blood loss such as weakness, fatigue, dizziness, lightheadedness, passing out, nearly passing out. 4. If you have severe symptoms you can proceed to the emergency room  Overall I recommend:  1. Continue other current medications 2. Return for follow-up in 2 months 3. Call us for any questions or concerns   ---------------------------------------------------------------  I am glad you have gotten your COVID-19 vaccination!  Even though you are fully vaccinated you should continue to follow CDC and state/local guidelines.  ---------------------------------------------------------------   At [facility] we value your feedback. You may receive a survey about your visit today. Please share your experience as we strive to create trusting relationships with our patients to provide genuine, compassionate, quality care.  We appreciate your understanding and patience as we review any laboratory studies, imaging, and other diagnostic tests that are ordered as we care for you. Our office policy is 5 business days for review of these results, and any emergent or urgent results are addressed in a timely manner for your best interest. If you do not hear from our office in 1 week, please contact us.   We also encourage the use of MyChart, which contains your medical information for your review as well. If you are not enrolled in this feature, an access code is on this after visit summary for your convenience. Thank you for allowing [place] to be involved in your care.  It was great to see you today!  I hope you have a great end of winter and early spring!!

## 2020-05-04 NOTE — Progress Notes (Signed)
Cc'ed to pcp °

## 2020-05-04 NOTE — Progress Notes (Signed)
Referring Provider: Kathyrn Drown, MD Primary Care Physician:  Kathyrn Drown, MD Primary GI:  Dr. Abbey Chatters  Chief Complaint  Patient presents with  . Blood In Stools    Started Thursday, had lot of blood yesterday, no bleeding so far today    HPI:   Anthony Little is a 70 y.o. male who presents on referral from primary care for blood in the stool.  Patient is often seen in our office since 2017.  The patient saw primary care yesterday for blood in the stool when he noted on and off blood in his stool since Thursday (approximately 4 days) with no abdominal pain.  Hemoglobin found to be normal yesterday at 11.8.  Recommended GI evaluation and possible colonoscopy as he is due next year.  ER precautions were given as well.  Previous colonoscopy completed 11/01/2015 for high risk colon cancer surveillance personal history of colon polyps.  His exam found for sessile polyps ranging 2 to 4 mm in size status post removal, few small and large mouth diverticula, nonbleeding internal hemorrhoids.  Surgical pathology found the polyps to be tubular adenoma.  EGD at the same time found erosive gastritis and duodenitis with duodenal ulcer status post biopsy.  Surgical pathology found biopsies to be chronic inactive gastritis.  Recommended avoid all NSAIDs for 7 days, Prilosec twice daily for 3 months then once daily forever, repeat colonoscopy in 5 years (2022).  Today states doing okay overall. He states he's had rectal bleeding since Thursday. He hnoted a lot of blood yesterday, but none so far today despite two bowel movement. Blood was bright red. No known hemorrhoids. Denies abdominal pain, N/V, melena. Has had some increased bloating. Has intermittent lower back pain. Denies family history of CRC. Since the bleeding has started he notes his stools have been a little softer, but no significant diarrhea or watery stools. Denies fever, chills, unintentional weight loss. Denies URI or flu-like  symptoms. Denies loss of sense of taste or smell. The patient has received COVID-19 vaccination(s). Denies chest pain, dyspnea, dizziness, lightheadedness, syncope, near syncope. Denies any other upper or lower GI symptoms.  Has had some more fatigue over the past 8-9 months, no worsening recently. Denies other symptoms of worsening anemia (chest pain, dyspnea, syncope, near syncope).  Past Medical History:  Diagnosis Date  . CVA (cerebral infarction)   . Essential hypertension   . Hyperlipidemia     Past Surgical History:  Procedure Laterality Date  . CARDIOVASCULAR STRESS TEST  2002   negative  . COLONOSCOPY  12/2009   Diverticulosis, mild internal hemorrhoids, multiple colon polyps removed (adenomatous). One irregular shaped polyp was tattooed. Next colonoscopy due October 2016.  Marland Kitchen COLONOSCOPY N/A 11/01/2015   Procedure: COLONOSCOPY;  Surgeon: Danie Binder, MD;  Location: [facility];  Service: Endoscopy;  Laterality: N/A;  100  . ESOPHAGOGASTRODUODENOSCOPY N/A 10/08/2012   SLF: Moderate erosive gastritis/duodenitis. Biopsies benign.  . ESOPHAGOGASTRODUODENOSCOPY N/A 11/01/2015   Procedure: ESOPHAGOGASTRODUODENOSCOPY (EGD);  Surgeon: Danie Binder, MD;  Location: [facility];  Service: Endoscopy;  Laterality: N/A;    Current Outpatient Medications  Medication Sig Dispense Refill  . amLODipine (NORVASC) 5 MG tablet Take 1 tablet (5 mg total) by mouth daily. 90 tablet 1  . aspirin EC 81 MG tablet Take 81 mg by mouth daily.    Marland Kitchen atorvastatin (LIPITOR) 40 MG tablet Take one tablet po daily 90 tablet 1  . buPROPion (WELLBUTRIN XL) 150 MG 24 hr  tablet TAKE 1 TABLET BY MOUTH EVERY DAY 90 tablet 2  . halobetasol (ULTRAVATE) 0.05 % cream Apply a thin amount bid prn 30 g 0  . hydrochlorothiazide (MICROZIDE) 12.5 MG capsule TAKE 1 CAPSULE BY MOUTH EVERY DAY IN THE MORNING 90 capsule 0  . potassium chloride (KLOR-CON) 10 MEQ tablet TAKE 1 TABLET BY MOUTH IN THE MORNING (Patient taking  differently: Twice a week) 90 tablet 0  . triamcinolone cream (KENALOG) 0.1 % Apply twice daily PRN 30 g 0   No current facility-administered medications for this visit.    Allergies as of 05/04/2020  . (No Known Allergies)    Family History  Problem Relation Age of Onset  . Diabetes Mother   . Diabetes Maternal Grandfather   . Parkinson's disease Father   . Ulcers Neg Hx   . Colon cancer Neg Hx     Social History   Socioeconomic History  . Marital status: Married    Spouse name: Not on file  . Number of children: Not on file  . Years of education: Not on file  . Highest education level: Not on file  Occupational History  . Occupation: Secondary school teacher  Tobacco Use  . Smoking status: Current Every Day Smoker    Packs/day: 0.15    Types: Cigarettes    Start date: 10/04/2015  . Smokeless tobacco: Former Systems developer    Quit date: 01/07/2015  . Tobacco comment: he would like to but he knows it is hard   Substance and Sexual Activity  . Alcohol use: Yes    Alcohol/week: 0.0 standard drinks    Comment: liquor 5 ounces per night, long-time  . Drug use: No  . Sexual activity: Never  Other Topics Concern  . Not on file  Social History Narrative  . Not on file   Social Determinants of Health   Financial Resource Strain: Not on file  Food Insecurity: Not on file  Transportation Needs: Not on file  Physical Activity: Not on file  Stress: Not on file  Social Connections: Not on file    Subjective: Review of Systems  Constitutional: Negative for chills, fever, malaise/fatigue and weight loss.  HENT: Negative for congestion and sore throat.   Respiratory: Negative for cough and shortness of breath.   Cardiovascular: Negative for chest pain and palpitations.  Gastrointestinal: Positive for blood in stool. Negative for abdominal pain, constipation, diarrhea, heartburn, melena, nausea and vomiting.  Musculoskeletal: Negative for joint pain and myalgias.  Skin: Negative for  rash.  Neurological: Negative for dizziness and weakness.  Endo/Heme/Allergies: Does not bruise/bleed easily.  Psychiatric/Behavioral: Negative for depression. The patient is not nervous/anxious.   All other systems reviewed and are negative.    Objective: BP (!) 155/99   Pulse (!) 103   Temp (!) 97.5 F (36.4 C)   Ht _0  (1.778 m)   Wt 196 lb 9.6 oz (89.2 kg)   BMI 28.21 kg/m  Physical Exam Vitals and nursing note reviewed.  Constitutional:      General: He is not in acute distress.    Appearance: Normal appearance. He is normal weight. He is not ill-appearing, toxic-appearing or diaphoretic.  HENT:     Head: Normocephalic and atraumatic.     Nose: No congestion or rhinorrhea.  Eyes:     General: No scleral icterus. Cardiovascular:     Rate and Rhythm: Normal rate and regular rhythm.     Heart sounds: Normal heart sounds.  Pulmonary:  Effort: Pulmonary effort is normal.     Breath sounds: Normal breath sounds.  Abdominal:     General: Bowel sounds are normal. There is no distension.     Palpations: Abdomen is soft. There is no hepatomegaly, splenomegaly or mass.     Tenderness: There is no abdominal tenderness. There is no guarding or rebound.     Hernia: No hernia is present.  Musculoskeletal:     Cervical back: Neck supple.  Skin:    General: Skin is warm and dry.     Coloration: Skin is not jaundiced.     Findings: No bruising or rash.  Neurological:     General: No focal deficit present.     Mental Status: He is alert and oriented to person, place, and time. Mental status is at baseline.  Psychiatric:        Mood and Affect: Mood normal.        Behavior: Behavior normal.        Thought Content: Thought content normal.      Assessment:  Very pleasant 70 year old male presents for urgent work in for rectal bleeding.  He notes he has had large amounts of rectal bleeding since Thursday with 1 or 2 days without rectal bleeding.  Today he has not had any  bleeding despite 2 bowel movements.  He has never had bleeding like this before.  No associated weight loss, bowel habit changes, other red flag/warning signs or symptoms.  Hemoglobin was checked yesterday and still remains normal which is reassuring.  Review of his last colonoscopy 5 years ago found nonbleeding internal hemorrhoids and diverticula.  Differentials include benign anorectal source such as internal hemorrhoids, fissures.  Diverticular bleed is also possibility as well given the "large amount" of bleeding he was noted.  Regardless, he would have been due for his colonoscopy this year anyway.  We will proceed with more urgent scheduling.  I have asked him to call us for any worsening bleeding.  We can always recheck a CBC depending on his clinical progression.  Proceed with TCS on propofol/MAC with Dr. Abbey Chatters on propofol/MAC in near future: the risks, benefits, and alternatives have been discussed with the patient in detail. The patient states understanding and desires to proceed.  ASA 2   Plan: 1. Colonoscopy as described above 2. Call for any worsening bleeding or severe symptoms 3. If "scary symptoms" can proceed to the emergency room 4. Follow-up in 2 months    Thank you for allowing [place] to participate in the care of [place], DNP, AGNP-C Adult & Gerontological Nurse Practitioner [facility]   05/04/2020 3:42 PM   Disclaimer: This note was dictated with voice recognition software. Similar sounding words can inadvertently be transcribed and may not be corrected upon review.

## 2020-05-05 ENCOUNTER — Other Ambulatory Visit (HOSPITAL_COMMUNITY)
Admission: RE | Admit: 2020-05-05 | Discharge: 2020-05-05 | Disposition: A | Discharge status: Home / Self Care | Payer: Medicare Other | Source: Ambulatory Visit | Attending: Internal Medicine | Admitting: Internal Medicine

## 2020-05-05 ENCOUNTER — Other Ambulatory Visit: Payer: Self-pay

## 2020-05-05 DIAGNOSIS — U071 COVID-19: Secondary | ICD-10-CM | POA: Insufficient documentation

## 2020-05-05 DIAGNOSIS — Z01812 Encounter for preprocedural laboratory examination: Secondary | ICD-10-CM | POA: Insufficient documentation

## 2020-05-05 LAB — SARS CORONAVIRUS 2 (TAT 6-24 HRS): SARS Coronavirus 2: POSITIVE — AB

## 2020-05-06 ENCOUNTER — Telehealth: Payer: Self-pay

## 2020-05-06 ENCOUNTER — Telehealth: Payer: Self-pay | Admitting: *Deleted

## 2020-05-06 NOTE — Telephone Encounter (Signed)
Spoke with patient and he is aware of positive covid test. He has been rescheduled to 3/11 am appt. Aware will remail instructions and he won't be retested for covid. He voiced understanding. Endo aware of new appt date

## 2020-05-06 NOTE — Telephone Encounter (Signed)
Called to discuss with patient about COVID-19 symptoms and the use of one of the available treatments for those with mild to moderate Covid symptoms and at a high risk of hospitalization.  Pt appears to qualify for outpatient treatment due to co-morbid conditions and/or a member of an at-risk group in accordance with the FDA Emergency Use Authorization.    Symptom onset: None Vaccinated: Yes Booster? No Immunocompromised? No Qualifiers: CAD,HTN  Pt. Reports he has no symptoms. "I feel great."  Anthony Little

## 2020-05-06 NOTE — Telephone Encounter (Signed)
Received call from endo patient was covid +. He can be rescheduled 10 days from now  Called pt, LMOVM to call back

## 2020-05-12 ENCOUNTER — Telehealth: Payer: Self-pay

## 2020-05-12 NOTE — Telephone Encounter (Signed)
LMOVM for pt to return call. He was covid positive 2/23 and had to be scheduled out 10 days and that's as long patient is not having any symptoms. He is on for 3/11 (next Friday)

## 2020-05-12 NOTE — Telephone Encounter (Signed)
Spoke with pt. Pt would like to see if his apt for his TCS could be moved up. Pt states his procedure was r/s due to him testing positive of Covid. Pt states he has had no symptoms and took an at home test that read negative. Pt doesn't want to wait until 05/21/2020.

## 2020-05-20 ENCOUNTER — Other Ambulatory Visit (HOSPITAL_COMMUNITY)
Admission: RE | Admit: 2020-05-20 | Discharge: 2020-05-20 | Disposition: A | Discharge status: Home / Self Care | Payer: Medicare Other | Source: Ambulatory Visit | Attending: Gastroenterology | Admitting: Gastroenterology

## 2020-05-20 ENCOUNTER — Other Ambulatory Visit: Payer: Self-pay

## 2020-05-20 ENCOUNTER — Encounter (HOSPITAL_COMMUNITY): Payer: Self-pay | Admitting: Anesthesiology

## 2020-05-20 DIAGNOSIS — J439 Emphysema, unspecified: Secondary | ICD-10-CM | POA: Diagnosis not present

## 2020-05-20 DIAGNOSIS — Z7982 Long term (current) use of aspirin: Secondary | ICD-10-CM | POA: Diagnosis not present

## 2020-05-20 DIAGNOSIS — E785 Hyperlipidemia, unspecified: Secondary | ICD-10-CM | POA: Diagnosis not present

## 2020-05-20 DIAGNOSIS — I4892 Unspecified atrial flutter: Secondary | ICD-10-CM | POA: Diagnosis not present

## 2020-05-20 DIAGNOSIS — Z8673 Personal history of transient ischemic attack (TIA), and cerebral infarction without residual deficits: Secondary | ICD-10-CM | POA: Diagnosis not present

## 2020-05-20 DIAGNOSIS — I4891 Unspecified atrial fibrillation: Secondary | ICD-10-CM | POA: Diagnosis not present

## 2020-05-20 DIAGNOSIS — Z79899 Other long term (current) drug therapy: Secondary | ICD-10-CM | POA: Diagnosis not present

## 2020-05-20 DIAGNOSIS — I10 Essential (primary) hypertension: Secondary | ICD-10-CM | POA: Diagnosis not present

## 2020-05-20 DIAGNOSIS — F17210 Nicotine dependence, cigarettes, uncomplicated: Secondary | ICD-10-CM | POA: Diagnosis not present

## 2020-05-20 DIAGNOSIS — K625 Hemorrhage of anus and rectum: Secondary | ICD-10-CM | POA: Diagnosis not present

## 2020-05-20 DIAGNOSIS — Z538 Procedure and treatment not carried out for other reasons: Secondary | ICD-10-CM | POA: Diagnosis not present

## 2020-05-20 LAB — BASIC METABOLIC PANEL
Anion gap: 11 (ref 5–15)
BUN: 33 mg/dL — ABNORMAL HIGH (ref 8–23)
CO2: 24 mmol/L (ref 22–32)
Calcium: 9.6 mg/dL (ref 8.9–10.3)
Chloride: 102 mmol/L (ref 98–111)
Creatinine, Ser: 1.79 mg/dL — ABNORMAL HIGH (ref 0.61–1.24)
GFR, Estimated: 41 mL/min — ABNORMAL LOW (ref >60)
Glucose, Bld: 100 mg/dL — ABNORMAL HIGH (ref 70–99)
Potassium: 5 mmol/L (ref 3.5–5.1)
Sodium: 137 mmol/L (ref 135–145)

## 2020-05-21 ENCOUNTER — Encounter (HOSPITAL_COMMUNITY): Payer: Self-pay | Admitting: Emergency Medicine

## 2020-05-21 ENCOUNTER — Encounter (HOSPITAL_COMMUNITY): Payer: Self-pay | Admitting: *Deleted

## 2020-05-21 ENCOUNTER — Emergency Department (HOSPITAL_COMMUNITY): Payer: Medicare Other

## 2020-05-21 ENCOUNTER — Ambulatory Visit (HOSPITAL_COMMUNITY)
Admission: RE | Admit: 2020-05-21 | Discharge: 2020-05-21 | Disposition: A | Discharge status: Still In Hospital | Payer: Medicare Other | Attending: Internal Medicine | Admitting: Internal Medicine

## 2020-05-21 ENCOUNTER — Other Ambulatory Visit: Payer: Self-pay

## 2020-05-21 ENCOUNTER — Encounter (HOSPITAL_COMMUNITY)
Admission: RE | Disposition: A | Discharge status: Still In Hospital | Payer: Self-pay | Source: Home / Self Care | Attending: Internal Medicine

## 2020-05-21 ENCOUNTER — Emergency Department (HOSPITAL_COMMUNITY)
Admission: EM | Admit: 2020-05-21 | Discharge: 2020-05-21 | Disposition: A | Discharge status: Home / Self Care | Payer: Medicare Other | Source: Home / Self Care | Attending: Emergency Medicine | Admitting: Emergency Medicine

## 2020-05-21 DIAGNOSIS — F17210 Nicotine dependence, cigarettes, uncomplicated: Secondary | ICD-10-CM | POA: Insufficient documentation

## 2020-05-21 DIAGNOSIS — Z538 Procedure and treatment not carried out for other reasons: Secondary | ICD-10-CM | POA: Diagnosis not present

## 2020-05-21 DIAGNOSIS — I2510 Atherosclerotic heart disease of native coronary artery without angina pectoris: Secondary | ICD-10-CM | POA: Insufficient documentation

## 2020-05-21 DIAGNOSIS — I10 Essential (primary) hypertension: Secondary | ICD-10-CM | POA: Insufficient documentation

## 2020-05-21 DIAGNOSIS — I4892 Unspecified atrial flutter: Secondary | ICD-10-CM | POA: Insufficient documentation

## 2020-05-21 DIAGNOSIS — E785 Hyperlipidemia, unspecified: Secondary | ICD-10-CM | POA: Insufficient documentation

## 2020-05-21 DIAGNOSIS — I4891 Unspecified atrial fibrillation: Secondary | ICD-10-CM | POA: Diagnosis not present

## 2020-05-21 DIAGNOSIS — J439 Emphysema, unspecified: Secondary | ICD-10-CM | POA: Diagnosis not present

## 2020-05-21 DIAGNOSIS — Z79899 Other long term (current) drug therapy: Secondary | ICD-10-CM | POA: Diagnosis not present

## 2020-05-21 DIAGNOSIS — Z8673 Personal history of transient ischemic attack (TIA), and cerebral infarction without residual deficits: Secondary | ICD-10-CM | POA: Insufficient documentation

## 2020-05-21 DIAGNOSIS — Z7982 Long term (current) use of aspirin: Secondary | ICD-10-CM | POA: Insufficient documentation

## 2020-05-21 DIAGNOSIS — K625 Hemorrhage of anus and rectum: Secondary | ICD-10-CM | POA: Diagnosis not present

## 2020-05-21 LAB — CBC WITH DIFFERENTIAL/PLATELET
Abs Immature Granulocytes: 0.03 10*3/uL (ref 0.00–0.07)
Basophils Absolute: 0.1 10*3/uL (ref 0.0–0.1)
Basophils Relative: 1 %
Eosinophils Absolute: 0.2 10*3/uL (ref 0.0–0.5)
Eosinophils Relative: 2 %
HCT: 38.3 % — ABNORMAL LOW (ref 39.0–52.0)
Hemoglobin: 12.8 g/dL — ABNORMAL LOW (ref 13.0–17.0)
Immature Granulocytes: 0 %
Lymphocytes Relative: 13 %
Lymphs Abs: 1.1 10*3/uL (ref 0.7–4.0)
MCH: 35.3 pg — ABNORMAL HIGH (ref 26.0–34.0)
MCHC: 33.4 g/dL (ref 30.0–36.0)
MCV: 105.5 fL — ABNORMAL HIGH (ref 80.0–100.0)
Monocytes Absolute: 0.5 10*3/uL (ref 0.1–1.0)
Monocytes Relative: 7 %
Neutro Abs: 6.2 10*3/uL (ref 1.7–7.7)
Neutrophils Relative %: 77 %
Platelets: 185 10*3/uL (ref 150–400)
RBC: 3.63 MIL/uL — ABNORMAL LOW (ref 4.22–5.81)
RDW: 13.4 % (ref 11.5–15.5)
WBC: 8 10*3/uL (ref 4.0–10.5)
nRBC: 0 % (ref 0.0–0.2)

## 2020-05-21 LAB — COMPREHENSIVE METABOLIC PANEL
ALT: 47 U/L — ABNORMAL HIGH (ref 0–44)
AST: 106 U/L — ABNORMAL HIGH (ref 15–41)
Albumin: 3.9 g/dL (ref 3.5–5.0)
Alkaline Phosphatase: 72 U/L (ref 38–126)
Anion gap: 11 (ref 5–15)
BUN: 21 mg/dL (ref 8–23)
CO2: 23 mmol/L (ref 22–32)
Calcium: 8.7 mg/dL — ABNORMAL LOW (ref 8.9–10.3)
Chloride: 101 mmol/L (ref 98–111)
Creatinine, Ser: 1.39 mg/dL — ABNORMAL HIGH (ref 0.61–1.24)
GFR, Estimated: 55 mL/min — ABNORMAL LOW (ref >60)
Glucose, Bld: 76 mg/dL (ref 70–99)
Potassium: 4.3 mmol/L (ref 3.5–5.1)
Sodium: 135 mmol/L (ref 135–145)
Total Bilirubin: 1.3 mg/dL — ABNORMAL HIGH (ref 0.3–1.2)
Total Protein: 6.9 g/dL (ref 6.5–8.1)

## 2020-05-21 SURGERY — Surgical Case

## 2020-05-21 MED ORDER — METOPROLOL SUCCINATE ER 25 MG PO TB24
25.0000 mg | ORAL_TABLET | Freq: Every day | ORAL | Status: DC
  Administered 2020-05-21: 25 mg via ORAL
  Filled 2020-05-21: qty 1

## 2020-05-21 MED ORDER — LACTATED RINGERS IV SOLN
INTRAVENOUS | Status: DC
  Administered 2020-05-21: 1000 mL via INTRAVENOUS

## 2020-05-21 MED ORDER — METOPROLOL SUCCINATE ER 25 MG PO TB24: 25.0000 mg | ORAL_TABLET | Freq: Every day | ORAL | 0 refills | Status: DC

## 2020-05-21 NOTE — Discharge Instructions (Signed)
Get back in contact with your GI doctor.  Cardiology says we need to go forward with the colonoscopy so that they can determine if you can have anticoagulation therapy.  Also call cardiology for follow-up on Monday.  Make an appointment to see Dr.Luken later in the week to recheck your heart rhythm.  Take the Toprol XL 25 mg daily as directed.  Return for any new or worse symptoms.

## 2020-05-21 NOTE — Progress Notes (Signed)
Patient in for a colonoscopy. Patient's heart rate 101-150. Presents with new onset of Atrial flutter. Procedure  Is cancelled for today. Patient transported to the ED via stretcher.

## 2020-05-21 NOTE — ED Triage Notes (Signed)
Pt sent over from ENDO. Pt had colonoscopy scheduled for today, when arrived to endo, pt was in new onset of a-flutter and sent over for eval.

## 2020-05-21 NOTE — H&P (Signed)
Patient presented today for outpatient colonoscopy due to rectal bleeding.  During preoperative work-up, patient was found to be in and out of new onset atrial flutter which was documented on EKG.  We will cancel his procedure for today and send to the ER for further evaluation.  If patient is admitted to the hospital, we can proceed with colonoscopy tomorrow.  Case discussed with Dr. Jenetta Downer who will be covering the GI service over the weekend.

## 2020-05-21 NOTE — ED Notes (Signed)

## 2020-05-21 NOTE — ED Provider Notes (Signed)
[facility] Provider Note   CSN: [id number] Arrival date & time: 05/21/20  1052     History Chief Complaint  Patient presents with  . Irregular Heart Beat    Anthony Little is a 70 y.o. male.  Patient sent in from endoscopy suite.  Patient was to have a colonoscopy today.  For GI bleed problems.  I noted his heart rate was 140s.  Seem to be consistent with atrial fib or atrial flutter.  Patient does not have a history of that.  Past medical history is significant for CVA essential hypertension hyperlipidemia.  Patient referred here for evaluation.  Upon arrival here.  Heart rate had improved significantly it was more in the low 100s.  EKG read by the computer as atrial fib but rhythm probably flutter.  Patient denies any chest pain or shortness of breath.  Was not aware that he had a rhythm problem.  Never been told in the past that he had an arrhythmia.        Past Medical History:  Diagnosis Date  . CVA (cerebral infarction)   . Essential hypertension   . Hyperlipidemia     Patient Active Problem List   Diagnosis Date Noted  . Purpura senilis ([place]) 03/18/2020  . Coronary artery disease involving native heart without angina pectoris 07/14/2019  . Pulmonary nodule 07/14/2019  . Ectatic thoracic aorta ([place]) 07/14/2019  . Thoracic aorta atherosclerosis ([place]) 03/07/2016  . Hyperlipidemia 01/14/2016  . History of colonic polyps 10/21/2015  . HTN (hypertension) 06/08/2014  . Brain aneurysm 09/04/2013  . Alcohol abuse 09/04/2013  . Early satiety 10/02/2012  . Abnormal weight loss 10/02/2012  . Abdominal pain, chronic, right upper quadrant 10/02/2012    Past Surgical History:  Procedure Laterality Date  . CARDIOVASCULAR STRESS TEST  2002   negative  . COLONOSCOPY  12/2009   Diverticulosis, mild internal hemorrhoids, multiple colon polyps removed (adenomatous). One irregular shaped polyp was tattooed. Next colonoscopy due October 2016.  Marland Kitchen  COLONOSCOPY N/A 11/01/2015   Procedure: COLONOSCOPY;  Surgeon: Danie Binder, MD;  Location: [facility];  Service: Endoscopy;  Laterality: N/A;  100  . ESOPHAGOGASTRODUODENOSCOPY N/A 10/08/2012   SLF: Moderate erosive gastritis/duodenitis. Biopsies benign.  . ESOPHAGOGASTRODUODENOSCOPY N/A 11/01/2015   Procedure: ESOPHAGOGASTRODUODENOSCOPY (EGD);  Surgeon: Danie Binder, MD;  Location: [facility];  Service: Endoscopy;  Laterality: N/A;       Family History  Problem Relation Age of Onset  . Diabetes Mother   . Diabetes Maternal Grandfather   . Parkinson's disease Father   . Ulcers Neg Hx   . Colon cancer Neg Hx     Social History   Tobacco Use  . Smoking status: Current Every Day Smoker    Packs/day: 0.15    Types: Cigarettes    Start date: 10/04/2015  . Smokeless tobacco: Never Used  . Tobacco comment: he would like to but he knows it is hard   Vaping Use  . Vaping Use: Never used  Substance Use Topics  . Alcohol use: Yes    Alcohol/week: 0.0 standard drinks    Comment: liquor 5 ounces per night, long-time  . Drug use: No    Home Medications Prior to Admission medications   Medication Sig Start Date End Date Taking? Authorizing Provider  amLODipine (NORVASC) 5 MG tablet Take 1 tablet (5 mg total) by mouth daily. 03/18/20   Kathyrn Drown, MD  aspirin EC 81 MG tablet Take 81 mg by  mouth daily.    [provider]  atorvastatin (LIPITOR) 40 MG tablet Take one tablet po daily Patient taking differently: Take 40 mg by mouth daily. Take one tablet po daily 03/18/20   Sallee Lange A, MD  buPROPion (WELLBUTRIN XL) 150 MG 24 hr tablet TAKE 1 TABLET BY MOUTH EVERY DAY Patient taking differently: Take 150 mg by mouth daily. 04/12/20   Kathyrn Drown, MD  hydrochlorothiazide (MICROZIDE) 12.5 MG capsule TAKE 1 CAPSULE BY MOUTH EVERY DAY IN THE MORNING Patient taking differently: Take 12.5 mg by mouth daily. 04/21/20   Kathyrn Drown, MD  potassium chloride (KLOR-CON) 10  MEQ tablet TAKE 1 TABLET BY MOUTH IN THE MORNING Patient taking differently: Take 10 mEq by mouth 2 (two) times a week. Twice a week 01/19/20   Kathyrn Drown, MD  triamcinolone cream (KENALOG) 0.1 % Apply twice daily PRN Patient taking differently: Apply 1 application topically 2 (two) times daily as needed (irritation). Apply twice daily PRN 08/09/18   Kathyrn Drown, MD    Allergies    Patient has no known allergies.  Review of Systems   Review of Systems  Constitutional: Negative for chills and fever.  HENT: Negative for congestion, rhinorrhea and sore throat.   Eyes: Negative for visual disturbance.  Respiratory: Negative for cough and shortness of breath.   Cardiovascular: Negative for chest pain, palpitations and leg swelling.  Gastrointestinal: Negative for abdominal pain, diarrhea, nausea and vomiting.  Genitourinary: Negative for dysuria.  Musculoskeletal: Negative for back pain and neck pain.  Skin: Negative for rash.  Neurological: Negative for dizziness, light-headedness and headaches.  Hematological: Does not bruise/bleed easily.  Psychiatric/Behavioral: Negative for confusion.    Physical Exam Updated Vital Signs Ht 1.778 m (5\' 10" )   Wt 88.5 kg   BMI 27.98 kg/m   Physical Exam Vitals and nursing note reviewed.  Constitutional:      Appearance: Normal appearance. He is well-developed.  HENT:     Head: Normocephalic and atraumatic.  Eyes:     Extraocular Movements: Extraocular movements intact.     Conjunctiva/sclera: Conjunctivae normal.     Pupils: Pupils are equal, round, and reactive to light.  Cardiovascular:     Rate and Rhythm: Regular rhythm. Tachycardia present.     Heart sounds: No murmur heard.   Pulmonary:     Effort: Pulmonary effort is normal. No respiratory distress.     Breath sounds: Normal breath sounds. No wheezing or rales.  Abdominal:     Palpations: Abdomen is soft.     Tenderness: There is no abdominal tenderness.   Musculoskeletal:        General: No swelling.     Cervical back: Neck supple.  Skin:    General: Skin is warm and dry.  Neurological:     General: No focal deficit present.     Mental Status: He is alert. Mental status is at baseline.     Cranial Nerves: No cranial nerve deficit.     ED Results / Procedures / Treatments   Labs (all labs ordered are listed, but only abnormal results are displayed) Labs Reviewed - No data to display  EKG EKG Interpretation  Date/Time:  Friday May 21 2020 11:03:55 EST Ventricular Rate:  96 PR Interval:    QRS Duration: 103 QT Interval:  366 QTC Calculation: 458 R Axis:   78 Text Interpretation: Atrial fibrillation Ventricular premature complex Minimal ST depression, inferior leads No significant change since last tracing Confirmed  by Fredia Sorrow [phone]) on 05/21/2020 11:12:36 AM   Radiology No results found.  Procedures Procedures   Medications Ordered in ED Medications - No data to display  ED Course  I have reviewed the triage vital signs and the nursing notes.  Pertinent labs & imaging results that were available during my care of the patient were reviewed by me and considered in my medical decision making (see chart for details).    MDM Rules/Calculators/A&P                          Work-up for the new atrial flutter.  Electrolytes without significant abnormalities.  Patient's heart rate remained kind of in the 90s to low 100s here.  Chest x-ray negative.  Globin 12.8.  No leukocytosis.  With on-call cardiology Dr. Domenic Polite.  He recommended Toprol XL 25 mg first dose given here.  He will continue that at home.  They will follow him up.  Patient will also follow-up with his primary care doctor for recheck in about a week.  And also patient will get back in touch with his GI doctor so they can complete the colonoscopy so that cardiology knows whether patient can be anticoagulated safely or not.  Patient understands the plan.   Patient nontoxic no acute distress   Final Clinical Impression(s) / ED Diagnoses Final diagnoses:  None    Rx / DC Orders ED Discharge Orders    None       Fredia Sorrow, MD 05/21/20 1547

## 2020-05-23 ENCOUNTER — Telehealth: Payer: Self-pay | Admitting: Family Medicine

## 2020-05-23 NOTE — Telephone Encounter (Signed)
Nurses Patient recently in ER due to atrial flutter Please reach out to him to set up a follow-up visit within 10 days with me

## 2020-05-24 ENCOUNTER — Telehealth: Payer: Self-pay

## 2020-05-24 NOTE — Telephone Encounter (Signed)
Called and informed pt of Dr. Ave Filter recommendation.

## 2020-05-24 NOTE — Telephone Encounter (Signed)
Pt called office and LMOVM, his TCS was cancelled 05/21/20 d/t abnormal heart rate. He was seen in ED. He wants to know about rescheduling procedure.  Dr. Abbey Chatters, see ED note. Can TCS be rescheduled or does he need further work-up?

## 2020-05-24 NOTE — Telephone Encounter (Signed)
Called and transferred pt to the front to schedule hospital follow up within 10 days. Pt has no concerns at this time.

## 2020-05-24 NOTE — Telephone Encounter (Signed)
Looks like he is following up with cardiology.  Please tell patient to keep his appoint with Randall Hiss in April and we can discuss rescheduling at that time once he has been fully evaluated by cardiology.  He may need echocardiogram.  Thank you

## 2020-05-25 ENCOUNTER — Telehealth: Payer: Self-pay | Admitting: Internal Medicine

## 2020-05-25 DIAGNOSIS — I499 Cardiac arrhythmia, unspecified: Secondary | ICD-10-CM

## 2020-05-25 NOTE — Telephone Encounter (Signed)
Yes - please do - thanks

## 2020-05-25 NOTE — Addendum Note (Signed)
Addended by: Hassan Rowan on: 05/25/2020 02:40 PM   Modules accepted: Orders

## 2020-05-25 NOTE — Telephone Encounter (Signed)
Referral sent to cardiology via Epic.  Called and informed pt referral was sent to cardiology.

## 2020-05-25 NOTE — Telephone Encounter (Signed)
Dr. Abbey Chatters, is ok to send referral to cardiology? Procedure was cancelled d/t abnormal heart rate and he was seen in ED.

## 2020-05-25 NOTE — Telephone Encounter (Signed)
PATIENT CALLED AND SAID THEY COULD NOT DO HIS PROCEDURE DUE TO HIS HEART, TOLD HIM TO SEE A CARDIOLOGIST, PATIENT ASKED TO PLEASE SEND A REFERRAL TO ONE

## 2020-06-02 ENCOUNTER — Other Ambulatory Visit: Payer: Self-pay

## 2020-06-02 ENCOUNTER — Encounter: Payer: Self-pay | Admitting: Family Medicine

## 2020-06-02 ENCOUNTER — Ambulatory Visit (INDEPENDENT_AMBULATORY_CARE_PROVIDER_SITE_OTHER): Payer: Medicare Other | Admitting: Family Medicine

## 2020-06-02 VITALS — BP 130/86 | HR 106 | Temp 97.1°F | Ht 70.0 in | Wt 192.0 lb

## 2020-06-02 DIAGNOSIS — K921 Melena: Secondary | ICD-10-CM | POA: Diagnosis not present

## 2020-06-02 DIAGNOSIS — I4821 Permanent atrial fibrillation: Secondary | ICD-10-CM | POA: Diagnosis not present

## 2020-06-02 DIAGNOSIS — R7401 Elevation of levels of liver transaminase levels: Secondary | ICD-10-CM | POA: Diagnosis not present

## 2020-06-02 NOTE — Progress Notes (Signed)
   Subjective:    Patient ID: Anthony Little, male    DOB: 1950-06-29, 70 y.o.   MRN: [id number]  HPI hospital follow up for Afib. Needs referral for colonoscopy. Has appt on 4/23 with Anthony Little for rectal bleeding. Pt states that was suppose to be his follow up appt after his colonoscopy. His colonscopy has gotten canceled twice once for covid and then for abnormal heart rate.  Patient recently in the hospital with atrial flutter. This developed while he was at the GI for colonoscopy GI states they will not do the colonoscopy until he has further work-up Currently now in atrial fib Denies chest tightness shortness of breath   Review of Systems See above    Objective:   Physical Exam  Rate is controlled but heart is irregular previous EKGs reviewed reveal atrial flutter as well as atrial fib CHADS2 score indicates the need for anticoagulation Still dealing with work-up for rectal bleeding until this work-up is complete he cannot start Eliquis.      Assessment & Plan:  Atrial fib Atrial flutter Flutter is resolved CHADS2 score indicates the need for medication such as Eliquis but currently patient with rectal bleeding cannot be put on this until GI does their testing including colonoscopy Because patient had this happen when he initially went to have his colonoscopy, GI will not see him until he has further work-up by cardiology Echo ordered Continue current medications Does have atrial for but rate is controlled Will have cardiology weigh in on the patient before the patient gets his colonoscopy If GI work-up for bleeding is negative then more than likely cardiology will start Eliquis Follow-up in 3 months Elevated liver enzymes and more than likely related to alcohol use patient to taper off states he has 2 drinks each evening.  Hopefully patient will be successful over the next several weeks quitting if he needs our help he will let us know.  Patient was educated that atrial  for with rapid ventricular response and atrial flutter can be triggered by overuse of alcohol.  Also liver can be affected as well.  Repeat liver and kidney function tests again in approximately 4 weeks

## 2020-06-07 ENCOUNTER — Telehealth: Payer: Self-pay

## 2020-06-07 NOTE — Telephone Encounter (Signed)
lmom for patient to call back to schedule - prev pt of [facility] 2016

## 2020-06-07 NOTE — Telephone Encounter (Signed)
-----   Message from Alvin Critchley sent at 06/07/2020  1:54 PM EDT ----- Regarding: Referral  ----- Message ----- From: Satira Sark, MD Sent: 06/03/2020   8:11 AM EDT To: Kathyrn Drown, MD, Alvin Critchley  Thank you Scott.  I will forward this to our office manager to see if the patient can be put on with any of our providers to assist with this.  I agree with not starting anticoagulation now in the setting of rectal bleeding.  Honestly the only issues will be to see how he is doing symptomatically, make sure that heart rate is adequately controlled, and review the echocardiogram. ----- Message ----- From: Kathyrn Drown, MD Sent: 06/02/2020   7:00 PM EDT To: Satira Sark, MD  Hi Sam  This patient was recently being worked up for rectal bleeding.  When they were about to get him ready for his colonoscopy it was discovered he was in atrial flutter.  He was sent to the ER for further evaluation and recommended to do cardiology follow-up. Currently in atrial fibrillation. CHADS2 score 3 Still has rectal bleeding issue Gastroenterology states they are requesting cardiology consultation before they will undertake the colonoscopy.  I am holding off on any Eliquis until GI work-up is complete. Referral has been made to your department as well as referral for echo I realize your department is swamped  If patient can be cleared for colonoscopy that would be great  Thanks for your help Nicki Reaper

## 2020-06-08 ENCOUNTER — Other Ambulatory Visit: Payer: Self-pay | Admitting: Family Medicine

## 2020-06-09 NOTE — Telephone Encounter (Signed)
The fact that these were denied previously it is important to verify is a patient utilizing a atorvastatin and the steroid cream if so may have 3 months of refill on the medications needs to do follow-up in June

## 2020-06-16 ENCOUNTER — Telehealth: Payer: Self-pay | Admitting: *Deleted

## 2020-06-16 ENCOUNTER — Telehealth: Payer: Self-pay

## 2020-06-16 ENCOUNTER — Other Ambulatory Visit: Payer: Self-pay

## 2020-06-16 DIAGNOSIS — I4821 Permanent atrial fibrillation: Secondary | ICD-10-CM

## 2020-06-16 MED ORDER — METOPROLOL SUCCINATE ER 25 MG PO TB24: 25.0000 mg | ORAL_TABLET | Freq: Every day | ORAL | 5 refills | Status: DC

## 2020-06-16 NOTE — Telephone Encounter (Signed)
May have 6 months ?

## 2020-06-16 NOTE — Telephone Encounter (Signed)
Patient waked in to request refill of Metoprolol that was prescribed in ED  Last seen in 2017, has follow up ED 07/16/2020 Saw PCP 06/02/2020  Patient was instructed to reach out to PCP to request refill until follow up with cardiology

## 2020-06-16 NOTE — Telephone Encounter (Signed)
Pt was prescribed from the hospital metoprolol succinate (TOPROL-XL) 25 MG 24 hr tablet [id number] [place], [place] [address] [place] pt has heart appt May 6 with the heart Doctor and wants to know if Dr Nicki Reaper can prescribe until he is seen Pt is worried about coming of medication and he only has one week left.  Pt call back [phone]

## 2020-06-17 NOTE — Telephone Encounter (Signed)
Please let the patient know that I did send in some refills.  Should he have further troubles let us know.

## 2020-06-17 NOTE — Telephone Encounter (Signed)
Resent to Nurses

## 2020-06-18 ENCOUNTER — Telehealth: Payer: Self-pay | Admitting: Family Medicine

## 2020-06-18 NOTE — Telephone Encounter (Signed)
Left message for patient to schedule Annual Wellness Visit.  Please schedule with Nurse Health Advisor Shannon Crews, RN at [facility]  

## 2020-06-30 ENCOUNTER — Other Ambulatory Visit: Payer: Self-pay | Admitting: *Deleted

## 2020-06-30 DIAGNOSIS — I77810 Thoracic aortic ectasia: Secondary | ICD-10-CM

## 2020-07-01 ENCOUNTER — Ambulatory Visit: Payer: Medicare Other | Admitting: Nurse Practitioner

## 2020-07-05 ENCOUNTER — Other Ambulatory Visit (HOSPITAL_COMMUNITY): Payer: Self-pay | Admitting: Family Medicine

## 2020-07-05 ENCOUNTER — Ambulatory Visit (HOSPITAL_COMMUNITY)
Admission: RE | Admit: 2020-07-05 | Discharge: 2020-07-05 | Disposition: A | Discharge status: Home / Self Care | Payer: Medicare Other | Source: Ambulatory Visit | Attending: Family Medicine | Admitting: Family Medicine

## 2020-07-05 ENCOUNTER — Other Ambulatory Visit: Payer: Self-pay

## 2020-07-05 DIAGNOSIS — I712 Thoracic aortic aneurysm, without rupture: Secondary | ICD-10-CM | POA: Insufficient documentation

## 2020-07-05 DIAGNOSIS — I7121 Aneurysm of the ascending aorta, without rupture: Secondary | ICD-10-CM

## 2020-07-05 LAB — POCT I-STAT CREATININE: Creatinine, Ser: 1.6 mg/dL — ABNORMAL HIGH (ref 0.61–1.24)

## 2020-07-05 MED ORDER — IOHEXOL 350 MG/ML SOLN
75.0000 mL | Freq: Once | INTRAVENOUS | Status: AC | PRN
  Administered 2020-07-05: 75 mL via INTRAVENOUS

## 2020-07-13 ENCOUNTER — Encounter: Payer: Self-pay | Admitting: Cardiology

## 2020-07-15 ENCOUNTER — Encounter: Payer: Self-pay | Admitting: Cardiology

## 2020-07-15 ENCOUNTER — Encounter: Payer: Self-pay | Admitting: Family Medicine

## 2020-07-15 ENCOUNTER — Telehealth: Payer: Self-pay

## 2020-07-15 ENCOUNTER — Telehealth: Payer: Self-pay | Admitting: Family Medicine

## 2020-07-15 NOTE — Telephone Encounter (Signed)
Hi Sam FYI due to various reasons patient has not been able to have his appointment with cardiology until his new patient visit tomorrow with you.  Please reference the March visit with me for details regarding his atrial fib, atrial flutter, gastroenterology is requesting a consultation regarding this issue before they will do his procedure  The reason is he had atrial flutter before the previous procedure which led to it being canceled  Thank you for seeing him and helping him-Darcella Shiffman

## 2020-07-15 NOTE — Progress Notes (Signed)
Cardiology Office Note  Date: 07/16/2020   ID: Anthony Little, DOB 01-09-1951, MRN [id number]  PCP:  Kathyrn Drown, MD  Cardiologist:  Rozann Lesches, MD Electrophysiologist:  None   Chief Complaint  Patient presents with  . Atrial fibrillation and flutter    History of Present Illness: Anthony Little is a 70 y.o. male referred for cardiology consultation by Dr. Wolfgang Phoenix for the evaluation of atrial fibrillation and flutter.  I reviewed his records and updated the chart.  He was scheduled to undergo colonoscopy for evaluation of hematochezia back in February.  This was canceled due to subsequent positive COVID-19 screen.  He presented then in March at which point he was noted to be in typical atrial flutter with variable conduction by ECG, procedure canceled and sent to the ER for further assessment.  He was subsequently seen by Dr. Wolfgang Phoenix at which point he was in atrial fibrillation by follow-up ECG.  He presents today reporting no sense of palpitations or chest pain.  He recalls feeling "anxious" when he was in atrial flutter back in March.  I personally reviewed his ECG today which shows sinus bradycardia.  He has been on Toprol-XL 25 mg daily, and his heart rate was reasonably controlled when he was in atrial fibrillation at last visit with Dr. Wolfgang Phoenix.  At some point he spontaneously converted.  CHA2DS2-VASc score is 5.  I did talk with him about stroke prophylaxis with DOAC, but would defer starting medication until he has his colonoscopy.  He does not report any active hematochezia at this time.  Hemoglobin was 12.8 in March.  He has not had a recent echocardiogram.  Past Medical History:  Diagnosis Date  . Abdominal aortic aneurysm (AAA) ([place])   . Atrial fibrillation and flutter ([place])   . CKD (chronic kidney disease) stage 3, GFR 30-59 ml/min (HCC)   . CVA (cerebral infarction)   . Essential hypertension   . Hyperlipidemia   . Thoracic aortic aneurysm [facility])      Past Surgical History:  Procedure Laterality Date  . COLONOSCOPY  12/2009   Diverticulosis, mild internal hemorrhoids, multiple colon polyps removed (adenomatous). One irregular shaped polyp was tattooed. Next colonoscopy due October 2016.  Marland Kitchen COLONOSCOPY N/A 11/01/2015   Procedure: COLONOSCOPY;  Surgeon: Danie Binder, MD;  Location: [facility];  Service: Endoscopy;  Laterality: N/A;  100  . ESOPHAGOGASTRODUODENOSCOPY N/A 10/08/2012   SLF: Moderate erosive gastritis/duodenitis. Biopsies benign.  . ESOPHAGOGASTRODUODENOSCOPY N/A 11/01/2015   Procedure: ESOPHAGOGASTRODUODENOSCOPY (EGD);  Surgeon: Danie Binder, MD;  Location: [facility];  Service: Endoscopy;  Laterality: N/A;    Current Outpatient Medications  Medication Sig Dispense Refill  . amLODipine (NORVASC) 5 MG tablet Take 1 tablet (5 mg total) by mouth daily. 90 tablet 1  . atorvastatin (LIPITOR) 40 MG tablet Take one tablet po daily (Patient taking differently: Take 40 mg by mouth daily. Take one tablet po daily) 90 tablet 1  . buPROPion (WELLBUTRIN XL) 150 MG 24 hr tablet TAKE 1 TABLET BY MOUTH EVERY DAY (Patient taking differently: Take 150 mg by mouth daily.) 90 tablet 2  . glucosamine-chondroitin 500-400 MG tablet Take 2 tablets by mouth daily at 6 (six) AM.    . hydrochlorothiazide (MICROZIDE) 12.5 MG capsule TAKE 1 CAPSULE BY MOUTH EVERY DAY IN THE MORNING 90 capsule 0  . metoprolol succinate (TOPROL-XL) 25 MG 24 hr tablet Take 1 tablet (25 mg total) by mouth daily. 30 tablet 5  .  potassium chloride (KLOR-CON) 10 MEQ tablet TAKE 1 TABLET BY MOUTH IN THE MORNING (Patient taking differently: Take 10 mEq by mouth 2 (two) times a week. Twice a week) 90 tablet 0  . triamcinolone cream (KENALOG) 0.1 % Apply twice daily PRN (Patient taking differently: Apply 1 application topically 2 (two) times daily as needed (irritation). Apply twice daily PRN) 30 g 0   No current facility-administered medications for this visit.    Allergies:  Patient has no known allergies.   Social History: The patient  reports that he has been smoking cigarettes. He started smoking about 4 years ago. He has been smoking about 0.15 packs per day. He has never used smokeless tobacco. He reports current alcohol use. He reports that he does not use drugs.   Family History: The patient's family history includes Diabetes in his maternal grandfather and mother; Parkinson's disease in his father.   ROS: No dizziness or syncope.  Physical Exam: VS:  BP 126/70   Pulse (!) 52   Ht 5\' 10"  (1.778 m)   Wt 197 lb 9.6 oz (89.6 kg)   SpO2 93%   BMI 28.35 kg/m , BMI Body mass index is 28.35 kg/m.  Wt Readings from Last 3 Encounters:  07/16/20 197 lb 9.6 oz (89.6 kg)  06/02/20 192 lb (87.1 kg)  05/21/20 195 lb (88.5 kg)    General: Patient appears comfortable at rest. HEENT: Conjunctiva and lids normal, wearing a mask. Neck: Supple, no elevated JVP or carotid bruits, no thyromegaly. Lungs: Clear to auscultation, nonlabored breathing at rest. Cardiac: Regular rate and rhythm, no S3, 1/6 systolic murmur, no pericardial rub. Abdomen: Soft, nontender, bowel sounds present. Extremities: No pitting edema, distal pulses 2+. Skin: Warm and dry. Musculoskeletal: No kyphosis. Neuropsychiatric: Alert and oriented x3, affect grossly appropriate.  ECG:  An ECG dated 05/21/2020 was personally reviewed today and demonstrated:  Atrial fibrillation.  Recent Labwork: 05/21/2020: ALT 47; AST 106; BUN 21; Hemoglobin 12.8; Platelets 185; Potassium 4.3; Sodium 135 07/05/2020: Creatinine, Ser 1.60     Component Value Date/Time   CHOL 167 10/14/2019 1350   TRIG 48 10/14/2019 1350   HDL 81 10/14/2019 1350   CHOLHDL 2.1 10/14/2019 1350   CHOLHDL 2.4 07/30/2012 1215   VLDL 11 07/30/2012 1215   LDLCALC 76 10/14/2019 1350    Other Studies Reviewed Today:  Exercise/Lexiscan Myoview 09/10/2015:  Blood pressure demonstrated a hypertensive response to  exercise.  Defect 1: There is a medium defect of mild severity present in the basal inferior, basal inferolateral, mid inferior and apical inferior location.  This is a low risk study.  The left ventricular ejection fraction is hyperdynamic (>65%).  Probable normal perfusion and soft tissue attenuation (diaphragm) No ischemia or definite scar.  Echocardiogram 09/10/2015: - Left ventricle: The cavity size was normal. Wall thickness was  increased in a pattern of mild LVH. Systolic function was  vigorous. The estimated ejection fraction was in the range of 65%  to 70%. Features are consistent with a pseudonormal left  ventricular filling pattern, with concomitant abnormal relaxation  and increased filling pressure (grade 2 diastolic dysfunction).   Abdominal ultrasound 03/25/2020: IMPRESSION: 1. No gallstones. Mild gallbladder wall thickening at 4 mm. This could be seen with cholecystitis and or hypoproteinemic states. No biliary distention. 2. Increased echogenicity of the liver consistent fatty infiltration or hepatocellular disease. No focal hepatic abnormality identified. 3. Increased echogenicity both kidneys consistent with chronic medical renal disease. 4. 1.1 cm simple cyst right kidney. 5.  3.3 cm abdominal aortic aneurysm. This is new from prior study ultrasound of 05/21/2017. Recommend followup by ultrasound in 3 years. This recommendation follows ACR consensus guidelines: White Paper of the ACR Incidental Findings Committee II on Vascular Findings.  Chest CTA 07/05/2020: IMPRESSION: 1. Mild thoracic aortic aneurysm measuring 3.4 cm in the arch, 3.5 cm in the proximal descending segment. Recommend annual imaging followup by CTA or MRA. This recommendation follows 2010 ACCF/AHA/AATS/ACR/ASA/SCA/SCAI/SIR/STS/SVM Guidelines for the Diagnosis and Management of Patients with Thoracic Aortic Disease. Circulation.2010; 121: G401-U272. Aortic aneurysm NOS  (ICD10-I71.9)  Aortic Atherosclerosis (ICD10-I70.0) and Emphysema (ICD10-J43.9).  Assessment and Plan:  1.  Paroxysmal to persistent atrial fibrillation and typical atrial flutter, presently in sinus rhythm.  CHA2DS2-VASc score is 5.  Agree with initiation of Toprol-XL 25 mg daily, he is tolerating current dose.  We will obtain an echocardiogram to assess cardiac structure and function.  Further recommendations to follow once results reviewed, but would anticipate he should be able to go ahead with colonoscopy.  This will help clarify future bleeding risk as we make decision about initiating DOAC.  Even if he is back in atrial fibrillation for the procedure, he should be able to proceed as long as heart rate is controlled.  2.  Mixed hyperlipidemia, on Lipitor.  Last LDL 76  3.  Asymptomatic, thoracic and abdominal aortic aneurysms.  CT and ultrasound imaging studies are noted above.  He has been followed by Dr. Wolfgang Phoenix.  4.  Essential hypertension, blood pressure well controlled today.  Medication Adjustments/Labs and Tests Ordered: Current medicines are reviewed at length with the patient today.  Concerns regarding medicines are outlined above.   Tests Ordered: Orders Placed This Encounter  Procedures  . EKG 12-Lead  . ECHOCARDIOGRAM COMPLETE    Medication Changes: No orders of the defined types were placed in this encounter.   Disposition:  Follow up 3 months.  Signed, Satira Sark, MD, [facility] 07/16/2020 9:23 AM    [facility] [place], [place], [place] [address] Phone: [phone]; Fax: [phone]

## 2020-07-15 NOTE — Telephone Encounter (Signed)
error 

## 2020-07-16 ENCOUNTER — Encounter: Payer: Self-pay | Admitting: Cardiology

## 2020-07-16 ENCOUNTER — Telehealth: Payer: Self-pay | Admitting: Internal Medicine

## 2020-07-16 ENCOUNTER — Ambulatory Visit: Payer: Medicare Other | Admitting: Cardiology

## 2020-07-16 ENCOUNTER — Ambulatory Visit (INDEPENDENT_AMBULATORY_CARE_PROVIDER_SITE_OTHER): Payer: Medicare Other | Admitting: Cardiology

## 2020-07-16 VITALS — BP 126/70 | HR 52 | Ht 70.0 in | Wt 197.6 lb

## 2020-07-16 DIAGNOSIS — I714 Abdominal aortic aneurysm, without rupture, unspecified: Secondary | ICD-10-CM

## 2020-07-16 DIAGNOSIS — I4892 Unspecified atrial flutter: Secondary | ICD-10-CM | POA: Diagnosis not present

## 2020-07-16 DIAGNOSIS — I4891 Unspecified atrial fibrillation: Secondary | ICD-10-CM | POA: Diagnosis not present

## 2020-07-16 DIAGNOSIS — I712 Thoracic aortic aneurysm, without rupture, unspecified: Secondary | ICD-10-CM

## 2020-07-16 DIAGNOSIS — E782 Mixed hyperlipidemia: Secondary | ICD-10-CM | POA: Diagnosis not present

## 2020-07-16 DIAGNOSIS — I10 Essential (primary) hypertension: Secondary | ICD-10-CM | POA: Diagnosis not present

## 2020-07-16 NOTE — Patient Instructions (Addendum)

## 2020-07-21 ENCOUNTER — Ambulatory Visit (HOSPITAL_COMMUNITY): Admission: RE | Admit: 2020-07-21 | Payer: Medicare Other | Source: Ambulatory Visit

## 2020-07-29 ENCOUNTER — Other Ambulatory Visit: Payer: Self-pay

## 2020-07-29 ENCOUNTER — Ambulatory Visit (HOSPITAL_COMMUNITY)
Admission: RE | Admit: 2020-07-29 | Discharge: 2020-07-29 | Disposition: A | Discharge status: Home / Self Care | Payer: Medicare Other | Source: Ambulatory Visit | Attending: Cardiology | Admitting: Cardiology

## 2020-07-29 ENCOUNTER — Telehealth: Payer: Self-pay | Admitting: *Deleted

## 2020-07-29 DIAGNOSIS — I4892 Unspecified atrial flutter: Secondary | ICD-10-CM | POA: Insufficient documentation

## 2020-07-29 DIAGNOSIS — I4891 Unspecified atrial fibrillation: Secondary | ICD-10-CM | POA: Diagnosis not present

## 2020-07-29 LAB — ECHOCARDIOGRAM COMPLETE
AR max vel: 1.98 cm2
AV Area VTI: 1.99 cm2
AV Area mean vel: 2.15 cm2
AV Mean grad: 4.1 mmHg
AV Peak grad: 8.4 mmHg
Ao pk vel: 1.45 m/s
Area-P 1/2: 3.54 cm2
MV M vel: 5.84 m/s
MV Peak grad: 136.4 mmHg
S' Lateral: 3.6 cm

## 2020-07-29 NOTE — Progress Notes (Signed)
*  PRELIMINARY RESULTS* Echocardiogram 2D Echocardiogram has been performed.  Samuel Germany 07/29/2020, 11:14 AM

## 2020-07-29 NOTE — Telephone Encounter (Signed)
Received VM from pt. He states she had his echo done (in epic). His previous procedure was cancelled. Please advise Dr. Abbey Chatters. Thanks

## 2020-08-02 NOTE — Telephone Encounter (Signed)
Pt's wife called office, he has had rectal bleeding on and off for past 3 months. He's lost weight and she feels like he looks bad. Poor appetite. He has back pain and bloating after eating.  Dr. Abbey Chatters please advise for pt's procedure.

## 2020-08-04 MED ORDER — PEG 3350-KCL-NA BICARB-NACL 420 G PO SOLR: 4000.0000 mL | ORAL | 0 refills | Status: DC

## 2020-08-04 NOTE — Addendum Note (Signed)
Addended by: Hassan Rowan on: 08/04/2020 01:50 PM   Modules accepted: Orders

## 2020-08-04 NOTE — Telephone Encounter (Signed)
Called pt, TCS scheduled for 08/17/20 at 2:15pm. Rx for prep sent to pharmacy. Orders entered.

## 2020-08-04 NOTE — Telephone Encounter (Signed)
Please proceed with rescheduling patient's colonoscopy.  ASA 3.  Thank you

## 2020-08-05 ENCOUNTER — Ambulatory Visit: Payer: Medicare Other | Admitting: Family Medicine

## 2020-08-05 NOTE — Telephone Encounter (Signed)
Pre-op appt 08/10/20 at 11:00am. Called and informed pt of pre-op appt. Appt letter and procedure instructions.

## 2020-08-05 NOTE — Telephone Encounter (Signed)
error 

## 2020-08-06 NOTE — Patient Instructions (Signed)
Anthony Little  08/06/2020     @PREFPERIOPPHARMACY @   Your procedure is scheduled on  08/17/2020.   Report to [facility] at  1245  P.M.   Call this number if you have problems the morning of surgery:  [phone]   Remember:  Follow the diet and pre instructions given to you by the office.                       Take these medicines the morning of surgery with A SIP OF WATER  Amlodipine, wellbutrin, metoprolol.     Please brush your teeth.  Do not wear jewelry, make-up or nail polish.  Do not wear lotions, powders, or perfumes, or deodorant.  Do not shave 48 hours prior to surgery.  Men may shave face and neck.  Do not bring valuables to the hospital.  [facility] is not responsible for any belongings or valuables.  Contacts, dentures or bridgework may not be worn into surgery.  Leave your suitcase in the car.  After surgery it may be brought to your room.  For patients admitted to the hospital, discharge time will be determined by your treatment team.  Patients discharged the day of surgery will not be allowed to drive home and must have someone with them for 24 hours.    Special instructions:  DO NOT smoke tobacco or vape for 24 hours before your procedure.   Please read over the following fact sheets that you were given. Anesthesia Post-op Instructions and Care and Recovery After Surgery       Colonoscopy, Adult, Care After This sheet gives you information about how to care for yourself after your procedure. Your health care provider may also give you more specific instructions. If you have problems or questions, contact your health care provider. What can I expect after the procedure? After the procedure, it is common to have:  A small amount of blood in your stool for 24 hours after the procedure.  Some gas.  Mild cramping or bloating of your abdomen. Follow these instructions at home: Eating and drinking  Drink enough fluid to keep your urine  pale yellow.  Follow instructions from your health care provider about eating or drinking restrictions.  Resume your normal diet as instructed by your health care provider. Avoid heavy or fried foods that are hard to digest.   Activity  Rest as told by your health care provider.  Avoid sitting for a long time without moving. Get up to take short walks every 1-2 hours. This is important to improve blood flow and breathing. Ask for help if you feel weak or unsteady.  Return to your normal activities as told by your health care provider. Ask your health care provider what activities are safe for you. Managing cramping and bloating  Try walking around when you have cramps or feel bloated.  Apply heat to your abdomen as told by your health care provider. Use the heat source that your health care provider recommends, such as a moist heat pack or a heating pad. ? Place a towel between your skin and the heat source. ? Leave the heat on for 20-30 minutes. ? Remove the heat if your skin turns bright red. This is especially important if you are unable to feel pain, heat, or cold. You may have a greater risk of getting burned.   General instructions  If you were given a sedative during the  procedure, it can affect you for several hours. Do not drive or operate machinery until your health care provider says that it is safe.  For the first 24 hours after the procedure: ? Do not sign important documents. ? Do not drink alcohol. ? Do your regular daily activities at a slower pace than normal. ? Eat soft foods that are easy to digest.  Take over-the-counter and prescription medicines only as told by your health care provider.  Keep all follow-up visits as told by your health care provider. This is important. Contact a health care provider if:  You have blood in your stool 2-3 days after the procedure. Get help right away if you have:  More than a small spotting of blood in your stool.  Large  blood clots in your stool.  Swelling of your abdomen.  Nausea or vomiting.  A fever.  Increasing pain in your abdomen that is not relieved with medicine. Summary  After the procedure, it is common to have a small amount of blood in your stool. You may also have mild cramping and bloating of your abdomen.  If you were given a sedative during the procedure, it can affect you for several hours. Do not drive or operate machinery until your health care provider says that it is safe.  Get help right away if you have a lot of blood in your stool, nausea or vomiting, a fever, or increased pain in your abdomen. This information is not intended to replace advice given to you by your health care provider. Make sure you discuss any questions you have with your health care provider. Document Revised: 02/21/2019 Document Reviewed: 09/23/2018 Elsevier Patient Education  2021 [place] After This sheet gives you information about how to care for yourself after your procedure. Your health care provider may also give you more specific instructions. If you have problems or questions, contact your health care provider. What can I expect after the procedure? After the procedure, it is common to have:  Tiredness.  Forgetfulness about what happened after the procedure.  Impaired judgment for important decisions.  Nausea or vomiting.  Some difficulty with balance. Follow these instructions at home: For the time period you were told by your health care provider:  Rest as needed.  Do not participate in activities where you could fall or become injured.  Do not drive or use machinery.  Do not drink alcohol.  Do not take sleeping pills or medicines that cause drowsiness.  Do not make important decisions or sign legal documents.  Do not take care of children on your own.      Eating and drinking  Follow the diet that is recommended by your health care  provider.  Drink enough fluid to keep your urine pale yellow.  If you vomit: ? Drink water, juice, or soup when you can drink without vomiting. ? Make sure you have little or no nausea before eating solid foods. General instructions  Have a responsible adult stay with you for the time you are told. It is important to have someone help care for you until you are awake and alert.  Take over-the-counter and prescription medicines only as told by your health care provider.  If you have sleep apnea, surgery and certain medicines can increase your risk for breathing problems. Follow instructions from your health care provider about wearing your sleep device: ? Anytime you are sleeping, including during daytime naps. ? While taking prescription pain medicines,  sleeping medicines, or medicines that make you drowsy.  Avoid smoking.  Keep all follow-up visits as told by your health care provider. This is important. Contact a health care provider if:  You keep feeling nauseous or you keep vomiting.  You feel light-headed.  You are still sleepy or having trouble with balance after 24 hours.  You develop a rash.  You have a fever.  You have redness or swelling around the IV site. Get help right away if:  You have trouble breathing.  You have new-onset confusion at home. Summary  For several hours after your procedure, you may feel tired. You may also be forgetful and have poor judgment.  Have a responsible adult stay with you for the time you are told. It is important to have someone help care for you until you are awake and alert.  Rest as told. Do not drive or operate machinery. Do not drink alcohol or take sleeping pills.  Get help right away if you have trouble breathing, or if you suddenly become confused. This information is not intended to replace advice given to you by your health care provider. Make sure you discuss any questions you have with your health care  provider. Document Revised: 11/13/2019 Document Reviewed: 01/30/2019 Elsevier Patient Education  2021 [employer].

## 2020-08-10 ENCOUNTER — Telehealth: Payer: Self-pay | Admitting: *Deleted

## 2020-08-10 ENCOUNTER — Encounter (HOSPITAL_COMMUNITY): Payer: Self-pay

## 2020-08-10 ENCOUNTER — Other Ambulatory Visit: Payer: Self-pay

## 2020-08-10 ENCOUNTER — Encounter (HOSPITAL_COMMUNITY)
Admission: RE | Admit: 2020-08-10 | Discharge: 2020-08-10 | Disposition: A | Discharge status: Home / Self Care | Payer: Medicare Other | Source: Ambulatory Visit | Attending: Internal Medicine | Admitting: Internal Medicine

## 2020-08-10 DIAGNOSIS — Z01812 Encounter for preprocedural laboratory examination: Secondary | ICD-10-CM | POA: Insufficient documentation

## 2020-08-10 LAB — BASIC METABOLIC PANEL
Anion gap: 8 (ref 5–15)
BUN: 29 mg/dL — ABNORMAL HIGH (ref 8–23)
CO2: 22 mmol/L (ref 22–32)
Calcium: 8.6 mg/dL — ABNORMAL LOW (ref 8.9–10.3)
Chloride: 105 mmol/L (ref 98–111)
Creatinine, Ser: 1.69 mg/dL — ABNORMAL HIGH (ref 0.61–1.24)
GFR, Estimated: 43 mL/min — ABNORMAL LOW (ref >60)
Glucose, Bld: 100 mg/dL — ABNORMAL HIGH (ref 70–99)
Potassium: 4.3 mmol/L (ref 3.5–5.1)
Sodium: 135 mmol/L (ref 135–145)

## 2020-08-10 NOTE — Telephone Encounter (Signed)
-----   Message from Encarnacion Chu, RN sent at 08/10/2020  1:23 PM EDT ----- Regarding: no show [name]! Anthony Little did not show for his PAT today.

## 2020-08-10 NOTE — Telephone Encounter (Signed)
LMOVM to call back 

## 2020-08-11 NOTE — Telephone Encounter (Signed)
Received message from [place] in endo that pt ended up showing up for pre-op

## 2020-08-17 ENCOUNTER — Ambulatory Visit (HOSPITAL_COMMUNITY): Payer: Medicare Other | Admitting: Anesthesiology

## 2020-08-17 ENCOUNTER — Encounter (HOSPITAL_COMMUNITY): Payer: Self-pay

## 2020-08-17 ENCOUNTER — Encounter (HOSPITAL_COMMUNITY)
Admission: RE | Disposition: A | Discharge status: Home / Self Care | Payer: Self-pay | Source: Home / Self Care | Attending: Internal Medicine

## 2020-08-17 ENCOUNTER — Ambulatory Visit (HOSPITAL_COMMUNITY)
Admission: RE | Admit: 2020-08-17 | Discharge: 2020-08-17 | Disposition: A | Discharge status: Home / Self Care | Payer: Medicare Other | Attending: Internal Medicine | Admitting: Internal Medicine

## 2020-08-17 DIAGNOSIS — K625 Hemorrhage of anus and rectum: Secondary | ICD-10-CM | POA: Diagnosis not present

## 2020-08-17 DIAGNOSIS — Z86010 Personal history of colonic polyps: Secondary | ICD-10-CM | POA: Diagnosis not present

## 2020-08-17 DIAGNOSIS — F17210 Nicotine dependence, cigarettes, uncomplicated: Secondary | ICD-10-CM | POA: Insufficient documentation

## 2020-08-17 DIAGNOSIS — I4891 Unspecified atrial fibrillation: Secondary | ICD-10-CM | POA: Diagnosis not present

## 2020-08-17 DIAGNOSIS — Z79899 Other long term (current) drug therapy: Secondary | ICD-10-CM | POA: Diagnosis not present

## 2020-08-17 DIAGNOSIS — D122 Benign neoplasm of ascending colon: Secondary | ICD-10-CM | POA: Insufficient documentation

## 2020-08-17 DIAGNOSIS — K921 Melena: Secondary | ICD-10-CM | POA: Diagnosis not present

## 2020-08-17 DIAGNOSIS — K635 Polyp of colon: Secondary | ICD-10-CM

## 2020-08-17 DIAGNOSIS — K648 Other hemorrhoids: Secondary | ICD-10-CM | POA: Diagnosis not present

## 2020-08-17 DIAGNOSIS — D123 Benign neoplasm of transverse colon: Secondary | ICD-10-CM | POA: Diagnosis not present

## 2020-08-17 DIAGNOSIS — K5730 Diverticulosis of large intestine without perforation or abscess without bleeding: Secondary | ICD-10-CM | POA: Insufficient documentation

## 2020-08-17 DIAGNOSIS — D126 Benign neoplasm of colon, unspecified: Secondary | ICD-10-CM | POA: Diagnosis not present

## 2020-08-17 HISTORY — PX: COLONOSCOPY WITH PROPOFOL: SHX5780

## 2020-08-17 HISTORY — PX: POLYPECTOMY: SHX5525

## 2020-08-17 HISTORY — PX: BIOPSY: SHX5522

## 2020-08-17 SURGERY — COLONOSCOPY WITH PROPOFOL
Anesthesia: General

## 2020-08-17 MED ORDER — LACTATED RINGERS IV SOLN: INTRAVENOUS | Status: DC

## 2020-08-17 MED ORDER — PROPOFOL 10 MG/ML IV BOLUS
INTRAVENOUS | Status: AC
  Filled 2020-08-17: qty 20

## 2020-08-17 MED ORDER — STERILE WATER FOR IRRIGATION IR SOLN
Status: DC | PRN
  Administered 2020-08-17: 100 mL

## 2020-08-17 MED ORDER — PROPOFOL 10 MG/ML IV BOLUS
INTRAVENOUS | Status: DC | PRN
  Administered 2020-08-17: 100 mg via INTRAVENOUS
  Administered 2020-08-17: 50 mg via INTRAVENOUS
  Administered 2020-08-17 (×2): 100 mg via INTRAVENOUS
  Administered 2020-08-17 (×3): 50 mg via INTRAVENOUS

## 2020-08-17 MED ORDER — METOPROLOL TARTRATE 5 MG/5ML IV SOLN
INTRAVENOUS | Status: DC | PRN
  Administered 2020-08-17: 2.5 mg via INTRAVENOUS

## 2020-08-17 MED ORDER — METOPROLOL TARTRATE 5 MG/5ML IV SOLN
INTRAVENOUS | Status: AC
  Filled 2020-08-17: qty 5

## 2020-08-17 MED ORDER — METOPROLOL TARTRATE 5 MG/5ML IV SOLN
2.5000 mg | Freq: Once | INTRAVENOUS | Status: AC
  Administered 2020-08-17: 2.5 mg via INTRAVENOUS

## 2020-08-17 NOTE — Op Note (Signed)
[facility] Patient Name: Anthony Little Procedure Date: 08/17/2020 1:49 PM MRN: [id number] Date of Birth: [birth year] Attending MD: Elon Alas. Abbey Chatters DO CSN: [id number] Age: 70 Admit Type: Outpatient Procedure:                Colonoscopy Indications:              High risk colon cancer surveillance: Personal                            history of colonic polyps, Surveillance: Personal                            history of adenomatous polyps on last colonoscopy 5                            years ago Providers:                Elon Alas. Abbey Chatters, DO, Lambert Mody, Dereck Leep, Technician Referring MD:              Medicines:                See the Anesthesia note for documentation of the                            administered medications Complications:            No immediate complications. Estimated Blood Loss:     Estimated blood loss was minimal. Procedure:                Pre-Anesthesia Assessment:                           - The anesthesia plan was to use monitored                            anesthesia care (MAC).                           After obtaining informed consent, the colonoscope                            was passed under direct vision. Throughout the                            procedure, the patient's blood pressure, pulse, and                            oxygen saturations were monitored continuously. The                            [id number]-[id number] ([id number]) scope was introduced through                            the anus and advanced to the the cecum, identified  by appendiceal orifice and ileocecal valve. The                            colonoscopy was performed without difficulty. The                            patient tolerated the procedure well. The quality                            of the bowel preparation was evaluated using the                            BBPS [facility] Bowel Preparation Scale)  with scores                            of: Right Colon = 3, Transverse Colon = 3 and Left                            Colon = 3 (entire mucosa seen well with no residual                            staining, small fragments of stool or opaque                            liquid). The total BBPS score equals 9. Scope In: 1:57:27 PM Scope Out: 2:17:59 PM Scope Withdrawal Time: 0 hours 13 minutes 41 seconds  Total Procedure Duration: 0 hours 20 minutes 32 seconds  Findings:      The perianal and digital rectal examinations were normal.      Non-bleeding internal hemorrhoids were found during endoscopy.      Scattered small and large-mouthed diverticula were found in the entire       colon.      Four sessile polyps were found in the transverse colon and ascending       colon. The polyps were 2 mm in size. These polyps were removed with a       cold biopsy forceps. Resection and retrieval were complete.      The exam was otherwise without abnormality.      A tattoo was seen in the sigmoid colon. A post-polypectomy scar was       found at the tattoo site. There was no evidence of residual polyp tissue. Impression:               - Non-bleeding internal hemorrhoids.                           - Diverticulosis in the entire examined colon.                           - Four 2 mm polyps in the transverse colon and in                            the ascending colon, removed with a cold biopsy  forceps. Resected and retrieved.                           - The examination was otherwise normal.                           - A tattoo was seen in the sigmoid colon. A                            post-polypectomy scar was found at the tattoo site.                            There was no evidence of residual polyp tissue. Moderate Sedation:      Per Anesthesia Care Recommendation:           - Patient has a contact number available for                            emergencies. The signs and  symptoms of potential                            delayed complications were discussed with the                            patient. Return to normal activities tomorrow.                            Written discharge instructions were provided to the                            patient.                           - Resume previous diet.                           - Continue present medications.                           - Await pathology results.                           - Repeat colonoscopy in 5 years for surveillance.                           - Return to GI clinic PRN. Procedure Code(s):        --- Professional ---                           [id number], Colonoscopy, flexible; with biopsy, single                            or multiple Diagnosis Code(s):        --- Professional ---  K64.8, Other hemorrhoids                           K63.5, Polyp of colon                           Z86.010, Personal history of colonic polyps                           K57.30, Diverticulosis of large intestine without                            perforation or abscess without bleeding CPT copyright 2019 American Medical Association. All rights reserved. The codes documented in this report are preliminary and upon coder review may  be revised to meet current compliance requirements. Elon Alas. Abbey Chatters, DO [place] Abbey Chatters, DO 08/17/2020 2:23:09 PM This report has been signed electronically. Number of Addenda: 0

## 2020-08-17 NOTE — Anesthesia Postprocedure Evaluation (Signed)
Anesthesia Post Note  Patient: Rickie C Waid  Procedure(s) Performed: COLONOSCOPY WITH PROPOFOL (N/A ) POLYPECTOMY BIOPSY  Patient location during evaluation: Phase II Anesthesia Type: General Level of consciousness: awake and alert and oriented Pain management: pain level controlled Vital Signs Assessment: post-procedure vital signs reviewed and stable Respiratory status: spontaneous breathing and respiratory function stable Cardiovascular status: blood pressure returned to baseline and stable Postop Assessment: no apparent nausea or vomiting Anesthetic complications: no   No complications documented.   Last Vitals:  Vitals:   08/17/20 1326 08/17/20 1422  BP: (!) 156/96 128/83  Pulse: 96 97  Resp: 18 16  Temp: 36.7 C 36.4 C  SpO2: 100% 100%    Last Pain:  Vitals:   08/17/20 1422  TempSrc: Oral  PainSc: 0-No pain                 Akif Weldy C Anastasia Tompson

## 2020-08-17 NOTE — Transfer of Care (Signed)
Immediate Anesthesia Transfer of Care Note  Patient: Anthony Little  Procedure(s) Performed: COLONOSCOPY WITH PROPOFOL (N/A ) POLYPECTOMY BIOPSY  Patient Location: PACU  Anesthesia Type:MAC  Level of Consciousness: awake, alert  and oriented  Airway & Oxygen Therapy: Patient Spontanous Breathing  Post-op Assessment: Report given to RN and Post -op Vital signs reviewed and stable  Post vital signs: Reviewed and stable  Last Vitals:  Vitals Value Taken Time  BP    Temp    Pulse    Resp    SpO2      Last Pain:  Vitals:   08/17/20 1353  TempSrc:   PainSc: 0-No pain      Patients Stated Pain Goal: 5 (95/62/13 0865)  Complications: No complications documented.

## 2020-08-17 NOTE — Discharge Instructions (Addendum)
Monitored Anesthesia Care, Care After This sheet gives you information about how to care for yourself after your procedure. Your health care provider may also give you more specific instructions. If you have problems or questions, contact your health care provider. What can I expect after the procedure? After the procedure, it is common to have:  Tiredness.  Forgetfulness about what happened after the procedure.  Impaired judgment for important decisions.  Nausea or vomiting.  Some difficulty with balance. Follow these instructions at home: For the time period you were told by your health care provider:  Rest as needed.  Do not participate in activities where you could fall or become injured.  Do not drive or use machinery.  Do not drink alcohol.  Do not take sleeping pills or medicines that cause drowsiness.  Do not make important decisions or sign legal documents.  Do not take care of children on your own.      Eating and drinking  Follow the diet that is recommended by your health care provider.  Drink enough fluid to keep your urine pale yellow.  If you vomit: ? Drink water, juice, or soup when you can drink without vomiting. ? Make sure you have little or no nausea before eating solid foods. General instructions  Have a responsible adult stay with you for the time you are told. It is important to have someone help care for you until you are awake and alert.  Take over-the-counter and prescription medicines only as told by your health care provider.  If you have sleep apnea, surgery and certain medicines can increase your risk for breathing problems. Follow instructions from your health care provider about wearing your sleep device: ? Anytime you are sleeping, including during daytime naps. ? While taking prescription pain medicines, sleeping medicines, or medicines that make you drowsy.  Avoid smoking.  Keep all follow-up visits as told by your health care  provider. This is important. Contact a health care provider if:  You keep feeling nauseous or you keep vomiting.  You feel light-headed.  You are still sleepy or having trouble with balance after 24 hours.  You develop a rash.  You have a fever.  You have redness or swelling around the IV site. Get help right away if:  You have trouble breathing.  You have new-onset confusion at home. Summary  For several hours after your procedure, you may feel tired. You may also be forgetful and have poor judgment.  Have a responsible adult stay with you for the time you are told. It is important to have someone help care for you until you are awake and alert.  Rest as told. Do not drive or operate machinery. Do not drink alcohol or take sleeping pills.  Get help right away if you have trouble breathing, or if you suddenly become confused. This information is not intended to replace advice given to you by your health care provider. Make sure you discuss any questions you have with your health care provider. Document Revised: 11/13/2019 Document Reviewed: 01/30/2019 Elsevier Patient Education  2021 [name].  Colonoscopy Discharge Instructions  Read the instructions outlined below and refer to this sheet in the next few weeks. These discharge instructions provide you with general information on caring for yourself after you leave the hospital. Your doctor may also give you specific instructions. While your treatment has been planned according to the most current medical practices available, unavoidable complications occasionally occur.   ACTIVITY  You may resume  your regular activity, but move at a slower pace for the next 24 hours.   Take frequent rest periods for the next 24 hours.   Walking will help get rid of the air and reduce the bloated feeling in your belly (abdomen).   No driving for 24 hours (because of the medicine (anesthesia) used during the test).    Do not sign  any important legal documents or operate any machinery for 24 hours (because of the anesthesia used during the test).  NUTRITION  Drink plenty of fluids.   You may resume your normal diet as instructed by your doctor.   Begin with a light meal and progress to your normal diet. Heavy or fried foods are harder to digest and may make you feel sick to your stomach (nauseated).   Avoid alcoholic beverages for 24 hours or as instructed.  MEDICATIONS  You may resume your normal medications unless your doctor tells you otherwise.  WHAT YOU CAN EXPECT TODAY  Some feelings of bloating in the abdomen.   Passage of more gas than usual.   Spotting of blood in your stool or on the toilet paper.  IF YOU HAD POLYPS REMOVED DURING THE COLONOSCOPY:  No aspirin products for 7 days or as instructed.   No alcohol for 7 days or as instructed.   Eat a soft diet for the next 24 hours.  FINDING OUT THE RESULTS OF YOUR TEST Not all test results are available during your visit. If your test results are not back during the visit, make an appointment with your caregiver to find out the results. Do not assume everything is normal if you have not heard from your caregiver or the medical facility. It is important for you to follow up on all of your test results.  SEEK IMMEDIATE MEDICAL ATTENTION IF:  You have more than a spotting of blood in your stool.   Your belly is swollen (abdominal distention).   You are nauseated or vomiting.   You have a temperature over 101.   You have abdominal pain or discomfort that is severe or gets worse throughout the day.   Your colonoscopy revealed 4 polyp(s) which I removed successfully. Await pathology results, my office will contact you. I recommend repeating colonoscopy in 5 years for surveillance purposes. You also have diverticulosis and internal hemorrhoids. I would recommend increasing fiber in your diet or adding OTC Benefiber/Metamucil. Be sure to drink at least 4  to 6 glasses of water daily. Follow-up with GI as needed. If you have recurrent GI bleeding then call our office to schedule hemorrhoid banding with Roseanne Kaufman.   I hope you have a great rest of your week!  Anthony Little. Anthony Little, D.O. Gastroenterology and Hepatology [facility]

## 2020-08-17 NOTE — H&P (Signed)
Primary Care Physician:  Kathyrn Drown, MD Primary Gastroenterologist:  Dr. Abbey Chatters  Pre-Procedure History & Physical: HPI:  Anthony Little is a 70 y.o. male is here for a colonoscopy for rectal bleeding and feeling unwell.   Past Medical History:  Diagnosis Date  . Abdominal aortic aneurysm (AAA) ([place])   . Atrial fibrillation and flutter ([place])   . CKD (chronic kidney disease) stage 3, GFR 30-59 ml/min (HCC)   . CVA (cerebral infarction)   . Essential hypertension   . Hyperlipidemia   . Thoracic aortic aneurysm [facility])     Past Surgical History:  Procedure Laterality Date  . COLONOSCOPY  12/2009   Diverticulosis, mild internal hemorrhoids, multiple colon polyps removed (adenomatous). One irregular shaped polyp was tattooed. Next colonoscopy due October 2016.  Marland Kitchen COLONOSCOPY N/A 11/01/2015   Procedure: COLONOSCOPY;  Surgeon: Danie Binder, MD;  Location: [facility] ENDO [facility];  Service: Endoscopy;  Laterality: N/A;  100  . ESOPHAGOGASTRODUODENOSCOPY N/A 10/08/2012   SLF: Moderate erosive gastritis/duodenitis. Biopsies benign.  . ESOPHAGOGASTRODUODENOSCOPY N/A 11/01/2015   Procedure: ESOPHAGOGASTRODUODENOSCOPY (EGD);  Surgeon: Danie Binder, MD;  Location: [facility] SU[facility];  Service: Endoscopy;  Laterality: N/A;    Prior to Admission medications   Medication Sig Start Date End Date Taking? Authorizing Provider  amLODipine (NORVASC) 5 MG tablet Take 1 tablet (5 mg total) by mouth daily. 03/18/20  Yes Kathyrn Drown, MD  atorvastatin (LIPITOR) 40 MG tablet Take one tablet po daily Patient taking differently: Take 40 mg by mouth daily. Take one tablet po daily 03/18/20  Yes Luking, Scott A, MD  buPROPion (WELLBUTRIN XL) 150 MG 24 hr tablet TAKE 1 TABLET BY MOUTH EVERY DAY Patient taking differently: Take 150 mg by mouth daily. 04/12/20  Yes Kathyrn Drown, MD  metoprolol succinate (TOPROL-XL) 25 MG 24 hr tablet Take 1 tablet (25 mg total) by mouth daily. 06/16/20  Yes Luking, Elayne Snare, MD  potassium  chloride (KLOR-CON) 10 MEQ tablet TAKE 1 TABLET BY MOUTH IN THE MORNING Patient taking differently: Take 10 mEq by mouth 2 (two) times a week. Twice a week 01/19/20  Yes Luking, Elayne Snare, MD  glucosamine-chondroitin 500-400 MG tablet Take 2 tablets by mouth every morning.    [provider]  hydrochlorothiazide (MICROZIDE) 12.5 MG capsule TAKE 1 CAPSULE BY MOUTH EVERY DAY IN THE MORNING Patient taking differently: Take 12.5 mg by mouth daily. 04/21/20   Kathyrn Drown, MD  polyethylene glycol-electrolytes (TRILYTE) 420 g solution Take 4,000 mLs by mouth as directed. 08/04/20   Eloise Harman, DO  triamcinolone cream (KENALOG) 0.1 % Apply twice daily PRN Patient taking differently: Apply 1 application topically 2 (two) times daily as needed (irritation). Apply twice daily PRN 08/09/18   Kathyrn Drown, MD    Allergies as of 08/04/2020  . (No Known Allergies)    Family History  Problem Relation Age of Onset  . Diabetes Mother   . Diabetes Maternal Grandfather   . Parkinson's disease Father   . Ulcers Neg Hx   . Colon cancer Neg Hx     Social History   Socioeconomic History  . Marital status: Married    Spouse name: Not on file  . Number of children: Not on file  . Years of education: Not on file  . Highest education level: Not on file  Occupational History  . Occupation: Secondary school teacher  Tobacco Use  . Smoking status: Current Every Day Smoker    Packs/day: 0.15  Types: Cigarettes    Start date: 10/04/2015  . Smokeless tobacco: Never Used  . Tobacco comment: he would like to but he knows it is hard   Vaping Use  . Vaping Use: Never used  Substance and Sexual Activity  . Alcohol use: Yes    Alcohol/week: 0.0 standard drinks    Comment: liquor 5 ounces per night, long-time  . Drug use: No  . Sexual activity: Not on file  Other Topics Concern  . Not on file  Social History Narrative  . Not on file   Social Determinants of Health   Financial Resource Strain:  Not on file  Food Insecurity: Not on file  Transportation Needs: Not on file  Physical Activity: Not on file  Stress: Not on file  Social Connections: Not on file  Intimate Partner Violence: Not on file    Review of Systems: See HPI, otherwise negative ROS  Physical Exam: Vital signs in last 24 hours:     General:   Alert,  Well-developed, well-nourished, pleasant and cooperative in NAD Head:  Normocephalic and atraumatic. Eyes:  Sclera clear, no icterus.   Conjunctiva pink. Ears:  Normal auditory acuity. Nose:  No deformity, discharge,  or lesions. Mouth:  No deformity or lesions, dentition normal. Neck:  Supple; no masses or thyromegaly. Lungs:  Clear throughout to auscultation.   No wheezes, crackles, or rhonchi. No acute distress. Heart:  Regular rate and rhythm; no murmurs, clicks, rubs,  or gallops. Abdomen:  Soft, nontender and nondistended. No masses, hepatosplenomegaly or hernias noted. Normal bowel sounds, without guarding, and without rebound.   Msk:  Symmetrical without gross deformities. Normal posture. Extremities:  Without clubbing or edema. Neurologic:  Alert and  oriented x4;  grossly normal neurologically. Skin:  Intact without significant lesions or rashes. Cervical Nodes:  No significant cervical adenopathy. Psych:  Alert and cooperative. Normal mood and affect.  Impression/Plan: Anthony Little is here for a colonoscopy to be performed for rectal bleeding  The risks of the procedure including infection, bleed, or perforation as well as benefits, limitations, alternatives and imponderables have been reviewed with the patient. Questions have been answered. All parties agreeable.

## 2020-08-17 NOTE — Anesthesia Preprocedure Evaluation (Signed)
Anesthesia Evaluation  Patient identified by MRN, date of birth, ID band Patient awake    Reviewed: Allergy & Precautions, NPO status , Patient's Chart, lab work & pertinent test results  History of Anesthesia Complications Negative for: history of anesthetic complications  Airway Mallampati: III  TM Distance: >3 FB Neck ROM: Full  Mouth opening: Limited Mouth Opening  Dental  (+) Dental Advisory Given, Missing, Chipped, Poor Dentition   Pulmonary Current Smoker and Patient abstained from smoking.,    Pulmonary exam normal breath sounds clear to auscultation       Cardiovascular Exercise Tolerance: Good hypertension, Pt. on medications + CAD and + Peripheral Vascular Disease (TAA. AAA)  + dysrhythmias Atrial Fibrillation  Rhythm:Irregular Rate:Tachycardia     Neuro/Psych PSYCHIATRIC DISORDERS    GI/Hepatic negative GI ROS, (+)     substance abuse  alcohol use,   Endo/Other  negative endocrine ROS  Renal/GU Renal InsufficiencyRenal disease     Musculoskeletal negative musculoskeletal ROS (+)   Abdominal   Peds  Hematology negative hematology ROS (+)   Anesthesia Other Findings 1. Mild thoracic aortic aneurysm measuring 3.4 cm in the arch, 3.5 cm in the proximal descending segment. Recommend annual imaging followup by CTA or MRA. This recommendation follows 2010 ACCF/AHA/AATS/ACR/ASA/SCA/SCAI/SIR/STS/SVM Guidelines for the Diagnosis and Management of Patients with Thoracic Aortic Disease.  Reproductive/Obstetrics negative OB ROS                             Anesthesia Physical Anesthesia Plan  ASA: III  Anesthesia Plan: General   Post-op Pain Management:    Induction: Intravenous  PONV Risk Score and Plan: Propofol infusion  Airway Management Planned: Nasal Cannula and Natural Airway  Additional Equipment:   Intra-op Plan:   Post-operative Plan:   Informed Consent: I  have reviewed the patients History and Physical, chart, labs and discussed the procedure including the risks, benefits and alternatives for the proposed anesthesia with the patient or authorized representative who has indicated his/her understanding and acceptance.     Dental advisory given  Plan Discussed with: CRNA and Surgeon  Anesthesia Plan Comments:         Anesthesia Quick Evaluation

## 2020-08-19 LAB — SURGICAL PATHOLOGY

## 2020-08-25 ENCOUNTER — Encounter (HOSPITAL_COMMUNITY): Payer: Self-pay | Admitting: Internal Medicine

## 2020-08-26 ENCOUNTER — Telehealth: Payer: Self-pay

## 2020-08-26 NOTE — Telephone Encounter (Signed)
Please NIC repeat colonoscopy in 5 years.

## 2020-09-03 ENCOUNTER — Ambulatory Visit: Payer: Medicare Other | Admitting: Family Medicine

## 2020-09-23 ENCOUNTER — Other Ambulatory Visit: Payer: Self-pay | Admitting: Family Medicine

## 2020-09-23 ENCOUNTER — Ambulatory Visit: Payer: Medicare Other | Admitting: Family Medicine

## 2020-09-23 DIAGNOSIS — I4821 Permanent atrial fibrillation: Secondary | ICD-10-CM

## 2020-09-29 DIAGNOSIS — H1033 Unspecified acute conjunctivitis, bilateral: Secondary | ICD-10-CM | POA: Diagnosis not present

## 2020-10-11 ENCOUNTER — Ambulatory Visit (INDEPENDENT_AMBULATORY_CARE_PROVIDER_SITE_OTHER): Payer: Medicare Other | Admitting: Family Medicine

## 2020-10-11 ENCOUNTER — Encounter: Payer: Self-pay | Admitting: Family Medicine

## 2020-10-11 ENCOUNTER — Other Ambulatory Visit: Payer: Self-pay

## 2020-10-11 VITALS — HR 75 | Temp 97.7°F | Ht 70.0 in | Wt 201.0 lb

## 2020-10-11 DIAGNOSIS — D692 Other nonthrombocytopenic purpura: Secondary | ICD-10-CM

## 2020-10-11 DIAGNOSIS — I10 Essential (primary) hypertension: Secondary | ICD-10-CM | POA: Diagnosis not present

## 2020-10-11 DIAGNOSIS — R109 Unspecified abdominal pain: Secondary | ICD-10-CM

## 2020-10-11 DIAGNOSIS — E7849 Other hyperlipidemia: Secondary | ICD-10-CM

## 2020-10-11 MED ORDER — FLUOXETINE HCL 10 MG PO CAPS: 10.0000 mg | ORAL_CAPSULE | Freq: Every day | ORAL | 3 refills | Status: DC

## 2020-10-11 MED ORDER — ATORVASTATIN CALCIUM 40 MG PO TABS: ORAL_TABLET | ORAL | 1 refills | Status: DC

## 2020-10-11 MED ORDER — AMLODIPINE BESYLATE 5 MG PO TABS: 5.0000 mg | ORAL_TABLET | Freq: Every day | ORAL | 1 refills | Status: DC

## 2020-10-11 NOTE — Progress Notes (Signed)
   Subjective:    Patient ID: Anthony Little, male    DOB: 10/07/1950, 70 y.o.   MRN: [id number]  HPI Abdominal Bloating causing - Patient relates he has intermittent abdominal bloating mainly on his abdomen after eating he states he gets bloated and uncomfortable feeling and it hurts in his back on the flanks left or right that lasts about 20 minutes after eating and then gradually gets better bad enough to where it makes him feel like he cannot get comfortable Relates how sometimes he gets nauseous with it He did see gastroenterology gastroenterology that did further work-up including colonoscopy they released him  Pain in both sides of lower back taking gas ex prior to foods  Review of Systems     Objective:   Physical Exam Lungs are clear heart regular pulse normal extremities no edema skin warm dry       Assessment & Plan:  1. Abdominal pain, unspecified abdominal location Intermittent abdominal pain and discomfort.  Also with flank pain and discomfort after eating concerning for the possibility of intestinal claudication/intestinal arterial insufficiency we will start off with a CAT scan but may end up needing to have a CAT scan arteriogram - Lipid panel - Hepatic function panel - Basic metabolic panel - CBC with Differential/Platelet - CT Abdomen Pelvis W Contrast  2. Primary hypertension Blood pressure decent control continue current measures - Lipid panel - Hepatic function panel - Basic metabolic panel - CBC with Differential/Platelet  3. Purpura senilis (HCC) Purpura noted check CBC to make sure platelets are okay - Lipid panel - Hepatic function panel - Basic metabolic panel - CBC with Differential/Platelet  4. Other hyperlipidemia Hyperlipidemia check lipid profile - Lipid panel - Hepatic function panel - Basic metabolic panel - CBC with Differential/Platelet

## 2020-10-13 DIAGNOSIS — D692 Other nonthrombocytopenic purpura: Secondary | ICD-10-CM | POA: Diagnosis not present

## 2020-10-13 DIAGNOSIS — E7849 Other hyperlipidemia: Secondary | ICD-10-CM | POA: Diagnosis not present

## 2020-10-13 DIAGNOSIS — R109 Unspecified abdominal pain: Secondary | ICD-10-CM | POA: Diagnosis not present

## 2020-10-13 DIAGNOSIS — I10 Essential (primary) hypertension: Secondary | ICD-10-CM | POA: Diagnosis not present

## 2020-10-14 ENCOUNTER — Other Ambulatory Visit: Payer: Self-pay

## 2020-10-14 DIAGNOSIS — E875 Hyperkalemia: Secondary | ICD-10-CM

## 2020-10-14 LAB — HEPATIC FUNCTION PANEL
ALT: 22 IU/L (ref 0–44)
AST: 52 IU/L — ABNORMAL HIGH (ref 0–40)
Albumin: 4.7 g/dL (ref 3.8–4.8)
Alkaline Phosphatase: 81 IU/L (ref 44–121)
Bilirubin Total: 0.8 mg/dL (ref 0.0–1.2)
Bilirubin, Direct: 0.3 mg/dL (ref 0.00–0.40)
Total Protein: 7 g/dL (ref 6.0–8.5)

## 2020-10-14 LAB — BASIC METABOLIC PANEL
BUN/Creatinine Ratio: 16 (ref 10–24)
BUN: 24 mg/dL (ref 8–27)
CO2: 21 mmol/L (ref 20–29)
Calcium: 9.1 mg/dL (ref 8.6–10.2)
Chloride: 103 mmol/L (ref 96–106)
Creatinine, Ser: 1.53 mg/dL — ABNORMAL HIGH (ref 0.76–1.27)
Glucose: 86 mg/dL (ref 65–99)
Potassium: 5.7 mmol/L — ABNORMAL HIGH (ref 3.5–5.2)
Sodium: 137 mmol/L (ref 134–144)
eGFR: 49 mL/min/{1.73_m2} — ABNORMAL LOW (ref >59)

## 2020-10-14 LAB — LIPID PANEL
Chol/HDL Ratio: 1.8 ratio (ref 0.0–5.0)
Cholesterol, Total: 173 mg/dL (ref 100–199)
HDL: 97 mg/dL (ref >39)
LDL Chol Calc (NIH): 64 mg/dL (ref 0–99)
Triglycerides: 61 mg/dL (ref 0–149)
VLDL Cholesterol Cal: 12 mg/dL (ref 5–40)

## 2020-10-14 LAB — CBC WITH DIFFERENTIAL/PLATELET
Basophils Absolute: 0.1 10*3/uL (ref 0.0–0.2)
Basos: 1 %
EOS (ABSOLUTE): 0.2 10*3/uL (ref 0.0–0.4)
Eos: 3 %
Hematocrit: 37.9 % (ref 37.5–51.0)
Hemoglobin: 13.1 g/dL (ref 13.0–17.7)
Immature Grans (Abs): 0 10*3/uL (ref 0.0–0.1)
Immature Granulocytes: 0 %
Lymphocytes Absolute: 1.5 10*3/uL (ref 0.7–3.1)
Lymphs: 22 %
MCH: 35.5 pg — ABNORMAL HIGH (ref 26.6–33.0)
MCHC: 34.6 g/dL (ref 31.5–35.7)
MCV: 103 fL — ABNORMAL HIGH (ref 79–97)
Monocytes Absolute: 0.6 10*3/uL (ref 0.1–0.9)
Monocytes: 9 %
Neutrophils Absolute: 4.5 10*3/uL (ref 1.4–7.0)
Neutrophils: 65 %
Platelets: 222 10*3/uL (ref 150–450)
RBC: 3.69 x10E6/uL — ABNORMAL LOW (ref 4.14–5.80)
RDW: 12.9 % (ref 11.6–15.4)
WBC: 6.8 10*3/uL (ref 3.4–10.8)

## 2020-10-17 NOTE — Progress Notes (Signed)
Cardiology Office Note  Date: 10/18/2020   ID: Anthony Little, DOB 09-28-1950, MRN [id number]  PCP:  Kathyrn Drown, MD  Cardiologist:  Rozann Lesches, MD Electrophysiologist:  None   Chief Complaint: 78-monthfollow-up  History of Present Illness: Anthony Little is a 70y.o. male with a history of atrial fibrillation/flutter, CKD, CVA, HTN, HLD, thoracic aortic aneurysm, abdominal aortic aneurysm.  He was last seen by Dr. MDomenic Politeon 07/16/2020 referred by Dr. LWolfgang Phoenixfor evaluation of atrial fibrillation and flutter.  He had presented in March and was noted to be in typical atrial flutter with variable conduction.  He had been scheduled to undergo colonoscopy for evaluation of hematochezia back in February.  This had been canceled due to COVID infection.  He had followed up with Dr. LWolfgang Phoenixat which point he was noted to be in atrial fibrillation by EKG.  At visit with Dr. MDomenic Politehe reported no sense of palpitations or chest pain.  EKG during that visit showed sinus bradycardia.  He had been on Toprol-XL 25 mg daily and heart rate was controlled when he was in atrial fibrillation at his last visit with Dr. LWolfgang Phoenix  CHA2DS2-VASc score was 5.  Plan was to defer anticoagulation until undergoing colonoscopy. Echocardiogram was ordered. He was continuing Lipitor and LDL was 76. He was being followed by PCP for thoracic and abdominal aortic aneurysms. His BP was well controlled on current therapy.    He is here for 3101-monthollow-up today.  Recently had a colonoscopy which showed no evidence of bleeding.  He did have evidence of diverticulosis and had 4 small polyps which were removed.  He had some nonbleeding internal hemorrhoids.  His most recent hemoglobin was 13.8 on 10/13/2020.  He denies any further evidence of bleeding.  Denies any anginal or exertional symptoms, palpitations or arrhythmia, orthostatic symptoms, CVA or TIA-like symptoms, PND, orthopnea. His echocardiogram in the interim  since last visit demonstrated EF of 60 to 65%.  No WMA's.  Diastolic parameters normal.  LA size moderately dilated mild mitral regurgitation.  EKG today shows continuing atrial fibrillation with rate at 88 bpm.  Blood pressure currently well controlled at 130/76.   Past Medical History:  Diagnosis Date   Abdominal aortic aneurysm (AAA) (HCC)    Atrial fibrillation and flutter (HCC)    CKD (chronic kidney disease) stage 3, GFR 30-59 ml/min (HCC)    CVA (cerebral infarction)    Essential hypertension    Hyperlipidemia    [facility]    Past Surgical History:  Procedure Laterality Date   BIOPSY  08/17/2020   Procedure: BIOPSY;  Surgeon: CaEloise HarmanDO;  [facility];  Service: Endoscopy;;   COLONOSCOPY  12/2009   Diverticulosis, mild internal hemorrhoids, multiple colon polyps removed (adenomatous). One irregular shaped polyp was tattooed. Next colonoscopy due October 2016.   COLONOSCOPY N/A 11/01/2015   Procedure: COLONOSCOPY;  Surgeon: SaDanie BinderMD;  [facility];  Service: Endoscopy;  Laterality: N/A;  100   COLONOSCOPY WITH PROPOFOL N/A 08/17/2020   Procedure: COLONOSCOPY WITH PROPOFOL;  Surgeon: CaEloise HarmanDO;  [facility];  Service: Endoscopy;  Laterality: N/A;  2:15pm   ESOPHAGOGASTRODUODENOSCOPY N/A 10/08/2012   SLF: Moderate erosive gastritis/duodenitis. Biopsies benign.   ESOPHAGOGASTRODUODENOSCOPY N/A 11/01/2015   Procedure: ESOPHAGOGASTRODUODENOSCOPY (EGD);  Surgeon: SaDanie BinderMD;  [facility];  Service: Endoscopy;  Laterality: N/A;   POLYPECTOMY  08/17/2020  Procedure: POLYPECTOMY;  Surgeon: Eloise Harman, DO;  [facility];  Service: Endoscopy;;    Current Outpatient Medications  Medication Sig Dispense Refill   amLODipine (NORVASC) 5 MG tablet Take 1 tablet (5 mg total) by mouth daily. 90 tablet 1   apixaban (ELIQUIS) 5 MG TABS tablet Take 1 tablet (5 mg total) by mouth  2 (two) times daily. 60 tablet 6   atorvastatin (LIPITOR) 40 MG tablet Take one tablet po daily 90 tablet 1   buPROPion (WELLBUTRIN XL) 150 MG 24 hr tablet TAKE 1 TABLET BY MOUTH EVERY DAY (Patient taking differently: Take 150 mg by mouth daily.) 90 tablet 2   FLUoxetine (PROZAC) 10 MG capsule Take 1 capsule (10 mg total) by mouth daily. 30 capsule 3   glucosamine-chondroitin 500-400 MG tablet Take 2 tablets by mouth every morning.     hydrochlorothiazide (MICROZIDE) 12.5 MG capsule TAKE 1 CAPSULE BY MOUTH EVERY DAY IN THE MORNING 90 capsule 1   metoprolol succinate (TOPROL-XL) 25 MG 24 hr tablet TAKE 1 TABLET (25 MG TOTAL) BY MOUTH DAILY. 90 tablet 1   polyethylene glycol-electrolytes (TRILYTE) 420 g solution Take 4,000 mLs by mouth as directed. 4000 mL 0   triamcinolone cream (KENALOG) 0.1 % Apply twice daily PRN (Patient taking differently: Apply 1 application topically 2 (two) times daily as needed (irritation). Apply twice daily PRN) 30 g 0   No current facility-administered medications for this visit.   Allergies:  Patient has no known allergies.   Social History: The patient  reports that he has been smoking cigarettes. He started smoking about 5 years ago. He has been smoking an average of .15 packs per day. He has never used smokeless tobacco. He reports current alcohol use. He reports that he does not use drugs.   Family History: The patient's family history includes Diabetes in his maternal grandfather and mother; Parkinson's disease in his father.   ROS:  Please see the history of present illness. Otherwise, complete review of systems is positive for none.  All other systems are reviewed and negative.   Physical Exam: VS:  BP 130/76   Pulse 84   Ht '5\' 10"'$  (1.778 m)   Wt 198 lb 9.6 oz (90.1 kg)   SpO2 98%   BMI 28.50 kg/m , BMI Body mass index is 28.5 kg/m.  Wt Readings from Last 3 Encounters:  10/18/20 198 lb 9.6 oz (90.1 kg)  10/11/20 201 lb (91.2 kg)  08/10/20 197 lb  (89.4 kg)    General: Patient appears comfortable at rest. Neck: Supple, no elevated JVP or carotid bruits, no thyromegaly. Lungs: Clear to auscultation, nonlabored breathing at rest. Cardiac: Irregularly irregular rate and rhythm, no S3 or significant systolic murmur, no pericardial rub. Extremities: No pitting edema, distal pulses 2+. Skin: Warm and dry. Musculoskeletal: No kyphosis. Neuropsychiatric: Alert and oriented x3, affect grossly appropriate.  ECG: 10/18/2020 EKG atrial fibrillation with a rate of 88.  Recent Labwork: 10/13/2020: ALT 22; AST 52; BUN 24; Creatinine, Ser 1.53; Hemoglobin 13.1; Platelets 222; Potassium 5.7; Sodium 137     Component Value Date/Time   CHOL 173 10/13/2020 1437   TRIG 61 10/13/2020 1437   HDL 97 10/13/2020 1437   CHOLHDL 1.8 10/13/2020 1437   CHOLHDL 2.4 07/30/2012 1215   VLDL 11 07/30/2012 1215   LDLCALC 64 10/13/2020 1437    Other Studies Reviewed Today:  Echocardiogram 07/29/2020  1. Left ventricular ejection fraction, by estimation, is 60 to 65%. The left  ventricle has normal function. The left ventricle has no regional wall motion abnormalities. Left ventricular diastolic parameters were normal. 2. Right ventricular systolic function is normal. The right ventricular size is normal. 3. Left atrial size was moderately dilated. 4. The mitral valve is normal in structure. Mild mitral valve regurgitation. No evidence of mitral stenosis. 5. The aortic valve has an indeterminant number of cusps. There is mild calcification of the aortic valve. There is mild thickening of the aortic valve. Aortic valve regurgitation is not visualized. No aortic stenosis is present. 6. The inferior vena cava is normal in size with greater than 50% respiratory variability, suggesting right atrial pressure of 3 mmHg.    Colonoscopy 08/17/2020 - Non-bleeding internal hemorrhoids. - Diverticulosis in the entire examined colon. - Four 2 mm polyps in the transverse  colon and in the ascending colon, removed with a cold biopsy forceps. Resected and retrieved. - The examination was otherwise normal. - A tattoo was seen in the sigmoid colon. A post-polypectomy scar was found at the tattoo site. There was no evidence of residual polyp tissue.   Exercise/Lexiscan Myoview 09/10/2015: Blood pressure demonstrated a hypertensive response to exercise. Defect 1: There is a medium defect of mild severity present in the basal inferior, basal inferolateral, mid inferior and apical inferior location. This is a low risk study. The left ventricular ejection fraction is hyperdynamic (>65%). Probable normal perfusion and soft tissue attenuation (diaphragm) No ischemia or definite scar.   Echocardiogram 09/10/2015: - Left ventricle: The cavity size was normal. Wall thickness was    increased in a pattern of mild LVH. Systolic function was    vigorous. The estimated ejection fraction was in the range of 65%    to 70%. Features are consistent with a pseudonormal left    ventricular filling pattern, with concomitant abnormal relaxation    and increased filling pressure (grade 2 diastolic dysfunction).    Abdominal ultrasound 03/25/2020: IMPRESSION: 1. No gallstones. Mild gallbladder wall thickening at 4 mm. This could be seen with cholecystitis and or hypoproteinemic states. No biliary distention. 2. Increased echogenicity of the liver consistent fatty infiltration or hepatocellular disease. No focal hepatic abnormality identified. 3. Increased echogenicity both kidneys consistent with chronic medical renal disease. 4. 1.1 cm simple cyst right kidney. 5. 3.3 cm abdominal aortic aneurysm. This is new from prior study ultrasound of 05/21/2017. Recommend followup by ultrasound in 3 years. This recommendation follows ACR consensus guidelines: White Paper of the ACR Incidental Findings Committee II on Vascular Findings.   Chest CTA 07/05/2020: IMPRESSION: 1. Mild  thoracic aortic aneurysm measuring 3.4 cm in the arch, 3.5 cm in the proximal descending segment. Recommend annual imaging followup by CTA or MRA. This recommendation follows 2010 ACCF/AHA/AATS/ACR/ASA/SCA/SCAI/SIR/STS/SVM Guidelines for the Diagnosis and Management of Patients with Thoracic Aortic Disease. Circulation.2010; [id number]. Aortic aneurysm NOS (ICD10-I71.9)   Aortic Atherosclerosis (ICD10-I70.0) and Emphysema (ICD10-J43.9).   Assessment and Plan:  1. Atrial fibrillation and flutter ([place])   2. Mixed hyperlipidemia   3. Thoracic aortic aneurysm without rupture ([place])   4. Essential hypertension   5. Medication management    1. Atrial fibrillation and flutter ([place]) EKG today atrial fibrillation with a rate of 88.  Please start Eliquis 5 mg p.o. twice daily.  Advised to report any signs and symptoms of bleeding such as blood in stool or urine or black tarry stools.  He verbalizes understanding.  Get CBC and basic metabolic panel in 3 months.  Continue Toprol-XL 25 mg daily.  2. Mixed hyperlipidemia Continue atorvastatin 40 mg p.o. daily.  Recent lipid profile on 10/13/2020: TC 173, HDL 97, TG 61, LDL 64  3. Thoracic aortic aneurysm without rupture (HCC)/AAA Mild thoracic aortic aneurysm measuring 3.5 cm in the arch, 3.5 cm in proximal descending segment.  Recommended annual imaging by CTA or MRA.  Abdominal aortic ultrasound 03/25/2020 demonstrated 3.3 cm abdominal aortic aneurysm.  This was new from prior study in March 2019.  Recommended follow-up in 3 years.  4. Essential hypertension BP well controlled today at 130/76.  Continue Toprol-XL 25 mg daily.  Continue amlodipine 5 mg p.o. daily.  Continue hydrochlorothiazide 12.5 mg p.o. daily.  Medication Adjustments/Labs and Tests Ordered: Current medicines are reviewed at length with the patient today.  Concerns regarding medicines are outlined above.   Disposition: Follow-up with Dr. Domenic Polite or APP 6  months  Signed, Levell July, NP 10/18/2020 10:25 AM    [facility] [place], [place], [place] [address] Phone: [phone]; Fax: [phone]

## 2020-10-18 ENCOUNTER — Ambulatory Visit (INDEPENDENT_AMBULATORY_CARE_PROVIDER_SITE_OTHER): Payer: Medicare Other | Admitting: Family Medicine

## 2020-10-18 ENCOUNTER — Encounter: Payer: Self-pay | Admitting: Family Medicine

## 2020-10-18 ENCOUNTER — Other Ambulatory Visit: Payer: Self-pay

## 2020-10-18 VITALS — BP 130/76 | HR 84 | Ht 70.0 in | Wt 198.6 lb

## 2020-10-18 DIAGNOSIS — I4891 Unspecified atrial fibrillation: Secondary | ICD-10-CM

## 2020-10-18 DIAGNOSIS — I10 Essential (primary) hypertension: Secondary | ICD-10-CM

## 2020-10-18 DIAGNOSIS — I712 Thoracic aortic aneurysm, without rupture, unspecified: Secondary | ICD-10-CM

## 2020-10-18 DIAGNOSIS — Z79899 Other long term (current) drug therapy: Secondary | ICD-10-CM

## 2020-10-18 DIAGNOSIS — E782 Mixed hyperlipidemia: Secondary | ICD-10-CM | POA: Diagnosis not present

## 2020-10-18 DIAGNOSIS — I4892 Unspecified atrial flutter: Secondary | ICD-10-CM | POA: Diagnosis not present

## 2020-10-18 MED ORDER — APIXABAN 5 MG PO TABS: 5.0000 mg | ORAL_TABLET | Freq: Two times a day (BID) | ORAL | 6 refills | Status: DC

## 2020-10-18 NOTE — Patient Instructions (Signed)
Medication Instructions:  Your physician has recommended you make the following change in your medication:  Start eliquis 5 mg twice daily Continue other medications the same  Labwork: BMET & CBC in 3 months at [facility]  Testing/Procedures: none  Follow-Up: Your physician recommends that you schedule a follow-up appointment in: 6 months  Any Other Special Instructions Will Be Listed Below (If Applicable).  If you need a refill on your cardiac medications before your next appointment, please call your pharmacy.

## 2020-10-26 ENCOUNTER — Telehealth: Payer: Self-pay | Admitting: Family Medicine

## 2020-11-04 ENCOUNTER — Other Ambulatory Visit: Payer: Self-pay | Admitting: Family Medicine

## 2020-11-11 ENCOUNTER — Encounter (HOSPITAL_COMMUNITY): Payer: Self-pay

## 2020-11-11 ENCOUNTER — Ambulatory Visit (HOSPITAL_COMMUNITY)
Admission: RE | Admit: 2020-11-11 | Discharge: 2020-11-11 | Disposition: A | Discharge status: Home / Self Care | Payer: Medicare Other | Source: Ambulatory Visit | Attending: Family Medicine | Admitting: Family Medicine

## 2020-11-11 ENCOUNTER — Other Ambulatory Visit: Payer: Self-pay

## 2020-11-11 MED ORDER — BARIUM SULFATE 2.1 % PO SUSP
ORAL | Status: AC
  Filled 2020-11-11: qty 2

## 2020-11-11 MED ORDER — IOHEXOL 350 MG/ML SOLN: 85.0000 mL | Freq: Once | INTRAVENOUS | Status: DC | PRN

## 2020-11-13 ENCOUNTER — Telehealth: Payer: Self-pay

## 2020-11-13 NOTE — Telephone Encounter (Signed)
Outgoing call to patient to see if we can get him scheduled for his AWVI or if he would like to proceed today. Pt has declined AWVI for this year. Pt had no concerns at the end of the call.

## 2020-11-17 ENCOUNTER — Other Ambulatory Visit: Payer: Self-pay

## 2020-11-17 ENCOUNTER — Ambulatory Visit (HOSPITAL_COMMUNITY)
Admission: RE | Admit: 2020-11-17 | Discharge: 2020-11-17 | Disposition: A | Discharge status: Home / Self Care | Payer: Medicare Other | Source: Ambulatory Visit | Attending: Family Medicine | Admitting: Family Medicine

## 2020-11-17 DIAGNOSIS — R109 Unspecified abdominal pain: Secondary | ICD-10-CM | POA: Diagnosis not present

## 2020-11-17 LAB — POCT I-STAT CREATININE: Creatinine, Ser: 1.9 mg/dL — ABNORMAL HIGH (ref 0.61–1.24)

## 2020-11-17 MED ORDER — IOHEXOL 350 MG/ML SOLN
100.0000 mL | Freq: Once | INTRAVENOUS | Status: AC | PRN
  Administered 2020-11-17: 70 mL via INTRAVENOUS

## 2020-11-23 ENCOUNTER — Ambulatory Visit: Payer: Medicare Other | Admitting: Family Medicine

## 2021-01-18 ENCOUNTER — Telehealth: Payer: Self-pay | Admitting: Family Medicine

## 2021-01-18 NOTE — Telephone Encounter (Signed)
Patient states he has follow up with Dr. Wolfgang Phoenix in 2 weeks for a physical and does not feel it necessary to have an AWV with [facility]. mjp,lpn)

## 2021-01-26 ENCOUNTER — Telehealth: Payer: Self-pay | Admitting: Family Medicine

## 2021-01-26 ENCOUNTER — Other Ambulatory Visit: Payer: Self-pay | Admitting: Family Medicine

## 2021-01-26 MED ORDER — MOMETASONE FUROATE 0.1 % EX CREA: TOPICAL_CREAM | CUTANEOUS | 2 refills | Status: DC

## 2021-01-26 NOTE — Telephone Encounter (Signed)
Elocon cream sent to pharmacy and pt is aware

## 2021-01-26 NOTE — Telephone Encounter (Signed)
Elocon 0.1%, 30 g, apply thin amount twice daily as needed not for the face or groin, 2 refills

## 2021-01-26 NOTE — Telephone Encounter (Signed)
Pt has Elocon 0.1% and Halobetasol 0.05% cream on historical list. Please advise. Thank you

## 2021-01-26 NOTE — Telephone Encounter (Signed)
Patient was last seen 9/13. Stated a few years back Dr. Nicki Reaper prescribed a cream for a place on his ankle. Michela Pitcher it worked really well but is now out and wanted a refill.  Please advise.  [facility]  CB#: [phone]

## 2021-02-16 ENCOUNTER — Emergency Department (HOSPITAL_COMMUNITY): Payer: Medicare Other

## 2021-02-16 ENCOUNTER — Inpatient Hospital Stay (HOSPITAL_COMMUNITY)
Admission: EM | Admit: 2021-02-16 | Discharge: 2021-02-22 | DRG: 871 | Disposition: A | Discharge status: Home Health | Payer: Medicare Other | Attending: Family Medicine | Admitting: Family Medicine

## 2021-02-16 ENCOUNTER — Other Ambulatory Visit: Payer: Self-pay

## 2021-02-16 ENCOUNTER — Encounter (HOSPITAL_COMMUNITY): Payer: Self-pay | Admitting: Emergency Medicine

## 2021-02-16 DIAGNOSIS — I129 Hypertensive chronic kidney disease with stage 1 through stage 4 chronic kidney disease, or unspecified chronic kidney disease: Secondary | ICD-10-CM | POA: Diagnosis present

## 2021-02-16 DIAGNOSIS — I25119 Atherosclerotic heart disease of native coronary artery with unspecified angina pectoris: Secondary | ICD-10-CM | POA: Diagnosis present

## 2021-02-16 DIAGNOSIS — I4892 Unspecified atrial flutter: Secondary | ICD-10-CM | POA: Diagnosis present

## 2021-02-16 DIAGNOSIS — N1831 Chronic kidney disease, stage 3a: Secondary | ICD-10-CM | POA: Diagnosis present

## 2021-02-16 DIAGNOSIS — I700 Atherosclerosis of aorta: Secondary | ICD-10-CM | POA: Diagnosis present

## 2021-02-16 DIAGNOSIS — R1310 Dysphagia, unspecified: Secondary | ICD-10-CM

## 2021-02-16 DIAGNOSIS — J440 Chronic obstructive pulmonary disease with acute lower respiratory infection: Secondary | ICD-10-CM | POA: Diagnosis present

## 2021-02-16 DIAGNOSIS — F17200 Nicotine dependence, unspecified, uncomplicated: Secondary | ICD-10-CM | POA: Diagnosis not present

## 2021-02-16 DIAGNOSIS — K31819 Angiodysplasia of stomach and duodenum without bleeding: Secondary | ICD-10-CM | POA: Diagnosis not present

## 2021-02-16 DIAGNOSIS — I671 Cerebral aneurysm, nonruptured: Secondary | ICD-10-CM | POA: Diagnosis present

## 2021-02-16 DIAGNOSIS — Z8673 Personal history of transient ischemic attack (TIA), and cerebral infarction without residual deficits: Secondary | ICD-10-CM | POA: Diagnosis not present

## 2021-02-16 DIAGNOSIS — I714 Abdominal aortic aneurysm, without rupture, unspecified: Secondary | ICD-10-CM | POA: Diagnosis present

## 2021-02-16 DIAGNOSIS — E876 Hypokalemia: Secondary | ICD-10-CM | POA: Diagnosis present

## 2021-02-16 DIAGNOSIS — B3781 Candidal esophagitis: Secondary | ICD-10-CM | POA: Diagnosis not present

## 2021-02-16 DIAGNOSIS — Z833 Family history of diabetes mellitus: Secondary | ICD-10-CM | POA: Diagnosis not present

## 2021-02-16 DIAGNOSIS — I10 Essential (primary) hypertension: Secondary | ICD-10-CM

## 2021-02-16 DIAGNOSIS — R0902 Hypoxemia: Secondary | ICD-10-CM | POA: Diagnosis present

## 2021-02-16 DIAGNOSIS — K2970 Gastritis, unspecified, without bleeding: Secondary | ICD-10-CM

## 2021-02-16 DIAGNOSIS — R630 Anorexia: Secondary | ICD-10-CM | POA: Diagnosis not present

## 2021-02-16 DIAGNOSIS — I77810 Thoracic aortic ectasia: Secondary | ICD-10-CM | POA: Diagnosis present

## 2021-02-16 DIAGNOSIS — F10231 Alcohol dependence with withdrawal delirium: Secondary | ICD-10-CM | POA: Diagnosis present

## 2021-02-16 DIAGNOSIS — R443 Hallucinations, unspecified: Secondary | ICD-10-CM | POA: Diagnosis present

## 2021-02-16 DIAGNOSIS — Z6827 Body mass index (BMI) 27.0-27.9, adult: Secondary | ICD-10-CM

## 2021-02-16 DIAGNOSIS — J189 Pneumonia, unspecified organism: Secondary | ICD-10-CM | POA: Diagnosis present

## 2021-02-16 DIAGNOSIS — R0602 Shortness of breath: Secondary | ICD-10-CM | POA: Diagnosis not present

## 2021-02-16 DIAGNOSIS — Z7901 Long term (current) use of anticoagulants: Secondary | ICD-10-CM

## 2021-02-16 DIAGNOSIS — Z820 Family history of epilepsy and other diseases of the nervous system: Secondary | ICD-10-CM

## 2021-02-16 DIAGNOSIS — N183 Chronic kidney disease, stage 3 unspecified: Secondary | ICD-10-CM | POA: Diagnosis present

## 2021-02-16 DIAGNOSIS — I482 Chronic atrial fibrillation, unspecified: Secondary | ICD-10-CM | POA: Diagnosis not present

## 2021-02-16 DIAGNOSIS — R9431 Abnormal electrocardiogram [ECG] [EKG]: Secondary | ICD-10-CM | POA: Diagnosis not present

## 2021-02-16 DIAGNOSIS — R112 Nausea with vomiting, unspecified: Secondary | ICD-10-CM | POA: Diagnosis not present

## 2021-02-16 DIAGNOSIS — K319 Disease of stomach and duodenum, unspecified: Secondary | ICD-10-CM | POA: Diagnosis present

## 2021-02-16 DIAGNOSIS — E785 Hyperlipidemia, unspecified: Secondary | ICD-10-CM | POA: Diagnosis present

## 2021-02-16 DIAGNOSIS — R634 Abnormal weight loss: Secondary | ICD-10-CM

## 2021-02-16 DIAGNOSIS — Z20822 Contact with and (suspected) exposure to covid-19: Secondary | ICD-10-CM | POA: Diagnosis present

## 2021-02-16 DIAGNOSIS — I4891 Unspecified atrial fibrillation: Secondary | ICD-10-CM | POA: Diagnosis present

## 2021-02-16 DIAGNOSIS — F17210 Nicotine dependence, cigarettes, uncomplicated: Secondary | ICD-10-CM | POA: Diagnosis present

## 2021-02-16 DIAGNOSIS — I2510 Atherosclerotic heart disease of native coronary artery without angina pectoris: Secondary | ICD-10-CM | POA: Diagnosis present

## 2021-02-16 DIAGNOSIS — K209 Esophagitis, unspecified without bleeding: Secondary | ICD-10-CM | POA: Diagnosis not present

## 2021-02-16 DIAGNOSIS — E872 Acidosis, unspecified: Secondary | ICD-10-CM | POA: Diagnosis present

## 2021-02-16 DIAGNOSIS — A419 Sepsis, unspecified organism: Secondary | ICD-10-CM | POA: Diagnosis not present

## 2021-02-16 DIAGNOSIS — F1010 Alcohol abuse, uncomplicated: Secondary | ICD-10-CM | POA: Diagnosis not present

## 2021-02-16 DIAGNOSIS — E782 Mixed hyperlipidemia: Secondary | ICD-10-CM | POA: Diagnosis present

## 2021-02-16 DIAGNOSIS — F10931 Alcohol use, unspecified with withdrawal delirium: Secondary | ICD-10-CM | POA: Diagnosis not present

## 2021-02-16 DIAGNOSIS — Z79899 Other long term (current) drug therapy: Secondary | ICD-10-CM

## 2021-02-16 DIAGNOSIS — J90 Pleural effusion, not elsewhere classified: Secondary | ICD-10-CM | POA: Diagnosis not present

## 2021-02-16 DIAGNOSIS — E7849 Other hyperlipidemia: Secondary | ICD-10-CM | POA: Diagnosis not present

## 2021-02-16 DIAGNOSIS — I4821 Permanent atrial fibrillation: Secondary | ICD-10-CM

## 2021-02-16 DIAGNOSIS — R059 Cough, unspecified: Secondary | ICD-10-CM | POA: Diagnosis not present

## 2021-02-16 LAB — CBC WITH DIFFERENTIAL/PLATELET
Band Neutrophils: 6 %
Basophils Absolute: 0.1 10*3/uL (ref 0.0–0.1)
Basophils Relative: 1 %
Eosinophils Absolute: 0 10*3/uL (ref 0.0–0.5)
Eosinophils Relative: 0 %
HCT: 41.3 % (ref 39.0–52.0)
Hemoglobin: 14 g/dL (ref 13.0–17.0)
Lymphocytes Relative: 13 %
Lymphs Abs: 1.2 10*3/uL (ref 0.7–4.0)
MCH: 36.6 pg — ABNORMAL HIGH (ref 26.0–34.0)
MCHC: 33.9 g/dL (ref 30.0–36.0)
MCV: 108.1 fL — ABNORMAL HIGH (ref 80.0–100.0)
Monocytes Absolute: 0.7 10*3/uL (ref 0.1–1.0)
Monocytes Relative: 7 %
Neutro Abs: 7.5 10*3/uL (ref 1.7–7.7)
Neutrophils Relative %: 73 %
Platelets: 275 10*3/uL (ref 150–400)
RBC: 3.82 MIL/uL — ABNORMAL LOW (ref 4.22–5.81)
RDW: 12.9 % (ref 11.5–15.5)
WBC: 9.5 10*3/uL (ref 4.0–10.5)
nRBC: 0 % (ref 0.0–0.2)

## 2021-02-16 LAB — BRAIN NATRIURETIC PEPTIDE: B Natriuretic Peptide: 252 pg/mL — ABNORMAL HIGH (ref 0.0–100.0)

## 2021-02-16 LAB — PROTIME-INR
INR: 1 (ref 0.8–1.2)
Prothrombin Time: 12.7 seconds (ref 11.4–15.2)

## 2021-02-16 LAB — RESP PANEL BY RT-PCR (FLU A&B, COVID) ARPGX2
Influenza A by PCR: NEGATIVE
Influenza B by PCR: NEGATIVE
SARS Coronavirus 2 by RT PCR: NEGATIVE

## 2021-02-16 LAB — COMPREHENSIVE METABOLIC PANEL
ALT: 30 U/L (ref 0–44)
AST: 47 U/L — ABNORMAL HIGH (ref 15–41)
Albumin: 2.6 g/dL — ABNORMAL LOW (ref 3.5–5.0)
Alkaline Phosphatase: 109 U/L (ref 38–126)
Anion gap: 12 (ref 5–15)
BUN: 15 mg/dL (ref 8–23)
CO2: 38 mmol/L — ABNORMAL HIGH (ref 22–32)
Calcium: 8.7 mg/dL — ABNORMAL LOW (ref 8.9–10.3)
Chloride: 84 mmol/L — ABNORMAL LOW (ref 98–111)
Creatinine, Ser: 1.3 mg/dL — ABNORMAL HIGH (ref 0.61–1.24)
GFR, Estimated: 59 mL/min — ABNORMAL LOW (ref >60)
Glucose, Bld: 109 mg/dL — ABNORMAL HIGH (ref 70–99)
Potassium: 2.4 mmol/L — CL (ref 3.5–5.1)
Sodium: 134 mmol/L — ABNORMAL LOW (ref 135–145)
Total Bilirubin: 0.7 mg/dL (ref 0.3–1.2)
Total Protein: 7.2 g/dL (ref 6.5–8.1)

## 2021-02-16 LAB — LACTIC ACID, PLASMA
Lactic Acid, Venous: 2.2 mmol/L (ref 0.5–1.9)
Lactic Acid, Venous: 2.2 mmol/L (ref 0.5–1.9)
Lactic Acid, Venous: 1.1 mmol/L (ref 0.5–1.9)

## 2021-02-16 LAB — MAGNESIUM: Magnesium: 1.8 mg/dL (ref 1.7–2.4)

## 2021-02-16 MED ORDER — SODIUM CHLORIDE 0.9 % IV SOLN: 500.0000 mg | INTRAVENOUS | Status: DC

## 2021-02-16 MED ORDER — POTASSIUM CHLORIDE CRYS ER 20 MEQ PO TBCR
40.0000 meq | EXTENDED_RELEASE_TABLET | Freq: Every day | ORAL | Status: DC
  Administered 2021-02-16 – 2021-02-20 (×5): 40 meq via ORAL
  Filled 2021-02-16 (×5): qty 2

## 2021-02-16 MED ORDER — IPRATROPIUM-ALBUTEROL 0.5-2.5 (3) MG/3ML IN SOLN
3.0000 mL | Freq: Three times a day (TID) | RESPIRATORY_TRACT | Status: DC
  Administered 2021-02-17 – 2021-02-20 (×10): 3 mL via RESPIRATORY_TRACT
  Filled 2021-02-16 (×9): qty 3

## 2021-02-16 MED ORDER — METOPROLOL SUCCINATE ER 25 MG PO TB24
25.0000 mg | ORAL_TABLET | Freq: Every day | ORAL | Status: DC
  Administered 2021-02-16: 25 mg via ORAL
  Filled 2021-02-16 (×2): qty 1

## 2021-02-16 MED ORDER — ACETAMINOPHEN 325 MG PO TABS
650.0000 mg | ORAL_TABLET | Freq: Four times a day (QID) | ORAL | Status: DC | PRN
  Administered 2021-02-16: 650 mg via ORAL
  Filled 2021-02-16: qty 2

## 2021-02-16 MED ORDER — SODIUM CHLORIDE 0.9 % IV BOLUS
1000.0000 mL | Freq: Once | INTRAVENOUS | Status: AC
  Administered 2021-02-16: 1000 mL via INTRAVENOUS

## 2021-02-16 MED ORDER — ATORVASTATIN CALCIUM 10 MG PO TABS
10.0000 mg | ORAL_TABLET | Freq: Every evening | ORAL | Status: DC
  Administered 2021-02-16 – 2021-02-21 (×6): 10 mg via ORAL
  Filled 2021-02-16 (×7): qty 1

## 2021-02-16 MED ORDER — AMLODIPINE BESYLATE 5 MG PO TABS
5.0000 mg | ORAL_TABLET | Freq: Every day | ORAL | Status: DC
  Administered 2021-02-17 – 2021-02-22 (×6): 5 mg via ORAL
  Filled 2021-02-16 (×6): qty 1

## 2021-02-16 MED ORDER — BUPROPION HCL ER (XL) 150 MG PO TB24
150.0000 mg | ORAL_TABLET | Freq: Every day | ORAL | Status: DC
  Administered 2021-02-16 – 2021-02-22 (×7): 150 mg via ORAL
  Filled 2021-02-16 (×7): qty 1

## 2021-02-16 MED ORDER — ONDANSETRON HCL 4 MG PO TABS: 4.0000 mg | ORAL_TABLET | Freq: Four times a day (QID) | ORAL | Status: DC | PRN

## 2021-02-16 MED ORDER — SODIUM CHLORIDE 0.9 % IV SOLN
2.0000 g | INTRAVENOUS | Status: AC
  Administered 2021-02-16 – 2021-02-20 (×5): 2 g via INTRAVENOUS
  Filled 2021-02-16 (×5): qty 20

## 2021-02-16 MED ORDER — POTASSIUM CHLORIDE IN NACL 20-0.9 MEQ/L-% IV SOLN: INTRAVENOUS | Status: DC

## 2021-02-16 MED ORDER — ACETAMINOPHEN 650 MG RE SUPP: 650.0000 mg | Freq: Four times a day (QID) | RECTAL | Status: DC | PRN

## 2021-02-16 MED ORDER — SODIUM CHLORIDE 0.9 % IV SOLN
500.0000 mg | INTRAVENOUS | Status: AC
  Administered 2021-02-16 – 2021-02-20 (×5): 500 mg via INTRAVENOUS
  Filled 2021-02-16 (×5): qty 5

## 2021-02-16 MED ORDER — NICOTINE 21 MG/24HR TD PT24
21.0000 mg | MEDICATED_PATCH | Freq: Every day | TRANSDERMAL | Status: DC
  Administered 2021-02-16 – 2021-02-22 (×7): 21 mg via TRANSDERMAL
  Filled 2021-02-16 (×7): qty 1

## 2021-02-16 MED ORDER — OXYCODONE HCL 5 MG PO TABS
5.0000 mg | ORAL_TABLET | Freq: Four times a day (QID) | ORAL | Status: DC | PRN
  Administered 2021-02-16: 5 mg via ORAL
  Filled 2021-02-16: qty 1

## 2021-02-16 MED ORDER — ONDANSETRON HCL 4 MG/2ML IJ SOLN: 4.0000 mg | Freq: Four times a day (QID) | INTRAMUSCULAR | Status: DC | PRN

## 2021-02-16 MED ORDER — ALBUTEROL SULFATE (2.5 MG/3ML) 0.083% IN NEBU: 2.5000 mg | INHALATION_SOLUTION | Freq: Four times a day (QID) | RESPIRATORY_TRACT | Status: DC | PRN

## 2021-02-16 MED ORDER — BISACODYL 5 MG PO TBEC: 5.0000 mg | DELAYED_RELEASE_TABLET | Freq: Every day | ORAL | Status: DC | PRN

## 2021-02-16 MED ORDER — ENSURE ENLIVE PO LIQD
237.0000 mL | Freq: Two times a day (BID) | ORAL | Status: DC
  Administered 2021-02-17 – 2021-02-22 (×7): 237 mL via ORAL

## 2021-02-16 MED ORDER — SODIUM CHLORIDE 0.9 % IV SOLN
1.0000 g | Freq: Once | INTRAVENOUS | Status: AC
  Administered 2021-02-16: 1 g via INTRAVENOUS
  Filled 2021-02-16: qty 10

## 2021-02-16 MED ORDER — APIXABAN 5 MG PO TABS
5.0000 mg | ORAL_TABLET | Freq: Two times a day (BID) | ORAL | Status: DC
  Administered 2021-02-16 – 2021-02-19 (×6): 5 mg via ORAL
  Filled 2021-02-16 (×6): qty 1

## 2021-02-16 MED ORDER — GUAIFENESIN ER 600 MG PO TB12
1200.0000 mg | ORAL_TABLET | Freq: Two times a day (BID) | ORAL | Status: DC
  Administered 2021-02-16 – 2021-02-22 (×12): 1200 mg via ORAL
  Filled 2021-02-16 (×12): qty 2

## 2021-02-16 MED ORDER — IPRATROPIUM-ALBUTEROL 0.5-2.5 (3) MG/3ML IN SOLN
3.0000 mL | Freq: Three times a day (TID) | RESPIRATORY_TRACT | Status: DC
  Administered 2021-02-16 (×2): 3 mL via RESPIRATORY_TRACT
  Filled 2021-02-16 (×2): qty 3

## 2021-02-16 MED ORDER — INFLUENZA VAC A&B SA ADJ QUAD 0.5 ML IM PRSY
0.5000 mL | PREFILLED_SYRINGE | INTRAMUSCULAR | Status: DC
  Filled 2021-02-16: qty 0.5

## 2021-02-16 NOTE — ED Provider Notes (Signed)
[facility] Provider Note   CSN: [id number] Arrival date & time: 02/16/21  1123     History Chief Complaint  Patient presents with   Shortness of Breath    Anthony Little is a 70 y.o. male.  Patient presents to ER chief complaint of persistent cough, increased weakness and fever.  He was diagnosed with influenza about 3 weeks ago.  He had a persistent cough since then which family states never really got better but has been getting worse in the last 3 to 4 days.  Noted to have a fever yesterday of 100.8.  No episodes of vomiting or diarrhea.  Complaining of generalized malaise difficulty getting up and doing anything.  Denies any headache or chest pain or abdominal pain.      Past Medical History:  Diagnosis Date   Abdominal aortic aneurysm (AAA)    Atrial fibrillation and flutter (HCC)    CKD (chronic kidney disease) stage 3, GFR 30-59 ml/min (HCC)    CVA (cerebral infarction)    Essential hypertension    Hyperlipidemia    Thoracic aortic aneurysm     Patient Active Problem List   Diagnosis Date Noted   Purpura senilis ([place]) 03/18/2020   Coronary artery disease involving native heart without angina pectoris 07/14/2019   Pulmonary nodule 07/14/2019   Ectatic thoracic aorta ([place]) 07/14/2019   Thoracic aorta atherosclerosis ([place]) 03/07/2016   Hyperlipidemia 01/14/2016   History of colonic polyps 10/21/2015   HTN (hypertension) 06/08/2014   Brain aneurysm 09/04/2013   Alcohol abuse 09/04/2013   Early satiety 10/02/2012   Abnormal weight loss 10/02/2012   Abdominal pain, chronic, right upper quadrant 10/02/2012    Past Surgical History:  Procedure Laterality Date   BIOPSY  08/17/2020   Procedure: BIOPSY;  Surgeon: Eloise Harman, DO;  [facility];  Service: Endoscopy;;   COLONOSCOPY  12/2009   Diverticulosis, mild internal hemorrhoids, multiple colon polyps removed (adenomatous). One irregular shaped polyp was tattooed. Next  colonoscopy due October 2016.   COLONOSCOPY N/A 11/01/2015   Procedure: COLONOSCOPY;  Surgeon: Danie Binder, MD;  [facility];  Service: Endoscopy;  Laterality: N/A;  100   COLONOSCOPY WITH PROPOFOL N/A 08/17/2020   Procedure: COLONOSCOPY WITH PROPOFOL;  Surgeon: Eloise Harman, DO;  [facility];  Service: Endoscopy;  Laterality: N/A;  2:15pm   ESOPHAGOGASTRODUODENOSCOPY N/A 10/08/2012   SLF: Moderate erosive gastritis/duodenitis. Biopsies benign.   ESOPHAGOGASTRODUODENOSCOPY N/A 11/01/2015   Procedure: ESOPHAGOGASTRODUODENOSCOPY (EGD);  Surgeon: Danie Binder, MD;  [facility];  Service: Endoscopy;  Laterality: N/A;   POLYPECTOMY  08/17/2020   Procedure: POLYPECTOMY;  Surgeon: Eloise Harman, DO;  [facility];  Service: Endoscopy;;       Family History  Problem Relation Age of Onset   Diabetes Mother    Diabetes Maternal Grandfather    Parkinson's disease Father    Ulcers Neg Hx    Colon cancer Neg Hx     Social History   Tobacco Use   Smoking status: Every Day    Packs/day: 0.15    Types: Cigarettes    Start date: 10/04/2015   Smokeless tobacco: Never   Tobacco comments:    he would like to but he knows it is hard   Vaping Use   Vaping Use: Never used  Substance Use Topics   Alcohol use: Yes    Alcohol/week: 4.0 standard drinks    Types: 4 Standard  drinks or equivalent per week   Drug use: No    Home Medications Prior to Admission medications   Medication Sig Start Date End Date Taking? Authorizing Provider  amLODipine (NORVASC) 5 MG tablet Take 5 mg by mouth daily. 10/21/18  Yes [provider]  apixaban (ELIQUIS) 5 MG TABS tablet Take 1 tablet (5 mg total) by mouth 2 (two) times daily. 10/18/20  Yes Verta Ellen., NP  atorvastatin (LIPITOR) 10 MG tablet Take 10 mg by mouth daily. 12/02/18  Yes [provider]  buPROPion (WELLBUTRIN XL) 150 MG 24 hr tablet TAKE 1 TABLET BY MOUTH EVERY DAY 01/26/21   Yes Luking, Elayne Snare, MD  glucosamine-chondroitin 500-400 MG tablet Take 2 tablets by mouth every morning.   Yes [provider]  hydrochlorothiazide (MICROZIDE) 12.5 MG capsule TAKE 1 CAPSULE BY MOUTH EVERY DAY IN THE MORNING 09/23/20  Yes Luking, Scott A, MD  metoprolol succinate (TOPROL-XL) 25 MG 24 hr tablet TAKE 1 TABLET (25 MG TOTAL) BY MOUTH DAILY. 09/23/20  Yes Luking, Scott A, MD  mometasone (ELOCON) 0.1 % cream Apply thin amount to affected area BID prn. Not for face or groin Patient taking differently: Apply 1 application topically 2 (two) times daily as needed (irritation). Not for face or groin 01/26/21  Yes Luking, Scott A, MD  potassium chloride (KLOR-CON) 10 MEQ tablet Take 10 mEq by mouth daily. 01/19/20  Yes [provider]  triamcinolone cream (KENALOG) 0.1 % Apply twice daily PRN Patient taking differently: Apply 1 application topically 2 (two) times daily as needed (irritation). Apply twice daily PRN 08/09/18  Yes Luking, Scott A, MD  FLUoxetine (PROZAC) 10 MG capsule TAKE 1 CAPSULE BY MOUTH EVERY DAY Patient not taking: Reported on 02/16/2021 11/05/20   Kathyrn Drown, MD  polyethylene glycol-electrolytes (TRILYTE) 420 g solution Take 4,000 mLs by mouth as directed. Patient not taking: Reported on 02/16/2021 08/04/20   Eloise Harman, DO    Allergies    Patient has no known allergies.  Review of Systems   Review of Systems  Constitutional:  Positive for fever.  HENT:  Negative for ear pain and sore throat.   Eyes:  Negative for pain.  Respiratory:  Positive for cough and shortness of breath.   Cardiovascular:  Negative for chest pain.  Gastrointestinal:  Negative for abdominal pain.  Genitourinary:  Negative for flank pain.  Musculoskeletal:  Negative for back pain.  Skin:  Negative for color change and rash.  Neurological:  Negative for syncope.  All other systems reviewed and are negative.  Physical Exam Updated Vital Signs BP (!) 158/103    Pulse (!) 103   Temp 98.2 F (36.8 C) (Oral)   Resp (!) 29   Ht 5\' 10"  (1.778 m)   Wt 88.5 kg   SpO2 99%   BMI 27.98 kg/m   Physical Exam Constitutional:      Appearance: He is well-developed.  HENT:     Head: Normocephalic.     Nose: Nose normal.  Eyes:     Extraocular Movements: Extraocular movements intact.  Cardiovascular:     Rate and Rhythm: Tachycardia present.  Pulmonary:     Effort: Pulmonary effort is normal. Tachypnea present.  Skin:    Coloration: Skin is not jaundiced.  Neurological:     Mental Status: He is alert. Mental status is at baseline.    ED Results / Procedures / Treatments   Labs (all labs ordered are listed, but only abnormal  results are displayed) Labs Reviewed  COMPREHENSIVE METABOLIC PANEL - Abnormal; Notable for the following components:      Result Value   Sodium 134 (*)    Potassium 2.4 (*)    Chloride 84 (*)    CO2 38 (*)    Glucose, Bld 109 (*)    Creatinine, Ser 1.30 (*)    Calcium 8.7 (*)    Albumin 2.6 (*)    AST 47 (*)    GFR, Estimated 59 (*)    All other components within normal limits  LACTIC ACID, PLASMA - Abnormal; Notable for the following components:   Lactic Acid, Venous 2.2 (*)    All other components within normal limits  CBC WITH DIFFERENTIAL/PLATELET - Abnormal; Notable for the following components:   RBC 3.82 (*)    MCV 108.1 (*)    MCH 36.6 (*)    All other components within normal limits  BRAIN NATRIURETIC PEPTIDE - Abnormal; Notable for the following components:   B Natriuretic Peptide 252.0 (*)    All other components within normal limits  CULTURE, BLOOD (ROUTINE X 2)  CULTURE, BLOOD (ROUTINE X 2)  RESP PANEL BY RT-PCR (FLU A&B, COVID) ARPGX2  PROTIME-INR  LACTIC ACID, PLASMA  URINALYSIS, ROUTINE W REFLEX MICROSCOPIC    EKG EKG Interpretation  Date/Time:  Wednesday February 16 2021 11:37:42 EST Ventricular Rate:  115 PR Interval:    QRS Duration: 98 QT Interval:  354 QTC Calculation: 490 R  Axis:   55 Text Interpretation: Atrial fibrillation Borderline repolarization abnormality Borderline prolonged QT interval Confirmed by Thamas Jaegers ([id number]) on 02/16/2021 1:38:41 PM  Radiology DG Chest Port 1 View  Result Date: 02/16/2021 CLINICAL DATA:  Congestion and shortness of breath. Suspected sepsis. EXAM: PORTABLE CHEST 1 VIEW COMPARISON:  05/21/2020 FINDINGS: Numerous leads and wires project over the chest. Midline trachea. Normal heart size. Atherosclerosis in the transverse aorta. Mild to moderate right hemidiaphragm elevation. No pleural effusion or pneumothorax. Interstitial coarsening, related to COPD/chronic bronchitis. More focal opacities within the left lower and inferior right upper lobes. IMPRESSION: Development of left lower and right upper lobe pulmonary opacities, infection versus less likely aspiration. Underlying COPD/chronic bronchitis. Aortic Atherosclerosis (ICD10-I70.0). Electronically Signed   By: Abigail Miyamoto M.D.   On: 02/16/2021 12:37    Procedures .Critical Care Performed by: Luna Fuse, MD Authorized by: Luna Fuse, MD   Critical care provider statement:    Critical care time (minutes):  40   Critical care time was exclusive of:  Separately billable procedures and treating other patients and teaching time   Critical care was necessary to treat or prevent imminent or life-threatening deterioration of the following conditions:  Respiratory failure   Medications Ordered in ED Medications  cefTRIAXone (ROCEPHIN) 1 g in sodium chloride 0.9 % 100 mL IVPB (1 g Intravenous New Bag/Given 02/16/21 1352)  azithromycin (ZITHROMAX) 500 mg in sodium chloride 0.9 % 250 mL IVPB (500 mg Intravenous New Bag/Given 02/16/21 1351)  sodium chloride 0.9 % bolus 1,000 mL (0 mLs Intravenous Stopped 02/16/21 1352)    ED Course  I have reviewed the triage vital signs and the nursing notes.  Pertinent labs & imaging results that were available during my care of the patient were  reviewed by me and considered in my medical decision making (see chart for details).    MDM Rules/Calculators/A&P  Patient presents with elevated temperature, tachycardia and hypoxemia which is new for him.  Placed on 2 L nasal cannula doing well. Labs show elevated lactic acid white count otherwise normal.  Chest x-ray concerning for developing pneumonia.  Given clinical picture patient had blood culture sent and treated with Rocephin and azithromycin.  Given a liter bolus of IV fluid resuscitation.  Will be brought into the hospitalist team for diagnosis of pneumonia.  Final Clinical Impression(s) / ED Diagnoses Final diagnoses:  Community acquired pneumonia, unspecified laterality  Hypoxemia    Rx / DC Orders ED Discharge Orders     None        Luna Fuse, MD 02/16/21 1401

## 2021-02-16 NOTE — Progress Notes (Signed)
   02/16/21 1559  Assess: MEWS Score  Temp (!) 100.6 F (38.1 C)  BP (!) 144/83  Pulse Rate (!) 111  ECG Heart Rate (!) 114  Resp 20  SpO2 100 %  O2 Device Nasal Cannula  Patient Activity (if Appropriate) In bed  O2 Flow Rate (L/min) 2 L/min  Assess: MEWS Score  MEWS Temp 1  MEWS Systolic 0  MEWS Pulse 2  MEWS RR 0  MEWS LOC 0  MEWS Score 3  MEWS Score Color Yellow  Assess: if the MEWS score is Yellow or Red  Were vital signs taken at a resting state? Yes  Focused Assessment No change from prior assessment  Early Detection of Sepsis Score *See Row Information* Medium  MEWS guidelines implemented *See Row Information* No, previously yellow, continue vital signs every 4 hours  Treat  MEWS Interventions Administered scheduled meds/treatments  Pain Scale 0-10  Pain Score 0  Take Vital Signs  Increase Vital Sign Frequency  Yellow: Q 2hr X 2 then Q 4hr X 2, if remains yellow, continue Q 4hrs  Escalate  MEWS: Escalate Yellow: discuss with charge nurse/RN and consider discussing with provider and RRT  Notify: Charge Nurse/RN  Name of Charge Nurse/RN Notified Mable Fill, RN  Date Charge Nurse/RN Notified 02/16/21  Time Charge Nurse/RN Notified 1647  Notify: Provider  Provider Sherron Ales, MD  Date Provider Notified 02/16/21  Time Provider Notified 1649  Notification Type Page  Notification Reason Other (Comment)  Provider response No new orders  Date of Provider Response 02/16/21  Time of Provider Response 1649  Document  Patient Outcome Stabilized after interventions

## 2021-02-16 NOTE — Progress Notes (Signed)
Date and time results received: 02/16/21 1931  Test: lab Critical Value: Anaerobic gram + cocci  Name of Provider Notified: Carlynn Purl MD   Orders Received? Or Actions Taken?: Actions Taken: MD on call notified

## 2021-02-16 NOTE — Progress Notes (Deleted)
   02/16/21 1559  Assess: MEWS Score  Temp (!) 100.6 F (38.1 C)  BP (!) 144/83  Pulse Rate (!) 111  ECG Heart Rate (!) 114  Resp 20  SpO2 100 %  O2 Device Nasal Cannula  Patient Activity (if Appropriate) In bed  O2 Flow Rate (L/min) 2 L/min  Assess: if the MEWS score is Yellow or Red  Were vital signs taken at a resting state? Yes  Focused Assessment No change from prior assessment  Early Detection of Sepsis Score *See Row Information* High  MEWS guidelines implemented *See Row Information* No, previously yellow, continue vital signs every 4 hours  Treat  MEWS Interventions Administered scheduled meds/treatments  Pain Scale 0-10  Pain Score 0  Take Vital Signs  Increase Vital Sign Frequency  Yellow: Q 2hr X 2 then Q 4hr X 2, if remains yellow, continue Q 4hrs  Escalate  MEWS: Escalate Yellow: discuss with charge nurse/RN and consider discussing with provider and RRT  Notify: Charge Nurse/RN  Name of Charge Nurse/RN Notified Mable Fill, RN  Date Charge Nurse/RN Notified 02/16/21  Time Charge Nurse/RN Notified 1647  Notify: Provider  Provider Sherron Ales, MD  Date Provider Notified 02/16/21  Time Provider Notified 1649  Notification Type Page  Notification Reason Other (Comment)  Provider response No new orders  Date of Provider Response 02/16/21  Time of Provider Response 1649  Document  Patient Outcome Stabilized after interventions

## 2021-02-16 NOTE — H&P (Signed)
History and Physical  [facility]  Anthony Little SWN:[id number] DOB: 1950/06/17 DOA: 02/16/2021  PCP: Kathyrn Drown, MD  Patient coming from: Home  Level of care: Telemetry  I have personally briefly reviewed patient's old medical records in [place]  Chief Complaint: SOB   HPI: Anthony Little is a 70 y.o. male with medical history significant for chronic daily smoker, COPD, atrial flutter/atrial fibrillation on apixaban, AAA, stage 3a CKD, hyperlipidemia, prior CVA, recently recovered from influenza A infection from 3 weeks ago presents with progressive shortness of breath, cough, malaise, weakness and he has taken 2 negative covid tests at home.  He presents because symptoms continue to worsen.  He has had fever and chills at home.  He has had nausea but no emesis and no diarrhea.  He denies chest pain.  He has palpitations.  He has no vision changes.  He was hypoxic on arrival down to 88% on room air and placed on 2L/min nasal cannula with improvement in oxygen saturation to 95%.  He reportedly does not use oxygen at home.  His labs were notable for an elevated lactic acid of 2.2.  He was noted to have a potassium of 2.4 and glucose of 109.  His Na was 134.  His WBC was 9.5.  Creatinine 1.30.  Sars 2 coronavirus was negative and influenza A and B was negative.  CXR was notable for infiltrates in LLL and RUL.  He was started on sepsis protocol and IV antibiotics and admission was requested.     Review of Systems: Review of Systems  Constitutional:  Positive for chills, fever and malaise/fatigue. Negative for diaphoresis.  HENT:  Positive for congestion and sore throat. Negative for ear pain and hearing loss.   Eyes: Negative.   Respiratory:  Positive for cough, sputum production, shortness of breath and wheezing.   Cardiovascular:  Positive for palpitations. Negative for chest pain and leg swelling.  Gastrointestinal:  Negative for abdominal pain, blood in stool,  constipation, diarrhea, heartburn, nausea and vomiting.  Genitourinary: Negative.   Musculoskeletal:  Positive for myalgias.  Skin: Negative.   Neurological:  Positive for weakness. Negative for dizziness, tingling, tremors and headaches.  Endo/Heme/Allergies: Negative.   Psychiatric/Behavioral: Negative.    All other systems reviewed and are negative.   Past Medical History:  Diagnosis Date   Abdominal aortic aneurysm (AAA)    Atrial fibrillation and flutter (HCC)    CKD (chronic kidney disease) stage 3, GFR 30-59 ml/min (HCC)    CVA (cerebral infarction)    Essential hypertension    Hyperlipidemia    Thoracic aortic aneurysm     Past Surgical History:  Procedure Laterality Date   BIOPSY  08/17/2020   Procedure: BIOPSY;  Surgeon: Eloise Harman, DO;  [facility];  Service: Endoscopy;;   COLONOSCOPY  12/2009   Diverticulosis, mild internal hemorrhoids, multiple colon polyps removed (adenomatous). One irregular shaped polyp was tattooed. Next colonoscopy due October 2016.   COLONOSCOPY N/A 11/01/2015   Procedure: COLONOSCOPY;  Surgeon: Danie Binder, MD;  [facility];  Service: Endoscopy;  Laterality: N/A;  100   COLONOSCOPY WITH PROPOFOL N/A 08/17/2020   Procedure: COLONOSCOPY WITH PROPOFOL;  Surgeon: Eloise Harman, DO;  [facility];  Service: Endoscopy;  Laterality: N/A;  2:15pm   ESOPHAGOGASTRODUODENOSCOPY N/A 10/08/2012   SLF: Moderate erosive gastritis/duodenitis. Biopsies benign.   ESOPHAGOGASTRODUODENOSCOPY N/A 11/01/2015   Procedure: ESOPHAGOGASTRODUODENOSCOPY (EGD);  Surgeon: Danie Binder,  MD;  [facility];  Service: Endoscopy;  Laterality: N/A;   POLYPECTOMY  08/17/2020   Procedure: POLYPECTOMY;  Surgeon: Eloise Harman, DO;  [facility];  Service: Endoscopy;;     reports that he has been smoking cigarettes. He started smoking about 5 years ago. He has been smoking an average of .15 packs per day. He has  never used smokeless tobacco. He reports current alcohol use of about 4.0 standard drinks per week. He reports that he does not use drugs.  No Known Allergies  Family History  Problem Relation Age of Onset   Diabetes Mother    Diabetes Maternal Grandfather    Parkinson's disease Father    Ulcers Neg Hx    Colon cancer Neg Hx     Prior to Admission medications   Medication Sig Start Date End Date Taking? Authorizing Provider  amLODipine (NORVASC) 5 MG tablet Take 5 mg by mouth daily. 10/21/18  Yes [provider]  apixaban (ELIQUIS) 5 MG TABS tablet Take 1 tablet (5 mg total) by mouth 2 (two) times daily. 10/18/20  Yes Verta Ellen., NP  atorvastatin (LIPITOR) 10 MG tablet Take 10 mg by mouth daily. 12/02/18  Yes [provider]  buPROPion (WELLBUTRIN XL) 150 MG 24 hr tablet TAKE 1 TABLET BY MOUTH EVERY DAY 01/26/21  Yes Luking, Elayne Snare, MD  glucosamine-chondroitin 500-400 MG tablet Take 2 tablets by mouth every morning.   Yes [provider]  hydrochlorothiazide (MICROZIDE) 12.5 MG capsule TAKE 1 CAPSULE BY MOUTH EVERY DAY IN THE MORNING 09/23/20  Yes Luking, Scott A, MD  metoprolol succinate (TOPROL-XL) 25 MG 24 hr tablet TAKE 1 TABLET (25 MG TOTAL) BY MOUTH DAILY. 09/23/20  Yes Luking, Scott A, MD  mometasone (ELOCON) 0.1 % cream Apply thin amount to affected area BID prn. Not for face or groin Patient taking differently: Apply 1 application topically 2 (two) times daily as needed (irritation). Not for face or groin 01/26/21  Yes Luking, Scott A, MD  potassium chloride (KLOR-CON) 10 MEQ tablet Take 10 mEq by mouth daily. 01/19/20  Yes [provider]  triamcinolone cream (KENALOG) 0.1 % Apply twice daily PRN Patient taking differently: Apply 1 application topically 2 (two) times daily as needed (irritation). Apply twice daily PRN 08/09/18  Yes Kathyrn Drown, MD    Physical Exam: Vitals:   02/16/21 1141 02/16/21 1209 02/16/21 1230 02/16/21 1300   BP:  (!) 141/82 (!) 131/97 (!) 158/103  Pulse:  (!) 55 64 (!) 103  Resp:  (!) 26 (!) 31 (!) 29  Temp:      TempSrc:      SpO2:  93% (!) 87% 99%  Weight: 88.5 kg     Height: 5\' 10"  (1.778 m)       Constitutional: elderly frail male, appears moderately distressed, Eyes: PERRL, lids and conjunctivae normal ENMT: Mucous membranes are moist. Posterior pharynx clear of any exudate or lesions.  Normal dentition.  Neck: normal, supple, no masses, no thyromegaly Respiratory: diminished BS RUL, LLL, rare expiratory wheezing, no crackles. Moderate increased work of breathing.  No accessory muscle use.  Cardiovascular: normal s1, s2 sounds, no murmurs / rubs / gallops. No extremity edema. 2+ pedal pulses. No carotid bruits.  Abdomen: no tenderness, no masses palpated. No hepatosplenomegaly. Bowel sounds positive.  Musculoskeletal: no clubbing / cyanosis. No joint deformity upper and lower extremities. Good ROM, no contractures. Normal muscle tone.  Skin: no rashes, lesions, ulcers.  No induration Neurologic: CN 2-12 grossly intact. Sensation intact, DTR normal. Strength 5/5 in all 4.  Psychiatric: Normal judgment and insight. Alert and oriented x 3. Normal mood.   Labs on Admission: I have personally reviewed following labs and imaging studies  CBC: Recent Labs  Lab 02/16/21 1208  WBC 9.5  NEUTROABS 7.5  HGB 14.0  HCT 41.3  MCV 108.1*  PLT 161   Basic Metabolic Panel: Recent Labs  Lab 02/16/21 1208  NA 134*  K 2.4*  CL 84*  CO2 38*  GLUCOSE 109*  BUN 15  CREATININE 1.30*  CALCIUM 8.7*   GFR: Estimated Creatinine Clearance: 60.1 mL/min (A) (by C-G formula based on SCr of 1.3 mg/dL (H)). Liver Function Tests: Recent Labs  Lab 02/16/21 1208  AST 47*  ALT 30  ALKPHOS 109  BILITOT 0.7  PROT 7.2  ALBUMIN 2.6*   No results for input(s): LIPASE, AMYLASE in the last 168 hours. No results for input(s): AMMONIA in the last 168 hours. Coagulation Profile: Recent Labs  Lab  02/16/21 1208  INR 1.0   Cardiac Enzymes: No results for input(s): CKTOTAL, CKMB, CKMBINDEX, TROPONINI in the last 168 hours. BNP (last 3 results) No results for input(s): PROBNP in the last 8760 hours. HbA1C: No results for input(s): HGBA1C in the last 72 hours. CBG: No results for input(s): GLUCAP in the last 168 hours. Lipid Profile: No results for input(s): CHOL, HDL, LDLCALC, TRIG, CHOLHDL, LDLDIRECT in the last 72 hours. Thyroid Function Tests: No results for input(s): TSH, T4TOTAL, FREET4, T3FREE, THYROIDAB in the last 72 hours. Anemia Panel: No results for input(s): VITAMINB12, FOLATE, FERRITIN, TIBC, IRON, RETICCTPCT in the last 72 hours. Urine analysis:    Component Value Date/Time   APPEARANCEUR Clear 03/18/2020 1158   GLUCOSEU Negative 03/18/2020 1158   BILIRUBINUR Negative 03/18/2020 1158   PROTEINUR 1+ (A) 03/18/2020 1158   NITRITE Negative 03/18/2020 1158   LEUKOCYTESUR Trace (A) 03/18/2020 1158    Radiological Exams on Admission: DG Chest Port 1 View  Result Date: 02/16/2021 CLINICAL DATA:  Congestion and shortness of breath. Suspected sepsis. EXAM: PORTABLE CHEST 1 VIEW COMPARISON:  05/21/2020 FINDINGS: Numerous leads and wires project over the chest. Midline trachea. Normal heart size. Atherosclerosis in the transverse aorta. Mild to moderate right hemidiaphragm elevation. No pleural effusion or pneumothorax. Interstitial coarsening, related to COPD/chronic bronchitis. More focal opacities within the left lower and inferior right upper lobes. IMPRESSION: Development of left lower and right upper lobe pulmonary opacities, infection versus less likely aspiration. Underlying COPD/chronic bronchitis. Aortic Atherosclerosis (ICD10-I70.0). Electronically Signed   By: Abigail Miyamoto M.D.   On: 02/16/2021 12:37    EKG: Independently reviewed. Atrial fibrillation   Assessment/Plan Principal Problem:   Community acquired pneumonia Active Problems:   Sepsis ([place])   Brain  aneurysm   Alcohol abuse   Essential hypertension   Hyperlipidemia   Thoracic aorta atherosclerosis ([place])   Coronary artery disease involving native heart without angina pectoris   Atrial fibrillation and flutter (HCC)   CKD (chronic kidney disease) stage 3, GFR 30-59 ml/min (HCC)   Abdominal aortic aneurysm (AAA)   Hypokalemia   Lactic acidosis   Sepsis secondary to pneumonia  - continue sepsis protocol - follow lactic acid to resolution - IV antibiotics - follow blood cultures and sputum culture  Community Acquired Pneumonia - likely a postviral pneumonia given recent influenza infection  - continue IV antibiotics and supportive measures - follow blood cultures  Hypokalemia - from HCTZ  -  Hold HCTZ for now - oral potassium replacement ordered - check Magnesium and replace as needed  Atrial fibrillation - resume metoprolol XL - resume apixaban for full anticoagulation   Hyperlipidemia - resume home atorvastatin   Tobacco use - nicotine patch ordered while in hospital - counsel on tobacco cessation     DVT prophylaxis: apixaban   Code Status: full   Family Communication: bedside update   Disposition Plan: home   Consults called:   Admission status: inpatient   Level of care: Telemetry Irwin Brakeman MD [facility] How to contact the [facility] Attending or Consulting provider [place] or covering provider during after hours [place], for this patient?  Check the care team in [facility] and look for a) attending/consulting [facility] provider listed and b) the [facility] team listed Log into [email or web address] and use [place]'s universal password to access. If you do not have the password, please contact the hospital operator. Locate the [facility] provider you are looking for under [facility] and page to a number that you can be directly reached. If you still have difficulty reaching the provider, please page the [facility] (Director on Call) for the Hospitalists listed on amion for  assistance.   If 7PM-7AM, please contact night-coverage [email or web address] Password [email or web address]  02/16/2021, 2:25 PM

## 2021-02-16 NOTE — Plan of Care (Signed)

## 2021-02-16 NOTE — ED Triage Notes (Signed)
PT arrives via POV with c/o of SOB, cough, and weakness that has progressively gotten worse over past 3 weeks. States he was dx with flu prior to thanksgiving but keeps getting worse. 2 home covid tests negative.

## 2021-02-16 NOTE — Progress Notes (Signed)
Patient arrived to the floor approx 1600. Alert and oriented x4. 2 L n/c due to destating in the 80's. Home meds given, and fluids hanging. Patient has a slight temp. So tylenol given. Currently running a yellow mews d/t temp, HR, and BP. Will reevaluate.

## 2021-02-17 DIAGNOSIS — I671 Cerebral aneurysm, nonruptured: Secondary | ICD-10-CM

## 2021-02-17 DIAGNOSIS — F1010 Alcohol abuse, uncomplicated: Secondary | ICD-10-CM

## 2021-02-17 DIAGNOSIS — E7849 Other hyperlipidemia: Secondary | ICD-10-CM

## 2021-02-17 LAB — CBC WITH DIFFERENTIAL/PLATELET
Basophils Absolute: 0 10*3/uL (ref 0.0–0.1)
Basophils Relative: 0 %
Eosinophils Absolute: 0 10*3/uL (ref 0.0–0.5)
Eosinophils Relative: 0 %
HCT: 35.6 % — ABNORMAL LOW (ref 39.0–52.0)
Hemoglobin: 11.9 g/dL — ABNORMAL LOW (ref 13.0–17.0)
Lymphocytes Relative: 16 %
Lymphs Abs: 1.2 10*3/uL (ref 0.7–4.0)
MCH: 36.8 pg — ABNORMAL HIGH (ref 26.0–34.0)
MCHC: 33.4 g/dL (ref 30.0–36.0)
MCV: 110.2 fL — ABNORMAL HIGH (ref 80.0–100.0)
Monocytes Absolute: 0.4 10*3/uL (ref 0.1–1.0)
Monocytes Relative: 5 %
Neutro Abs: 6.1 10*3/uL (ref 1.7–7.7)
Neutrophils Relative %: 79 %
Platelets: 240 10*3/uL (ref 150–400)
RBC: 3.23 MIL/uL — ABNORMAL LOW (ref 4.22–5.81)
RDW: 13.1 % (ref 11.5–15.5)
WBC: 7.7 10*3/uL (ref 4.0–10.5)
nRBC: 0 % (ref 0.0–0.2)

## 2021-02-17 LAB — COMPREHENSIVE METABOLIC PANEL
ALT: 21 U/L (ref 0–44)
AST: 31 U/L (ref 15–41)
Albumin: 2.1 g/dL — ABNORMAL LOW (ref 3.5–5.0)
Alkaline Phosphatase: 84 U/L (ref 38–126)
Anion gap: 8 (ref 5–15)
BUN: 15 mg/dL (ref 8–23)
CO2: 33 mmol/L — ABNORMAL HIGH (ref 22–32)
Calcium: 8.1 mg/dL — ABNORMAL LOW (ref 8.9–10.3)
Chloride: 97 mmol/L — ABNORMAL LOW (ref 98–111)
Creatinine, Ser: 1.17 mg/dL (ref 0.61–1.24)
GFR, Estimated: >60 mL/min (ref >60)
Glucose, Bld: 87 mg/dL (ref 70–99)
Potassium: 3.2 mmol/L — ABNORMAL LOW (ref 3.5–5.1)
Sodium: 138 mmol/L (ref 135–145)
Total Bilirubin: 0.7 mg/dL (ref 0.3–1.2)
Total Protein: 5.7 g/dL — ABNORMAL LOW (ref 6.5–8.1)

## 2021-02-17 LAB — URINALYSIS, ROUTINE W REFLEX MICROSCOPIC
Glucose, UA: NEGATIVE mg/dL
Ketones, ur: NEGATIVE mg/dL
Leukocytes,Ua: NEGATIVE
Nitrite: NEGATIVE
Protein, ur: 30 mg/dL — AB
Specific Gravity, Urine: 1.015 (ref 1.005–1.030)
pH: 5.5 (ref 5.0–8.0)

## 2021-02-17 LAB — BLOOD CULTURE ID PANEL (REFLEXED) - BCID2

## 2021-02-17 LAB — URINALYSIS, MICROSCOPIC (REFLEX): Bacteria, UA: NONE SEEN

## 2021-02-17 LAB — MAGNESIUM: Magnesium: 1.6 mg/dL — ABNORMAL LOW (ref 1.7–2.4)

## 2021-02-17 LAB — HIV ANTIBODY (ROUTINE TESTING W REFLEX): HIV Screen 4th Generation wRfx: NONREACTIVE

## 2021-02-17 LAB — STREP PNEUMONIAE URINARY ANTIGEN: Strep Pneumo Urinary Antigen: NEGATIVE

## 2021-02-17 MED ORDER — THIAMINE HCL 100 MG/ML IJ SOLN
100.0000 mg | Freq: Every day | INTRAMUSCULAR | Status: DC
  Filled 2021-02-17: qty 2

## 2021-02-17 MED ORDER — POTASSIUM CHLORIDE CRYS ER 20 MEQ PO TBCR
40.0000 meq | EXTENDED_RELEASE_TABLET | Freq: Once | ORAL | Status: AC
  Administered 2021-02-17: 40 meq via ORAL
  Filled 2021-02-17: qty 2

## 2021-02-17 MED ORDER — METOPROLOL SUCCINATE ER 25 MG PO TB24
37.5000 mg | ORAL_TABLET | Freq: Every day | ORAL | Status: DC
  Administered 2021-02-17 – 2021-02-18 (×2): 37.5 mg via ORAL
  Filled 2021-02-17 (×2): qty 2

## 2021-02-17 MED ORDER — LORAZEPAM 2 MG/ML IJ SOLN
1.0000 mg | INTRAMUSCULAR | Status: DC | PRN
  Administered 2021-02-17: 3 mg via INTRAVENOUS
  Filled 2021-02-17: qty 2

## 2021-02-17 MED ORDER — GUAIFENESIN 100 MG/5ML PO LIQD
5.0000 mL | ORAL | Status: DC | PRN
  Administered 2021-02-17: 5 mL via ORAL
  Filled 2021-02-17: qty 5

## 2021-02-17 MED ORDER — LORAZEPAM 1 MG PO TABS
1.0000 mg | ORAL_TABLET | ORAL | Status: DC | PRN
  Administered 2021-02-18: 1 mg via ORAL
  Filled 2021-02-17: qty 1

## 2021-02-17 MED ORDER — DEXTROMETHORPHAN POLISTIREX ER 30 MG/5ML PO SUER
30.0000 mg | Freq: Two times a day (BID) | ORAL | Status: DC
  Administered 2021-02-17 – 2021-02-20 (×6): 30 mg via ORAL
  Filled 2021-02-17 (×7): qty 5

## 2021-02-17 MED ORDER — ADULT MULTIVITAMIN W/MINERALS CH
1.0000 | ORAL_TABLET | Freq: Every day | ORAL | Status: DC
  Administered 2021-02-17 – 2021-02-22 (×5): 1 via ORAL
  Filled 2021-02-17 (×6): qty 1

## 2021-02-17 MED ORDER — MAGNESIUM SULFATE 4 GM/100ML IV SOLN
4.0000 g | Freq: Once | INTRAVENOUS | Status: AC
  Administered 2021-02-17: 4 g via INTRAVENOUS
  Filled 2021-02-17: qty 100

## 2021-02-17 MED ORDER — FOLIC ACID 1 MG PO TABS
1.0000 mg | ORAL_TABLET | Freq: Every day | ORAL | Status: DC
  Administered 2021-02-17 – 2021-02-22 (×5): 1 mg via ORAL
  Filled 2021-02-17 (×6): qty 1

## 2021-02-17 MED ORDER — LORAZEPAM 1 MG PO TABS
0.0000 mg | ORAL_TABLET | Freq: Four times a day (QID) | ORAL | Status: DC
  Administered 2021-02-17: 2 mg via ORAL
  Filled 2021-02-17: qty 2

## 2021-02-17 MED ORDER — SPIRITUS FRUMENTI
1.0000 | Freq: Three times a day (TID) | ORAL | Status: DC
  Administered 2021-02-17 – 2021-02-18 (×3): 1 via ORAL
  Filled 2021-02-17 (×12): qty 1

## 2021-02-17 MED ORDER — TRAZODONE HCL 50 MG PO TABS
50.0000 mg | ORAL_TABLET | Freq: Every evening | ORAL | Status: DC | PRN
  Administered 2021-02-20 (×2): 50 mg via ORAL
  Filled 2021-02-17 (×2): qty 1

## 2021-02-17 MED ORDER — CHLORDIAZEPOXIDE HCL 5 MG PO CAPS
10.0000 mg | ORAL_CAPSULE | Freq: Three times a day (TID) | ORAL | Status: DC
  Administered 2021-02-17 – 2021-02-18 (×5): 10 mg via ORAL
  Filled 2021-02-17 (×5): qty 2

## 2021-02-17 MED ORDER — THIAMINE HCL 100 MG PO TABS
100.0000 mg | ORAL_TABLET | Freq: Every day | ORAL | Status: DC
  Administered 2021-02-17 – 2021-02-22 (×5): 100 mg via ORAL
  Filled 2021-02-17 (×6): qty 1

## 2021-02-17 MED ORDER — ADULT MULTIVITAMIN W/MINERALS CH: 1.0000 | ORAL_TABLET | Freq: Every day | ORAL | Status: DC

## 2021-02-17 MED ORDER — LORAZEPAM 1 MG PO TABS: 0.0000 mg | ORAL_TABLET | Freq: Two times a day (BID) | ORAL | Status: DC

## 2021-02-17 NOTE — TOC Initial Note (Signed)
Transition of Care [facility]) - Initial/Assessment Note    Patient Details  Name: Anthony Little MRN: [id number] Date of Birth: 09-18-1950  Transition of [facility]) CM/SW Contact:    Ihor Gully, LCSW Phone Number: 02/17/2021, 12:15 PM  Clinical Narrative:                 Patient from home with spouse. Admitted for community PNA. Consulted for SA needs. Discussed SA needs with patient. Patient is agreeable to SA resources. He knows how to access treatment. He has not previously been in SA treatment.  SA resources provided to Mrs. Timberlake at bedside. Patient was asleep.   Expected Discharge Plan: Home/Self Care Barriers to Discharge: Continued Medical Work up   Patient Goals and CMS Choice Patient states their goals for this hospitalization and ongoing recovery are:: home   Choice offered to / list presented to : Patient  Expected Discharge Plan and Services Expected Discharge Plan: Home/Self Care       Living arrangements for the past 2 months: Single Family Home                                      Prior Living Arrangements/Services Living arrangements for the past 2 months: Single Family Home Lives with:: Spouse Patient language and need for interpreter reviewed:: Yes        Need for Family Participation in Patient Care: Yes (Comment) Care giver support system in place?: Yes (comment)   Criminal Activity/Legal Involvement Pertinent to Current Situation/Hospitalization: No - Comment as needed  Activities of Daily Living Home Assistive Devices/Equipment: None ADL Screening (condition at time of admission) Patient's cognitive ability adequate to safely complete daily activities?: Yes Is the patient deaf or have difficulty hearing?: No Does the patient have difficulty seeing, even when wearing glasses/contacts?: Yes Does the patient have difficulty concentrating, remembering, or making decisions?: No Patient able to express need for assistance with  ADLs?: Yes Does the patient have difficulty dressing or bathing?: No Independently performs ADLs?: Yes (appropriate for developmental age) Does the patient have difficulty walking or climbing stairs?: No Weakness of Legs: Both Weakness of Arms/Hands: None  Permission Sought/Granted                  Emotional Assessment       Orientation: : Oriented to Self, Oriented to  Time, Oriented to Place, Oriented to Situation Alcohol / Substance Use: Not Applicable Psych Involvement: No (comment)  Admission diagnosis:  Hypoxemia [R09.02] Community acquired pneumonia [J18.9] Sepsis ([place]) [A41.9] Community acquired pneumonia, unspecified laterality [J18.9] Patient Active Problem List   Diagnosis Date Noted   Community acquired pneumonia 02/16/2021   Atrial fibrillation and flutter (HCC)    CKD (chronic kidney disease) stage 3, GFR 30-59 ml/min (HCC)    Abdominal aortic aneurysm (AAA)    Sepsis ([place])    Hypokalemia    Lactic acidosis    Purpura senilis ([place]) 03/18/2020   Coronary artery disease involving native heart without angina pectoris 07/14/2019   Pulmonary nodule 07/14/2019   Ectatic thoracic aorta ([place]) 07/14/2019   Thoracic aorta atherosclerosis ([place]) 03/07/2016   Hyperlipidemia 01/14/2016   History of colonic polyps 10/21/2015   Essential hypertension 06/08/2014   Brain aneurysm 09/04/2013   Alcohol abuse 09/04/2013   Early satiety 10/02/2012   Abnormal weight loss 10/02/2012   Abdominal pain, chronic, right upper quadrant 10/02/2012   PCP:  Kathyrn Drown, MD Pharmacy:   [id number] [place], [place] [address] [place] [address] Phone: [phone] Fax: [phone]  [facility] [place], [place] [address] [place] [address] Phone: [phone] Fax: [phone]  [facility] ([facility]) [place], [place] [address] [place] [address] Phone: [phone] Fax: [phone]     Social Determinants of Health (SDOH) Interventions    Readmission Risk Interventions No flowsheet data found.

## 2021-02-17 NOTE — Progress Notes (Signed)
Patient is having hallucinations. While preparing medicine, he stated "you see my dog over there." (Pointing to the end of the side table) I said "No sir I don't." I asked him how long that's been going on he said for three days. He denied that he was having auditory hallucinations. He said sometimes he sees his dog and point to the bed and thinks someone is standing there because he said it looks like an arm. And sometimes he said that he will be talking to a person that sitting beside the bed, then realizes no one is there. Hoy Finlay, MD.

## 2021-02-17 NOTE — Progress Notes (Signed)
Initial Nutrition Assessment  DOCUMENTATION CODES:   Not applicable  INTERVENTION:  Ensure Enlive po BID - provides 350 kcal and 20 grams of protein q 237 ml  Regular diet   NUTRITION DIAGNOSIS:   Inadequate oral intake related to acute illness (recent flu and pna) as evidenced by per patient/family report (diet hx recall of decreased nutrition intake the past 3 weeks).   GOAL:  Patient will meet greater than or equal to 90% of their needs  MONITOR:  PO intake, Supplement acceptance, Labs, Weight trends  REASON FOR ASSESSMENT:   Malnutrition Screening Tool    ASSESSMENT: Patient is a 70 yo male with hx of COPD, CVA, CKD-3, HTN and HLD. Patient presents with shortness of breath, weakness. Critical labs- anaerobic gram+cocci and per nursing note pt hallucinating.   Patient breakfast tray observed- po 50-75% of meal consumed. He has not been eating as well the past 3 weeks. He complains of ongoing coughing. Patient has not been drinking ONS at home but is willing to try them during hospitalization.   Weights: ranging 87-90 kg the past 10 months. Currently 88.5 kg.   Medications include: Klor-con, Nicoderm.   IVF-NS @ 35 ml/hr Drips: Zithromax, Magnesium sulfate and Rocephin  Labs:  BMP Latest Ref Rng & Units 02/17/2021 02/16/2021 11/17/2020  Glucose 70 - 99 mg/dL 87 109(H) -  BUN 8 - 23 mg/dL 15 15 -  Creatinine 0.61 - 1.24 mg/dL 1.17 1.30(H) 1.90(H)  BUN/Creat Ratio 10 - 24 - - -  Sodium 135 - 145 mmol/L 138 134(L) -  Potassium 3.5 - 5.1 mmol/L 3.2(L) 2.4(LL) -  Chloride 98 - 111 mmol/L 97(L) 84(L) -  CO2 22 - 32 mmol/L 33(H) 38(H) -  Calcium 8.9 - 10.3 mg/dL 8.1(L) 8.7(L) -      NUTRITION - FOCUSED PHYSICAL EXAM:  Flowsheet Row Most Recent Value  Orbital Region Mild depletion  Upper Arm Region No depletion  Thoracic and Lumbar Region No depletion  Buccal Region Mild depletion  Temple Region Mild depletion  Clavicle Bone Region No depletion  Clavicle and  Acromion Bone Region No depletion  Scapular Bone Region No depletion  Dorsal Hand Moderate depletion  Patellar Region Unable to assess  Anterior Thigh Region Unable to assess  Posterior Calf Region Unable to assess  Edema (RD Assessment) Mild  Hair Reviewed  Eyes Reviewed  Mouth Reviewed  Skin Reviewed  Nails Reviewed       Diet Order:   Diet Order             Diet regular Room service appropriate? Yes; Fluid consistency: Thin  Diet effective now                   EDUCATION NEEDS:  Education needs have been addressed  Skin:  Skin Assessment: Reviewed RN Assessment  Last BM:  12/7 dirrhea  Height:   Ht Readings from Last 1 Encounters:  02/16/21 5\' 10"  (1.778 m)    Weight:   Wt Readings from Last 1 Encounters:  02/16/21 88.5 kg    Ideal Body Weight:   75 kg  BMI:  Body mass index is 27.98 kg/m.  Estimated Nutritional Needs:   Kcal:  2100-2300  Protein:  94-99 gr  Fluid:  > 2liters daily   Colman Cater MS,RD,CSG,LDN Contact: AMION

## 2021-02-17 NOTE — Progress Notes (Signed)
Patients wife talked to me alone outside the room and she said that she been trying to get to me or talk to someone but she explained to me that patient is an alcoholic and would drink every night. She couldn't tell me how much but she said he would buy a 40 oz of beer and not drink all of it but enough to get drunk and have some falls. She said when she noticed his sickness was getting worse she started pouring it out and that's when she noticed the hallucinations he was having for several days. Before being brought to the hospital. Notified MD

## 2021-02-17 NOTE — Progress Notes (Addendum)
PROGRESS NOTE   Anthony Little  VQQ:[id number] DOB: [birth year] DOA: 02/16/2021 PCP: Kathyrn Drown, MD   Chief Complaint  Patient presents with   Shortness of Breath   Level of care: Telemetry  Brief Admission History:  70 y.o. male with medical history significant for chronic daily smoker, COPD, atrial flutter/atrial fibrillation on apixaban, AAA, stage 3a CKD, hyperlipidemia, prior CVA, recently recovered from influenza A infection from 3 weeks ago presents with progressive shortness of breath, cough, malaise, weakness and he has taken 2 negative covid tests at home.  He presents because symptoms continue to worsen.  He has had fever and chills at home.  He has had nausea but no emesis and no diarrhea.  He denies chest pain.  He has palpitations.  He has no vision changes.  He was hypoxic on arrival down to 88% on room air and placed on 2L/min nasal cannula with improvement in oxygen saturation to 95%.  He reportedly does not use oxygen at home.  His labs were notable for an elevated lactic acid of 2.2.  CXR was notable for infiltrates in LLL and RUL.  He was started on sepsis protocol and IV antibiotics and admission was requested  Assessment & Plan:   Principal Problem:   Community acquired pneumonia Active Problems:   Sepsis ([place])   Brain aneurysm   Alcohol abuse   Essential hypertension   Hyperlipidemia   Thoracic aorta atherosclerosis ([place])   Coronary artery disease involving native heart without angina pectoris   Atrial fibrillation and flutter (HCC)   CKD (chronic kidney disease) stage 3, GFR 30-59 ml/min (HCC)   Abdominal aortic aneurysm (AAA)   Hypokalemia   Lactic acidosis   Sepsis secondary to pneumonia  - sepsis physiology resolved now - lactic acidosis treated and resolved - continue supportive care and IV antibiotics - follow blood cultures and sputum culture   Community Acquired Pneumonia - likely a postviral pneumonia given recent influenza infection  -  continue IV antibiotics and supportive measures - follow blood cultures: 1/2 with anaerobic bottle G pos cocci   Hypokalemia - severe - from HCTZ  - Hold HCTZ for now - oral potassium replacement ordered - check Magnesium and replace as needed  Hypomagnesemia - IV replacement ordered, recheck in AM    Atrial fibrillation - increased metoprolol XL to 37.5 mg daily  - resumed apixaban for full anticoagulation    Hyperlipidemia - resume home atorvastatin    Tobacco use - nicotine patch ordered while in hospital - counseled on tobacco cessation   Alcohol abuse - wife reported that he drinks daily alcohol and heavily - she reports cutting his alcohol leading to hallucinations - CIWA protocol started.  - repleting vitamins, thiamine, folic acid  - start librium tablets  DVT prophylaxis: apixaban Code Status: Full  Family Communication: wife updated Disposition: anticipate DC  home  Status is: Inpatient  Remains inpatient appropriate because: IV antibiotics and IV fluids   Consultants:  N/a  Procedures:  N/a  Antimicrobials:  Ceftriaxone 12/7>> Azithromycin 12/7>>   Subjective: Pt reports somewhat better, he has been hallucinating, cough and chest congestion persists.    Objective: Vitals:   02/17/21 0055 02/17/21 0442 02/17/21 1015 02/17/21 1100  BP: (!) 144/87 (!) 142/93  122/87  Pulse: 96 (!) 105  (!) 104  Resp: 17 20    Temp: 98.7 F (37.1 C) 98 F (36.7 C)    TempSrc: Oral Oral    SpO2: 93% 95% 96%  Weight:      Height:        Intake/Output Summary (Last 24 hours) at 02/17/2021 1149 Last data filed at 02/17/2021 0442 Gross per 24 hour  Intake 3198 ml  Output 300 ml  Net 2898 ml   Filed Weights   02/16/21 1141  Weight: 88.5 kg    Examination:  General exam: thin emaciated appearing male, awake, loud congested cough, Appears calm and comfortable. Having intermittent confusion and hallucinations.  Respiratory system: rales RLL, mild increased  work of breathing. Cardiovascular system: normal S1 & S2 heard. No JVD, murmurs, rubs, gallops or clicks. No pedal edema. Gastrointestinal system: Abdomen is nondistended, soft and nontender. No organomegaly or masses felt. Normal bowel sounds heard. Central nervous system: Alert and oriented. No focal neurological deficits. Extremities: Symmetric 5 x 5 power. Skin: No rashes, lesions or ulcers Psychiatry: Judgement and insight appear poor. Mood & affect appropriate.   Data Reviewed: I have personally reviewed following labs and imaging studies  CBC: Recent Labs  Lab 02/16/21 1208 02/17/21 0517  WBC 9.5 7.7  NEUTROABS 7.5 6.1  HGB 14.0 11.9*  HCT 41.3 35.6*  MCV 108.1* 110.2*  PLT 275 222    Basic Metabolic Panel: Recent Labs  Lab 02/16/21 1200 02/16/21 1208 02/17/21 0517  NA  --  134* 138  K  --  2.4* 3.2*  CL  --  84* 97*  CO2  --  38* 33*  GLUCOSE  --  109* 87  BUN  --  15 15  CREATININE  --  1.30* 1.17  CALCIUM  --  8.7* 8.1*  MG 1.8  --  1.6*    GFR: Estimated Creatinine Clearance: 66.8 mL/min (by C-G formula based on SCr of 1.17 mg/dL).  Liver Function Tests: Recent Labs  Lab 02/16/21 1208 02/17/21 0517  AST 47* 31  ALT 30 21  ALKPHOS 109 84  BILITOT 0.7 0.7  PROT 7.2 5.7*  ALBUMIN 2.6* 2.1*    CBG: No results for input(s): GLUCAP in the last 168 hours.  Recent Results (from the past 240 hour(s))  Culture, blood (Routine x 2)     Status: None (Preliminary result)   Collection Time: 02/16/21 12:08 PM   Specimen: BLOOD  Result Value Ref Range Status   Specimen Description   Final    BLOOD BLOOD LEFT FOREARM Performed at [facility], [address], [place], [place] [address]    Special Requests   Final    BOTTLES DRAWN AEROBIC AND ANAEROBIC Blood Culture results may not be optimal due to an excessive volume of blood received in culture bottles Performed at [facility], [address] [place], [place] [address]    Culture  Setup Time   Final     GRAM POSITIVE COCCI Gram Stain Report Called to,Read Back By and Verified With: TIA PARRIS @ 1931 ON 02/16/21 C VARNER    Culture   Final    CULTURE REINCUBATED FOR BETTER GROWTH Performed at [facility], [place] [address], [place], [place] [address]    Report Status PENDING  Incomplete  Blood Culture ID Panel (Reflexed)     Status: None   Collection Time: 02/16/21 12:08 PM  Result Value Ref Range Status   Enterococcus faecalis NOT DETECTED NOT DETECTED Final   Enterococcus Faecium NOT DETECTED NOT DETECTED Final   Listeria monocytogenes NOT DETECTED NOT DETECTED Final   Staphylococcus species NOT DETECTED NOT DETECTED Final   Staphylococcus aureus (BCID) NOT DETECTED NOT  DETECTED Final   Staphylococcus epidermidis NOT DETECTED NOT DETECTED Final   Staphylococcus lugdunensis NOT DETECTED NOT DETECTED Final   Streptococcus species NOT DETECTED NOT DETECTED Final   Streptococcus agalactiae NOT DETECTED NOT DETECTED Final   Streptococcus pneumoniae NOT DETECTED NOT DETECTED Final   Streptococcus pyogenes NOT DETECTED NOT DETECTED Final   A.calcoaceticus-baumannii NOT DETECTED NOT DETECTED Final   Bacteroides fragilis NOT DETECTED NOT DETECTED Final   Enterobacterales NOT DETECTED NOT DETECTED Final   Enterobacter cloacae complex NOT DETECTED NOT DETECTED Final   Escherichia coli NOT DETECTED NOT DETECTED Final   Klebsiella aerogenes NOT DETECTED NOT DETECTED Final   Klebsiella oxytoca NOT DETECTED NOT DETECTED Final   Klebsiella pneumoniae NOT DETECTED NOT DETECTED Final   Proteus species NOT DETECTED NOT DETECTED Final   Salmonella species NOT DETECTED NOT DETECTED Final   Serratia marcescens NOT DETECTED NOT DETECTED Final   Haemophilus influenzae NOT DETECTED NOT DETECTED Final   Neisseria meningitidis NOT DETECTED NOT DETECTED Final   Pseudomonas aeruginosa NOT DETECTED NOT DETECTED Final   Stenotrophomonas maltophilia NOT DETECTED NOT DETECTED Final   Candida albicans  NOT DETECTED NOT DETECTED Final   Candida auris NOT DETECTED NOT DETECTED Final   Candida glabrata NOT DETECTED NOT DETECTED Final   Candida krusei NOT DETECTED NOT DETECTED Final   Candida parapsilosis NOT DETECTED NOT DETECTED Final   Candida tropicalis NOT DETECTED NOT DETECTED Final   Cryptococcus neoformans/gattii NOT DETECTED NOT DETECTED Final    Comment: Performed at [facility], [address], [place], [place] [address]  Culture, blood (Routine x 2)     Status: None (Preliminary result)   Collection Time: 02/16/21 12:09 PM   Specimen: BLOOD  Result Value Ref Range Status   Specimen Description BLOOD BLOOD RIGHT FOREARM  Final   Special Requests   Final    BOTTLES DRAWN AEROBIC AND ANAEROBIC Blood Culture adequate volume Performed at [facility], [address], [place], [place] [address]    Culture PENDING  Incomplete   Report Status PENDING  Incomplete  Resp Panel by RT-PCR (Flu A&B, Covid) Nasopharyngeal Swab     Status: None   Collection Time: 02/16/21 12:36 PM   Specimen: Nasopharyngeal Swab; Nasopharyngeal(NP) swabs in vial transport medium  Result Value Ref Range Status   SARS Coronavirus 2 by RT PCR NEGATIVE NEGATIVE Final    Comment: (NOTE) SARS-CoV-2 target nucleic acids are NOT DETECTED.  The SARS-CoV-2 RNA is generally detectable in upper respiratory specimens during the acute phase of infection. The lowest concentration of SARS-CoV-2 viral copies this assay can detect is 138 copies/mL. A negative result does not preclude SARS-Cov-2 infection and should not be used as the sole basis for treatment or other patient management decisions. A negative result may occur with  improper specimen collection/handling, submission of specimen other than nasopharyngeal swab, presence of viral mutation(s) within the areas targeted by this assay, and inadequate number of viral copies(<138 copies/mL). A negative result must be combined with clinical observations,  patient history, and epidemiological information. The expected result is Negative.  Fact Sheet for Patients:  EntrepreneurPulse.com.au  Fact Sheet for Healthcare Providers:  IncredibleEmployment.be  This test is no t yet approved or cleared by the [place] FDA and  has been authorized for detection and/or diagnosis of SARS-CoV-2 by FDA under an Emergency Use Authorization (EUA). This EUA will remain  in effect (meaning this test can be used) for the duration of the COVID-19 declaration under Section  564(b)(1) of the Act, 21 U.S.C.section 360bbb-3(b)(1), unless the authorization is terminated  or revoked sooner.       Influenza A by PCR NEGATIVE NEGATIVE Final   Influenza B by PCR NEGATIVE NEGATIVE Final    Comment: (NOTE) The Xpert Xpress SARS-CoV-2/FLU/RSV plus assay is intended as an aid in the diagnosis of influenza from Nasopharyngeal swab specimens and should not be used as a sole basis for treatment. Nasal washings and aspirates are unacceptable for Xpert Xpress SARS-CoV-2/FLU/RSV testing.  Fact Sheet for Patients: EntrepreneurPulse.com.au  Fact Sheet for Healthcare Providers: IncredibleEmployment.be  This test is not yet approved or cleared by the [place] FDA and has been authorized for detection and/or diagnosis of SARS-CoV-2 by FDA under an Emergency Use Authorization (EUA). This EUA will remain in effect (meaning this test can be used) for the duration of the COVID-19 declaration under Section 564(b)(1) of the Act, 21 U.S.C. section 360bbb-3(b)(1), unless the authorization is terminated or revoked.  Performed at [facility], [address], [place], [place] [address]      Radiology Studies: [facility] Chest [facility] [place] 1 View  Result Date: 02/16/2021 CLINICAL DATA:  Congestion and shortness of breath. Suspected sepsis. EXAM: PORTABLE CHEST 1 VIEW COMPARISON:  05/21/2020 FINDINGS: Numerous  leads and wires project over the chest. Midline trachea. Normal heart size. Atherosclerosis in the transverse aorta. Mild to moderate right hemidiaphragm elevation. No pleural effusion or pneumothorax. Interstitial coarsening, related to COPD/chronic bronchitis. More focal opacities within the left lower and inferior right upper lobes. IMPRESSION: Development of left lower and right upper lobe pulmonary opacities, infection versus less likely aspiration. Underlying COPD/chronic bronchitis. Aortic Atherosclerosis (ICD10-I70.0). Electronically Signed   By: Abigail Miyamoto M.D.   On: 02/16/2021 12:37    Scheduled Meds:  amLODipine  5 mg Oral Daily   apixaban  5 mg Oral BID   atorvastatin  10 mg Oral QPM   buPROPion  150 mg Oral Daily   dextromethorphan  30 mg Oral BID   feeding supplement  237 mL Oral BID BM   folic acid  1 mg Oral Daily   guaiFENesin  1,200 mg Oral BID   influenza vaccine adjuvanted  0.5 mL Intramuscular Tomorrow-1000   ipratropium-albuterol  3 mL Nebulization TID   LORazepam  0-4 mg Oral Q6H   Followed by   Derrill Memo ON 02/19/2021] LORazepam  0-4 mg Oral Q12H   metoprolol succinate  37.5 mg Oral Daily   multivitamin with minerals  1 tablet Oral Daily   nicotine  21 mg Transdermal Daily   potassium chloride SA  40 mEq Oral Daily   potassium chloride  40 mEq Oral Once   thiamine  100 mg Oral Daily   Or   thiamine  100 mg Intravenous Daily   Continuous Infusions:  0.9 % NaCl with KCl 20 mEq / L 35 mL/hr at 02/17/21 0958   azithromycin 500 mg (02/16/21 1351)   cefTRIAXone (ROCEPHIN)  IV Stopped (02/16/21 1702)   magnesium sulfate bolus IVPB 4 g (02/17/21 0957)    LOS: 1 day   Time spent: 58 mins   Imane Burrough Wynetta Emery, MD How to contact the [facility] Attending or Consulting provider [place] or covering provider during after hours [place], for this patient?  Check the care team in [facility] and look for a) attending/consulting [facility] provider listed and b) the [facility] team listed Log into  [email or web address] and use [place]'s universal password to access. If you do not have the password, please  contact the hospital operator. Locate the [facility] provider you are looking for under [facility] and page to a number that you can be directly reached. If you still have difficulty reaching the provider, please page the [facility] (Director on Call) for the Hospitalists listed on amion for assistance.  02/17/2021, 11:49 AM

## 2021-02-18 DIAGNOSIS — F10931 Alcohol use, unspecified with withdrawal delirium: Secondary | ICD-10-CM

## 2021-02-18 LAB — BASIC METABOLIC PANEL
Anion gap: 8 (ref 5–15)
BUN: 17 mg/dL (ref 8–23)
CO2: 29 mmol/L (ref 22–32)
Calcium: 8.4 mg/dL — ABNORMAL LOW (ref 8.9–10.3)
Chloride: 102 mmol/L (ref 98–111)
Creatinine, Ser: 1.02 mg/dL (ref 0.61–1.24)
GFR, Estimated: >60 mL/min (ref >60)
Glucose, Bld: 86 mg/dL (ref 70–99)
Potassium: 3.7 mmol/L (ref 3.5–5.1)
Sodium: 139 mmol/L (ref 135–145)

## 2021-02-18 LAB — MAGNESIUM: Magnesium: 2.2 mg/dL (ref 1.7–2.4)

## 2021-02-18 LAB — LEGIONELLA PNEUMOPHILA SEROGP 1 UR AG: L. pneumophila Serogp 1 Ur Ag: NEGATIVE

## 2021-02-18 MED ORDER — METOPROLOL SUCCINATE ER 50 MG PO TB24
50.0000 mg | ORAL_TABLET | Freq: Every day | ORAL | Status: DC
  Administered 2021-02-19 – 2021-02-22 (×3): 50 mg via ORAL
  Filled 2021-02-18 (×4): qty 1

## 2021-02-18 NOTE — Progress Notes (Signed)
02/18/2021 4:41 PM  Wife called me to room, reporting patient has not been eating for 3 weeks, losing weight and complaining of pain with eating any meal and avoiding meals. Pt sees Dr. Abbey Chatters with [facility].  Had a colonoscopy.  Wife requesting EGD and concerned about stomach cancer.  She is requesting consult with Dr. Abbey Chatters.  Will ask for GI to see in AM.   Murvin Natal MD

## 2021-02-18 NOTE — Progress Notes (Signed)
PROGRESS NOTE   Anthony Little  YTW:[id number] DOB: 11/12/1950 DOA: 02/16/2021 PCP: Kathyrn Drown, MD   Chief Complaint  Patient presents with   Shortness of Breath   Level of care: Telemetry  Brief Admission History:  70 y.o. male with medical history significant for chronic daily smoker, COPD, atrial flutter/atrial fibrillation on apixaban, AAA, stage 3a CKD, hyperlipidemia, prior CVA, recently recovered from influenza A infection from 3 weeks ago presents with progressive shortness of breath, cough, malaise, weakness and he has taken 2 negative covid tests at home.  He presents because symptoms continue to worsen.  He has had fever and chills at home.  He has had nausea but no emesis and no diarrhea.  He denies chest pain.  He has palpitations.  He has no vision changes.  He was hypoxic on arrival down to 88% on room air and placed on 2L/min nasal cannula with improvement in oxygen saturation to 95%.  He reportedly does not use oxygen at home.  His labs were notable for an elevated lactic acid of 2.2.  CXR was notable for infiltrates in LLL and RUL.  He was started on sepsis protocol and IV antibiotics and admission was requested  Assessment & Plan:   Principal Problem:   Community acquired pneumonia Active Problems:   Sepsis ([place])   Brain aneurysm   Alcohol abuse   Essential hypertension   Hyperlipidemia   Thoracic aorta atherosclerosis ([place])   Coronary artery disease involving native heart without angina pectoris   Atrial fibrillation and flutter (HCC)   CKD (chronic kidney disease) stage 3, GFR 30-59 ml/min (HCC)   Abdominal aortic aneurysm (AAA)   Hypokalemia   Lactic acidosis  Sepsis secondary to pneumonia  - sepsis physiology resolved now - lactic acidosis treated and resolved - continue supportive care and IV antibiotics - follow blood cultures and sputum culture:    Community Acquired Pneumonia - likely a postviral pneumonia given recent influenza infection  -  continue IV antibiotics and supportive measures - follow blood cultures: 1/2 with anaerobic bottle G pos cocci possible contaminant, follow for ID and sensitivities    Hypokalemia - Repleted  - from HCTZ  - Hold HCTZ for now - oral potassium replacement ordered - Mg has been repleted.   Hypomagnesemia - IV replacement ordered and repleted    Chronic Atrial fibrillation - increased metoprolol XL to 50 mg daily starting 12/10 - resumed apixaban for full anticoagulation    Hyperlipidemia - resumed home atorvastatin    Tobacco use - nicotine patch ordered while in hospital - counseled on tobacco cessation   Alcohol abuse with acute alcohol withdrawal delirium tremens - wife reported that he drinks daily alcohol and heavily every night - she reported recently stopping his alcohol consumption at home leading to hallucinations - CIWA protocol started and spiritus frumenti ordered TID - repleting vitamins, thiamine, folic acid  - continue librium tablets TID   DVT prophylaxis: apixaban Code Status: Full  Family Communication: wife updated at bedside  Disposition: anticipate DC home in 1-2 days  Status is: Inpatient  Remains inpatient appropriate because: IV antibiotics and IV fluids   Consultants:  N/a  Procedures:  N/a  Antimicrobials:  Ceftriaxone 12/7>> Azithromycin 12/7>>   Subjective: Pt with less hallucinations and tremors today.     Objective: Vitals:   02/18/21 0425 02/18/21 0814 02/18/21 0930 02/18/21 1213  BP: (!) 148/89  (!) 141/94 (!) 143/94  Pulse: (!) 101  (!) 104 (!) 108  Resp: 18  18 18   Temp: 97.8 F (36.6 C)  97.8 F (36.6 C) (!) 97.5 F (36.4 C)  TempSrc: Oral   Oral  SpO2: 92% (!) 86% 96% 99%  Weight:      Height:        Intake/Output Summary (Last 24 hours) at 02/18/2021 1317 Last data filed at 02/18/2021 1000 Gross per 24 hour  Intake 1672.04 ml  Output 400 ml  Net 1272.04 ml   Filed Weights   02/16/21 1141  Weight: 88.5 kg     Examination:  General exam: thin emaciated appearing male, awake, loud congested cough, Appears calm and comfortable. Mild resting tremor. Somnolent but arousable.  Respiratory system: rales RLL, mild increased work of breathing. Cardiovascular system: normal S1 & S2 heard. No JVD, murmurs, rubs, gallops or clicks. No pedal edema. Gastrointestinal system: Abdomen is nondistended, soft and nontender. No organomegaly or masses felt. Normal bowel sounds heard. Central nervous system: Alert and oriented. No focal neurological deficits. Extremities: Symmetric 5 x 5 power. Skin: No rashes, lesions or ulcers Psychiatry: Judgement and insight appear poor. Mood & affect appropriate.   Data Reviewed: I have personally reviewed following labs and imaging studies  CBC: Recent Labs  Lab 02/16/21 1208 02/17/21 0517  WBC 9.5 7.7  NEUTROABS 7.5 6.1  HGB 14.0 11.9*  HCT 41.3 35.6*  MCV 108.1* 110.2*  PLT 275 798    Basic Metabolic Panel: Recent Labs  Lab 02/16/21 1200 02/16/21 1208 02/17/21 0517 02/18/21 0829  NA  --  134* 138 139  K  --  2.4* 3.2* 3.7  CL  --  84* 97* 102  CO2  --  38* 33* 29  GLUCOSE  --  109* 87 86  BUN  --  15 15 17   CREATININE  --  1.30* 1.17 1.02  CALCIUM  --  8.7* 8.1* 8.4*  MG 1.8  --  1.6* 2.2    GFR: Estimated Creatinine Clearance: 76.6 mL/min (by C-G formula based on SCr of 1.02 mg/dL).  Liver Function Tests: Recent Labs  Lab 02/16/21 1208 02/17/21 0517  AST 47* 31  ALT 30 21  ALKPHOS 109 84  BILITOT 0.7 0.7  PROT 7.2 5.7*  ALBUMIN 2.6* 2.1*    CBG: No results for input(s): GLUCAP in the last 168 hours.  Recent Results (from the past 240 hour(s))  Culture, blood (Routine x 2)     Status: None (Preliminary result)   Collection Time: 02/16/21 12:08 PM   Specimen: BLOOD  Result Value Ref Range Status   Specimen Description   Final    BLOOD BLOOD LEFT FOREARM Performed at [facility], [address], [place], [place] [address]     Special Requests   Final    BOTTLES DRAWN AEROBIC AND ANAEROBIC Blood Culture results may not be optimal due to an excessive volume of blood received in culture bottles Performed at [facility], [address], [place], [place] [address]    Culture  Setup Time   Final    GRAM POSITIVE COCCI Gram Stain Report Called to,Read Back By and Verified With: TIA PARRIS @ 1931 ON 02/16/21 C VARNER    Culture   Final    CULTURE REINCUBATED FOR BETTER GROWTH Performed at [facility], [place] [address] [place], [place] [address]    Report Status PENDING  Incomplete  Blood Culture ID Panel (Reflexed)     Status: None   Collection Time: 02/16/21 12:08 PM  Result Value  Ref Range Status   Enterococcus faecalis NOT DETECTED NOT DETECTED Final   Enterococcus Faecium NOT DETECTED NOT DETECTED Final   Listeria monocytogenes NOT DETECTED NOT DETECTED Final   Staphylococcus species NOT DETECTED NOT DETECTED Final   Staphylococcus aureus (BCID) NOT DETECTED NOT DETECTED Final   Staphylococcus epidermidis NOT DETECTED NOT DETECTED Final   Staphylococcus lugdunensis NOT DETECTED NOT DETECTED Final   Streptococcus species NOT DETECTED NOT DETECTED Final   Streptococcus agalactiae NOT DETECTED NOT DETECTED Final   Streptococcus pneumoniae NOT DETECTED NOT DETECTED Final   Streptococcus pyogenes NOT DETECTED NOT DETECTED Final   A.calcoaceticus-baumannii NOT DETECTED NOT DETECTED Final   Bacteroides fragilis NOT DETECTED NOT DETECTED Final   Enterobacterales NOT DETECTED NOT DETECTED Final   Enterobacter cloacae complex NOT DETECTED NOT DETECTED Final   Escherichia coli NOT DETECTED NOT DETECTED Final   Klebsiella aerogenes NOT DETECTED NOT DETECTED Final   Klebsiella oxytoca NOT DETECTED NOT DETECTED Final   Klebsiella pneumoniae NOT DETECTED NOT DETECTED Final   Proteus species NOT DETECTED NOT DETECTED Final   Salmonella species NOT DETECTED NOT DETECTED Final   Serratia marcescens NOT DETECTED NOT  DETECTED Final   Haemophilus influenzae NOT DETECTED NOT DETECTED Final   Neisseria meningitidis NOT DETECTED NOT DETECTED Final   Pseudomonas aeruginosa NOT DETECTED NOT DETECTED Final   Stenotrophomonas maltophilia NOT DETECTED NOT DETECTED Final   Candida albicans NOT DETECTED NOT DETECTED Final   Candida auris NOT DETECTED NOT DETECTED Final   Candida glabrata NOT DETECTED NOT DETECTED Final   Candida krusei NOT DETECTED NOT DETECTED Final   Candida parapsilosis NOT DETECTED NOT DETECTED Final   Candida tropicalis NOT DETECTED NOT DETECTED Final   Cryptococcus neoformans/gattii NOT DETECTED NOT DETECTED Final    Comment: Performed at [facility], [address], [place], [place] [address]  Culture, blood (Routine x 2)     Status: None (Preliminary result)   Collection Time: 02/16/21 12:09 PM   Specimen: BLOOD  Result Value Ref Range Status   Specimen Description BLOOD BLOOD RIGHT FOREARM  Final   Special Requests   Final    BOTTLES DRAWN AEROBIC AND ANAEROBIC Blood Culture adequate volume   Culture   Final    NO GROWTH 2 DAYS Performed at [facility], [address], [place], [place] [address]    Report Status PENDING  Incomplete  Resp Panel by RT-PCR (Flu A&B, Covid) Nasopharyngeal Swab     Status: None   Collection Time: 02/16/21 12:36 PM   Specimen: Nasopharyngeal Swab; Nasopharyngeal(NP) swabs in vial transport medium  Result Value Ref Range Status   SARS Coronavirus 2 by RT PCR NEGATIVE NEGATIVE Final    Comment: (NOTE) SARS-CoV-2 target nucleic acids are NOT DETECTED.  The SARS-CoV-2 RNA is generally detectable in upper respiratory specimens during the acute phase of infection. The lowest concentration of SARS-CoV-2 viral copies this assay can detect is 138 copies/mL. A negative result does not preclude SARS-Cov-2 infection and should not be used as the sole basis for treatment or other patient management decisions. A negative result may occur with  improper  specimen collection/handling, submission of specimen other than nasopharyngeal swab, presence of viral mutation(s) within the areas targeted by this assay, and inadequate number of viral copies(<138 copies/mL). A negative result must be combined with clinical observations, patient history, and epidemiological information. The expected result is Negative.  Fact Sheet for Patients:  EntrepreneurPulse.com.au  Fact Sheet for Healthcare Providers:  IncredibleEmployment.be  This  test is no t yet approved or cleared by the [place] and  has been authorized for detection and/or diagnosis of SARS-CoV-2 by FDA under an Emergency Use Authorization (EUA). This EUA will remain  in effect (meaning this test can be used) for the duration of the COVID-19 declaration under Section 564(b)(1) of the Act, 21 U.S.C.section 360bbb-3(b)(1), unless the authorization is terminated  or revoked sooner.       Influenza A by PCR NEGATIVE NEGATIVE Final   Influenza B by PCR NEGATIVE NEGATIVE Final    Comment: (NOTE) The Xpert Xpress SARS-CoV-2/FLU/RSV plus assay is intended as an aid in the diagnosis of influenza from Nasopharyngeal swab specimens and should not be used as a sole basis for treatment. Nasal washings and aspirates are unacceptable for Xpert Xpress SARS-CoV-2/FLU/RSV testing.  Fact Sheet for Patients: EntrepreneurPulse.com.au  Fact Sheet for Healthcare Providers: IncredibleEmployment.be  This test is not yet approved or cleared by the [place] FDA and has been authorized for detection and/or diagnosis of SARS-CoV-2 by FDA under an Emergency Use Authorization (EUA). This EUA will remain in effect (meaning this test can be used) for the duration of the COVID-19 declaration under Section 564(b)(1) of the Act, 21 U.S.C. section 360bbb-3(b)(1), unless the authorization is terminated or revoked.  Performed at  [facility], [address], [place], [place] [address]      Radiology Studies: No results found.  Scheduled Meds:  amLODipine  5 mg Oral Daily   apixaban  5 mg Oral BID   atorvastatin  10 mg Oral QPM   buPROPion  150 mg Oral Daily   chlordiazePOXIDE  10 mg Oral TID   dextromethorphan  30 mg Oral BID   feeding supplement  237 mL Oral BID BM   folic acid  1 mg Oral Daily   guaiFENesin  1,200 mg Oral BID   influenza vaccine adjuvanted  0.5 mL Intramuscular Tomorrow-1000   ipratropium-albuterol  3 mL Nebulization TID   [START ON 02/19/2021] metoprolol succinate  50 mg Oral Daily   multivitamin with minerals  1 tablet Oral Daily   nicotine  21 mg Transdermal Daily   potassium chloride SA  40 mEq Oral Daily   spiritus frumenti  1 each Oral TID   thiamine  100 mg Oral Daily   Or   thiamine  100 mg Intravenous Daily   Continuous Infusions:  0.9 % NaCl with KCl 20 mEq / L 35 mL/hr at 02/18/21 1226   azithromycin 500 mg (02/18/21 1310)   cefTRIAXone (ROCEPHIN)  IV Stopped (02/17/21 1541)    LOS: 2 days   Time spent: 6 mins   Shalie Schremp Wynetta Emery, MD How to contact the [facility] Attending or Consulting provider [place] or covering provider during after hours [place], for this patient?  Check the care team in [facility] and look for a) attending/consulting TRH provider listed and b) the [facility] team listed Log into [email or web address] and use [facility]'s universal password to access. If you do not have the password, please contact the hospital operator. Locate the [facility] provider you are looking for under [facility] and page to a number that you can be directly reached. If you still have difficulty reaching the provider, please page the [facility] (Director on Call) for the Hospitalists listed on amion for assistance.  02/18/2021, 1:17 PM

## 2021-02-18 NOTE — Care Management Important Message (Signed)
Important Message  Patient Details  Name: Anthony Little MRN: [id number] Date of Birth: 09/05/1950   Medicare Important Message Given:  Yes     [name] 02/18/2021, 11:50 AM

## 2021-02-19 DIAGNOSIS — R634 Abnormal weight loss: Secondary | ICD-10-CM

## 2021-02-19 DIAGNOSIS — R112 Nausea with vomiting, unspecified: Secondary | ICD-10-CM

## 2021-02-19 DIAGNOSIS — R1310 Dysphagia, unspecified: Secondary | ICD-10-CM

## 2021-02-19 LAB — CBC
HCT: 35.9 % — ABNORMAL LOW (ref 39.0–52.0)
Hemoglobin: 11.9 g/dL — ABNORMAL LOW (ref 13.0–17.0)
MCH: 37.1 pg — ABNORMAL HIGH (ref 26.0–34.0)
MCHC: 33.1 g/dL (ref 30.0–36.0)
MCV: 111.8 fL — ABNORMAL HIGH (ref 80.0–100.0)
Platelets: 227 10*3/uL (ref 150–400)
RBC: 3.21 MIL/uL — ABNORMAL LOW (ref 4.22–5.81)
RDW: 13.2 % (ref 11.5–15.5)
WBC: 11 10*3/uL — ABNORMAL HIGH (ref 4.0–10.5)
nRBC: 0 % (ref 0.0–0.2)

## 2021-02-19 MED ORDER — PANTOPRAZOLE SODIUM 40 MG PO TBEC
40.0000 mg | DELAYED_RELEASE_TABLET | Freq: Every day | ORAL | Status: DC
  Administered 2021-02-19: 40 mg via ORAL
  Filled 2021-02-19: qty 1

## 2021-02-19 MED ORDER — CHLORDIAZEPOXIDE HCL 5 MG PO CAPS
5.0000 mg | ORAL_CAPSULE | Freq: Three times a day (TID) | ORAL | Status: DC
  Administered 2021-02-19: 5 mg via ORAL
  Filled 2021-02-19: qty 1

## 2021-02-19 MED ORDER — CHLORDIAZEPOXIDE HCL 5 MG PO CAPS
5.0000 mg | ORAL_CAPSULE | Freq: Three times a day (TID) | ORAL | Status: DC | PRN
  Administered 2021-02-19 – 2021-02-21 (×3): 5 mg via ORAL
  Filled 2021-02-19 (×3): qty 1

## 2021-02-19 MED ORDER — SPIRITUS FRUMENTI
1.0000 | Freq: Two times a day (BID) | ORAL | Status: DC | PRN
  Filled 2021-02-19: qty 1

## 2021-02-19 MED ORDER — SPIRITUS FRUMENTI
1.0000 | Freq: Two times a day (BID) | ORAL | Status: DC
  Administered 2021-02-19: 1 via ORAL
  Filled 2021-02-19 (×5): qty 1

## 2021-02-19 MED ORDER — PANTOPRAZOLE SODIUM 40 MG PO TBEC
40.0000 mg | DELAYED_RELEASE_TABLET | Freq: Two times a day (BID) | ORAL | Status: DC
  Administered 2021-02-19 – 2021-02-22 (×5): 40 mg via ORAL
  Filled 2021-02-19 (×7): qty 1

## 2021-02-19 NOTE — Evaluation (Signed)
Physical Therapy Evaluation Patient Details Name: Anthony Little MRN: [id number] DOB: 06-18-1950 Today's Date: 02/19/2021  History of Present Illness  Anthony Little is a 70 y.o. male with medical history significant for chronic daily smoker, COPD, atrial flutter/atrial fibrillation on apixaban, AAA, stage 3a CKD, hyperlipidemia, prior CVA, recently recovered from influenza A infection from 3 weeks ago presents with progressive shortness of breath, cough, malaise, weakness and he has taken 2 negative covid tests at home.  He presents because symptoms continue to worsen.  He has had fever and chills at home.  He has had nausea but no emesis and no diarrhea.  He denies chest pain.  He has palpitations.  He has no vision changes.  He was hypoxic on arrival down to 88% on room air and placed on 2L/min nasal cannula with improvement in oxygen saturation to 95%.  He reportedly does not use oxygen at home.  His labs were notable for an elevated lactic acid of 2.2.  He was noted to have a potassium of 2.4 and glucose of 109.  His Na was 134.  His WBC was 9.5.  Creatinine 1.30.  Sars 2 coronavirus was negative and influenza A and B was negative.  CXR was notable for infiltrates in LLL and RUL.  He was started on sepsis protocol and IV antibiotics and admission was requested.   Clinical Impression  Patient unsteady on feet completing sit to stands and transfers and required use of RW for safety during gait training in room/hallway demonstrating slow labored cadence without loss of balance and limited mostly due to fatigue.  Patient tolerated sitting up in chair after therapy with his spouse present in room.  Patient on room air throughout visit and at 91% after therapy - nurse aware.  Patient will benefit from continued skilled physical therapy in hospital and recommended venue below to increase strength, balance, endurance for safe ADLs and gait.       Recommendations for follow up therapy are one  component of a multi-disciplinary discharge planning process, led by the attending physician.  Recommendations may be updated based on patient status, additional functional criteria and insurance authorization.  Follow Up Recommendations Home health PT    Assistance Recommended at Discharge Intermittent Supervision/Assistance  Functional Status Assessment Patient has had a recent decline in their functional status and demonstrates the ability to make significant improvements in function in a reasonable and predictable amount of time.  Equipment Recommendations  Rolling walker (2 wheels)    Recommendations for Other Services       Precautions / Restrictions Precautions Precautions: Fall Restrictions Weight Bearing Restrictions: No      Mobility  Bed Mobility Overal bed mobility: Needs Assistance Bed Mobility: Supine to Sit     Supine to sit: Supervision     General bed mobility comments: labored movement requiring use of bed rail    Transfers Overall transfer level: Needs assistance Equipment used: None;Rolling walker (2 wheels) Transfers: Sit to/from Stand;Bed to chair/wheelchair/BSC Sit to Stand: Min guard   Step pivot transfers: Min guard       General transfer comment: unsteady on feet having to lean on nearby objects for support, safer using RW    Ambulation/Gait Ambulation/Gait assistance: Min guard;Min assist Gait Distance (Feet): 40 Feet Assistive device: Rolling walker (2 wheels) Gait Pattern/deviations: Decreased step length - left;Decreased stance time - right;Decreased stride length;Trunk flexed Gait velocity: decreased     General Gait Details: slow labored slightly unsteady cadence without loss of  balance using RW, limited secondary to fatigue  Stairs            Wheelchair Mobility    Modified Rankin (Stroke Patients Only)       Balance Overall balance assessment: Needs assistance Sitting-balance support: Feet supported;No upper  extremity supported Sitting balance-Leahy Scale: Good Sitting balance - Comments: seated at EOB   Standing balance support: No upper extremity supported;During functional activity Standing balance-Leahy Scale: Poor Standing balance comment: fair using RW                             Pertinent Vitals/Pain Pain Assessment: Faces Faces Pain Scale: Hurts a little bit Pain Location: left flank Pain Descriptors / Indicators: Sore Pain Intervention(s): Limited activity within patient's tolerance;Monitored during session    Home Living Family/patient expects to be discharged to:: Private residence Living Arrangements: Spouse/significant other Available Help at Discharge: Family;Available 24 hours/day Type of Home: House Home Access: Level entry       Home Layout: One level Home Equipment: Shower seat - built in      Prior Function Prior Level of Function : Independent/Modified Independent             Mobility Comments: Hydrographic surveyor, drives, shops ADLs Comments: Independent     Hand Dominance        Extremity/Trunk Assessment   Upper Extremity Assessment Upper Extremity Assessment: Generalized weakness    Lower Extremity Assessment Lower Extremity Assessment: Generalized weakness    Cervical / Trunk Assessment Cervical / Trunk Assessment: Normal  Communication   Communication: No difficulties  Cognition Arousal/Alertness: Awake/alert Behavior During Therapy: WFL for tasks assessed/performed Overall Cognitive Status: Within Functional Limits for tasks assessed                                          General Comments      Exercises     Assessment/Plan    PT Assessment Patient needs continued PT services  PT Problem List Decreased strength;Decreased activity tolerance;Decreased balance;Decreased mobility       PT Treatment Interventions DME instruction;Gait training;Stair training;Functional mobility  training;Therapeutic activities;Therapeutic exercise;Patient/family education;Balance training    PT Goals (Current goals can be found in the Care Plan section)  Acute Rehab PT Goals Patient Stated Goal: return home with family to assist PT Goal Formulation: With patient/family Time For Goal Achievement: 02/22/21 Potential to Achieve Goals: Good    Frequency Min 3X/week   Barriers to discharge        Co-evaluation               AM-PAC PT "6 Clicks" Mobility  Outcome Measure Help needed turning from your back to your side while in a flat bed without using bedrails?: None Help needed moving from lying on your back to sitting on the side of a flat bed without using bedrails?: [name] Help needed moving to and from a bed to a chair (including a wheelchair)?: [name] Help needed standing up from a chair using your arms (e.g., wheelchair or bedside chair)?: [name] Help needed to walk in hospital room?: [name] Help needed climbing 3-5 steps with a railing? : A Lot 6 Click Score: 18    End of Session   Activity Tolerance: Patient tolerated treatment well;Patient limited by fatigue Patient left: in chair;with call bell/phone within reach;with  chair alarm set;with family/visitor present Nurse Communication: Mobility status PT Visit Diagnosis: Unsteadiness on feet (R26.81);Other abnormalities of gait and mobility (R26.89);Muscle weakness (generalized) (M62.81)    Time: 6435-3912 PT Time Calculation (min) (ACUTE ONLY): 22 min   Charges:   PT Evaluation $PT Eval Moderate Complexity: 1 Mod PT Treatments $Therapeutic Activity: 8-22 mins        1:02 PM, 02/19/21 Lonell Grandchild, MPT Physical Therapist with [facility] 336 [phone] office [phone] mobile phone

## 2021-02-19 NOTE — Consult Note (Signed)
Consulting  Provider: Dr. Jenny Reichmann Primary Care Physician:  Kathyrn Drown, MD Primary Gastroenterologist:  Dr. Abbey Chatters  Reason for Consultation: Odynophagia, nausea vomiting, weight loss  HPI:  Anthony Little is a 70 y.o. male with a past medical history of atrial fibrillation chronically on apixaban, CKD, dyslipidemia, prior CVA, significant alcohol abuse, tobacco use, COPD, who was admitted to [facility] 02/16/2021 due to progressively worsening shortness of breath, cough, generalized weakness/fatigue.  Found to have sepsis with community-acquired pneumonia which he has been treated for.  Subsequently went into alcohol withdrawal and delirium tremens now on Librium.  Patient's wife who is bedside provides majority of HPI.  States he drinks daily alcohol and heavily every night.  Has been receiving thiamine, folic acid, as well as vitamins.  Reason for consultation today is odynophagia, nausea vomiting, weight loss.  Patient's wife states for the last 2 to 3 weeks patient has basically refused to eat.  States any times he eats he has pain in his throat as well as nausea vomiting.  Notes patient has lost nearly 20 pounds and she is concerned.  Last EGD 11/01/2015 showed multiple small duodenal ulcers.  Gastric biopsies negative for H. pylori.  Patient currently not any chronic antiacid medications.  No chronic NSAID use.  Past Medical History:  Diagnosis Date   Abdominal aortic aneurysm (AAA)    Atrial fibrillation and flutter (HCC)    CKD (chronic kidney disease) stage 3, GFR 30-59 ml/min (HCC)    CVA (cerebral infarction)    Essential hypertension    Hyperlipidemia    Thoracic aortic aneurysm     Past Surgical History:  Procedure Laterality Date   BIOPSY  08/17/2020   Procedure: BIOPSY;  Surgeon: Eloise Harman, DO;  [facility]: [facility];  Service: Endoscopy;;   COLONOSCOPY  12/2009   Diverticulosis, mild internal hemorrhoids, multiple colon polyps removed (adenomatous).  One irregular shaped polyp was tattooed. Next colonoscopy due October 2016.   COLONOSCOPY N/A 11/01/2015   Procedure: COLONOSCOPY;  Surgeon: Danie Binder, MD;  Location: [facility];  Service: Endoscopy;  Laterality: N/A;  100   COLONOSCOPY WITH PROPOFOL N/A 08/17/2020   Procedure: COLONOSCOPY WITH PROPOFOL;  Surgeon: Eloise Harman, DO;  Location: [facility];  Service: Endoscopy;  Laterality: N/A;  2:15pm   ESOPHAGOGASTRODUODENOSCOPY N/A 10/08/2012   SLF: Moderate erosive gastritis/duodenitis. Biopsies benign.   ESOPHAGOGASTRODUODENOSCOPY N/A 11/01/2015   Procedure: ESOPHAGOGASTRODUODENOSCOPY (EGD);  Surgeon: Danie Binder, MD;  Location: [facility];  Service: Endoscopy;  Laterality: N/A;   POLYPECTOMY  08/17/2020   Procedure: POLYPECTOMY;  Surgeon: Eloise Harman, DO;  Location: [facility];  Service: Endoscopy;;    Prior to Admission medications   Medication Sig Start Date End Date Taking? Authorizing Provider  amLODipine (NORVASC) 5 MG tablet Take 5 mg by mouth daily. 10/21/18  Yes [provider]  apixaban (ELIQUIS) 5 MG TABS tablet Take 1 tablet (5 mg total) by mouth 2 (two) times daily. 10/18/20  Yes Verta Ellen., NP  atorvastatin (LIPITOR) 10 MG tablet Take 10 mg by mouth daily. 12/02/18  Yes [provider]  buPROPion (WELLBUTRIN XL) 150 MG 24 hr tablet TAKE 1 TABLET BY MOUTH EVERY DAY 01/26/21  Yes Luking, Elayne Snare, MD  glucosamine-chondroitin 500-400 MG tablet Take 2 tablets by mouth every morning.   Yes [provider]  hydrochlorothiazide (MICROZIDE) 12.5 MG capsule TAKE 1 CAPSULE BY MOUTH EVERY DAY IN THE MORNING 09/23/20  Yes Kathyrn Drown, MD  metoprolol succinate (TOPROL-XL) 25 MG 24 hr tablet TAKE 1 TABLET (25 MG TOTAL) BY MOUTH DAILY. 09/23/20  Yes Luking, Scott A, MD  mometasone (ELOCON) 0.1 % cream Apply thin amount to affected area BID prn. Not for face or groin Patient taking differently: Apply 1 application topically 2 (two)  times daily as needed (irritation). Not for face or groin 01/26/21  Yes Luking, Scott A, MD  potassium chloride (KLOR-CON) 10 MEQ tablet Take 10 mEq by mouth daily. 01/19/20  Yes [provider]  triamcinolone cream (KENALOG) 0.1 % Apply twice daily PRN Patient taking differently: Apply 1 application topically 2 (two) times daily as needed (irritation). Apply twice daily PRN 08/09/18  Yes Kathyrn Drown, MD    Current Facility-Administered Medications  Medication Dose Route Frequency Provider Last Rate Last Admin   0.9 % NaCl with KCl 20 mEq/ L  infusion   Intravenous Continuous Wynetta Emery, Clanford L, MD 35 mL/hr at 02/18/21 1226 New Bag at 02/18/21 1226   acetaminophen (TYLENOL) tablet 650 mg  650 mg Oral Q6H PRN Wynetta Emery, Clanford L, MD   650 mg at 02/16/21 1637   Or   acetaminophen (TYLENOL) suppository 650 mg  650 mg Rectal Q6H PRN Johnson, Clanford L, MD       albuterol (PROVENTIL) (2.5 MG/3ML) 0.083% nebulizer solution 2.5 mg  2.5 mg Nebulization Q6H PRN Johnson, Clanford L, MD       amLODipine (NORVASC) tablet 5 mg  5 mg Oral Daily Johnson, Clanford L, MD   5 mg at 02/19/21 0930   apixaban (ELIQUIS) tablet 5 mg  5 mg Oral BID Johnson, Clanford L, MD   5 mg at 02/19/21 0930   atorvastatin (LIPITOR) tablet 10 mg  10 mg Oral QPM Johnson, Clanford L, MD   10 mg at 02/18/21 1811   azithromycin (ZITHROMAX) 500 mg in sodium chloride 0.9 % 250 mL IVPB  500 mg Intravenous Q24H Johnson, Clanford L, MD 250 mL/hr at 02/18/21 1310 500 mg at 02/18/21 1310   bisacodyl (DULCOLAX) EC tablet 5 mg  5 mg Oral Daily PRN Wynetta Emery, Clanford L, MD       buPROPion (WELLBUTRIN XL) 24 hr tablet 150 mg  150 mg Oral Daily Johnson, Clanford L, MD   150 mg at 02/19/21 0930   cefTRIAXone (ROCEPHIN) 2 g in sodium chloride 0.9 % 100 mL IVPB  2 g Intravenous Q24H Johnson, Clanford L, MD 200 mL/hr at 02/18/21 1414 2 g at 02/18/21 1414   chlordiazePOXIDE (LIBRIUM) capsule 5 mg  5 mg Oral TID Wynetta Emery, Clanford L, MD   5 mg  at 02/19/21 0946   dextromethorphan (DELSYM) 30 MG/5ML liquid 30 mg  30 mg Oral BID Johnson, Clanford L, MD   30 mg at 02/19/21 0931   feeding supplement (ENSURE ENLIVE / ENSURE PLUS) liquid 237 mL  237 mL Oral BID BM Johnson, Clanford L, MD   237 mL at 70/35/00 9381   folic acid (FOLVITE) tablet 1 mg  1 mg Oral Daily Johnson, Clanford L, MD   1 mg at 02/19/21 0930   guaiFENesin (MUCINEX) 12 hr tablet 1,200 mg  1,200 mg Oral BID Johnson, Clanford L, MD   1,200 mg at 02/19/21 0930   guaiFENesin (ROBITUSSIN) 100 MG/5ML liquid 5 mL  5 mL Oral Q4H PRN Zierle-Ghosh, Asia B, DO   5 mL at 02/17/21 8299   influenza vaccine adjuvanted (FLUAD) injection 0.5 mL  0.5 mL Intramuscular Tomorrow-1000 Wynetta Emery,  Clanford L, MD       ipratropium-albuterol (DUONEB) 0.5-2.5 (3) MG/3ML nebulizer solution 3 mL  3 mL Nebulization TID Wynetta Emery, Clanford L, MD   3 mL at 02/19/21 0836   LORazepam (ATIVAN) tablet 1-4 mg  1-4 mg Oral Q1H PRN Wynetta Emery, Clanford L, MD   1 mg at 02/18/21 1615   Or   LORazepam (ATIVAN) injection 1-4 mg  1-4 mg Intravenous Q1H PRN Wynetta Emery, Clanford L, MD   3 mg at 02/17/21 1809   metoprolol succinate (TOPROL-XL) 24 hr tablet 50 mg  50 mg Oral Daily Johnson, Clanford L, MD   50 mg at 02/19/21 0930   multivitamin with minerals tablet 1 tablet  1 tablet Oral Daily Johnson, Clanford L, MD   1 tablet at 02/19/21 0930   nicotine (NICODERM CQ - dosed in mg/24 hours) patch 21 mg  21 mg Transdermal Daily Johnson, Clanford L, MD   21 mg at 02/19/21 0937   ondansetron (ZOFRAN) tablet 4 mg  4 mg Oral Q6H PRN Johnson, Clanford L, MD       Or   ondansetron (ZOFRAN) injection 4 mg  4 mg Intravenous Q6H PRN Johnson, Clanford L, MD       oxyCODONE (Oxy IR/ROXICODONE) immediate release tablet 5 mg  5 mg Oral Q6H PRN Johnson, Clanford L, MD   5 mg at 02/16/21 1826   pantoprazole (PROTONIX) EC tablet 40 mg  40 mg Oral Q0600 Johnson, Clanford L, MD   40 mg at 02/19/21 0946   potassium chloride SA (KLOR-CON M) CR tablet  40 mEq  40 mEq Oral Daily Johnson, Clanford L, MD   40 mEq at 02/19/21 0930   spiritus frumenti (ethyl alcohol) solution 1 each  1 each Oral BID WC Johnson, Clanford L, MD   1 each at 02/19/21 2025   thiamine tablet 100 mg  100 mg Oral Daily Johnson, Clanford L, MD   100 mg at 02/19/21 0930   Or   thiamine (B-1) injection 100 mg  100 mg Intravenous Daily Johnson, Clanford L, MD       traZODone (DESYREL) tablet 50 mg  50 mg Oral QHS PRN Murlean Iba, MD        Allergies as of 02/16/2021   (No Known Allergies)    Family History  Problem Relation Age of Onset   Diabetes Mother    Diabetes Maternal Grandfather    Parkinson's disease Father    Ulcers Neg Hx    Colon cancer Neg Hx     Social History   Socioeconomic History   Marital status: Married    Spouse name: Not on file   Number of children: Not on file   Years of education: Not on file   Highest education level: Not on file  Occupational History   Occupation: [employer] Research officer, trade union  Tobacco Use   Smoking status: Every Day    Packs/day: 0.15    Types: Cigarettes    Start date: 10/04/2015   Smokeless tobacco: Never   Tobacco comments:    he would like to but he knows it is hard   Vaping Use   Vaping Use: Never used  Substance and Sexual Activity   Alcohol use: Yes    Alcohol/week: 4.0 standard drinks    Types: 4 Standard drinks or equivalent per week   Drug use: No   Sexual activity: Not on file  Other Topics Concern   Not on file  Social History Narrative   Not  on file   Social Determinants of Health   Financial Resource Strain: Not on file  Food Insecurity: Not on file  Transportation Needs: Not on file  Physical Activity: Not on file  Stress: Not on file  Social Connections: Not on file  Intimate Partner Violence: Not on file    Review of Systems: Unable to obtain meaningful ROS due to current clinical condition.  Physical Exam: Vital signs in last 24 hours: Temp:  [97.3 F (36.3 C)-98 F (36.7  C)] 98 F (36.7 C) (12/10 0930) Pulse Rate:  [98-113] 109 (12/10 1245) Resp:  [16-20] 20 (12/10 1245) BP: (123-160)/(91-102) 144/102 (12/10 0930) SpO2:  [86 %-98 %] 86 % (12/10 1245) Weight:  [88.6 kg] 88.6 kg (12/10 0446) Last BM Date: 02/16/21 General:   Encephalopathic, nonverbal, does not respond to questions or commands Head:  Normocephalic and atraumatic. Eyes:  Sclera clear, no icterus.   Conjunctiva pink. Ears:  Normal auditory acuity. Nose:  No deformity, discharge,  or lesions. Mouth:  No deformity or lesions, dentition normal. Neck:  Supple; no masses or thyromegaly. Lungs:  Clear throughout to auscultation.   No wheezes, crackles, or rhonchi. No acute distress. Heart:  Regular rate and rhythm; no murmurs, clicks, rubs,  or gallops. Abdomen:  Soft, nontender and nondistended. No masses, hepatosplenomegaly or hernias noted. Normal bowel sounds, without guarding, and without rebound.   Msk:  Symmetrical without gross deformities. Normal posture. Pulses:  Normal pulses noted. Extremities:  Without clubbing or edema. Neurologic:  Alert and  oriented x4;  grossly normal neurologically. Skin:  Intact without significant lesions or rashes. Cervical Nodes:  No significant cervical adenopathy. Psych:  Alert and cooperative. Normal mood and affect.  Intake/Output from previous day: 12/09 0701 - 12/10 0700 In: 916 [P.O.:916] Out: 300 [Urine:300] Intake/Output this shift: No intake/output data recorded.  Lab Results: Recent Labs    02/17/21 0517 02/19/21 0728  WBC 7.7 11.0*  HGB 11.9* 11.9*  HCT 35.6* 35.9*  PLT 240 227   BMET Recent Labs    02/17/21 0517 02/18/21 0829  NA 138 139  K 3.2* 3.7  CL 97* 102  CO2 33* 29  GLUCOSE 87 86  BUN 15 17  CREATININE 1.17 1.02  CALCIUM 8.1* 8.4*   LFT Recent Labs    02/17/21 0517  PROT 5.7*  ALBUMIN 2.1*  AST 31  ALT 21  ALKPHOS 84  BILITOT 0.7   PT/INR No results for input(s): LABPROT, INR in the last 72  hours. Hepatitis Panel No results for input(s): HEPBSAG, HCVAB, HEPAIGM, HEPBIGM in the last 72 hours. C-Diff No results for input(s): CDIFFTOX in the last 72 hours.  Studies/Results: No results found.  Impression: *Odynophagia *Nausea vomiting *Weight loss  Plan: Given patient's worsening above upper GI symptoms, I think further evaluation with EGD is certainly warranted.  However we will wait until his mentation is improved enough to have this done safely.  Case discussed with hospitalist, weaning off medication today in regards to his delirium tremens.  We will follow along with plans on EGD in 48 to 72 hours.  We will hold his anticoagulation in anticipation of procedure.  Agree with repleting vitamins, thiamine, folic acid.  We will increase Protonix to twice daily given his history of duodenal ulcers.  Thank you for the consultation, GI to continue to follow.  Elon Alas. Abbey Chatters, D.O. Gastroenterology and Hepatology [facility]    LOS: 3 days     02/19/2021, 1:22 PM

## 2021-02-19 NOTE — Progress Notes (Signed)
Pt up in recliner after working with PT. Noted pt's SaO2 to be 85-86%, HR 95-108/min. Pt denies any SOB, family member at bedside. Pt's O2 was not on. O2 nasal cannula reapplied at 2lpm [place], SaO2 up 92%. Pt asked to get back in bed. Advised pt and wife that we would like pt to sit up for a little bit longer for muscle strength and improve pulmonary toilet. Wife states understanding.

## 2021-02-19 NOTE — Progress Notes (Addendum)
PROGRESS NOTE   Anthony Little  GYI:[id number] DOB: April 11, 1950 DOA: 02/16/2021 PCP: Kathyrn Drown, MD   Chief Complaint  Patient presents with   Shortness of Breath   Level of care: Telemetry  Brief Admission History:  70 y.o. male with medical history significant for chronic daily smoker, COPD, atrial flutter/atrial fibrillation on apixaban, AAA, stage 3a CKD, hyperlipidemia, prior CVA, recently recovered from influenza A infection from 3 weeks ago presents with progressive shortness of breath, cough, malaise, weakness and he has taken 2 negative covid tests at home.  He presents because symptoms continue to worsen.  He has had fever and chills at home.  He has had nausea but no emesis and no diarrhea.  He denies chest pain.  He has palpitations.  He has no vision changes.  He was hypoxic on arrival down to 88% on room air and placed on 2L/min nasal cannula with improvement in oxygen saturation to 95%.  He reportedly does not use oxygen at home.  His labs were notable for an elevated lactic acid of 2.2.  CXR was notable for infiltrates in LLL and RUL.  He was started on sepsis protocol and IV antibiotics and admission was requested  Assessment & Plan:   Principal Problem:   Community acquired pneumonia Active Problems:   Sepsis ([place])   Brain aneurysm   Alcohol abuse   Essential hypertension   Hyperlipidemia   Thoracic aorta atherosclerosis ([place])   Coronary artery disease involving native heart without angina pectoris   Atrial fibrillation and flutter (HCC)   CKD (chronic kidney disease) stage 3, GFR 30-59 ml/min (HCC)   Abdominal aortic aneurysm (AAA)   Hypokalemia   Lactic acidosis  Sepsis secondary to pneumonia  - sepsis physiology resolved now - lactic acidosis treated and resolved - continue supportive care and IV antibiotics - follow blood cultures and sputum culture:    Community Acquired Pneumonia - likely a postviral pneumonia given recent influenza infection  -  continue IV antibiotics and supportive measures- follow blood cultures: 1/2 with anaerobic bottle G pos cocci possible contaminant, "micro called back and said nothing growing on plates" 12/10 per Isac Sarna pharm D.    Hypokalemia - Repleted  - from HCTZ  - Hold HCTZ for now - oral potassium replacement ordered - Mg has been repleted.   Hypomagnesemia - IV replacement ordered and repleted    Chronic Atrial fibrillation/Atrial Flutter  - heart rates have been labile in setting of acute alcohol withdrawal - increased metoprolol XL to 50 mg daily starting 12/10 - resumed apixaban for full anticoagulation : HOLDING apixaban temporarily for EGD on 12/12   Hyperlipidemia - resumed home atorvastatin   Essential hypertension -suboptimally controlled -increased metoprolol XL to 50 mg on 12/10   Tobacco use - nicotine patch ordered while in hospital - counseled on tobacco cessation   Alcohol abuse with acute alcohol withdrawal delirium tremens - wife reported that he drinks daily alcohol and heavily every night - she reported recently stopping his alcohol consumption at home leading to hallucinations - CIWA protocol started and spiritus frumenti ordered TID - repleting vitamins, thiamine, folic acid  - continue librium tablets TID but reduce dose by 50% to 5 mg as he is improving on 12/10 - as he is improving, reduced dose of spiritus frumenti to BID on 12/10  DVT prophylaxis: apixaban (temporarily on hold for EGD) SCDs ordered, ambulation ordered Code Status: Full  Family Communication: wife updated at bedside  Disposition: anticipate DC  home in 1-2 days after GI evaluation completed  Status is: Inpatient  Remains inpatient appropriate because: IV antibiotics and IV fluids, awaiting GI evaluation 12/10   Consultants:  N/a  Procedures:  N/a  Antimicrobials:  Ceftriaxone 12/7>> Azithromycin 12/7>>   Subjective: Pt reports ongoing difficulty with swallowing, pain with  eating, 20 pound weight loss, requesting to see GI, still with cough and chest congestion.     Objective: Vitals:   02/18/21 1933 02/19/21 0446 02/19/21 0836 02/19/21 0930  BP:  (!) 160/91  (!) 144/102  Pulse:  (!) 105  (!) 113  Resp:  16  20  Temp:  (!) 97.3 F (36.3 C)  98 F (36.7 C)  TempSrc:  Oral  Oral  SpO2: 95% 98% (!) 88% 94%  Weight:  88.6 kg    Height:  5\' 10"  (1.778 m)      Intake/Output Summary (Last 24 hours) at 02/19/2021 1114 Last data filed at 02/19/2021 0600 Gross per 24 hour  Intake 676 ml  Output 300 ml  Net 376 ml   Filed Weights   02/16/21 1141 02/19/21 0446  Weight: 88.5 kg 88.6 kg    Examination:  General exam: thin emaciated appearing male, awake, loud congested cough, Appears calm and comfortable. No resting tremor today. Less somnolent but arousable.  Respiratory system: rales RLL, No increased work of breathing. Cardiovascular system: normal S1 & S2 heard. Tachycardic rate, No JVD, murmurs, rubs, gallops or clicks. No pedal edema. Gastrointestinal system: Abdomen is nondistended, soft and nontender. No organomegaly or masses felt. Normal bowel sounds heard. Central nervous system: Alert and oriented. No focal neurological deficits. Extremities: Symmetric 5 x 5 power. Skin: No rashes, lesions or ulcers Psychiatry: Judgement and insight appear poor. Mood & affect appropriate.   Data Reviewed: I have personally reviewed following labs and imaging studies  CBC: Recent Labs  Lab 02/16/21 1208 02/17/21 0517 02/19/21 0728  WBC 9.5 7.7 11.0*  NEUTROABS 7.5 6.1  --   HGB 14.0 11.9* 11.9*  HCT 41.3 35.6* 35.9*  MCV 108.1* 110.2* 111.8*  PLT 275 240 626    Basic Metabolic Panel: Recent Labs  Lab 02/16/21 1200 02/16/21 1208 02/17/21 0517 02/18/21 0829  NA  --  134* 138 139  K  --  2.4* 3.2* 3.7  CL  --  84* 97* 102  CO2  --  38* 33* 29  GLUCOSE  --  109* 87 86  BUN  --  15 15 17   CREATININE  --  1.30* 1.17 1.02  CALCIUM  --  8.7*  8.1* 8.4*  MG 1.8  --  1.6* 2.2    GFR: Estimated Creatinine Clearance: 76.6 mL/min (by C-G formula based on SCr of 1.02 mg/dL).  Liver Function Tests: Recent Labs  Lab 02/16/21 1208 02/17/21 0517  AST 47* 31  ALT 30 21  ALKPHOS 109 84  BILITOT 0.7 0.7  PROT 7.2 5.7*  ALBUMIN 2.6* 2.1*    CBG: No results for input(s): GLUCAP in the last 168 hours.  Recent Results (from the past 240 hour(s))  Culture, blood (Routine x 2)     Status: None (Preliminary result)   Collection Time: 02/16/21 12:08 PM   Specimen: BLOOD  Result Value Ref Range Status   Specimen Description   Final    BLOOD BLOOD LEFT FOREARM Performed at [facility], [address], [place], [place] [address]    Special Requests   Final    BOTTLES DRAWN AEROBIC AND ANAEROBIC Blood  Culture results may not be optimal due to an excessive volume of blood received in culture bottles Performed at [facility], [address] [place], [place] [address]    Culture  Setup Time   Final    CORRECTED RESULTS PREVIOUSLY REPORTED AS: GRAM POSITIVE COCCI Gram Stain Report Called to,Read Back By and Verified With: TIA PARRIS @ 1931 ON 02/16/21 C VARNER CORRECTED RESULTS CALLED TO: PHARM D L.POOLE ON [id number] AT 2671 BY E.PARRISH    Culture   Final    NO GROWTH 3 DAYS Performed at [facility], [place] [address] [place], [place] [address]    Report Status PENDING  Incomplete  Blood Culture ID Panel (Reflexed)     Status: None   Collection Time: 02/16/21 12:08 PM  Result Value Ref Range Status   Enterococcus faecalis NOT DETECTED NOT DETECTED Final   Enterococcus Faecium NOT DETECTED NOT DETECTED Final   Listeria monocytogenes NOT DETECTED NOT DETECTED Final   Staphylococcus species NOT DETECTED NOT DETECTED Final   Staphylococcus aureus (BCID) NOT DETECTED NOT DETECTED Final   Staphylococcus epidermidis NOT DETECTED NOT DETECTED Final   Staphylococcus lugdunensis NOT DETECTED NOT DETECTED Final   Streptococcus  species NOT DETECTED NOT DETECTED Final   Streptococcus agalactiae NOT DETECTED NOT DETECTED Final   Streptococcus pneumoniae NOT DETECTED NOT DETECTED Final   Streptococcus pyogenes NOT DETECTED NOT DETECTED Final   A.calcoaceticus-baumannii NOT DETECTED NOT DETECTED Final   Bacteroides fragilis NOT DETECTED NOT DETECTED Final   Enterobacterales NOT DETECTED NOT DETECTED Final   Enterobacter cloacae complex NOT DETECTED NOT DETECTED Final   Escherichia coli NOT DETECTED NOT DETECTED Final   Klebsiella aerogenes NOT DETECTED NOT DETECTED Final   Klebsiella oxytoca NOT DETECTED NOT DETECTED Final   Klebsiella pneumoniae NOT DETECTED NOT DETECTED Final   Proteus species NOT DETECTED NOT DETECTED Final   Salmonella species NOT DETECTED NOT DETECTED Final   Serratia marcescens NOT DETECTED NOT DETECTED Final   Haemophilus influenzae NOT DETECTED NOT DETECTED Final   Neisseria meningitidis NOT DETECTED NOT DETECTED Final   Pseudomonas aeruginosa NOT DETECTED NOT DETECTED Final   Stenotrophomonas maltophilia NOT DETECTED NOT DETECTED Final   Candida albicans NOT DETECTED NOT DETECTED Final   Candida auris NOT DETECTED NOT DETECTED Final   Candida glabrata NOT DETECTED NOT DETECTED Final   Candida krusei NOT DETECTED NOT DETECTED Final   Candida parapsilosis NOT DETECTED NOT DETECTED Final   Candida tropicalis NOT DETECTED NOT DETECTED Final   Cryptococcus neoformans/gattii NOT DETECTED NOT DETECTED Final    Comment: Performed at [facility], [address] [place], [place] [address]  Culture, blood (Routine x 2)     Status: None (Preliminary result)   Collection Time: 02/16/21 12:09 PM   Specimen: BLOOD  Result Value Ref Range Status   Specimen Description BLOOD BLOOD RIGHT FOREARM  Final   Special Requests   Final    BOTTLES DRAWN AEROBIC AND ANAEROBIC Blood Culture adequate volume   Culture   Final    NO GROWTH 3 DAYS Performed at [facility], [address], [place],   [address]    Report Status PENDING  Incomplete  Resp Panel by RT-PCR (Flu A&B, Covid) Nasopharyngeal Swab     Status: None   Collection Time: 02/16/21 12:36 PM   Specimen: Nasopharyngeal Swab; Nasopharyngeal(NP) swabs in vial transport medium  Result Value Ref Range Status   SARS Coronavirus 2 by RT PCR NEGATIVE NEGATIVE Final  Comment: (NOTE) SARS-CoV-2 target nucleic acids are NOT DETECTED.  The SARS-CoV-2 RNA is generally detectable in upper respiratory specimens during the acute phase of infection. The lowest concentration of SARS-CoV-2 viral copies this assay can detect is 138 copies/mL. A negative result does not preclude SARS-Cov-2 infection and should not be used as the sole basis for treatment or other patient management decisions. A negative result may occur with  improper specimen collection/handling, submission of specimen other than nasopharyngeal swab, presence of viral mutation(s) within the areas targeted by this assay, and inadequate number of viral copies(<138 copies/mL). A negative result must be combined with clinical observations, patient history, and epidemiological information. The expected result is Negative.  Fact Sheet for Patients:  EntrepreneurPulse.com.au  Fact Sheet for Healthcare Providers:  IncredibleEmployment.be  This test is no t yet approved or cleared by the [place] FDA and  has been authorized for detection and/or diagnosis of SARS-CoV-2 by FDA under an Emergency Use Authorization (EUA). This EUA will remain  in effect (meaning this test can be used) for the duration of the COVID-19 declaration under Section 564(b)(1) of the Act, 21 U.S.C.section 360bbb-3(b)(1), unless the authorization is terminated  or revoked sooner.       Influenza A by PCR NEGATIVE NEGATIVE Final   Influenza B by PCR NEGATIVE NEGATIVE Final    Comment: (NOTE) The Xpert Xpress SARS-CoV-2/FLU/RSV plus assay is intended as an  aid in the diagnosis of influenza from Nasopharyngeal swab specimens and should not be used as a sole basis for treatment. Nasal washings and aspirates are unacceptable for Xpert Xpress SARS-CoV-2/FLU/RSV testing.  Fact Sheet for Patients: EntrepreneurPulse.com.au  Fact Sheet for Healthcare Providers: IncredibleEmployment.be  This test is not yet approved or cleared by the [place] FDA and has been authorized for detection and/or diagnosis of SARS-CoV-2 by FDA under an Emergency Use Authorization (EUA). This EUA will remain in effect (meaning this test can be used) for the duration of the COVID-19 declaration under Section 564(b)(1) of the Act, 21 U.S.C. section 360bbb-3(b)(1), unless the authorization is terminated or revoked.  Performed at [facility], [address], [place], [place] [address]     Radiology Studies: No results found.  Scheduled Meds:  amLODipine  5 mg Oral Daily   apixaban  5 mg Oral BID   atorvastatin  10 mg Oral QPM   buPROPion  150 mg Oral Daily   chlordiazePOXIDE  5 mg Oral TID   dextromethorphan  30 mg Oral BID   feeding supplement  237 mL Oral BID BM   folic acid  1 mg Oral Daily   guaiFENesin  1,200 mg Oral BID   influenza vaccine adjuvanted  0.5 mL Intramuscular Tomorrow-1000   ipratropium-albuterol  3 mL Nebulization TID   metoprolol succinate  50 mg Oral Daily   multivitamin with minerals  1 tablet Oral Daily   nicotine  21 mg Transdermal Daily   pantoprazole  40 mg Oral Q0600   potassium chloride SA  40 mEq Oral Daily   spiritus frumenti  1 each Oral BID WC   thiamine  100 mg Oral Daily   Or   thiamine  100 mg Intravenous Daily   Continuous Infusions:  0.9 % NaCl with KCl 20 mEq / L 35 mL/hr at 02/18/21 1226   azithromycin 500 mg (02/18/21 1310)   cefTRIAXone (ROCEPHIN)  IV 2 g (02/18/21 1414)    LOS: 3 days   Time spent: 35 mins   Keiran Sias Wynetta Emery, MD How  to contact the [facility] Attending or  Consulting provider [place] or covering provider during after hours [place], for this patient?  Check the care team in [facility] and look for a) attending/consulting [facility] provider listed and b) the [facility] team listed Log into [email or web address] and use [place]'s universal password to access. If you do not have the password, please contact the hospital operator. Locate the [facility] provider you are looking for under [facility] and page to a number that you can be directly reached. If you still have difficulty reaching the provider, please page the [facility] (Director on Call) for the Hospitalists listed on amion for assistance.  02/19/2021, 11:14 AM

## 2021-02-19 NOTE — Plan of Care (Signed)
  Problem: Acute Rehab PT Goals(only PT should resolve) Goal: Pt Will Go Supine/Side To Sit Outcome: Progressing Flowsheets (Taken 02/19/2021 1303) Pt will go Supine/Side to Sit:  with modified independence  Independently Goal: Patient Will Transfer Sit To/From Stand Outcome: Progressing Flowsheets (Taken 02/19/2021 1303) Patient will transfer sit to/from stand:  with modified independence  with supervision Goal: Pt Will Transfer Bed To Chair/Chair To Bed Outcome: Progressing Flowsheets (Taken 02/19/2021 1303) Pt will Transfer Bed to Chair/Chair to Bed:  with modified independence  with supervision Goal: Pt Will Ambulate Outcome: Progressing Flowsheets (Taken 02/19/2021 1303) Pt will Ambulate:  75 feet  with modified independence  with supervision  with rolling walker  with least restrictive assistive device   1:04 PM, 02/19/21 Lonell Grandchild, MPT Physical Therapist with [facility] [phone] office [phone] mobile phone

## 2021-02-19 NOTE — H&P (View-Only) (Signed)
Consulting  Provider: Dr. Jenny Reichmann Primary Care Physician:  Kathyrn Drown, MD Primary Gastroenterologist:  Dr. Abbey Chatters  Reason for Consultation: Odynophagia, nausea vomiting, weight loss  HPI:  Anthony Little is a 70 y.o. male with a past medical history of atrial fibrillation chronically on apixaban, CKD, dyslipidemia, prior CVA, significant alcohol abuse, tobacco use, COPD, who was admitted to [facility] 02/16/2021 due to progressively worsening shortness of breath, cough, generalized weakness/fatigue.  Found to have sepsis with community-acquired pneumonia which he has been treated for.  Subsequently went into alcohol withdrawal and delirium tremens now on Librium.  Patient's wife who is bedside provides majority of HPI.  States he drinks daily alcohol and heavily every night.  Has been receiving thiamine, folic acid, as well as vitamins.  Reason for consultation today is odynophagia, nausea vomiting, weight loss.  Patient's wife states for the last 2 to 3 weeks patient has basically refused to eat.  States any times he eats he has pain in his throat as well as nausea vomiting.  Notes patient has lost nearly 20 pounds and she is concerned.  Last EGD 11/01/2015 showed multiple small duodenal ulcers.  Gastric biopsies negative for H. pylori.  Patient currently not any chronic antiacid medications.  No chronic NSAID use.  Past Medical History:  Diagnosis Date   Abdominal aortic aneurysm (AAA)    Atrial fibrillation and flutter (HCC)    CKD (chronic kidney disease) stage 3, GFR 30-59 ml/min (HCC)    CVA (cerebral infarction)    Essential hypertension    Hyperlipidemia    Thoracic aortic aneurysm     Past Surgical History:  Procedure Laterality Date   BIOPSY  08/17/2020   Procedure: BIOPSY;  Surgeon: Eloise Harman, DO;  [facility]: [facility];  Service: Endoscopy;;   COLONOSCOPY  12/2009   Diverticulosis, mild internal hemorrhoids, multiple colon polyps removed (adenomatous).  One irregular shaped polyp was tattooed. Next colonoscopy due October 2016.   COLONOSCOPY N/A 11/01/2015   Procedure: COLONOSCOPY;  Surgeon: Danie Binder, MD;  Location: [facility];  Service: Endoscopy;  Laterality: N/A;  100   COLONOSCOPY WITH PROPOFOL N/A 08/17/2020   Procedure: COLONOSCOPY WITH PROPOFOL;  Surgeon: Eloise Harman, DO;  Location: [facility];  Service: Endoscopy;  Laterality: N/A;  2:15pm   ESOPHAGOGASTRODUODENOSCOPY N/A 10/08/2012   SLF: Moderate erosive gastritis/duodenitis. Biopsies benign.   ESOPHAGOGASTRODUODENOSCOPY N/A 11/01/2015   Procedure: ESOPHAGOGASTRODUODENOSCOPY (EGD);  Surgeon: Danie Binder, MD;  Location: [facility];  Service: Endoscopy;  Laterality: N/A;   POLYPECTOMY  08/17/2020   Procedure: POLYPECTOMY;  Surgeon: Eloise Harman, DO;  Location: [facility];  Service: Endoscopy;;    Prior to Admission medications   Medication Sig Start Date End Date Taking? Authorizing Provider  amLODipine (NORVASC) 5 MG tablet Take 5 mg by mouth daily. 10/21/18  Yes [provider]  apixaban (ELIQUIS) 5 MG TABS tablet Take 1 tablet (5 mg total) by mouth 2 (two) times daily. 10/18/20  Yes Verta Ellen., NP  atorvastatin (LIPITOR) 10 MG tablet Take 10 mg by mouth daily. 12/02/18  Yes [provider]  buPROPion (WELLBUTRIN XL) 150 MG 24 hr tablet TAKE 1 TABLET BY MOUTH EVERY DAY 01/26/21  Yes Luking, Elayne Snare, MD  glucosamine-chondroitin 500-400 MG tablet Take 2 tablets by mouth every morning.   Yes [provider]  hydrochlorothiazide (MICROZIDE) 12.5 MG capsule TAKE 1 CAPSULE BY MOUTH EVERY DAY IN THE MORNING 09/23/20  Yes Kathyrn Drown, MD  metoprolol succinate (TOPROL-XL) 25 MG 24 hr tablet TAKE 1 TABLET (25 MG TOTAL) BY MOUTH DAILY. 09/23/20  Yes Luking, Scott A, MD  mometasone (ELOCON) 0.1 % cream Apply thin amount to affected area BID prn. Not for face or groin Patient taking differently: Apply 1 application topically 2 (two)  times daily as needed (irritation). Not for face or groin 01/26/21  Yes Luking, Scott A, MD  potassium chloride (KLOR-CON) 10 MEQ tablet Take 10 mEq by mouth daily. 01/19/20  Yes [provider]  triamcinolone cream (KENALOG) 0.1 % Apply twice daily PRN Patient taking differently: Apply 1 application topically 2 (two) times daily as needed (irritation). Apply twice daily PRN 08/09/18  Yes Kathyrn Drown, MD    Current Facility-Administered Medications  Medication Dose Route Frequency Provider Last Rate Last Admin   0.9 % NaCl with KCl 20 mEq/ L  infusion   Intravenous Continuous Wynetta Emery, Clanford L, MD 35 mL/hr at 02/18/21 1226 New Bag at 02/18/21 1226   acetaminophen (TYLENOL) tablet 650 mg  650 mg Oral Q6H PRN Wynetta Emery, Clanford L, MD   650 mg at 02/16/21 1637   Or   acetaminophen (TYLENOL) suppository 650 mg  650 mg Rectal Q6H PRN Johnson, Clanford L, MD       albuterol (PROVENTIL) (2.5 MG/3ML) 0.083% nebulizer solution 2.5 mg  2.5 mg Nebulization Q6H PRN Johnson, Clanford L, MD       amLODipine (NORVASC) tablet 5 mg  5 mg Oral Daily Johnson, Clanford L, MD   5 mg at 02/19/21 0930   apixaban (ELIQUIS) tablet 5 mg  5 mg Oral BID Johnson, Clanford L, MD   5 mg at 02/19/21 0930   atorvastatin (LIPITOR) tablet 10 mg  10 mg Oral QPM Johnson, Clanford L, MD   10 mg at 02/18/21 1811   azithromycin (ZITHROMAX) 500 mg in sodium chloride 0.9 % 250 mL IVPB  500 mg Intravenous Q24H Johnson, Clanford L, MD 250 mL/hr at 02/18/21 1310 500 mg at 02/18/21 1310   bisacodyl (DULCOLAX) EC tablet 5 mg  5 mg Oral Daily PRN Wynetta Emery, Clanford L, MD       buPROPion (WELLBUTRIN XL) 24 hr tablet 150 mg  150 mg Oral Daily Johnson, Clanford L, MD   150 mg at 02/19/21 0930   cefTRIAXone (ROCEPHIN) 2 g in sodium chloride 0.9 % 100 mL IVPB  2 g Intravenous Q24H Johnson, Clanford L, MD 200 mL/hr at 02/18/21 1414 2 g at 02/18/21 1414   chlordiazePOXIDE (LIBRIUM) capsule 5 mg  5 mg Oral TID Wynetta Emery, Clanford L, MD   5 mg  at 02/19/21 0946   dextromethorphan (DELSYM) 30 MG/5ML liquid 30 mg  30 mg Oral BID Johnson, Clanford L, MD   30 mg at 02/19/21 0931   feeding supplement (ENSURE ENLIVE / ENSURE PLUS) liquid 237 mL  237 mL Oral BID BM Johnson, Clanford L, MD   237 mL at 99/83/38 2505   folic acid (FOLVITE) tablet 1 mg  1 mg Oral Daily Johnson, Clanford L, MD   1 mg at 02/19/21 0930   guaiFENesin (MUCINEX) 12 hr tablet 1,200 mg  1,200 mg Oral BID Johnson, Clanford L, MD   1,200 mg at 02/19/21 0930   guaiFENesin (ROBITUSSIN) 100 MG/5ML liquid 5 mL  5 mL Oral Q4H PRN Zierle-Ghosh, Asia B, DO   5 mL at 02/17/21 3976   influenza vaccine adjuvanted (FLUAD) injection 0.5 mL  0.5 mL Intramuscular Tomorrow-1000 Wynetta Emery,  Clanford L, MD       ipratropium-albuterol (DUONEB) 0.5-2.5 (3) MG/3ML nebulizer solution 3 mL  3 mL Nebulization TID Wynetta Emery, Clanford L, MD   3 mL at 02/19/21 0836   LORazepam (ATIVAN) tablet 1-4 mg  1-4 mg Oral Q1H PRN Wynetta Emery, Clanford L, MD   1 mg at 02/18/21 1615   Or   LORazepam (ATIVAN) injection 1-4 mg  1-4 mg Intravenous Q1H PRN Wynetta Emery, Clanford L, MD   3 mg at 02/17/21 1809   metoprolol succinate (TOPROL-XL) 24 hr tablet 50 mg  50 mg Oral Daily Johnson, Clanford L, MD   50 mg at 02/19/21 0930   multivitamin with minerals tablet 1 tablet  1 tablet Oral Daily Johnson, Clanford L, MD   1 tablet at 02/19/21 0930   nicotine (NICODERM CQ - dosed in mg/24 hours) patch 21 mg  21 mg Transdermal Daily Johnson, Clanford L, MD   21 mg at 02/19/21 0937   ondansetron (ZOFRAN) tablet 4 mg  4 mg Oral Q6H PRN Johnson, Clanford L, MD       Or   ondansetron (ZOFRAN) injection 4 mg  4 mg Intravenous Q6H PRN Johnson, Clanford L, MD       oxyCODONE (Oxy IR/ROXICODONE) immediate release tablet 5 mg  5 mg Oral Q6H PRN Johnson, Clanford L, MD   5 mg at 02/16/21 1826   pantoprazole (PROTONIX) EC tablet 40 mg  40 mg Oral Q0600 Johnson, Clanford L, MD   40 mg at 02/19/21 0946   potassium chloride SA (KLOR-CON M) CR tablet  40 mEq  40 mEq Oral Daily Johnson, Clanford L, MD   40 mEq at 02/19/21 0930   spiritus frumenti (ethyl alcohol) solution 1 each  1 each Oral BID WC Johnson, Clanford L, MD   1 each at 02/19/21 4270   thiamine tablet 100 mg  100 mg Oral Daily Johnson, Clanford L, MD   100 mg at 02/19/21 0930   Or   thiamine (B-1) injection 100 mg  100 mg Intravenous Daily Johnson, Clanford L, MD       traZODone (DESYREL) tablet 50 mg  50 mg Oral QHS PRN Murlean Iba, MD        Allergies as of 02/16/2021   (No Known Allergies)    Family History  Problem Relation Age of Onset   Diabetes Mother    Diabetes Maternal Grandfather    Parkinson's disease Father    Ulcers Neg Hx    Colon cancer Neg Hx     Social History   Socioeconomic History   Marital status: Married    Spouse name: Not on file   Number of children: Not on file   Years of education: Not on file   Highest education level: Not on file  Occupational History   Occupation: [employer] Research officer, trade union  Tobacco Use   Smoking status: Every Day    Packs/day: 0.15    Types: Cigarettes    Start date: 10/04/2015   Smokeless tobacco: Never   Tobacco comments:    he would like to but he knows it is hard   Vaping Use   Vaping Use: Never used  Substance and Sexual Activity   Alcohol use: Yes    Alcohol/week: 4.0 standard drinks    Types: 4 Standard drinks or equivalent per week   Drug use: No   Sexual activity: Not on file  Other Topics Concern   Not on file  Social History Narrative   Not  on file   Social Determinants of Health   Financial Resource Strain: Not on file  Food Insecurity: Not on file  Transportation Needs: Not on file  Physical Activity: Not on file  Stress: Not on file  Social Connections: Not on file  Intimate Partner Violence: Not on file    Review of Systems: Unable to obtain meaningful ROS due to current clinical condition.  Physical Exam: Vital signs in last 24 hours: Temp:  [97.3 F (36.3 C)-98 F (36.7  C)] 98 F (36.7 C) (12/10 0930) Pulse Rate:  [98-113] 109 (12/10 1245) Resp:  [16-20] 20 (12/10 1245) BP: (123-160)/(91-102) 144/102 (12/10 0930) SpO2:  [86 %-98 %] 86 % (12/10 1245) Weight:  [88.6 kg] 88.6 kg (12/10 0446) Last BM Date: 02/16/21 General:   Encephalopathic, nonverbal, does not respond to questions or commands Head:  Normocephalic and atraumatic. Eyes:  Sclera clear, no icterus.   Conjunctiva pink. Ears:  Normal auditory acuity. Nose:  No deformity, discharge,  or lesions. Mouth:  No deformity or lesions, dentition normal. Neck:  Supple; no masses or thyromegaly. Lungs:  Clear throughout to auscultation.   No wheezes, crackles, or rhonchi. No acute distress. Heart:  Regular rate and rhythm; no murmurs, clicks, rubs,  or gallops. Abdomen:  Soft, nontender and nondistended. No masses, hepatosplenomegaly or hernias noted. Normal bowel sounds, without guarding, and without rebound.   Msk:  Symmetrical without gross deformities. Normal posture. Pulses:  Normal pulses noted. Extremities:  Without clubbing or edema. Neurologic:  Alert and  oriented x4;  grossly normal neurologically. Skin:  Intact without significant lesions or rashes. Cervical Nodes:  No significant cervical adenopathy. Psych:  Alert and cooperative. Normal mood and affect.  Intake/Output from previous day: 12/09 0701 - 12/10 0700 In: 916 [P.O.:916] Out: 300 [Urine:300] Intake/Output this shift: No intake/output data recorded.  Lab Results: Recent Labs    02/17/21 0517 02/19/21 0728  WBC 7.7 11.0*  HGB 11.9* 11.9*  HCT 35.6* 35.9*  PLT 240 227   BMET Recent Labs    02/17/21 0517 02/18/21 0829  NA 138 139  K 3.2* 3.7  CL 97* 102  CO2 33* 29  GLUCOSE 87 86  BUN 15 17  CREATININE 1.17 1.02  CALCIUM 8.1* 8.4*   LFT Recent Labs    02/17/21 0517  PROT 5.7*  ALBUMIN 2.1*  AST 31  ALT 21  ALKPHOS 84  BILITOT 0.7   PT/INR No results for input(s): LABPROT, INR in the last 72  hours. Hepatitis Panel No results for input(s): HEPBSAG, HCVAB, HEPAIGM, HEPBIGM in the last 72 hours. C-Diff No results for input(s): CDIFFTOX in the last 72 hours.  Studies/Results: No results found.  Impression: *Odynophagia *Nausea vomiting *Weight loss  Plan: Given patient's worsening above upper GI symptoms, I think further evaluation with EGD is certainly warranted.  However we will wait until his mentation is improved enough to have this done safely.  Case discussed with hospitalist, weaning off medication today in regards to his delirium tremens.  We will follow along with plans on EGD in 48 to 72 hours.  We will hold his anticoagulation in anticipation of procedure.  Agree with repleting vitamins, thiamine, folic acid.  We will increase Protonix to twice daily given his history of duodenal ulcers.  Thank you for the consultation, GI to continue to follow.  Elon Alas. Abbey Chatters, D.O. Gastroenterology and Hepatology [facility]    LOS: 3 days     02/19/2021, 1:22 PM

## 2021-02-20 ENCOUNTER — Inpatient Hospital Stay (HOSPITAL_COMMUNITY): Payer: Medicare Other

## 2021-02-20 DIAGNOSIS — R112 Nausea with vomiting, unspecified: Secondary | ICD-10-CM

## 2021-02-20 DIAGNOSIS — J189 Pneumonia, unspecified organism: Secondary | ICD-10-CM

## 2021-02-20 DIAGNOSIS — R1310 Dysphagia, unspecified: Secondary | ICD-10-CM

## 2021-02-20 MED ORDER — DEXTROMETHORPHAN POLISTIREX ER 30 MG/5ML PO SUER: 30.0000 mg | Freq: Two times a day (BID) | ORAL | Status: DC | PRN

## 2021-02-20 MED ORDER — SPIRITUS FRUMENTI
1.0000 | Freq: Every day | ORAL | Status: DC | PRN
  Filled 2021-02-20 (×2): qty 1

## 2021-02-20 MED ORDER — DOXYCYCLINE HYCLATE 100 MG PO TABS
100.0000 mg | ORAL_TABLET | Freq: Two times a day (BID) | ORAL | Status: DC
  Administered 2021-02-21 – 2021-02-22 (×2): 100 mg via ORAL
  Filled 2021-02-20 (×2): qty 1

## 2021-02-20 MED ORDER — IPRATROPIUM-ALBUTEROL 0.5-2.5 (3) MG/3ML IN SOLN
3.0000 mL | Freq: Two times a day (BID) | RESPIRATORY_TRACT | Status: DC
  Administered 2021-02-20 – 2021-02-22 (×3): 3 mL via RESPIRATORY_TRACT
  Filled 2021-02-20 (×4): qty 3

## 2021-02-20 NOTE — Progress Notes (Signed)
Subjective: Patient much more alert today.  Answers questions appropriately.  Still requiring 2 L oxygen nasal cannula.  Objective: Vital signs in last 24 hours: Temp:  [97.8 F (36.6 C)-98.5 F (36.9 C)] 97.8 F (36.6 C) (12/11 0254) Pulse Rate:  [89-105] 89 (12/11 1200) Resp:  [16-20] 16 (12/11 0254) BP: (125-143)/(85-108) 134/85 (12/11 1200) SpO2:  [89 %-96 %] 89 % (12/11 0729) Weight:  [89.4 kg] 89.4 kg (12/11 0700) Last BM Date: 02/16/21 General:   Alert and oriented, pleasant Head:  Normocephalic and atraumatic. Eyes:  No icterus, sclera clear. Conjuctiva pink.  Abdomen:  Bowel sounds present, soft, non-tender, non-distended. No HSM or hernias noted. No rebound or guarding. No masses appreciated  Msk:  Symmetrical without gross deformities. Normal posture. Extremities:  Without clubbing or edema. Neurologic:  Alert and  oriented x4;  grossly normal neurologically. Skin:  Warm and dry, intact without significant lesions.  Cervical Nodes:  No significant cervical adenopathy. Psych:  Alert and cooperative. Normal mood and affect.  Intake/Output from previous day: 12/10 0701 - 12/11 0700 In: 3171 [P.O.:1320; I.V.:1851] Out: 1300 [Urine:1300] Intake/Output this shift: Total I/O In: 360 [P.O.:360] Out: 600 [Urine:600]  Lab Results: Recent Labs    02/19/21 0728  WBC 11.0*  HGB 11.9*  HCT 35.9*  PLT 227   BMET Recent Labs    02/18/21 0829  NA 139  K 3.7  CL 102  CO2 29  GLUCOSE 86  BUN 17  CREATININE 1.02  CALCIUM 8.4*   LFT No results for input(s): PROT, ALBUMIN, AST, ALT, ALKPHOS, BILITOT, BILIDIR, IBILI in the last 72 hours. PT/INR No results for input(s): LABPROT, INR in the last 72 hours. Hepatitis Panel No results for input(s): HEPBSAG, HCVAB, HEPAIGM, HEPBIGM in the last 72 hours.   Studies/Results: DG Chest 2 View  Result Date: 02/20/2021 CLINICAL DATA:  Shortness of breath, cough. EXAM: CHEST - 2 VIEW COMPARISON:  February 16, 2021. FINDINGS:  Stable cardiomediastinal silhouette. Interval development of large bilateral patchy airspace opacities are noted, most prominently seen in the right upper lobe and left lower lobe, consistent with multifocal pneumonia. Small left pleural effusion may be present. Bony thorax is unremarkable. IMPRESSION: Interval development of bilateral lung opacities most consistent with multifocal pneumonia. Small left pleural effusion. Electronically Signed   By: Marijo Conception M.D.   On: 02/20/2021 09:43    AImpression: *Odynophagia *Nausea vomiting *Weight loss *Multifocal pneumonia    Plan: Given patient's worsening above upper GI symptoms, I think further evaluation with EGD is certainly warranted.   Patient much more alert today.  We will tentatively plan on EGD to further evaluate tomorrow assuming his respiratory status remains stable.   Agree with repleting vitamins, thiamine, folic acid.   Continue on Protonix twice daily given his history of duodenal ulcers.  GI to continue to follow.   Elon Alas. Abbey Chatters, D.O. Gastroenterology and Hepatology [facility]   LOS: 4 days    02/20/2021, 12:48 PM

## 2021-02-20 NOTE — Progress Notes (Signed)
NT noticed blood in urine. MD notified and urine cultures ordered and collected.

## 2021-02-20 NOTE — Progress Notes (Signed)
Informed consent for pts EDG on 12/12 signed by pts wife due to patient being confused.

## 2021-02-20 NOTE — Progress Notes (Signed)
PROGRESS NOTE   Anthony Little  IRJ:[id number] DOB: October 28, 1950 DOA: 02/16/2021 PCP: Kathyrn Drown, MD   Chief Complaint  Patient presents with   Shortness of Breath   Level of care: Med-Surg  Brief Admission History:  70 y.o. male with medical history significant for chronic daily smoker, COPD, atrial flutter/atrial fibrillation on apixaban, AAA, stage 3a CKD, hyperlipidemia, prior CVA, recently recovered from influenza A infection from 3 weeks ago presents with progressive shortness of breath, cough, malaise, weakness and he has taken 2 negative covid tests at home.  He presents because symptoms continue to worsen.  He has had fever and chills at home.  He has had nausea but no emesis and no diarrhea.  He denies chest pain.  He has palpitations.  He has no vision changes.  He was hypoxic on arrival down to 88% on room air and placed on 2L/min nasal cannula with improvement in oxygen saturation to 95%.  He reportedly does not use oxygen at home.  His labs were notable for an elevated lactic acid of 2.2.  CXR was notable for infiltrates in LLL and RUL.  He was started on sepsis protocol and IV antibiotics and admission was requested  Assessment & Plan:   Principal Problem:   Community acquired pneumonia Active Problems:   Sepsis ([place])   Brain aneurysm   Alcohol abuse   Essential hypertension   Hyperlipidemia   Thoracic aorta atherosclerosis ([place])   Coronary artery disease involving native heart without angina pectoris   Atrial fibrillation and flutter (HCC)   CKD (chronic kidney disease) stage 3, GFR 30-59 ml/min (HCC)   Abdominal aortic aneurysm (AAA)   Hypokalemia   Lactic acidosis  Sepsis secondary to pneumonia  - sepsis physiology resolved now - lactic acidosis treated and resolved - continue supportive care and IV antibiotics - follow blood cultures and sputum culture:    Community Acquired Pneumonia - likely a postviral pneumonia given recent influenza infection  -  continue IV antibiotics and supportive measures- follow blood cultures: 1/2 with anaerobic bottle G pos cocci possible contaminant, "micro called back and said nothing growing on plates" 12/10 per Isac Sarna pharm D.  - complete IV abx 12/11 and start oral doxycycline 12/12    Hypokalemia - Repleted  - from HCTZ  - Hold HCTZ for now - oral potassium replacement ordered - Mg has been repleted.   Hypomagnesemia - IV replacement ordered and repleted    Chronic Atrial fibrillation/Atrial Flutter  - heart rates have been labile in setting of acute alcohol withdrawal - increased metoprolol XL to 50 mg daily starting 12/10 - resumed apixaban for full anticoagulation : HOLDING apixaban temporarily for EGD on 12/12 - intermittent tachycardia thought secondary to albuterol nebulizer treatments which are being weaned.    Hyperlipidemia - resumed home atorvastatin   Essential hypertension -suboptimally controlled -increased metoprolol XL to 50 mg on 12/10   Tobacco use - nicotine patch ordered while in hospital - counseled on tobacco cessation   Alcohol abuse with acute alcohol withdrawal delirium tremens - wife reported that he drinks daily alcohol and heavily every night - she reported recently stopping his alcohol consumption at home leading to hallucinations - CIWA protocol started and spiritus frumenti ordered TID - repleting vitamins, thiamine, folic acid  - continue librium tablets TID but reduce dose by 50% to 5 mg PRN only as he is improving on 12/10 - as he is improving, reduced dose of spiritus frumenti to BID PRN only  on 12/10  DVT prophylaxis: apixaban (temporarily on hold for EGD) SCDs ordered, ambulation ordered Code Status: Full  Family Communication: wife updated at bedside  Disposition: anticipate DC home in 1-2 days after GI evaluation completed  Status is: Inpatient  Remains inpatient appropriate because: IV antibiotics and IV fluids, awaiting GI evaluation 12/10    Consultants:  GI   Procedures:  EGD planned for 12/12   Antimicrobials:  Ceftriaxone 12/7>>12/11 Azithromycin 12/7>> 12/11 Doxycycline 12/12>>  Subjective: Pt reports less cough and chest congestion today.  He is more alert today.      Objective: Vitals:   02/19/21 1929 02/19/21 2030 02/20/21 0254 02/20/21 0700  BP:  133/86 (!) 143/108   Pulse:  (!) 105 94   Resp:  20 16   Temp:  98.5 F (36.9 C) 97.8 F (36.6 C)   TempSrc:  Oral    SpO2: 93% 96% 96%   Weight:    89.4 kg  Height:        Intake/Output Summary (Last 24 hours) at 02/20/2021 1052 Last data filed at 02/20/2021 0700 Gross per 24 hour  Intake 2931.04 ml  Output 1300 ml  Net 1631.04 ml   Filed Weights   02/16/21 1141 02/19/21 0446 02/20/21 0700  Weight: 88.5 kg 88.6 kg 89.4 kg    Examination:  General exam: thin emaciated appearing male, awake, loud congested cough, Appears calm and comfortable. No resting tremor today. No somnolence today.  Respiratory system: improving rales RLL, No increased work of breathing. Cardiovascular system: normal S1 & S2 heard. Tachycardic rate, No JVD, murmurs, rubs, gallops or clicks. No pedal edema. Gastrointestinal system: Abdomen is nondistended, soft and nontender. No organomegaly or masses felt. Normal bowel sounds heard. Central nervous system: Alert and oriented. No focal neurological deficits. Extremities: Symmetric 5 x 5 power. Skin: No rashes, lesions or ulcers Psychiatry: Judgement and insight appear poor. Mood & affect appropriate.   Data Reviewed: I have personally reviewed following labs and imaging studies  CBC: Recent Labs  Lab 02/16/21 1208 02/17/21 0517 02/19/21 0728  WBC 9.5 7.7 11.0*  NEUTROABS 7.5 6.1  --   HGB 14.0 11.9* 11.9*  HCT 41.3 35.6* 35.9*  MCV 108.1* 110.2* 111.8*  PLT 275 240 213    Basic Metabolic Panel: Recent Labs  Lab 02/16/21 1200 02/16/21 1208 02/17/21 0517 02/18/21 0829  NA  --  134* 138 139  K  --  2.4* 3.2*  3.7  CL  --  84* 97* 102  CO2  --  38* 33* 29  GLUCOSE  --  109* 87 86  BUN  --  15 15 17   CREATININE  --  1.30* 1.17 1.02  CALCIUM  --  8.7* 8.1* 8.4*  MG 1.8  --  1.6* 2.2    GFR: Estimated Creatinine Clearance: 77 mL/min (by C-G formula based on SCr of 1.02 mg/dL).  Liver Function Tests: Recent Labs  Lab 02/16/21 1208 02/17/21 0517  AST 47* 31  ALT 30 21  ALKPHOS 109 84  BILITOT 0.7 0.7  PROT 7.2 5.7*  ALBUMIN 2.6* 2.1*    CBG: No results for input(s): GLUCAP in the last 168 hours.  Recent Results (from the past 240 hour(s))  Culture, blood (Routine x 2)     Status: None (Preliminary result)   Collection Time: 02/16/21 12:08 PM   Specimen: BLOOD  Result Value Ref Range Status   Specimen Description   Final    BLOOD BLOOD LEFT FOREARM Performed at [facility], [address], [place], [place] [address]    Special Requests   Final    BOTTLES DRAWN AEROBIC AND ANAEROBIC Blood Culture results may not be optimal due to an excessive volume of blood received in culture bottles Performed at [facility], [address], [place], [place] [address]    Culture  Setup Time   Final    CORRECTED RESULTS PREVIOUSLY REPORTED AS: GRAM POSITIVE COCCI Gram Stain Report Called to,Read Back By and Verified With: TIA PARRIS @ 1931 ON 02/16/21 C VARNER CORRECTED RESULTS CALLED TO: PHARM D L.POOLE ON [id number] AT 8127 BY E.PARRISH Performed at [facility], [employer] [address], [place], [place] [address]    Culture   Final    NO GROWTH 4 DAYS Performed at [facility], [address] [place], [place] [address]    Report Status PENDING  Incomplete  Blood Culture ID Panel (Reflexed)     Status: None   Collection Time: 02/16/21 12:08 PM  Result Value Ref Range Status   Enterococcus faecalis NOT DETECTED NOT DETECTED Final   Enterococcus Faecium NOT DETECTED NOT DETECTED Final   Listeria monocytogenes NOT DETECTED NOT DETECTED Final   Staphylococcus species NOT DETECTED NOT DETECTED  Final   Staphylococcus aureus (BCID) NOT DETECTED NOT DETECTED Final   Staphylococcus epidermidis NOT DETECTED NOT DETECTED Final   Staphylococcus lugdunensis NOT DETECTED NOT DETECTED Final   Streptococcus species NOT DETECTED NOT DETECTED Final   Streptococcus agalactiae NOT DETECTED NOT DETECTED Final   Streptococcus pneumoniae NOT DETECTED NOT DETECTED Final   Streptococcus pyogenes NOT DETECTED NOT DETECTED Final   A.calcoaceticus-baumannii NOT DETECTED NOT DETECTED Final   Bacteroides fragilis NOT DETECTED NOT DETECTED Final   Enterobacterales NOT DETECTED NOT DETECTED Final   Enterobacter cloacae complex NOT DETECTED NOT DETECTED Final   Escherichia coli NOT DETECTED NOT DETECTED Final   Klebsiella aerogenes NOT DETECTED NOT DETECTED Final   Klebsiella oxytoca NOT DETECTED NOT DETECTED Final   Klebsiella pneumoniae NOT DETECTED NOT DETECTED Final   Proteus species NOT DETECTED NOT DETECTED Final   Salmonella species NOT DETECTED NOT DETECTED Final   Serratia marcescens NOT DETECTED NOT DETECTED Final   Haemophilus influenzae NOT DETECTED NOT DETECTED Final   Neisseria meningitidis NOT DETECTED NOT DETECTED Final   Pseudomonas aeruginosa NOT DETECTED NOT DETECTED Final   Stenotrophomonas maltophilia NOT DETECTED NOT DETECTED Final   Candida albicans NOT DETECTED NOT DETECTED Final   Candida auris NOT DETECTED NOT DETECTED Final   Candida glabrata NOT DETECTED NOT DETECTED Final   Candida krusei NOT DETECTED NOT DETECTED Final   Candida parapsilosis NOT DETECTED NOT DETECTED Final   Candida tropicalis NOT DETECTED NOT DETECTED Final   Cryptococcus neoformans/gattii NOT DETECTED NOT DETECTED Final    Comment: Performed at [facility], [address] [place], [place] [address]  Culture, blood (Routine x 2)     Status: None (Preliminary result)   Collection Time: 02/16/21 12:09 PM   Specimen: BLOOD  Result Value Ref Range Status   Specimen Description BLOOD BLOOD RIGHT  FOREARM  Final   Special Requests   Final    BOTTLES DRAWN AEROBIC AND ANAEROBIC Blood Culture adequate volume   Culture   Final    NO GROWTH 4 DAYS Performed at [facility], [address], [place], [place] [address]    Report Status PENDING  Incomplete  Resp Panel by RT-PCR (Flu A&B, Covid) Nasopharyngeal Swab     Status: None  Collection Time: 02/16/21 12:36 PM   Specimen: Nasopharyngeal Swab; Nasopharyngeal(NP) swabs in vial transport medium  Result Value Ref Range Status   SARS Coronavirus 2 by RT PCR NEGATIVE NEGATIVE Final    Comment: (NOTE) SARS-CoV-2 target nucleic acids are NOT DETECTED.  The SARS-CoV-2 RNA is generally detectable in upper respiratory specimens during the acute phase of infection. The lowest concentration of SARS-CoV-2 viral copies this assay can detect is 138 copies/mL. A negative result does not preclude SARS-Cov-2 infection and should not be used as the sole basis for treatment or other patient management decisions. A negative result may occur with  improper specimen collection/handling, submission of specimen other than nasopharyngeal swab, presence of viral mutation(s) within the areas targeted by this assay, and inadequate number of viral copies(<138 copies/mL). A negative result must be combined with clinical observations, patient history, and epidemiological information. The expected result is Negative.  Fact Sheet for Patients:  EntrepreneurPulse.com.au  Fact Sheet for Healthcare Providers:  IncredibleEmployment.be  This test is no t yet approved or cleared by the [place] FDA and  has been authorized for detection and/or diagnosis of SARS-CoV-2 by FDA under an Emergency Use Authorization (EUA). This EUA will remain  in effect (meaning this test can be used) for the duration of the COVID-19 declaration under Section 564(b)(1) of the Act, 21 U.S.C.section 360bbb-3(b)(1), unless the authorization is  terminated  or revoked sooner.       Influenza A by PCR NEGATIVE NEGATIVE Final   Influenza B by PCR NEGATIVE NEGATIVE Final    Comment: (NOTE) The Xpert Xpress SARS-CoV-2/FLU/RSV plus assay is intended as an aid in the diagnosis of influenza from Nasopharyngeal swab specimens and should not be used as a sole basis for treatment. Nasal washings and aspirates are unacceptable for Xpert Xpress SARS-CoV-2/FLU/RSV testing.  Fact Sheet for Patients: EntrepreneurPulse.com.au  Fact Sheet for Healthcare Providers: IncredibleEmployment.be  This test is not yet approved or cleared by the [place] FDA and has been authorized for detection and/or diagnosis of SARS-CoV-2 by FDA under an Emergency Use Authorization (EUA). This EUA will remain in effect (meaning this test can be used) for the duration of the COVID-19 declaration under Section 564(b)(1) of the Act, 21 U.S.C. section 360bbb-3(b)(1), unless the authorization is terminated or revoked.  Performed at [facility], [address] [place], [place] [address]     Radiology Studies: DG Chest 2 View  Result Date: 02/20/2021 CLINICAL DATA:  Shortness of breath, cough. EXAM: CHEST - 2 VIEW COMPARISON:  February 16, 2021. FINDINGS: Stable cardiomediastinal silhouette. Interval development of large bilateral patchy airspace opacities are noted, most prominently seen in the right upper lobe and left lower lobe, consistent with multifocal pneumonia. Small left pleural effusion may be present. Bony thorax is unremarkable. IMPRESSION: Interval development of bilateral lung opacities most consistent with multifocal pneumonia. Small left pleural effusion. Electronically Signed   By: Marijo Conception M.D.   On: 02/20/2021 09:43    Scheduled Meds:  amLODipine  5 mg Oral Daily   atorvastatin  10 mg Oral QPM   buPROPion  150 mg Oral Daily   dextromethorphan  30 mg Oral BID   [START ON 02/21/2021] doxycycline   100 mg Oral Q12H   feeding supplement  237 mL Oral BID BM   folic acid  1 mg Oral Daily   guaiFENesin  1,200 mg Oral BID   influenza vaccine adjuvanted  0.5 mL Intramuscular Tomorrow-1000   ipratropium-albuterol  3 mL Nebulization BID  metoprolol succinate  50 mg Oral Daily   multivitamin with minerals  1 tablet Oral Daily   nicotine  21 mg Transdermal Daily   pantoprazole  40 mg Oral BID AC   thiamine  100 mg Oral Daily   Or   thiamine  100 mg Intravenous Daily   Continuous Infusions:  0.9 % NaCl with KCl 20 mEq / L 35 mL/hr at 02/19/21 1509   azithromycin 500 mg (02/19/21 1548)   cefTRIAXone (ROCEPHIN)  IV 2 g (02/19/21 1458)    LOS: 4 days   Time spent: 24 mins   Ader Fritze Wynetta Emery, MD How to contact the [facility] Attending or Consulting provider [place] or covering provider during after hours [place], for this patient?  Check the care team in [facility] and look for a) attending/consulting [facility] provider listed and b) the [facility] team listed Log into [email or web address] and use [place]'s universal password to access. If you do not have the password, please contact the hospital operator. Locate the [facility] provider you are looking for under [facility] and page to a number that you can be directly reached. If you still have difficulty reaching the provider, please page the [facility] (Director on Call) for the Hospitalists listed on amion for assistance.  02/20/2021, 10:52 AM

## 2021-02-21 ENCOUNTER — Inpatient Hospital Stay (HOSPITAL_COMMUNITY): Payer: Medicare Other | Admitting: Registered Nurse

## 2021-02-21 ENCOUNTER — Encounter (HOSPITAL_COMMUNITY): Payer: Self-pay | Admitting: Family Medicine

## 2021-02-21 ENCOUNTER — Other Ambulatory Visit: Payer: Self-pay

## 2021-02-21 ENCOUNTER — Encounter (HOSPITAL_COMMUNITY)
Admission: EM | Disposition: A | Discharge status: Home Health | Payer: Self-pay | Source: Home / Self Care | Attending: Family Medicine

## 2021-02-21 DIAGNOSIS — K2970 Gastritis, unspecified, without bleeding: Secondary | ICD-10-CM

## 2021-02-21 DIAGNOSIS — B3781 Candidal esophagitis: Secondary | ICD-10-CM

## 2021-02-21 HISTORY — PX: ESOPHAGOGASTRODUODENOSCOPY (EGD) WITH PROPOFOL: SHX5813

## 2021-02-21 HISTORY — PX: BIOPSY: SHX5522

## 2021-02-21 LAB — KOH PREP: KOH Prep: NONE SEEN

## 2021-02-21 LAB — URINE CULTURE: Culture: NO GROWTH

## 2021-02-21 LAB — CULTURE, BLOOD (ROUTINE X 2)
Culture: NO GROWTH
Culture: NO GROWTH
Special Requests: ADEQUATE

## 2021-02-21 SURGERY — ESOPHAGOGASTRODUODENOSCOPY (EGD) WITH PROPOFOL
Anesthesia: General

## 2021-02-21 MED ORDER — SODIUM CHLORIDE 0.9 % IV SOLN: INTRAVENOUS | Status: DC

## 2021-02-21 MED ORDER — METOPROLOL TARTRATE 5 MG/5ML IV SOLN
2.5000 mg | Freq: Once | INTRAVENOUS | Status: AC
  Administered 2021-02-21: 2.5 mg via INTRAVENOUS

## 2021-02-21 MED ORDER — PROPOFOL 10 MG/ML IV BOLUS
INTRAVENOUS | Status: DC | PRN
  Administered 2021-02-21: 70 mg via INTRAVENOUS

## 2021-02-21 MED ORDER — METOPROLOL TARTRATE 5 MG/5ML IV SOLN
INTRAVENOUS | Status: AC
  Filled 2021-02-21: qty 5

## 2021-02-21 MED ORDER — LACTATED RINGERS IV SOLN: INTRAVENOUS | Status: DC

## 2021-02-21 NOTE — Anesthesia Preprocedure Evaluation (Signed)
Anesthesia Evaluation  Patient identified by MRN, date of birth, ID band Patient awake    Reviewed: Allergy & Precautions, NPO status , Patient's Chart, lab work & pertinent test results  History of Anesthesia Complications Negative for: history of anesthetic complications  Airway Mallampati: III  TM Distance: >3 FB Neck ROM: Full  Mouth opening: Limited Mouth Opening  Dental  (+) Dental Advisory Given, Missing, Chipped, Poor Dentition   Pulmonary pneumonia, unresolved, Current Smoker and Patient abstained from smoking.,    Pulmonary exam normal breath sounds clear to auscultation       Cardiovascular Exercise Tolerance: Good hypertension, Pt. on medications + CAD and + Peripheral Vascular Disease (TAA. AAA)  + dysrhythmias Atrial Fibrillation  Rhythm:Irregular Rate:Tachycardia     Neuro/Psych PSYCHIATRIC DISORDERS    GI/Hepatic negative GI ROS, (+)     substance abuse  alcohol use,   Endo/Other  negative endocrine ROS  Renal/GU Renal InsufficiencyRenal disease     Musculoskeletal negative musculoskeletal ROS (+)   Abdominal   Peds  Hematology negative hematology ROS (+)   Anesthesia Other Findings 1. Mild thoracic aortic aneurysm measuring 3.4 cm in the arch, 3.5 cm in the proximal descending segment. Recommend annual imaging followup by CTA or MRA. This recommendation follows 2010 ACCF/AHA/AATS/ACR/ASA/SCA/SCAI/SIR/STS/SVM Guidelines for the Diagnosis and Management of Patients with Thoracic Aortic Disease.  Reproductive/Obstetrics negative OB ROS                            Anesthesia Physical  Anesthesia Plan  ASA: 4  Anesthesia Plan: General   Post-op Pain Management: Minimal or no pain anticipated   Induction: Intravenous  PONV Risk Score and Plan: TIVA  Airway Management Planned: Nasal Cannula and Natural Airway  Additional Equipment:   Intra-op Plan:    Post-operative Plan:   Informed Consent: I have reviewed the patients History and Physical, chart, labs and discussed the procedure including the risks, benefits and alternatives for the proposed anesthesia with the patient or authorized representative who has indicated his/her understanding and acceptance.     Dental advisory given  Plan Discussed with: CRNA and Surgeon  Anesthesia Plan Comments:         Anesthesia Quick Evaluation

## 2021-02-21 NOTE — Op Note (Signed)
[facility] Patient Name: Anthony Little Procedure Date: 02/21/2021 10:22 AM MRN: [id number] Date of Birth: 1950/08/04 Attending MD: Elon Alas. Abbey Chatters DO CSN: [id number] Age: 70 Admit Type: Outpatient Procedure:                Upper GI endoscopy Indications:              Odynophagia, Nausea with vomiting, Weight loss Providers:                Elon Alas. Abbey Chatters, DO, Lambert Mody, Dereck Leep, Technician Referring MD:              Medicines:                See the Anesthesia note for documentation of the                            administered medications Complications:            No immediate complications. Estimated Blood Loss:     Estimated blood loss was minimal. Procedure:                Pre-Anesthesia Assessment:                           - The anesthesia plan was to use monitored                            anesthesia care (MAC).                           After obtaining informed consent, the endoscope was                            passed under direct vision. Throughout the                            procedure, the patient's blood pressure, pulse, and                            oxygen saturations were monitored continuously. The                            [id number]-[id number] ([id number]) scope was introduced through the                            mouth, and advanced to the second part of duodenum.                            The upper GI endoscopy was accomplished without                            difficulty. The patient tolerated the procedure                            well.  Scope In: 10:38:54 AM Scope Out: 10:42:50 AM Total Procedure Duration: 0 hours 3 minutes 56 seconds  Findings:      Non-severe esophagitis with no bleeding was found in the middle third of       the esophagus. Cells for cytology were obtained by brushing.      Moderate inflammation characterized by congestion (edema), erosions and       erythema was found in the  gastric body and in the gastric antrum.       Biopsies were taken with a cold forceps for Helicobacter pylori testing.      The duodenal bulb, first portion of the duodenum and second portion of       the duodenum were normal. Impression:               - Non-severe candidiasis esophagitis with no                            bleeding. Cells for cytology obtained.                           - Gastritis. Biopsied.                           - Normal duodenal bulb, first portion of the                            duodenum and second portion of the duodenum. Moderate Sedation:      Per Anesthesia Care Recommendation:           - Return patient to hospital ward for ongoing care.                           - Cardiac diet.                           - Continue on BID PPI                           - Will treat for candidal esophagitis if cytology                            positive.                           - Will follow up on biopsy results to rule out H                            pylori Procedure Code(s):        --- Professional ---                           [phone], Esophagogastroduodenoscopy, flexible,                            transoral; with biopsy, single or multiple Diagnosis Code(s):        --- Professional ---  B37.81, Candidal esophagitis                           K29.70, Gastritis, unspecified, without bleeding                           R13.10, Dysphagia, unspecified                           R11.2, Nausea with vomiting, unspecified                           R63.4, Abnormal weight loss CPT copyright 2019 American Medical Association. All rights reserved. The codes documented in this report are preliminary and upon coder review may  be revised to meet current compliance requirements. Elon Alas. Abbey Chatters, DO [place] Abbey Chatters, DO 02/21/2021 10:46:05 AM This report has been signed electronically. Number of Addenda: 0

## 2021-02-21 NOTE — Progress Notes (Signed)
PROGRESS NOTE   Anthony Little WEEKLY  TGP:[id number] DOB: November 26, 1950 DOA: 02/16/2021 PCP: Kathyrn Drown, MD   Chief Complaint  Patient presents with   Shortness of Breath   Level of care: Med-Surg  Brief Admission History:  70 y.o. male with medical history significant for chronic daily smoker, COPD, atrial flutter/atrial fibrillation on apixaban, AAA, stage 3a CKD, hyperlipidemia, prior CVA, recently recovered from influenza A infection from 3 weeks ago presents with progressive shortness of breath, cough, malaise, weakness and he has taken 2 negative covid tests at home.  He presents because symptoms continue to worsen.  He has had fever and chills at home.  He has had nausea but no emesis and no diarrhea.  He denies chest pain.  He has palpitations.  He has no vision changes.  He was hypoxic on arrival down to 88% on room air and placed on 2L/min nasal cannula with improvement in oxygen saturation to 95%.  He reportedly does not use oxygen at home.  His labs were notable for an elevated lactic acid of 2.2.  CXR was notable for infiltrates in LLL and RUL.  He was started on sepsis protocol and IV antibiotics and admission was requested  Assessment & Plan:   Principal Problem:   Community acquired pneumonia Active Problems:   Sepsis ([place])   Loss of weight   Brain aneurysm   Alcohol abuse   Essential hypertension   Hyperlipidemia   Thoracic aorta atherosclerosis ([place])   Coronary artery disease involving native heart without angina pectoris   Atrial fibrillation and flutter (HCC)   CKD (chronic kidney disease) stage 3, GFR 30-59 ml/min (HCC)   Abdominal aortic aneurysm (AAA)   Hypokalemia   Lactic acidosis   Odynophagia   Nausea and vomiting  Sepsis secondary to pneumonia  - sepsis physiology resolved now - lactic acidosis treated and resolved - continue supportive care and IV antibiotics - follow blood cultures and sputum culture:    Community Acquired Pneumonia - likely a  postviral pneumonia given recent influenza infection  - continue IV antibiotics and supportive measures- follow blood cultures: 1/2 with anaerobic bottle G pos cocci possible contaminant, "micro called back and said nothing growing on plates" 12/10 per Isac Sarna pharm D.  - complete IV abx 12/11 and started oral doxycycline 12/12    Hypokalemia - Repleted  - from HCTZ  - Hold HCTZ for now - oral potassium replacement ordered - Mg has been repleted.   Hypomagnesemia - IV replacement ordered and repleted    Chronic Atrial fibrillation/Atrial Flutter  - heart rates have been labile in setting of acute alcohol withdrawal - increased metoprolol XL to 50 mg daily starting 12/10 - resumed apixaban for full anticoagulation : HOLDING apixaban temporarily for EGD on 12/12 - intermittent tachycardia thought secondary to albuterol nebulizer treatments which are being weaned.    Hyperlipidemia - resumed home atorvastatin   Essential hypertension -suboptimally controlled -increased metoprolol XL to 50 mg on 12/10   Tobacco use - nicotine patch ordered while in hospital - counseled on tobacco cessation   Alcohol abuse with acute alcohol withdrawal delirium tremens - wife reported that he drinks daily alcohol and heavily every night - she reported recently stopping his alcohol consumption at home leading to hallucinations - CIWA protocol started and spiritus frumenti ordered TID - repleting vitamins, thiamine, folic acid  - continue librium tablets TID but reduce dose by 50% to 5 mg PRN only as he is improving on 12/10 -  as he is improving, reduced dose of spiritus frumenti to BID PRN only on 12/10  DVT prophylaxis: apixaban (temporarily on hold for EGD) SCDs ordered, ambulation ordered Code Status: Full  Family Communication: wife updated at bedside  Disposition: anticipate DC home in 1-2 days after GI evaluation completed  Status is: Inpatient  Remains inpatient appropriate because:  IV antibiotics and IV fluids, awaiting GI evaluation 12/10   Consultants:  GI   Procedures:  EGD planned for 12/12   Antimicrobials:  Ceftriaxone 12/7>>12/11 Azithromycin 12/7>> 12/11 Doxycycline 12/12>>  Subjective: Pt reports poor appetite today. No CP.  Cough getting better.       Objective: Vitals:   02/21/21 1054 02/21/21 1055 02/21/21 1100 02/21/21 1133  BP: (!) 128/91 (!) 128/91 136/86 (!) 135/93  Pulse: (!) 103 100 95 (!) 107  Resp: 18 (!) 30 (!) 27 20  Temp: 99.3 F (37.4 C) 98.4 F (36.9 C)  97.9 F (36.6 C)  TempSrc: Oral   Oral  SpO2: 97% 96% 94% 95%  Weight:      Height:        Intake/Output Summary (Last 24 hours) at 02/21/2021 1400 Last data filed at 02/21/2021 1042 Gross per 24 hour  Intake 420 ml  Output 1950 ml  Net -1530 ml   Filed Weights   02/16/21 1141 02/19/21 0446 02/20/21 0700  Weight: 88.5 kg 88.6 kg 89.4 kg    Examination:  General exam: thin emaciated appearing male, awake, loud congested cough, Appears calm and comfortable. No resting tremor today. He has been intermittently confused.   Respiratory system: improving rales RLL, No increased work of breathing. Cardiovascular system: normal S1 & S2 heard. Tachycardic rate, No JVD, murmurs, rubs, gallops or clicks. No pedal edema. Gastrointestinal system: Abdomen is nondistended, soft and nontender. No organomegaly or masses felt. Normal bowel sounds heard. Central nervous system: Alert and oriented. No focal neurological deficits. Extremities: Symmetric 5 x 5 power. Skin: No rashes, lesions or ulcers Psychiatry: Judgement and insight appear poor. Mood & affect appropriate.   Data Reviewed: I have personally reviewed following labs and imaging studies  CBC: Recent Labs  Lab 02/16/21 1208 02/17/21 0517 02/19/21 0728  WBC 9.5 7.7 11.0*  NEUTROABS 7.5 6.1  --   HGB 14.0 11.9* 11.9*  HCT 41.3 35.6* 35.9*  MCV 108.1* 110.2* 111.8*  PLT 275 240 546    Basic Metabolic  Panel: Recent Labs  Lab 02/16/21 1200 02/16/21 1208 02/17/21 0517 02/18/21 0829  NA  --  134* 138 139  K  --  2.4* 3.2* 3.7  CL  --  84* 97* 102  CO2  --  38* 33* 29  GLUCOSE  --  109* 87 86  BUN  --  15 15 17   CREATININE  --  1.30* 1.17 1.02  CALCIUM  --  8.7* 8.1* 8.4*  MG 1.8  --  1.6* 2.2    GFR: Estimated Creatinine Clearance: 77 mL/min (by C-G formula based on SCr of 1.02 mg/dL).  Liver Function Tests: Recent Labs  Lab 02/16/21 1208 02/17/21 0517  AST 47* 31  ALT 30 21  ALKPHOS 109 84  BILITOT 0.7 0.7  PROT 7.2 5.7*  ALBUMIN 2.6* 2.1*    CBG: No results for input(s): GLUCAP in the last 168 hours.  Recent Results (from the past 240 hour(s))  Culture, blood (Routine x 2)     Status: None   Collection Time: 02/16/21 12:08 PM   Specimen: BLOOD  Result Value Ref  Range Status   Specimen Description   Final    BLOOD BLOOD LEFT FOREARM Performed at [facility], [address] [place], [place] [address]    Special Requests   Final    BOTTLES DRAWN AEROBIC AND ANAEROBIC Blood Culture results may not be optimal due to an excessive volume of blood received in culture bottles Performed at [facility], [address], [place], [place] [address]    Culture  Setup Time   Final    CORRECTED RESULTS PREVIOUSLY REPORTED AS: GRAM POSITIVE COCCI Gram Stain Report Called to,Read Back By and Verified With: TIA PARRIS @ 1931 ON 02/16/21 C VARNER CORRECTED RESULTS CALLED TO: PHARM D L.POOLE ON [id number] AT 3557 BY E.PARRISH    Culture   Final    NO GROWTH 5 DAYS Performed at [facility], [place] [address] [place], [place] [address]    Report Status 02/21/2021 FINAL  Final  Blood Culture ID Panel (Reflexed)     Status: None   Collection Time: 02/16/21 12:08 PM  Result Value Ref Range Status   Enterococcus faecalis NOT DETECTED NOT DETECTED Final   Enterococcus Faecium NOT DETECTED NOT DETECTED Final   Listeria monocytogenes NOT DETECTED NOT DETECTED Final    Staphylococcus species NOT DETECTED NOT DETECTED Final   Staphylococcus aureus (BCID) NOT DETECTED NOT DETECTED Final   Staphylococcus epidermidis NOT DETECTED NOT DETECTED Final   Staphylococcus lugdunensis NOT DETECTED NOT DETECTED Final   Streptococcus species NOT DETECTED NOT DETECTED Final   Streptococcus agalactiae NOT DETECTED NOT DETECTED Final   Streptococcus pneumoniae NOT DETECTED NOT DETECTED Final   Streptococcus pyogenes NOT DETECTED NOT DETECTED Final   A.calcoaceticus-baumannii NOT DETECTED NOT DETECTED Final   Bacteroides fragilis NOT DETECTED NOT DETECTED Final   Enterobacterales NOT DETECTED NOT DETECTED Final   Enterobacter cloacae complex NOT DETECTED NOT DETECTED Final   Escherichia coli NOT DETECTED NOT DETECTED Final   Klebsiella aerogenes NOT DETECTED NOT DETECTED Final   Klebsiella oxytoca NOT DETECTED NOT DETECTED Final   Klebsiella pneumoniae NOT DETECTED NOT DETECTED Final   Proteus species NOT DETECTED NOT DETECTED Final   Salmonella species NOT DETECTED NOT DETECTED Final   Serratia marcescens NOT DETECTED NOT DETECTED Final   Haemophilus influenzae NOT DETECTED NOT DETECTED Final   Neisseria meningitidis NOT DETECTED NOT DETECTED Final   Pseudomonas aeruginosa NOT DETECTED NOT DETECTED Final   Stenotrophomonas maltophilia NOT DETECTED NOT DETECTED Final   Candida albicans NOT DETECTED NOT DETECTED Final   Candida auris NOT DETECTED NOT DETECTED Final   Candida glabrata NOT DETECTED NOT DETECTED Final   Candida krusei NOT DETECTED NOT DETECTED Final   Candida parapsilosis NOT DETECTED NOT DETECTED Final   Candida tropicalis NOT DETECTED NOT DETECTED Final   Cryptococcus neoformans/gattii NOT DETECTED NOT DETECTED Final    Comment: Performed at [facility], [address], [place], [place] [address]  Culture, blood (Routine x 2)     Status: None   Collection Time: 02/16/21 12:09 PM   Specimen: BLOOD  Result Value Ref Range Status   Specimen  Description BLOOD BLOOD RIGHT FOREARM  Final   Special Requests   Final    BOTTLES DRAWN AEROBIC AND ANAEROBIC Blood Culture adequate volume   Culture   Final    NO GROWTH 5 DAYS Performed at [facility], [address], [place], [place] [address]    Report Status 02/21/2021 FINAL  Final  Resp Panel by RT-PCR (Flu A&B, Covid)  Nasopharyngeal Swab     Status: None   Collection Time: 02/16/21 12:36 PM   Specimen: Nasopharyngeal Swab; Nasopharyngeal(NP) swabs in vial transport medium  Result Value Ref Range Status   SARS Coronavirus 2 by RT PCR NEGATIVE NEGATIVE Final    Comment: (NOTE) SARS-CoV-2 target nucleic acids are NOT DETECTED.  The SARS-CoV-2 RNA is generally detectable in upper respiratory specimens during the acute phase of infection. The lowest concentration of SARS-CoV-2 viral copies this assay can detect is 138 copies/mL. A negative result does not preclude SARS-Cov-2 infection and should not be used as the sole basis for treatment or other patient management decisions. A negative result may occur with  improper specimen collection/handling, submission of specimen other than nasopharyngeal swab, presence of viral mutation(s) within the areas targeted by this assay, and inadequate number of viral copies(<138 copies/mL). A negative result must be combined with clinical observations, patient history, and epidemiological information. The expected result is Negative.  Fact Sheet for Patients:  EntrepreneurPulse.com.au  Fact Sheet for Healthcare Providers:  IncredibleEmployment.be  This test is no t yet approved or cleared by the [place] FDA and  has been authorized for detection and/or diagnosis of SARS-CoV-2 by FDA under an Emergency Use Authorization (EUA). This EUA will remain  in effect (meaning this test can be used) for the duration of the COVID-19 declaration under Section 564(b)(1) of the Act, 21 U.S.C.section  360bbb-3(b)(1), unless the authorization is terminated  or revoked sooner.       Influenza A by PCR NEGATIVE NEGATIVE Final   Influenza B by PCR NEGATIVE NEGATIVE Final    Comment: (NOTE) The Xpert Xpress SARS-CoV-2/FLU/RSV plus assay is intended as an aid in the diagnosis of influenza from Nasopharyngeal swab specimens and should not be used as a sole basis for treatment. Nasal washings and aspirates are unacceptable for Xpert Xpress SARS-CoV-2/FLU/RSV testing.  Fact Sheet for Patients: EntrepreneurPulse.com.au  Fact Sheet for Healthcare Providers: IncredibleEmployment.be  This test is not yet approved or cleared by the [place] FDA and has been authorized for detection and/or diagnosis of SARS-CoV-2 by FDA under an Emergency Use Authorization (EUA). This EUA will remain in effect (meaning this test can be used) for the duration of the COVID-19 declaration under Section 564(b)(1) of the Act, 21 U.S.C. section 360bbb-3(b)(1), unless the authorization is terminated or revoked.  Performed at [facility], [address] [place], [place] [address]   Tyro prep     Status: None   Collection Time: 02/21/21 10:45 AM   Specimen: PATH GI Other  Result Value Ref Range Status   Specimen Description ESOPHAGUS  Final   Special Requests NONE  Final   KOH Prep   Final    NO YEAST OR FUNGAL ELEMENTS SEEN Performed at [facility], [address] [place], [place] [address]    Report Status 02/21/2021 FINAL  Final    Radiology Studies: DG Chest 2 View  Result Date: 02/20/2021 CLINICAL DATA:  Shortness of breath, cough. EXAM: CHEST - 2 VIEW COMPARISON:  February 16, 2021. FINDINGS: Stable cardiomediastinal silhouette. Interval development of large bilateral patchy airspace opacities are noted, most prominently seen in the right upper lobe and left lower lobe, consistent with multifocal pneumonia. Small left pleural effusion may be present. Bony thorax  is unremarkable. IMPRESSION: Interval development of bilateral lung opacities most consistent with multifocal pneumonia. Small left pleural effusion. Electronically Signed   By: Marijo Conception M.D.   On: 02/20/2021 09:43    Scheduled  Meds:  amLODipine  5 mg Oral Daily   atorvastatin  10 mg Oral QPM   buPROPion  150 mg Oral Daily   doxycycline  100 mg Oral Q12H   feeding supplement  237 mL Oral BID BM   folic acid  1 mg Oral Daily   guaiFENesin  1,200 mg Oral BID   influenza vaccine adjuvanted  0.5 mL Intramuscular Tomorrow-1000   ipratropium-albuterol  3 mL Nebulization BID   metoprolol succinate  50 mg Oral Daily   metoprolol tartrate       multivitamin with minerals  1 tablet Oral Daily   nicotine  21 mg Transdermal Daily   pantoprazole  40 mg Oral BID AC   thiamine  100 mg Oral Daily   Or   thiamine  100 mg Intravenous Daily   Continuous Infusions:  0.9 % NaCl with KCl 20 mEq / L 10 mL/hr at 02/21/21 1030    LOS: 5 days   Time spent: 35 mins   Berania Peedin Wynetta Emery, MD How to contact the [facility] Attending or Consulting provider [place] or covering provider during after hours [place], for this patient?  Check the care team in [facility] and look for a) attending/consulting TRH provider listed and b) the [facility] team listed Log into [email or web address] and use [place]'s universal password to access. If you do not have the password, please contact the hospital operator. Locate the [facility] provider you are looking for under [facility] and page to a number that you can be directly reached. If you still have difficulty reaching the provider, please page the [facility] (Director on Call) for the Hospitalists listed on amion for assistance.  02/21/2021, 2:00 PM

## 2021-02-21 NOTE — Interval H&P Note (Signed)
History and Physical Interval Note:  02/21/2021 10:21 AM  Anthony Little  has presented today for surgery, with the diagnosis of Odynophagia, Nausea/vomiting, weight loss.  The various methods of treatment have been discussed with the patient and family. After consideration of risks, benefits and other options for treatment, the patient has consented to  Procedure(s): ESOPHAGOGASTRODUODENOSCOPY (EGD) WITH PROPOFOL (N/A) as a surgical intervention.  The patient's history has been reviewed, patient examined, no change in status, stable for surgery.  I have reviewed the patient's chart and labs.  Questions were answered to the patient's satisfaction.     Eloise Harman

## 2021-02-21 NOTE — Anesthesia Postprocedure Evaluation (Signed)
Anesthesia Post Note  Patient: Anthony Little  Procedure(s) Performed: ESOPHAGOGASTRODUODENOSCOPY (EGD) WITH PROPOFOL BIOPSY  Patient location during evaluation: PACU Anesthesia Type: General Level of consciousness: awake and alert and oriented Pain management: pain level controlled Vital Signs Assessment: post-procedure vital signs reviewed and stable Respiratory status: spontaneous breathing, nonlabored ventilation, respiratory function stable and patient connected to nasal cannula oxygen Cardiovascular status: blood pressure returned to baseline and stable Postop Assessment: no apparent nausea or vomiting Anesthetic complications: no   No notable events documented.   Last Vitals:  Vitals:   02/21/21 1100 02/21/21 1133  BP: 136/86 (!) 135/93  Pulse: 95 (!) 107  Resp: (!) 27 20  Temp:  36.6 C  SpO2: 94% 95%    Last Pain:  Vitals:   02/21/21 1133  TempSrc: Oral  PainSc:                  [name] [name]

## 2021-02-21 NOTE — Transfer of Care (Signed)
Immediate Anesthesia Transfer of Care Note  Patient: Nekoda C [place]  Procedure(s) Performed: ESOPHAGOGASTRODUODENOSCOPY (EGD) WITH PROPOFOL BIOPSY  Patient Location: PACU  Anesthesia Type:General  Level of Consciousness: awake, alert  and oriented  Airway & Oxygen Therapy: Patient Spontanous Breathing and Patient connected to nasal cannula oxygen  Post-op Assessment: Report given to RN and Post -op Vital signs reviewed and stable  Post vital signs: Reviewed and stable  Last Vitals:  Vitals Value Taken Time  BP 128/91 02/21/21 1054  Temp 37.4 C 02/21/21 1054  Pulse 100 02/21/21 1055  Resp 26 02/21/21 1055  SpO2 96 % 02/21/21 1055  Vitals shown include unvalidated device data.  Last Pain:  Vitals:   02/21/21 1054  TempSrc: Oral  PainSc:       Patients Stated Pain Goal: 0 (12/50/87 1994)  Complications: No notable events documented.

## 2021-02-22 ENCOUNTER — Telehealth: Payer: Self-pay | Admitting: Gastroenterology

## 2021-02-22 DIAGNOSIS — K2970 Gastritis, unspecified, without bleeding: Secondary | ICD-10-CM

## 2021-02-22 DIAGNOSIS — K209 Esophagitis, unspecified without bleeding: Secondary | ICD-10-CM

## 2021-02-22 LAB — SURGICAL PATHOLOGY

## 2021-02-22 MED ORDER — DOXYCYCLINE HYCLATE 100 MG PO TABS: 100.0000 mg | ORAL_TABLET | Freq: Two times a day (BID) | ORAL | 0 refills | Status: AC

## 2021-02-22 MED ORDER — MOMETASONE FUROATE 0.1 % EX CREA: 1.0000 "application " | TOPICAL_CREAM | Freq: Two times a day (BID) | CUTANEOUS | Status: DC | PRN

## 2021-02-22 MED ORDER — PANTOPRAZOLE SODIUM 40 MG PO TBEC: 40.0000 mg | DELAYED_RELEASE_TABLET | Freq: Two times a day (BID) | ORAL | 1 refills | Status: DC

## 2021-02-22 MED ORDER — ENSURE ENLIVE PO LIQD: 237.0000 mL | Freq: Two times a day (BID) | ORAL | 12 refills | Status: DC

## 2021-02-22 MED ORDER — THIAMINE HCL 100 MG PO TABS: 100.0000 mg | ORAL_TABLET | Freq: Every day | ORAL | 3 refills | Status: DC

## 2021-02-22 MED ORDER — ADULT MULTIVITAMIN W/MINERALS CH: 1.0000 | ORAL_TABLET | Freq: Every day | ORAL | 1 refills | Status: DC

## 2021-02-22 MED ORDER — GUAIFENESIN ER 600 MG PO TB12: 1200.0000 mg | ORAL_TABLET | Freq: Two times a day (BID) | ORAL | 0 refills | Status: AC

## 2021-02-22 MED ORDER — METOPROLOL SUCCINATE ER 50 MG PO TB24: 50.0000 mg | ORAL_TABLET | Freq: Every day | ORAL | 1 refills | Status: DC

## 2021-02-22 MED ORDER — FOLIC ACID 1 MG PO TABS: 1.0000 mg | ORAL_TABLET | Freq: Every day | ORAL | 3 refills | Status: DC

## 2021-02-22 MED ORDER — DEXTROMETHORPHAN POLISTIREX ER 30 MG/5ML PO SUER
30.0000 mg | Freq: Two times a day (BID) | ORAL | 0 refills | Status: DC | PRN
Start: 2021-02-22 — End: 2021-03-31

## 2021-02-22 MED ORDER — TRIAMCINOLONE ACETONIDE 0.1 % EX CREA: 1.0000 "application " | TOPICAL_CREAM | Freq: Two times a day (BID) | CUTANEOUS | Status: DC | PRN

## 2021-02-22 NOTE — TOC Transition Note (Signed)
Transition of Care [facility]) - CM/SW Discharge Note   Patient Details  Name: Anthony Little MRN: [id number] Date of Birth: [birth year]  [facility] CM/SW Contact:  Ihor Gully, LCSW Phone Number: 02/22/2021, 11:34 AM   Clinical Narrative:    PT changed recommendation to SNF. Discussed recommendation with patient and spouse. Patient states that he did not realize that SNF meant staying over night. He declined SNF and opts for HHPT.  Patient in need of oxygen. Discussed HH and DME agencies.  Referrals made to [facility] and [facility].  Final next level of care: Home w Home Health Services Barriers to Discharge: No Barriers Identified   Patient Goals and CMS Choice Patient states their goals for this hospitalization and ongoing recovery are:: home   Choice offered to / list presented to : Patient  Discharge Placement                       Discharge Plan and Services                DME Arranged: Oxygen DME Agency: [facility] Date DME Agency Contacted: 02/22/21 Time DME Agency Contacted: [phone] Representative spoke with at DME Agency: [place]: PT [place]: [place] ([place]) Date [place]: 02/22/21 Time [place]: 1134 Representative spoke with at [place]: [place] ([place]) Interventions     Readmission Risk Interventions No flowsheet data found.

## 2021-02-22 NOTE — Progress Notes (Signed)
SATURATION QUALIFICATIONS: (This note is used to comply with regulatory documentation for home oxygen)  Patient Saturations on Room Air at Rest = 87%  Patient Saturations on Room Air while Ambulating   Patient Saturations on 2 Liters of oxygen while Ambulating = 92%  Please briefly explain why patient needs home oxygen:  MD made aware. Needs o2 set up for discharge.

## 2021-02-22 NOTE — Discharge Instructions (Signed)
IMPORTANT INFORMATION: PAY CLOSE ATTENTION   PHYSICIAN DISCHARGE INSTRUCTIONS  Follow with Primary care provider  Luking, Scott A, MD  and other consultants as instructed by your Hospitalist Physician  SEEK MEDICAL CARE OR RETURN TO EMERGENCY ROOM IF SYMPTOMS COME BACK, WORSEN OR NEW PROBLEM DEVELOPS   Please note: You were cared for by a hospitalist during your hospital stay. Every effort will be made to forward records to your primary care provider.  You can request that your primary care provider send for your hospital records if they have not received them.  Once you are discharged, your primary care physician will handle any further medical issues. Please note that NO REFILLS for any discharge medications will be authorized once you are discharged, as it is imperative that you return to your primary care physician (or establish a relationship with a primary care physician if you do not have one) for your post hospital discharge needs so that they can reassess your need for medications and monitor your lab values.  Please get a complete blood count and chemistry panel checked by your Primary MD at your next visit, and again as instructed by your Primary MD.  Get Medicines reviewed and adjusted: Please take all your medications with you for your next visit with your Primary MD  Laboratory/radiological data: Please request your Primary MD to go over all hospital tests and procedure/radiological results at the follow up, please ask your primary care provider to get all Hospital records sent to his/her office.  In some cases, they will be blood work, cultures and biopsy results pending at the time of your discharge. Please request that your primary care provider follow up on these results.  If you are diabetic, please bring your blood sugar readings with you to your follow up appointment with primary care.    Please call and make your follow up appointments as soon as possible.    Also Note  the following: If you experience worsening of your admission symptoms, develop shortness of breath, life threatening emergency, suicidal or homicidal thoughts you must seek medical attention immediately by calling 911 or calling your MD immediately  if symptoms less severe.  You must read complete instructions/literature along with all the possible adverse reactions/side effects for all the Medicines you take and that have been prescribed to you. Take any new Medicines after you have completely understood and accpet all the possible adverse reactions/side effects.   Do not drive when taking Pain medications or sleeping medications (Benzodiazepines)  Do not take more than prescribed Pain, Sleep and Anxiety Medications. It is not advisable to combine anxiety,sleep and pain medications without talking with your primary care practitioner  Special Instructions: If you have smoked or chewed Tobacco  in the last 2 yrs please stop smoking, stop any regular Alcohol  and or any Recreational drug use.  Wear Seat belts while driving.  Do not drive if taking any narcotic, mind altering or controlled substances or recreational drugs or alcohol.       

## 2021-02-22 NOTE — Progress Notes (Signed)
Physical Therapy Treatment Patient Details Name: Anthony Little MRN: [id number] DOB: 05-Dec-1950 Today's Date: 02/22/2021   History of Present Illness Anthony Little is a 70 y.o. male with medical history significant for chronic daily smoker, COPD, atrial flutter/atrial fibrillation on apixaban, AAA, stage 3a CKD, hyperlipidemia, prior CVA, recently recovered from influenza A infection from 3 weeks ago presents with progressive shortness of breath, cough, malaise, weakness and he has taken 2 negative covid tests at home.  He presents because symptoms continue to worsen.  He has had fever and chills at home.  He has had nausea but no emesis and no diarrhea.  He denies chest pain.  He has palpitations.  He has no vision changes.  He was hypoxic on arrival down to 88% on room air and placed on 2L/min nasal cannula with improvement in oxygen saturation to 95%.  He reportedly does not use oxygen at home.  His labs were notable for an elevated lactic acid of 2.2.  He was noted to have a potassium of 2.4 and glucose of 109.  His Na was 134.  His WBC was 9.5.  Creatinine 1.30.  Sars 2 coronavirus was negative and influenza A and B was negative.  CXR was notable for infiltrates in LLL and RUL.  He was started on sepsis protocol and IV antibiotics and admission was requested.    PT Comments    Patient seated in chair at beginning of session. He requires min assist to transfer to standing with RW and to ambulate to commode. He demonstrates good sitting tolerance and sitting balance on the commode. Patient then assisted from commode to ambulate back to chair at bedside. Patient on O2 for session but fatigues very quickly due to impaired activity tolerance and endurance requiring long seated rest break. Patient then completes seated exercises but quickly becomes fatigued. Updating d/c rec to SNF rehab as patient showing impaired strength, activity tolerance, and functional mobility. Patient will benefit from  continued skilled physical therapy in hospital and recommended venue below to increase strength, balance, endurance for safe ADLs and gait.    Recommendations for follow up therapy are one component of a multi-disciplinary discharge planning process, led by the attending physician.  Recommendations may be updated based on patient status, additional functional criteria and insurance authorization.  Follow Up Recommendations  Skilled nursing-short term rehab (<3 hours/day)     Assistance Recommended at Discharge Frequent or constant Supervision/Assistance  Equipment Recommendations  Rolling walker (2 wheels)    Recommendations for Other Services       Precautions / Restrictions Precautions Precautions: Fall Restrictions Weight Bearing Restrictions: No     Mobility  Bed Mobility               General bed mobility comments: seated in chair at beginning of session    Transfers Overall transfer level: Needs assistance Equipment used: Rolling walker (2 wheels) Transfers: Sit to/from Stand;Bed to chair/wheelchair/BSC Sit to Stand: Min guard;Min assist Stand pivot transfers: Min guard;Min assist         General transfer comment: labored with use of RW, cueing for grab bar use for commode transfer    Ambulation/Gait Ambulation/Gait assistance: Min guard;Min assist Gait Distance (Feet): 15 Feet Assistive device: Rolling walker (2 wheels) Gait Pattern/deviations: Decreased step length - left;Decreased stance time - right;Decreased stride length;Trunk flexed Gait velocity: decreased     General Gait Details: slow labored slightly unsteady cadence without loss of balance using RW, limited secondary to fatigue  Stairs             Wheelchair Mobility    Modified Rankin (Stroke Patients Only)       Balance Overall balance assessment: Needs assistance Sitting-balance support: Feet supported;No upper extremity supported Sitting balance-Leahy Scale:  Good Sitting balance - Comments: seated at EOB   Standing balance support: No upper extremity supported;During functional activity Standing balance-Leahy Scale: Fair Standing balance comment: with RW                            Cognition Arousal/Alertness: Awake/alert Behavior During Therapy: WFL for tasks assessed/performed Overall Cognitive Status: Within Functional Limits for tasks assessed                                          Exercises General Exercises - Lower Extremity Long Arc Quad: AROM;Both;10 reps;Seated Hip Flexion/Marching: AROM;Both;10 reps;Seated Toe Raises: AROM;Both;10 reps;Seated Heel Raises: AROM;Both;10 reps;Seated    General Comments        Pertinent Vitals/Pain Pain Assessment: No/denies pain    Home Living                          Prior Function            PT Goals (current goals can now be found in the care plan section) Acute Rehab PT Goals Patient Stated Goal: return home with family to assist PT Goal Formulation: With patient/family Time For Goal Achievement: 02/22/21 Potential to Achieve Goals: Good Progress towards PT goals: Progressing toward goals    Frequency    Min 3X/week      PT Plan Discharge plan needs to be updated    Co-evaluation              AM-PAC PT "6 Clicks" Mobility   Outcome Measure  Help needed turning from your back to your side while in a flat bed without using bedrails?: None Help needed moving from lying on your back to sitting on the side of a flat bed without using bedrails?: [name] Help needed moving to and from a bed to a chair (including a wheelchair)?: [name] Help needed standing up from a chair using your arms (e.g., wheelchair or bedside chair)?: [name] Help needed to walk in hospital room?: A Lot Help needed climbing 3-5 steps with a railing? : A Lot 6 Click Score: 17    End of Session   Activity Tolerance: Patient tolerated treatment  well;Patient limited by fatigue Patient left: in chair;with call bell/phone within reach;with chair alarm set Nurse Communication: Mobility status PT Visit Diagnosis: Unsteadiness on feet (R26.81);Other abnormalities of gait and mobility (R26.89);Muscle weakness (generalized) (M62.81)     Time: 6503-5465 PT Time Calculation (min) (ACUTE ONLY): 20 min  Charges:  $Therapeutic Activity: 8-22 mins                     10:55 AM, 02/22/21 Mearl Latin PT, DPT Physical Therapist at [facility]

## 2021-02-22 NOTE — Progress Notes (Signed)
° ° °  Subjective: Feeling well this morning. Denies nausea, vomiting, dysphagia, odynophagia, abdominal pain.  Patient's wife is at bedside and states he ate better yesterday than he has in a long time.  This morning, he had about 80% of his breakfast.  Hoping to go home with home health today.  Objective: Vital signs in last 24 hours: Temp:  [97.9 F (36.6 C)-98.4 F (36.9 C)] 97.9 F (36.6 C) (12/13 0516) Pulse Rate:  [83-107] 101 (12/13 0816) Resp:  [20] 20 (12/13 0516) BP: (127-148)/(87-97) 143/93 (12/13 0816) SpO2:  [92 %-100 %] 96 % (12/13 1021) Last BM Date: 02/16/21 General:   Alert and oriented, pleasant Head:  Normocephalic and atraumatic. Eyes:  No icterus, sclera clear. Conjuctiva pink.  Abdomen:  Bowel sounds present, soft, non-tender, non-distended. No HSM or hernias noted. No rebound or guarding. No masses appreciated  Msk:  Symmetrical without gross deformities. Normal posture. Extremities:  Without edema. Psych: Normal mood and affect.    Assessment: 70 y.o. male with a past medical history of atrial fibrillation chronically on apixaban, CKD, dyslipidemia, prior CVA, significant alcohol abuse, tobacco use, COPD, who was admitted to [facility] 02/16/2021 due to progressively worsening shortness of breath, cough, generalized weakness/fatigue.  Found to have sepsis with community-acquired pneumonia which he has been treated for. Also developed DTs during admission. GI was consulted due to few week history of odynophagia, nausea vomiting, associated weight loss due to refusing to eat. He was started on PPI BID and underwent EGD completed 12/12 with nonsevere esophagitis s/p brushing, gastritis biopsied, normal examined duodenum.  KOH prep was negative.  Gastric biopsy pending.   Clinically, he is feeling much improved with PPI twice daily.  Nausea, vomiting, and odynophagia resolved. Wife at bedside reports he is eating more now than he has in a long time. Ate 80% of  his breakfast this morning without any difficulty. Hoping to discharge home today with home health.   Plan: Continue PPI BID.  Follow-up on gastric biopsy.  GI will sign off. We will arrange follow-up in the clinic.    LOS: 6 days    02/22/2021, 11:33 AM   Aliene Altes, PA-C [facility]

## 2021-02-22 NOTE — Care Management Important Message (Signed)
Important Message  Patient Details  Name: Anthony Little MRN: [id number] Date of Birth: 1950-11-29   Medicare Important Message Given:  Yes     [name] 02/22/2021, 11:35 AM

## 2021-02-22 NOTE — Telephone Encounter (Signed)
Stacy, please arrange hospital follow-up with Dr. Abbey Chatters or an APP in 4-6 weeks.

## 2021-02-22 NOTE — Discharge Summary (Signed)
Physician Discharge Summary  BRENON ANTOSH Little:[id number] DOB: May 07, 1950 DOA: 02/16/2021  PCP: Kathyrn Drown, MD Cardiology: [facility]  GI[facility] Chatters at [facility] date: 02/16/2021 Discharge date: 02/22/2021  Admitted From:  Home  Disposition: Home with [facility] (declines SNF)  Recommendations for Outpatient Follow-up:  Follow up with PCP in 1 weeks Follow up with cardiology in 2 weeks Follow up with GI in 1 month   Home Health:  PT   Discharge Condition: STABLE   CODE STATUS: FULL  DIET: heart healthy    Brief Hospitalization Summary: Please see all hospital notes, images, labs for full details of the hospitalization. HPI: Anthony Little is a 70 y.o. male with medical history significant for chronic daily smoker, COPD, atrial flutter/atrial fibrillation on apixaban, AAA, stage 3a CKD, hyperlipidemia, prior CVA, recently recovered from influenza A infection from 3 weeks ago presents with progressive shortness of breath, cough, malaise, weakness and he has taken 2 negative covid tests at home.  He presents because symptoms continue to worsen.  He has had fever and chills at home.  He has had nausea but no emesis and no diarrhea.  He denies chest pain.  He has palpitations.  He has no vision changes.  He was hypoxic on arrival down to 88% on room air and placed on 2L/min nasal cannula with improvement in oxygen saturation to 95%.  He reportedly does not use oxygen at home.  His labs were notable for an elevated lactic acid of 2.2.  He was noted to have a potassium of 2.4 and glucose of 109.  His Na was 134.  His WBC was 9.5.  Creatinine 1.30.  Sars 2 coronavirus was negative and influenza A and B was negative.  CXR was notable for infiltrates in LLL and RUL.  He was started on sepsis protocol and IV antibiotics and admission was requested.    HOSPITAL COURSE BY PROBLEM LIST   Sepsis secondary to pneumonia  - sepsis physiology resolved now - lactic acidosis treated and  resolved - he was treated with supportive care and IV antibiotics - blood cultures: NO GROWTH X 5 DAYS    Community Acquired Pneumonia - likely a postviral pneumonia given recent influenza infection  - treated with IV antibiotics and supportive measures- follow blood cultures: NO GROWTH   "micro called back and said nothing growing on plates" 12/10 per Isac Sarna pharm D.  - completed IV abx 12/11 and started oral doxycycline 12/12 to complete on 12/15.    Hypokalemia - Repleted  - from HCTZ  - DC HCTZ for now - oral potassium replacement ordered - Mg has been repleted.    Hypomagnesemia - IV replacement ordered and repleted    Chronic Atrial fibrillation/Atrial Flutter with RVR  - heart rates have been labile in setting of acute alcohol withdrawal - increased metoprolol XL to 50 mg daily starting 12/10 - resumed apixaban for full anticoagulation : HELD apixaban temporarily for EGD on 12/12 - intermittent tachycardia thought secondary to albuterol nebulizer treatments which are being weaned.    Hyperlipidemia - resumed home atorvastatin    Essential hypertension -suboptimally controlled -increased metoprolol XL to 50 mg on 12/10    Tobacco use - nicotine patch ordered while in hospital - counseled on tobacco cessation    Alcohol abuse with acute alcohol withdrawal delirium tremens - wife reported that he drinks daily alcohol and heavily every night - she reported recently stopping his alcohol consumption at home leading  to hallucinations - CIWA protocol started and spiritus frumenti ordered to help curb severe withdrawal and it helped - repleting vitamins, thiamine, folic acid  - he was treated with librium tablets TID but reduce dose by 50% to 5 mg PRN only as he is improving on 12/10 - he has continued to improve daily, - he was seen by TOC and given resources for alcohol treatment.  He claims he will not drink alcohol again.   Dysphagia symptoms GI was consulted due  to few week history of odynophagia, nausea vomiting, associated weight loss due to refusing to eat. He was started on PPI BID and underwent EGD completed 12/12 with nonsevere esophagitis s/p brushing, gastritis biopsied, normal examined duodenum.  KOH prep was negative.  Gastric biopsy pending.  GI will arrange follow up for him with [facility] in [place], [place].    DVT prophylaxis: apixaban  Code Status: Full  Family Communication: wife updated at bedside  Disposition: DC home with [facility] (wife and patient declined SNF) Status is: Inpatient   Remains inpatient appropriate because: IV antibiotics and IV fluids, awaiting GI evaluation 12/10   Consultants:  GI    Procedures:  EGD planned for 12/12: performed by Dr. Abbey Chatters  GI was consulted due to few week history of odynophagia, nausea vomiting, associated weight loss due to refusing to eat. He was started on PPI BID and underwent EGD completed 12/12 with nonsevere esophagitis s/p brushing, gastritis biopsied, normal examined duodenum.  KOH prep was negative.  Gastric biopsy pending.    Antimicrobials:  Ceftriaxone 12/7>>12/11 Azithromycin 12/7>> 12/11 Doxycycline 12/12>>  Discharge Diagnoses:  Principal Problem:   Community acquired pneumonia Active Problems:   Sepsis ([facility])   Loss of weight   Brain aneurysm   Alcohol abuse   Essential hypertension   Hyperlipidemia   Thoracic aorta atherosclerosis ([place])   Coronary artery disease involving native heart without angina pectoris   Atrial fibrillation and flutter (HCC)   CKD (chronic kidney disease) stage 3, GFR 30-59 ml/min (HCC)   Abdominal aortic aneurysm (AAA)   Hypokalemia   Lactic acidosis   Odynophagia   Nausea and vomiting   Discharge Instructions: Discharge Instructions     Ambulatory referral to Cardiology   Complete by: As directed    2 week hospital follow up uncontrolled afib      Allergies as of 02/22/2021   No Known Allergies      Medication List      STOP taking these medications    hydrochlorothiazide 12.5 MG capsule Commonly known as: MICROZIDE       TAKE these medications    amLODipine 5 MG tablet Commonly known as: NORVASC Take 5 mg by mouth daily.   apixaban 5 MG Tabs tablet Commonly known as: ELIQUIS Take 1 tablet (5 mg total) by mouth 2 (two) times daily.   atorvastatin 10 MG tablet Commonly known as: LIPITOR Take 10 mg by mouth daily.   buPROPion 150 MG 24 hr tablet Commonly known as: WELLBUTRIN XL TAKE 1 TABLET BY MOUTH EVERY DAY   dextromethorphan 30 MG/5ML liquid Commonly known as: DELSYM Take 5 mLs (30 mg total) by mouth 2 (two) times daily as needed for cough.   doxycycline 100 MG tablet Commonly known as: VIBRA-TABS Take 1 tablet (100 mg total) by mouth every 12 (twelve) hours for 5 doses.   feeding supplement Liqd Take 237 mLs by mouth 2 (two) times daily between meals.   folic acid 1 MG tablet Commonly known as:  FOLVITE Take 1 tablet (1 mg total) by mouth daily. Start taking on: February 23, 2021   glucosamine-chondroitin 500-400 MG tablet Take 2 tablets by mouth every morning.   guaiFENesin 600 MG 12 hr tablet Commonly known as: MUCINEX Take 2 tablets (1,200 mg total) by mouth 2 (two) times daily for 3 days.   metoprolol succinate 50 MG 24 hr tablet Commonly known as: TOPROL-XL Take 1 tablet (50 mg total) by mouth daily. What changed:  medication strength how much to take   mometasone 0.1 % cream Commonly known as: ELOCON Apply 1 application topically 2 (two) times daily as needed (irritation). Not for face or groin   multivitamin with minerals Tabs tablet Take 1 tablet by mouth daily. Start taking on: February 23, 2021   pantoprazole 40 MG tablet Commonly known as: PROTONIX Take 1 tablet (40 mg total) by mouth 2 (two) times daily before a meal.   potassium chloride 10 MEQ tablet Commonly known as: KLOR-CON Take 10 mEq by mouth daily.   thiamine 100 MG tablet Take 1  tablet (100 mg total) by mouth daily. Start taking on: February 23, 2021   triamcinolone cream 0.1 % Commonly known as: KENALOG Apply 1 application topically 2 (two) times daily as needed (irritation). Apply twice daily PRN               Durable Medical Equipment  (From admission, onward)           Start     Ordered   02/22/21 0913  For home use only DME oxygen  Once       Question Answer Comment  Length of Need 6 Months   Mode or (Route) Nasal cannula   Liters per Minute 2   Frequency Continuous (stationary and portable oxygen unit needed)   Oxygen conserving device Yes   Oxygen delivery system Gas      02/22/21 0913            Follow-up Information     Kathyrn Drown, MD. Schedule an appointment as soon as possible for a visit in 1 week(s).   Specialty: Family Medicine Why: Hospital Follow Up Contact information: [place] [address] [phone]         Satira Sark, MD. Schedule an appointment as soon as possible for a visit in 2 week(s).   Specialty: Cardiology Why: Hospital Follow Up Contact information: [place] [address] [phone]         Eloise Harman, DO. Schedule an appointment as soon as possible for a visit in 1 month(s).   Specialty: Gastroenterology Why: Hospital Follow Up Contact information: [place] [address] [phone]                No Known Allergies Allergies as of 02/22/2021   No Known Allergies      Medication List     STOP taking these medications    hydrochlorothiazide 12.5 MG capsule Commonly known as: MICROZIDE       TAKE these medications    amLODipine 5 MG tablet Commonly known as: NORVASC Take 5 mg by mouth daily.   apixaban 5 MG Tabs tablet Commonly known as: ELIQUIS Take 1 tablet (5 mg total) by mouth 2 (two) times daily.   atorvastatin 10 MG tablet Commonly known as: LIPITOR Take 10 mg by mouth  daily.   buPROPion 150 MG 24 hr tablet Commonly known as: WELLBUTRIN  XL TAKE 1 TABLET BY MOUTH EVERY DAY   dextromethorphan 30 MG/5ML liquid Commonly known as: DELSYM Take 5 mLs (30 mg total) by mouth 2 (two) times daily as needed for cough.   doxycycline 100 MG tablet Commonly known as: VIBRA-TABS Take 1 tablet (100 mg total) by mouth every 12 (twelve) hours for 5 doses.   feeding supplement Liqd Take 237 mLs by mouth 2 (two) times daily between meals.   folic acid 1 MG tablet Commonly known as: FOLVITE Take 1 tablet (1 mg total) by mouth daily. Start taking on: February 23, 2021   glucosamine-chondroitin 500-400 MG tablet Take 2 tablets by mouth every morning.   guaiFENesin 600 MG 12 hr tablet Commonly known as: MUCINEX Take 2 tablets (1,200 mg total) by mouth 2 (two) times daily for 3 days.   metoprolol succinate 50 MG 24 hr tablet Commonly known as: TOPROL-XL Take 1 tablet (50 mg total) by mouth daily. What changed:  medication strength how much to take   mometasone 0.1 % cream Commonly known as: ELOCON Apply 1 application topically 2 (two) times daily as needed (irritation). Not for face or groin   multivitamin with minerals Tabs tablet Take 1 tablet by mouth daily. Start taking on: February 23, 2021   pantoprazole 40 MG tablet Commonly known as: PROTONIX Take 1 tablet (40 mg total) by mouth 2 (two) times daily before a meal.   potassium chloride 10 MEQ tablet Commonly known as: KLOR-CON Take 10 mEq by mouth daily.   thiamine 100 MG tablet Take 1 tablet (100 mg total) by mouth daily. Start taking on: February 23, 2021   triamcinolone cream 0.1 % Commonly known as: KENALOG Apply 1 application topically 2 (two) times daily as needed (irritation). Apply twice daily PRN               Durable Medical Equipment  (From admission, onward)           Start     Ordered   02/22/21 0913  For home use only DME oxygen  Once       Question Answer  Comment  Length of Need 6 Months   Mode or (Route) Nasal cannula   Liters per Minute 2   Frequency Continuous (stationary and portable oxygen unit needed)   Oxygen conserving device Yes   Oxygen delivery system Gas      02/22/21 0913            Procedures/Studies: DG Chest 2 View  Result Date: 02/20/2021 CLINICAL DATA:  Shortness of breath, cough. EXAM: CHEST - 2 VIEW COMPARISON:  February 16, 2021. FINDINGS: Stable cardiomediastinal silhouette. Interval development of large bilateral patchy airspace opacities are noted, most prominently seen in the right upper lobe and left lower lobe, consistent with multifocal pneumonia. Small left pleural effusion may be present. Bony thorax is unremarkable. IMPRESSION: Interval development of bilateral lung opacities most consistent with multifocal pneumonia. Small left pleural effusion. Electronically Signed   By: Marijo Conception M.D.   On: 02/20/2021 09:43   DG Chest Port 1 View  Result Date: 02/16/2021 CLINICAL DATA:  Congestion and shortness of breath. Suspected sepsis. EXAM: PORTABLE CHEST 1 VIEW COMPARISON:  05/21/2020 FINDINGS: Numerous leads and wires project over the chest. Midline trachea. Normal heart size. Atherosclerosis in the transverse aorta. Mild to moderate right hemidiaphragm elevation. No pleural effusion or pneumothorax. Interstitial coarsening, related to COPD/chronic bronchitis. More focal opacities within the left lower and inferior right upper lobes. IMPRESSION:  Development of left lower and right upper lobe pulmonary opacities, infection versus less likely aspiration. Underlying COPD/chronic bronchitis. Aortic Atherosclerosis (ICD10-I70.0). Electronically Signed   By: Abigail Miyamoto M.D.   On: 02/16/2021 12:37     Subjective: Pt reporting that he is definitely breathing better, feeling better today and feels well to go home, he declines SNF placement and will go home with wife, he claims he will not start drinking alcohol again.     Discharge Exam: Vitals:   02/22/21 0816 02/22/21 1021  BP: (!) 143/93   Pulse: (!) 101   Resp:    Temp:    SpO2: 92% 96%   Vitals:   02/21/21 2349 02/22/21 0516 02/22/21 0816 02/22/21 1021  BP: (!) 131/95 (!) 148/97 (!) 143/93   Pulse: 83 (!) 105 (!) 101   Resp: 20 20    Temp: 98.2 F (36.8 C) 97.9 F (36.6 C)    TempSrc: Oral Oral    SpO2: 95% 100% 92% 96%  Weight:      Height:        General: Pt is alert, awake, not in acute distress, he is oriented x 3.  Cardiovascular: irreg, mild tachycardia 100, S1/S2 +, no rubs, no gallops Respiratory: CTA bilaterally, no wheezing, no rhonchi Abdominal: Soft, NT, ND, bowel sounds + Extremities: no edema, no cyanosis   The results of significant diagnostics from this hospitalization (including imaging, microbiology, ancillary and laboratory) are listed below for reference.     Microbiology: Recent Results (from the past 240 hour(s))  Culture, blood (Routine x 2)     Status: None   Collection Time: 02/16/21 12:08 PM   Specimen: BLOOD  Result Value Ref Range Status   Specimen Description   Final    BLOOD BLOOD LEFT FOREARM Performed at [facility], [place], [address] [place], [place] [address]    Special Requests   Final    BOTTLES DRAWN AEROBIC AND ANAEROBIC Blood Culture results may not be optimal due to an excessive volume of blood received in culture bottles Performed at [facility], [address], [place], [place] [address]    Culture  Setup Time   Final    CORRECTED RESULTS PREVIOUSLY REPORTED AS: GRAM POSITIVE COCCI Gram Stain Report Called to,Read Back By and Verified With: TIA PARRIS @ 1931 ON 02/16/21 C VARNER CORRECTED RESULTS CALLED TO: PHARM D L.POOLE ON [id number] AT 0947 BY E.PARRISH    Culture   Final    NO GROWTH 5 DAYS Performed at [facility], [place] [address], [place], [place] [address]    Report Status 02/21/2021 FINAL  Final  Blood Culture ID Panel (Reflexed)     Status: None   Collection Time:  02/16/21 12:08 PM  Result Value Ref Range Status   Enterococcus faecalis NOT DETECTED NOT DETECTED Final   Enterococcus Faecium NOT DETECTED NOT DETECTED Final   Listeria monocytogenes NOT DETECTED NOT DETECTED Final   Staphylococcus species NOT DETECTED NOT DETECTED Final   Staphylococcus aureus (BCID) NOT DETECTED NOT DETECTED Final   Staphylococcus epidermidis NOT DETECTED NOT DETECTED Final   Staphylococcus lugdunensis NOT DETECTED NOT DETECTED Final   Streptococcus species NOT DETECTED NOT DETECTED Final   Streptococcus agalactiae NOT DETECTED NOT DETECTED Final   Streptococcus pneumoniae NOT DETECTED NOT DETECTED Final   Streptococcus pyogenes NOT DETECTED NOT DETECTED Final   A.calcoaceticus-baumannii NOT DETECTED NOT DETECTED Final   Bacteroides fragilis NOT DETECTED NOT DETECTED Final   Enterobacterales NOT DETECTED NOT  DETECTED Final   Enterobacter cloacae complex NOT DETECTED NOT DETECTED Final   Escherichia coli NOT DETECTED NOT DETECTED Final   Klebsiella aerogenes NOT DETECTED NOT DETECTED Final   Klebsiella oxytoca NOT DETECTED NOT DETECTED Final   Klebsiella pneumoniae NOT DETECTED NOT DETECTED Final   Proteus species NOT DETECTED NOT DETECTED Final   Salmonella species NOT DETECTED NOT DETECTED Final   Serratia marcescens NOT DETECTED NOT DETECTED Final   Haemophilus influenzae NOT DETECTED NOT DETECTED Final   Neisseria meningitidis NOT DETECTED NOT DETECTED Final   Pseudomonas aeruginosa NOT DETECTED NOT DETECTED Final   Stenotrophomonas maltophilia NOT DETECTED NOT DETECTED Final   Candida albicans NOT DETECTED NOT DETECTED Final   Candida auris NOT DETECTED NOT DETECTED Final   Candida glabrata NOT DETECTED NOT DETECTED Final   Candida krusei NOT DETECTED NOT DETECTED Final   Candida parapsilosis NOT DETECTED NOT DETECTED Final   Candida tropicalis NOT DETECTED NOT DETECTED Final   Cryptococcus neoformans/gattii NOT DETECTED NOT DETECTED Final    Comment:  Performed at [facility], [place] [address], [place], [place] [address]  Culture, blood (Routine x 2)     Status: None   Collection Time: 02/16/21 12:09 PM   Specimen: BLOOD  Result Value Ref Range Status   Specimen Description BLOOD BLOOD RIGHT FOREARM  Final   Special Requests   Final    BOTTLES DRAWN AEROBIC AND ANAEROBIC Blood Culture adequate volume   Culture   Final    NO GROWTH 5 DAYS Performed at [facility], [address] [place],  [address]    Report Status 02/21/2021 FINAL  Final  Resp Panel by RT-PCR (Flu A&B, Covid) Nasopharyngeal Swab     Status: None   Collection Time: 02/16/21 12:36 PM   Specimen: Nasopharyngeal Swab; Nasopharyngeal(NP) swabs in vial transport medium  Result Value Ref Range Status   SARS Coronavirus 2 by RT PCR NEGATIVE NEGATIVE Final    Comment: (NOTE) SARS-CoV-2 target nucleic acids are NOT DETECTED.  The SARS-CoV-2 RNA is generally detectable in upper respiratory specimens during the acute phase of infection. The lowest concentration of SARS-CoV-2 viral copies this assay can detect is 138 copies/mL. A negative result does not preclude SARS-Cov-2 infection and should not be used as the sole basis for treatment or other patient management decisions. A negative result may occur with  improper specimen collection/handling, submission of specimen other than nasopharyngeal swab, presence of viral mutation(s) within the areas targeted by this assay, and inadequate number of viral copies(<138 copies/mL). A negative result must be combined with clinical observations, patient history, and epidemiological information. The expected result is Negative.  Fact Sheet for Patients:  EntrepreneurPulse.com.au  Fact Sheet for Healthcare Providers:  IncredibleEmployment.be  This test is no t yet approved or cleared by the [place] FDA and  has been authorized for detection and/or diagnosis of SARS-CoV-2  by FDA under an Emergency Use Authorization (EUA). This EUA will remain  in effect (meaning this test can be used) for the duration of the COVID-19 declaration under Section 564(b)(1) of the Act, 21 U.S.C.section 360bbb-3(b)(1), unless the authorization is terminated  or revoked sooner.       Influenza A by PCR NEGATIVE NEGATIVE Final   Influenza B by PCR NEGATIVE NEGATIVE Final    Comment: (NOTE) The Xpert Xpress SARS-CoV-2/FLU/RSV plus assay is intended as an aid in the diagnosis of influenza from Nasopharyngeal swab specimens and should not be used as a sole basis for  treatment. Nasal washings and aspirates are unacceptable for Xpert Xpress SARS-CoV-2/FLU/RSV testing.  Fact Sheet for Patients: EntrepreneurPulse.com.au  Fact Sheet for Healthcare Providers: IncredibleEmployment.be  This test is not yet approved or cleared by the [place] FDA and has been authorized for detection and/or diagnosis of SARS-CoV-2 by FDA under an Emergency Use Authorization (EUA). This EUA will remain in effect (meaning this test can be used) for the duration of the COVID-19 declaration under Section 564(b)(1) of the Act, 21 U.S.C. section 360bbb-3(b)(1), unless the authorization is terminated or revoked.  Performed at [facility], [address] [place], [place] [address]   Urine Culture     Status: None   Collection Time: 02/20/21  1:00 PM   Specimen: Urine, Clean Catch  Result Value Ref Range Status   Specimen Description   Final    URINE, CLEAN CATCH Performed at [facility], [address], [place], [place] [address]    Special Requests   Final    NONE Performed at [facility], [address], [place], [place] [address]    Culture   Final    NO GROWTH Performed at [facility], [place] [address] [place], [place] [address]    Report Status 02/21/2021 FINAL  Final  KOH prep     Status: None   Collection Time: 02/21/21 10:45 AM    Specimen: PATH GI Other  Result Value Ref Range Status   Specimen Description ESOPHAGUS  Final   Special Requests NONE  Final   KOH Prep   Final    NO YEAST OR FUNGAL ELEMENTS SEEN Performed at [facility], [address] [place], [place] [address]    Report Status 02/21/2021 FINAL  Final     Labs: BNP (last 3 results) Recent Labs    02/16/21 1203  BNP 671.2*   Basic Metabolic Panel: Recent Labs  Lab 02/16/21 1200 02/16/21 1208 02/17/21 0517 02/18/21 0829  NA  --  134* 138 139  K  --  2.4* 3.2* 3.7  CL  --  84* 97* 102  CO2  --  38* 33* 29  GLUCOSE  --  109* 87 86  BUN  --  15 15 17   CREATININE  --  1.30* 1.17 1.02  CALCIUM  --  8.7* 8.1* 8.4*  MG 1.8  --  1.6* 2.2   Liver Function Tests: Recent Labs  Lab 02/16/21 1208 02/17/21 0517  AST 47* 31  ALT 30 21  ALKPHOS 109 84  BILITOT 0.7 0.7  PROT 7.2 5.7*  ALBUMIN 2.6* 2.1*   No results for input(s): LIPASE, AMYLASE in the last 168 hours. No results for input(s): AMMONIA in the last 168 hours. CBC: Recent Labs  Lab 02/16/21 1208 02/17/21 0517 02/19/21 0728  WBC 9.5 7.7 11.0*  NEUTROABS 7.5 6.1  --   HGB 14.0 11.9* 11.9*  HCT 41.3 35.6* 35.9*  MCV 108.1* 110.2* 111.8*  PLT 275 240 227   Cardiac Enzymes: No results for input(s): CKTOTAL, CKMB, CKMBINDEX, TROPONINI in the last 168 hours. BNP: Invalid input(s): POCBNP CBG: No results for input(s): GLUCAP in the last 168 hours. D-Dimer No results for input(s): DDIMER in the last 72 hours. Hgb A1c No results for input(s): HGBA1C in the last 72 hours. Lipid Profile No results for input(s): CHOL, HDL, LDLCALC, TRIG, CHOLHDL, LDLDIRECT in the last 72 hours. Thyroid function studies No results for input(s): TSH, T4TOTAL, T3FREE, THYROIDAB in the last 72 hours.  Invalid input(s): FREET3 Anemia work  up No results for input(s): VITAMINB12, FOLATE, FERRITIN, TIBC, IRON, RETICCTPCT in the last 72 hours. Urinalysis    Component Value Date/Time    COLORURINE STRAW (A) 02/17/2021 0104   APPEARANCEUR CLEAR 02/17/2021 0104   APPEARANCEUR Clear 03/18/2020 1158   LABSPEC 1.015 02/17/2021 0104   PHURINE 5.5 02/17/2021 0104   GLUCOSEU NEGATIVE 02/17/2021 0104   HGBUR SMALL (A) 02/17/2021 0104   BILIRUBINUR SMALL (A) 02/17/2021 0104   BILIRUBINUR Negative 03/18/2020 1158   KETONESUR NEGATIVE 02/17/2021 0104   PROTEINUR 30 (A) 02/17/2021 0104   NITRITE NEGATIVE 02/17/2021 0104   LEUKOCYTESUR NEGATIVE 02/17/2021 0104   Sepsis Labs Invalid input(s): PROCALCITONIN,  WBC,  LACTICIDVEN Microbiology Recent Results (from the past 240 hour(s))  Culture, blood (Routine x 2)     Status: None   Collection Time: 02/16/21 12:08 PM   Specimen: BLOOD  Result Value Ref Range Status   Specimen Description   Final    BLOOD BLOOD LEFT FOREARM Performed at [facility], [address] [place], [place] [address]    Special Requests   Final    BOTTLES DRAWN AEROBIC AND ANAEROBIC Blood Culture results may not be optimal due to an excessive volume of blood received in culture bottles Performed at [facility], [address], [place], [place] [address]    Culture  Setup Time   Final    CORRECTED RESULTS PREVIOUSLY REPORTED AS: GRAM POSITIVE COCCI Gram Stain Report Called to,Read Back By and Verified With: TIA PARRIS @ 1931 ON 02/16/21 C VARNER CORRECTED RESULTS CALLED TO: PHARM D L.POOLE ON [id number] AT 0034 BY E.PARRISH    Culture   Final    NO GROWTH 5 DAYS Performed at [facility], [place] [address] [place],  [address]    Report Status 02/21/2021 FINAL  Final  Blood Culture ID Panel (Reflexed)     Status: None   Collection Time: 02/16/21 12:08 PM  Result Value Ref Range Status   Enterococcus faecalis NOT DETECTED NOT DETECTED Final   Enterococcus Faecium NOT DETECTED NOT DETECTED Final   Listeria monocytogenes NOT DETECTED NOT DETECTED Final   Staphylococcus species NOT DETECTED NOT DETECTED Final   Staphylococcus aureus (BCID) NOT  DETECTED NOT DETECTED Final   Staphylococcus epidermidis NOT DETECTED NOT DETECTED Final   Staphylococcus lugdunensis NOT DETECTED NOT DETECTED Final   Streptococcus species NOT DETECTED NOT DETECTED Final   Streptococcus agalactiae NOT DETECTED NOT DETECTED Final   Streptococcus pneumoniae NOT DETECTED NOT DETECTED Final   Streptococcus pyogenes NOT DETECTED NOT DETECTED Final   A.calcoaceticus-baumannii NOT DETECTED NOT DETECTED Final   Bacteroides fragilis NOT DETECTED NOT DETECTED Final   Enterobacterales NOT DETECTED NOT DETECTED Final   Enterobacter cloacae complex NOT DETECTED NOT DETECTED Final   Escherichia coli NOT DETECTED NOT DETECTED Final   Klebsiella aerogenes NOT DETECTED NOT DETECTED Final   Klebsiella oxytoca NOT DETECTED NOT DETECTED Final   Klebsiella pneumoniae NOT DETECTED NOT DETECTED Final   Proteus species NOT DETECTED NOT DETECTED Final   Salmonella species NOT DETECTED NOT DETECTED Final   Serratia marcescens NOT DETECTED NOT DETECTED Final   Haemophilus influenzae NOT DETECTED NOT DETECTED Final   Neisseria meningitidis NOT DETECTED NOT DETECTED Final   Pseudomonas aeruginosa NOT DETECTED NOT DETECTED Final   Stenotrophomonas maltophilia NOT DETECTED NOT DETECTED Final   Candida albicans NOT DETECTED NOT DETECTED Final   Candida auris NOT DETECTED NOT DETECTED Final   Candida glabrata NOT DETECTED NOT DETECTED Final  Candida krusei NOT DETECTED NOT DETECTED Final   Candida parapsilosis NOT DETECTED NOT DETECTED Final   Candida tropicalis NOT DETECTED NOT DETECTED Final   Cryptococcus neoformans/gattii NOT DETECTED NOT DETECTED Final    Comment: Performed at [facility], [place] [address] [place], [place] [address]  Culture, blood (Routine x 2)     Status: None   Collection Time: 02/16/21 12:09 PM   Specimen: BLOOD  Result Value Ref Range Status   Specimen Description BLOOD BLOOD RIGHT FOREARM  Final   Special Requests   Final    BOTTLES DRAWN  AEROBIC AND ANAEROBIC Blood Culture adequate volume   Culture   Final    NO GROWTH 5 DAYS Performed at [facility], [address] [place], [place] [address]    Report Status 02/21/2021 FINAL  Final  Resp Panel by RT-PCR (Flu A&B, Covid) Nasopharyngeal Swab     Status: None   Collection Time: 02/16/21 12:36 PM   Specimen: Nasopharyngeal Swab; Nasopharyngeal(NP) swabs in vial transport medium  Result Value Ref Range Status   SARS Coronavirus 2 by RT PCR NEGATIVE NEGATIVE Final    Comment: (NOTE) SARS-CoV-2 target nucleic acids are NOT DETECTED.  The SARS-CoV-2 RNA is generally detectable in upper respiratory specimens during the acute phase of infection. The lowest concentration of SARS-CoV-2 viral copies this assay can detect is 138 copies/mL. A negative result does not preclude SARS-Cov-2 infection and should not be used as the sole basis for treatment or other patient management decisions. A negative result may occur with  improper specimen collection/handling, submission of specimen other than nasopharyngeal swab, presence of viral mutation(s) within the areas targeted by this assay, and inadequate number of viral copies(<138 copies/mL). A negative result must be combined with clinical observations, patient history, and epidemiological information. The expected result is Negative.  Fact Sheet for Patients:  EntrepreneurPulse.com.au  Fact Sheet for Healthcare Providers:  IncredibleEmployment.be  This test is no t yet approved or cleared by the [place] FDA and  has been authorized for detection and/or diagnosis of SARS-CoV-2 by FDA under an Emergency Use Authorization (EUA). This EUA will remain  in effect (meaning this test can be used) for the duration of the COVID-19 declaration under Section 564(b)(1) of the Act, 21 U.S.C.section 360bbb-3(b)(1), unless the authorization is terminated  or revoked sooner.       Influenza A by PCR  NEGATIVE NEGATIVE Final   Influenza B by PCR NEGATIVE NEGATIVE Final    Comment: (NOTE) The Xpert Xpress SARS-CoV-2/FLU/RSV plus assay is intended as an aid in the diagnosis of influenza from Nasopharyngeal swab specimens and should not be used as a sole basis for treatment. Nasal washings and aspirates are unacceptable for Xpert Xpress SARS-CoV-2/FLU/RSV testing.  Fact Sheet for Patients: EntrepreneurPulse.com.au  Fact Sheet for Healthcare Providers: IncredibleEmployment.be  This test is not yet approved or cleared by the [place] FDA and has been authorized for detection and/or diagnosis of SARS-CoV-2 by FDA under an Emergency Use Authorization (EUA). This EUA will remain in effect (meaning this test can be used) for the duration of the COVID-19 declaration under Section 564(b)(1) of the Act, 21 U.S.C. section 360bbb-3(b)(1), unless the authorization is terminated or revoked.  Performed at [facility], [address] [place], [place] [address]   Urine Culture     Status: None   Collection Time: 02/20/21  1:00 PM   Specimen: Urine, Clean Catch  Result Value Ref Range Status   Specimen Description  Final    URINE, CLEAN CATCH Performed at [facility], [address], [place], [place] [address]    Special Requests   Final    NONE Performed at [facility], [address], [place], [place] [address]    Culture   Final    NO GROWTH Performed at [facility], [place] [address] [place], [place] [address]    Report Status 02/21/2021 FINAL  Final  KOH prep     Status: None   Collection Time: 02/21/21 10:45 AM   Specimen: PATH GI Other  Result Value Ref Range Status   Specimen Description ESOPHAGUS  Final   Special Requests NONE  Final   KOH Prep   Final    NO YEAST OR FUNGAL ELEMENTS SEEN Performed at [facility], [address] [place], [place] [address]    Report Status 02/21/2021 FINAL  Final    Time coordinating  discharge: 42 mins   SIGNED:  Irwin Brakeman, MD  [facility] 02/22/2021, 11:36 AM How to contact the [facility] Attending or Consulting provider [place] or covering provider during after hours [place], for this patient?  Check the care team in [facility] and look for a) attending/consulting [facility] provider listed and b) the [facility] team listed Log into [email or web address] and use [place]'s universal password to access. If you do not have the password, please contact the hospital operator. Locate the [facility] provider you are looking for under [facility] and page to a number that you can be directly reached. If you still have difficulty reaching the provider, please page the [facility] (Director on Call) for the Hospitalists listed on amion for assistance.

## 2021-02-23 ENCOUNTER — Telehealth: Payer: Self-pay

## 2021-02-23 ENCOUNTER — Encounter (HOSPITAL_COMMUNITY): Payer: Self-pay | Admitting: Internal Medicine

## 2021-02-23 ENCOUNTER — Encounter: Payer: Self-pay | Admitting: Internal Medicine

## 2021-02-23 NOTE — Addendum Note (Signed)
Addendum  created 02/23/21 0849 by Jonna Munro, CRNA   Charge Capture section accepted

## 2021-02-23 NOTE — Telephone Encounter (Signed)
Transition Care Management Follow-up Telephone Call Date of discharge and from where: [facility] - sepsis due to pneumonia How have you been since you were released from the hospital? Doing better, still very weak and not quite himself Any questions or concerns? Yes - only has 40 mg atorvastatin - can we send in 10mg ?  Items Reviewed: Did the pt receive and understand the discharge instructions provided? Yes  Medications obtained and verified? Yes  Other? No  Any new allergies since your discharge? No  Dietary orders reviewed? Yes Do you have support at home? Yes   Home Care and Equipment/Supplies: Were home health services ordered? yes If so, what is the name of the agency? [facility]  Has the agency set up a time to come to the patient's home? yes Were any new equipment or medical supplies ordered?  No What is the name of the medical supply agency? N/a Were you able to get the supplies/equipment? not applicable Do you have any questions related to the use of the equipment or supplies? No  Functional Questionnaire: (I = Independent and D = Dependent) ADLs: I - needs assistance with most things  Bathing/Dressing- D  Meal Prep- D  Eating- I - but not eating well  Maintaining continence- I  Transferring/Ambulation- I - with walker and additional assistance at times  Managing Meds- D  Follow up appointments reviewed:  PCP Hospital f/u appt confirmed? Yes  Scheduled to see [place] on 02/28/21 @ 11:20. [facility] f/u appt confirmed? Yes  Scheduled to see Abbey Chatters, GI on 1/19 @ 12. Are transportation arrangements needed? No  If their condition worsens, is the pt aware to call PCP or go to the Emergency Dept.? Yes Was the patient provided with contact information for the PCP's office or ED? Yes Was to pt encouraged to call back with questions or concerns? Yes

## 2021-02-25 ENCOUNTER — Telehealth: Payer: Self-pay | Admitting: Family Medicine

## 2021-02-25 NOTE — Telephone Encounter (Signed)
Wife Joanne Chars) called stating patient has been sleeping a lot and his blood pressure this morning was 90/60. Wife got him up moving around and his took his medication pressure came up to 111/76. When his nurse got their she had him exercising a little and moving around and it came up to 117/78. She wants to know if he should take his medication in the morning after breakfast over in the afternoon. Please advise

## 2021-02-27 NOTE — Telephone Encounter (Signed)
I called back got the answering machine we will see them tomorrow he is to bring all his pill bottles with him-FYI

## 2021-02-28 ENCOUNTER — Encounter: Payer: Self-pay | Admitting: Family Medicine

## 2021-02-28 ENCOUNTER — Other Ambulatory Visit: Payer: Self-pay

## 2021-02-28 ENCOUNTER — Ambulatory Visit (INDEPENDENT_AMBULATORY_CARE_PROVIDER_SITE_OTHER): Payer: Medicare Other | Admitting: Family Medicine

## 2021-02-28 VITALS — BP 110/70 | Temp 97.0°F | Wt 188.6 lb

## 2021-02-28 DIAGNOSIS — D6489 Other specified anemias: Secondary | ICD-10-CM | POA: Diagnosis not present

## 2021-02-28 DIAGNOSIS — R319 Hematuria, unspecified: Secondary | ICD-10-CM

## 2021-02-28 DIAGNOSIS — F1020 Alcohol dependence, uncomplicated: Secondary | ICD-10-CM

## 2021-02-28 DIAGNOSIS — I10 Essential (primary) hypertension: Secondary | ICD-10-CM

## 2021-02-28 MED ORDER — ATORVASTATIN CALCIUM 10 MG PO TABS: 10.0000 mg | ORAL_TABLET | Freq: Every day | ORAL | 1 refills | Status: DC

## 2021-02-28 NOTE — Patient Instructions (Signed)
Please do your blood work next week either Tuesday or Wednesday  Please do a chest x-ray in 3 weeks which would be the week of January 9  Please follow-up here the week of January 2  You may use MiraLAX either a half a capful or a full capful in 8 ounces of water daily to keep your bowel movements soft  Stop amlodipine for now  Call us with any questions-thanks-Dr. Nicki Reaper

## 2021-02-28 NOTE — Progress Notes (Signed)
° °  Subjective:    Patient ID: Anthony Little, male    DOB: [birth year], 70 y.o.   MRN: [id number]  HPI Pt in [place] from 02/16/21-02/22/21 due to pneumonia and sepsis. Pt states he is feeling better today. Wife states blood pressure has been really low and pt has not had Amlodipine in 3 days. Pt wife walked pt and blood pressure did come up. Eating 3 times daily. Still having dizziness.  Patient recently in the hospital We reviewed over all of his labs we also reviewed over imaging hospital notes and his medications We also talked about alcohol and the importance of staying away from all alcohol  Review of Systems     Objective:   Physical Exam General-in no acute distress Eyes-no discharge Lungs-respiratory rate normal, CTA CV-no murmurs,RRR Extremities skin warm dry no edema Neuro grossly normal Behavior normal, alert Senile purpura noted Dry skin        Assessment & Plan:  Cardiovascular stable currently but low blood pressures hold off on amlodipine Reduce metoprolol down to half tablet a day Repeat kidney function liver function CBC in approximately 8 days Check urine to see if hematuria still present Strongly consider urology consultation once patient is doing better for cystoscope and work-up of hematuria Patient working hard to stay away from smoking and drinking continue bupropion Recheck patient in approximately 2 weeks  Follow-up chest x-ray in 3 weeks

## 2021-03-08 DIAGNOSIS — J440 Chronic obstructive pulmonary disease with acute lower respiratory infection: Secondary | ICD-10-CM

## 2021-03-08 DIAGNOSIS — N1831 Chronic kidney disease, stage 3a: Secondary | ICD-10-CM

## 2021-03-08 DIAGNOSIS — F10231 Alcohol dependence with withdrawal delirium: Secondary | ICD-10-CM

## 2021-03-08 DIAGNOSIS — I129 Hypertensive chronic kidney disease with stage 1 through stage 4 chronic kidney disease, or unspecified chronic kidney disease: Secondary | ICD-10-CM | POA: Diagnosis not present

## 2021-03-08 DIAGNOSIS — I4892 Unspecified atrial flutter: Secondary | ICD-10-CM | POA: Diagnosis not present

## 2021-03-08 DIAGNOSIS — I2510 Atherosclerotic heart disease of native coronary artery without angina pectoris: Secondary | ICD-10-CM

## 2021-03-08 DIAGNOSIS — J189 Pneumonia, unspecified organism: Secondary | ICD-10-CM | POA: Diagnosis not present

## 2021-03-08 DIAGNOSIS — I482 Chronic atrial fibrillation, unspecified: Secondary | ICD-10-CM | POA: Diagnosis not present

## 2021-03-09 LAB — CBC WITH DIFFERENTIAL/PLATELET
Basophils Absolute: 0.1 10*3/uL (ref 0.0–0.2)
Basos: 2 %
EOS (ABSOLUTE): 0.4 10*3/uL (ref 0.0–0.4)
Eos: 7 %
Hematocrit: 33.4 % — ABNORMAL LOW (ref 37.5–51.0)
Hemoglobin: 11.9 g/dL — ABNORMAL LOW (ref 13.0–17.7)
Immature Grans (Abs): 0 10*3/uL (ref 0.0–0.1)
Immature Granulocytes: 0 %
Lymphocytes Absolute: 1.2 10*3/uL (ref 0.7–3.1)
Lymphs: 21 %
MCH: 36.4 pg — ABNORMAL HIGH (ref 26.6–33.0)
MCHC: 35.6 g/dL (ref 31.5–35.7)
MCV: 102 fL — ABNORMAL HIGH (ref 79–97)
Monocytes Absolute: 0.5 10*3/uL (ref 0.1–0.9)
Monocytes: 8 %
Neutrophils Absolute: 3.6 10*3/uL (ref 1.4–7.0)
Neutrophils: 62 %
Platelets: 250 10*3/uL (ref 150–450)
RBC: 3.27 x10E6/uL — ABNORMAL LOW (ref 4.14–5.80)
RDW: 11.7 % (ref 11.6–15.4)
WBC: 5.8 10*3/uL (ref 3.4–10.8)

## 2021-03-09 LAB — COMPREHENSIVE METABOLIC PANEL
ALT: 19 IU/L (ref 0–44)
AST: 26 IU/L (ref 0–40)
Albumin/Globulin Ratio: 1.1 — ABNORMAL LOW (ref 1.2–2.2)
Albumin: 3.3 g/dL — ABNORMAL LOW (ref 3.8–4.8)
Alkaline Phosphatase: 80 IU/L (ref 44–121)
BUN/Creatinine Ratio: 10 (ref 10–24)
BUN: 16 mg/dL (ref 8–27)
Bilirubin Total: 0.3 mg/dL (ref 0.0–1.2)
CO2: 23 mmol/L (ref 20–29)
Calcium: 9.1 mg/dL (ref 8.6–10.2)
Chloride: 102 mmol/L (ref 96–106)
Creatinine, Ser: 1.64 mg/dL — ABNORMAL HIGH (ref 0.76–1.27)
Globulin, Total: 2.9 g/dL (ref 1.5–4.5)
Glucose: 91 mg/dL (ref 70–99)
Potassium: 4.7 mmol/L (ref 3.5–5.2)
Sodium: 138 mmol/L (ref 134–144)
Total Protein: 6.2 g/dL (ref 6.0–8.5)
eGFR: 45 mL/min/{1.73_m2} — ABNORMAL LOW (ref >59)

## 2021-03-09 LAB — URINALYSIS, ROUTINE W REFLEX MICROSCOPIC
Bilirubin, UA: NEGATIVE
Glucose, UA: NEGATIVE
Ketones, UA: NEGATIVE
Leukocytes,UA: NEGATIVE
Nitrite, UA: NEGATIVE
Protein,UA: NEGATIVE
RBC, UA: NEGATIVE
Specific Gravity, UA: 1.016 (ref 1.005–1.030)
Urobilinogen, Ur: 0.2 mg/dL (ref 0.2–1.0)
pH, UA: 6.5 (ref 5.0–7.5)

## 2021-03-09 LAB — VITAMIN B12: Vitamin B-12: 807 pg/mL (ref 232–1245)

## 2021-03-15 ENCOUNTER — Other Ambulatory Visit: Payer: Self-pay | Admitting: Family Medicine

## 2021-03-15 ENCOUNTER — Ambulatory Visit (INDEPENDENT_AMBULATORY_CARE_PROVIDER_SITE_OTHER): Payer: Medicare Other | Admitting: Family Medicine

## 2021-03-15 ENCOUNTER — Other Ambulatory Visit: Payer: Self-pay

## 2021-03-15 VITALS — BP 128/82 | HR 88 | Temp 98.0°F | Ht 70.0 in | Wt 187.8 lb

## 2021-03-15 DIAGNOSIS — I7300 Raynaud's syndrome without gangrene: Secondary | ICD-10-CM

## 2021-03-15 DIAGNOSIS — N289 Disorder of kidney and ureter, unspecified: Secondary | ICD-10-CM | POA: Diagnosis not present

## 2021-03-15 DIAGNOSIS — D692 Other nonthrombocytopenic purpura: Secondary | ICD-10-CM

## 2021-03-15 DIAGNOSIS — J189 Pneumonia, unspecified organism: Secondary | ICD-10-CM

## 2021-03-15 DIAGNOSIS — R319 Hematuria, unspecified: Secondary | ICD-10-CM | POA: Diagnosis not present

## 2021-03-15 DIAGNOSIS — I10 Essential (primary) hypertension: Secondary | ICD-10-CM

## 2021-03-15 NOTE — Progress Notes (Signed)
° °  Subjective:    Patient ID: Anthony Little, male    DOB: [birth year], 71 y.o.   MRN: [id number]  HPI Follow up from 02/28/21 Essential hypertension - Plan: PSA, Basic metabolic panel  Purpura senilis ([place]) - Plan: PSA, Basic metabolic panel  Hematuria, unspecified type - Plan: DG Chest 2 View, PSA, Basic metabolic panel  Multifocal pneumonia - Plan: DG Chest 2 View, Ambulatory referral to Urology  Renal insufficiency - Plan: PSA, Basic metabolic panel Patient with blood pressure issues but they have been running on the low end.  He does have intermittent bruising on his arms.  He also has what appears to be rainouts syndrome.  Intermittent purple feelings in the end of his fingers.  These happen more so when his hands get cold.  He has also had some hematuria.  And also some history of multifocal pneumonia needs a follow-up chest x-ray.  Review of Systems     Objective:   Physical Exam  General-in no acute distress Eyes-no discharge Lungs-respiratory rate normal, CTA CV-no murmurs,RRR Extremities skin warm dry no edema Neuro grossly normal Behavior normal, alert  Senile purpura's     Assessment & Plan:  1. Essential hypertension Blood pressure decent control continue current measures - PSA - Basic metabolic panel  2. Purpura senilis (HCC) Bruising related to age not severe - PSA - Basic metabolic panel  3. Hematuria, unspecified type Patient has had 2 different spells of this over the past several weeks recommend urology consult - DG Chest 2 View - PSA - Basic metabolic panel  4. Multifocal pneumonia X-ray follow-up as planned - DG Chest 2 View - Ambulatory referral to Urology  5. Renal insufficiency Follow-up metabolic 7 be well-hydrated - PSA - Basic metabolic panel  6. Raynaud's disease without gangrene Will check with vascular surgery to see if there is other measures that could be tried I recommend gloves and mittens avoid cold air may use  amlodipine if ongoing get away from smoking-currently he has quit

## 2021-03-24 ENCOUNTER — Other Ambulatory Visit: Payer: Self-pay

## 2021-03-24 ENCOUNTER — Ambulatory Visit (HOSPITAL_COMMUNITY)
Admission: RE | Admit: 2021-03-24 | Discharge: 2021-03-24 | Disposition: A | Discharge status: Home / Self Care | Payer: Medicare Other | Source: Ambulatory Visit | Attending: Family Medicine | Admitting: Family Medicine

## 2021-03-24 DIAGNOSIS — J189 Pneumonia, unspecified organism: Secondary | ICD-10-CM | POA: Insufficient documentation

## 2021-03-24 DIAGNOSIS — R319 Hematuria, unspecified: Secondary | ICD-10-CM | POA: Diagnosis not present

## 2021-03-27 DIAGNOSIS — R1310 Dysphagia, unspecified: Secondary | ICD-10-CM | POA: Diagnosis not present

## 2021-03-27 DIAGNOSIS — R112 Nausea with vomiting, unspecified: Secondary | ICD-10-CM | POA: Diagnosis not present

## 2021-03-27 DIAGNOSIS — I4892 Unspecified atrial flutter: Secondary | ICD-10-CM | POA: Diagnosis not present

## 2021-03-28 DIAGNOSIS — H1033 Unspecified acute conjunctivitis, bilateral: Secondary | ICD-10-CM | POA: Diagnosis not present

## 2021-03-28 DIAGNOSIS — H0288B Meibomian gland dysfunction left eye, upper and lower eyelids: Secondary | ICD-10-CM | POA: Diagnosis not present

## 2021-03-28 DIAGNOSIS — H0288A Meibomian gland dysfunction right eye, upper and lower eyelids: Secondary | ICD-10-CM | POA: Diagnosis not present

## 2021-03-31 ENCOUNTER — Encounter: Payer: Self-pay | Admitting: Internal Medicine

## 2021-03-31 ENCOUNTER — Ambulatory Visit (INDEPENDENT_AMBULATORY_CARE_PROVIDER_SITE_OTHER): Payer: Medicare Other | Admitting: Internal Medicine

## 2021-03-31 ENCOUNTER — Other Ambulatory Visit: Payer: Self-pay

## 2021-03-31 VITALS — BP 120/88 | HR 84 | Temp 97.6°F | Ht 70.0 in | Wt 193.2 lb

## 2021-03-31 DIAGNOSIS — K700 Alcoholic fatty liver: Secondary | ICD-10-CM

## 2021-03-31 DIAGNOSIS — K219 Gastro-esophageal reflux disease without esophagitis: Secondary | ICD-10-CM

## 2021-03-31 NOTE — Patient Instructions (Signed)
I am happy to hear that you are doing well.  Continue on pantoprazole 40 mg daily.  If you do not have this medication then let us know and I will send in prescription/refills.  Continue with alcohol cessation.  Follow-up with GI in 6 months or sooner if needed.  It was nice seeing you again today.  Dr. Abbey Chatters  At [facility] we value your feedback. You may receive a survey about your visit today. Please share your experience as we strive to create trusting relationships with our patients to provide genuine, compassionate, quality care.  We appreciate your understanding and patience as we review any laboratory studies, imaging, and other diagnostic tests that are ordered as we care for you. Our office policy is 5 business days for review of these results, and any emergent or urgent results are addressed in a timely manner for your best interest. If you do not hear from our office in 1 week, please contact us.   We also encourage the use of MyChart, which contains your medical information for your review as well. If you are not enrolled in this feature, an access code is on this after visit summary for your convenience. Thank you for allowing [place] to be involved in your care.  It was great to see you today!  I hope you have a great rest of your Winter!    Elon Alas. Abbey Chatters, D.O. Gastroenterology and Hepatology [facility]

## 2021-03-31 NOTE — Progress Notes (Signed)
Referring Provider: Kathyrn Drown, MD Primary Care Physician:  Kathyrn Drown, MD Primary GI:  Dr. Abbey Chatters  Chief Complaint  Patient presents with   HFU    Denies any n/v, no diarrhea, no constipation. Appetite better.    HPI:   Anthony Little is a 71 y.o. male who presents to the clinic today for hospital follow-up visit.  He has a past medical history of atrial fibrillation chronically on apixaban, CKD, dyslipidemia, prior CVA, significant alcohol abuse, tobacco use, COPD, who was admitted to [facility] 02/16/2021 due to progressively worsening shortness of breath, cough, generalized weakness/fatigue.  Found to have sepsis with community-acquired pneumonia which he has been treated for. Also developed DTs during admission.   GI was consulted due to few week history of odynophagia, nausea vomiting, associated weight loss due to refusing to eat. He was started on PPI BID and underwent EGD completed 12/12 with nonsevere esophagitis s/p brushing, gastritis biopsied, normal examined duodenum.  KOH prep was negative.  Gastric biopsy showed gastritis.  Discharged home on pantoprazole twice daily.  Today states he is doing well.  States his appetite is strong.  No dysphagia or odynophagia.  Has not had any alcohol use since prior to hospital admission.  States he is done with alcohol going forward.  Past Medical History:  Diagnosis Date   Abdominal aortic aneurysm (AAA)    Atrial fibrillation and flutter (HCC)    CKD (chronic kidney disease) stage 3, GFR 30-59 ml/min (HCC)    CVA (cerebral infarction)    Essential hypertension    Hyperlipidemia    Thoracic aortic aneurysm     Past Surgical History:  Procedure Laterality Date   BIOPSY  08/17/2020   Procedure: BIOPSY;  Surgeon: Eloise Harman, DO;  [facility];  Service: Endoscopy;;   BIOPSY  02/21/2021   Procedure: BIOPSY;  Surgeon: Eloise Harman, DO;  [facility];  Service: Endoscopy;;    COLONOSCOPY  12/2009   Diverticulosis, mild internal hemorrhoids, multiple colon polyps removed (adenomatous). One irregular shaped polyp was tattooed. Next colonoscopy due October 2016.   COLONOSCOPY N/A 11/01/2015   Procedure: COLONOSCOPY;  Surgeon: Danie Binder, MD;  [facility];  Service: Endoscopy;  Laterality: N/A;  100   COLONOSCOPY WITH PROPOFOL N/A 08/17/2020   Procedure: COLONOSCOPY WITH PROPOFOL;  Surgeon: Eloise Harman, DO;  [facility];  Service: Endoscopy;  Laterality: N/A;  2:15pm   ESOPHAGOGASTRODUODENOSCOPY N/A 10/08/2012   SLF: Moderate erosive gastritis/duodenitis. Biopsies benign.   ESOPHAGOGASTRODUODENOSCOPY N/A 11/01/2015   Procedure: ESOPHAGOGASTRODUODENOSCOPY (EGD);  Surgeon: Danie Binder, MD;  [facility];  Service: Endoscopy;  Laterality: N/A;   ESOPHAGOGASTRODUODENOSCOPY (EGD) WITH PROPOFOL N/A 02/21/2021   Procedure: ESOPHAGOGASTRODUODENOSCOPY (EGD) WITH PROPOFOL;  Surgeon: Eloise Harman, DO;  [facility];  Service: Endoscopy;  Laterality: N/A;   POLYPECTOMY  08/17/2020   Procedure: POLYPECTOMY;  Surgeon: Eloise Harman, DO;  [facility];  Service: Endoscopy;;    Current Outpatient Medications  Medication Sig Dispense Refill   apixaban (ELIQUIS) 5 MG TABS tablet Take 1 tablet (5 mg total) by mouth 2 (two) times daily. 60 tablet 6   buPROPion (WELLBUTRIN XL) 150 MG 24 hr tablet TAKE 1 TABLET BY MOUTH EVERY DAY 90 tablet 2   folic acid (FOLVITE) 1 MG tablet Take 1 tablet (1 mg total) by mouth daily. 30 tablet 3   glucosamine-chondroitin 500-400 MG  tablet Take 2 tablets by mouth every morning.     metoprolol succinate (TOPROL-XL) 50 MG 24 hr tablet Take 1 tablet (50 mg total) by mouth daily. 30 tablet 1   mometasone (ELOCON) 0.1 % cream Apply 1 application topically 2 (two) times daily as needed (irritation). Not for face or groin     Multiple Vitamin (MULTIVITAMIN WITH MINERALS) TABS tablet Take 1  tablet by mouth daily. 30 tablet 1   potassium chloride (KLOR-CON) 10 MEQ tablet Take 10 mEq by mouth daily.     pantoprazole (PROTONIX) 40 MG tablet Take 1 tablet (40 mg total) by mouth 2 (two) times daily before a meal. (Patient not taking: Reported on 03/31/2021) 60 tablet 1   No current facility-administered medications for this visit.    Allergies as of 03/31/2021   (No Known Allergies)    Family History  Problem Relation Age of Onset   Diabetes Mother    Diabetes Maternal Grandfather    Parkinson's disease Father    Ulcers Neg Hx    Colon cancer Neg Hx     Social History   Socioeconomic History   Marital status: Married    Spouse name: Not on file   Number of children: Not on file   Years of education: Not on file   Highest education level: Not on file  Occupational History   Occupation: [employer] Research officer, trade union  Tobacco Use   Smoking status: Every Day    Packs/day: 0.15    Types: Cigarettes    Start date: 10/04/2015   Smokeless tobacco: Never   Tobacco comments:    he would like to but he knows it is hard   Vaping Use   Vaping Use: Never used  Substance and Sexual Activity   Alcohol use: Yes    Alcohol/week: 4.0 standard drinks    Types: 4 Standard drinks or equivalent per week   Drug use: No   Sexual activity: Not on file  Other Topics Concern   Not on file  Social History Narrative   Not on file   Social Determinants of Health   Financial Resource Strain: Not on file  Food Insecurity: Not on file  Transportation Needs: Not on file  Physical Activity: Not on file  Stress: Not on file  Social Connections: Not on file    Subjective: Review of Systems  Constitutional:  Negative for chills and fever.  HENT:  Negative for congestion and hearing loss.   Eyes:  Negative for blurred vision and double vision.  Respiratory:  Negative for cough and shortness of breath.   Cardiovascular:  Negative for chest pain and palpitations.  Gastrointestinal:  Negative for  abdominal pain, blood in stool, constipation, diarrhea, heartburn, melena and vomiting.  Genitourinary:  Negative for dysuria and urgency.  Musculoskeletal:  Negative for joint pain and myalgias.  Skin:  Negative for itching and rash.  Neurological:  Negative for dizziness and headaches.  Psychiatric/Behavioral:  Negative for depression. The patient is not nervous/anxious.     Objective: BP 120/88    Pulse 84    Temp 97.6 F (36.4 C)    Ht 5\' 10"  (1.778 m)    Wt 193 lb 3.2 oz (87.6 kg)    BMI 27.72 kg/m  Physical Exam Constitutional:      Appearance: Normal appearance.  HENT:     Head: Normocephalic and atraumatic.  Eyes:     Extraocular Movements: Extraocular movements intact.     Conjunctiva/sclera: Conjunctivae normal.  Cardiovascular:  Rate and Rhythm: Normal rate and regular rhythm.  Pulmonary:     Effort: Pulmonary effort is normal.     Breath sounds: Normal breath sounds.  Abdominal:     General: Bowel sounds are normal.     Palpations: Abdomen is soft.  Musculoskeletal:        General: Normal range of motion.     Cervical back: Normal range of motion and neck supple.  Skin:    General: Skin is warm.  Neurological:     General: No focal deficit present.     Mental Status: He is alert and oriented to person, place, and time.  Psychiatric:        Mood and Affect: Mood normal.        Behavior: Behavior normal.     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  No complaints from a GI standpoint.  Continue on pantoprazole 40 mg daily.  Patient to call if he needs refills for this medication.  Congratulated patient on his alcohol cessation.  We will continue to monitor.  Has fatty liver disease on CT imaging likely due to chronic alcohol use.  LFTs most recently WNL.  Follow-up in 6 months or sooner if needed  03/31/2021 12:15 PM   Disclaimer: This  note was dictated with voice recognition software. Similar sounding words can inadvertently be transcribed and may not be corrected upon review.

## 2021-04-01 ENCOUNTER — Telehealth: Payer: Self-pay | Admitting: Family Medicine

## 2021-04-01 NOTE — Telephone Encounter (Signed)
Pt called and stated he does not need his oxygen anymore. He stated [employer] [facility] needs a release from the doctor before they pick it up.  [phone]

## 2021-04-03 NOTE — Telephone Encounter (Signed)
Nurses Was patient able to check a O2 saturation off of oxygen?(Normal O2 saturation off of oxygen would indicate to me he no longer needs the oxygen.  It would be beneficial to know this.  If he is unable to check an O2 saturation off of oxygen you could do a nurse visit with him to verify.  If his O2 saturation is normal off of oxygen or if he states they checked it at the house and it is normal then please go ahead and write out a order discontinuing the oxygen on a prescription pad and I will sign it and then it can be faxed thank you)

## 2021-04-04 NOTE — Telephone Encounter (Signed)
Patient states he does not have a way to check his oxygen saturation but will just keep the oxygen till his appt on Feb !4 with Dr Nicki Reaper and we can check to see if he still needs it- he will call back if he wants a sooner appt to check it.

## 2021-04-06 ENCOUNTER — Other Ambulatory Visit: Payer: Self-pay

## 2021-04-06 ENCOUNTER — Ambulatory Visit (INDEPENDENT_AMBULATORY_CARE_PROVIDER_SITE_OTHER): Payer: Medicare Other | Admitting: Urology

## 2021-04-06 ENCOUNTER — Encounter: Payer: Self-pay | Admitting: Urology

## 2021-04-06 VITALS — BP 126/86 | HR 76

## 2021-04-06 DIAGNOSIS — N401 Enlarged prostate with lower urinary tract symptoms: Secondary | ICD-10-CM | POA: Diagnosis not present

## 2021-04-06 DIAGNOSIS — R3129 Other microscopic hematuria: Secondary | ICD-10-CM

## 2021-04-06 DIAGNOSIS — N138 Other obstructive and reflux uropathy: Secondary | ICD-10-CM | POA: Diagnosis not present

## 2021-04-06 LAB — URINALYSIS, ROUTINE W REFLEX MICROSCOPIC
Bilirubin, UA: NEGATIVE
Glucose, UA: NEGATIVE
Ketones, UA: NEGATIVE
Leukocytes,UA: NEGATIVE
Nitrite, UA: NEGATIVE
Protein,UA: NEGATIVE
RBC, UA: NEGATIVE
Specific Gravity, UA: 1.02 (ref 1.005–1.030)
Urobilinogen, Ur: 0.2 mg/dL (ref 0.2–1.0)
pH, UA: 6 (ref 5.0–7.5)

## 2021-04-06 LAB — BLADDER SCAN AMB NON-IMAGING: Scan Result: 5

## 2021-04-06 NOTE — Progress Notes (Signed)
Urological Symptom Review  Patient is experiencing the following symptoms: Frequent urination Hard to postpone urination Get up at night to urinate Stream starts and stops Trouble starting stream   Review of Systems  Gastrointestinal (upper)  : Negative for upper GI symptoms  Gastrointestinal (lower) : Negative for lower GI symptoms  Constitutional : Fatigue  Skin: Negative for skin symptoms  Eyes: Negative for eye symptoms  Ear/Nose/Throat : Negative for Ear/Nose/Throat symptoms  Hematologic/Lymphatic: Easy bruising  Cardiovascular : Negative for cardiovascular symptoms  Respiratory : Negative for respiratory symptoms  Endocrine: Negative for endocrine symptoms  Musculoskeletal: Negative for musculoskeletal symptoms  Neurological: Negative for neurological symptoms  Psychologic: Depression Anxiety

## 2021-04-06 NOTE — Progress Notes (Signed)
post void residual   5 ml

## 2021-04-06 NOTE — Progress Notes (Signed)
Assessment: 1. Microscopic hematuria   2. BPH with obstruction/lower urinary tract symptoms     Plan: Today I had a discussion with the patient regarding the findings of microscopic hematuria including the implications and differential diagnoses associated with it.  I also discussed recommendations for further evaluation including the rationale for cystoscopy.  I discussed the nature of these procedures including potential risk and complications.  The patient expressed an understanding of these issues. His urinalysis showed no evidence of hematuria 1 month ago and is clear today. Recommend follow-up for repeat urinalysis in 4-6 weeks. He would like to continue to monitor his lower urinary tract symptoms at this time.  Chief Complaint:  Chief Complaint  Patient presents with   Hematuria    History of Present Illness:  Anthony Little is a 71 y.o. year old male who is seen in consultation from Kathyrn Drown, MD for evaluation of microscopic hematuria.  The patient denies gross hematuria.  He does complain of longstanding urinary frequency up to 8 voids per day.  He also complains of urgency, weakness of stream and straining to void, especially first thing in the morning.  He voids 2 times per night.  No history of UTIs and no history of urinary stones.  He has a complicated medical history and is currently on Eliquis for new onset A. fib diagnosed 3 months ago.  He reports no abnormal bleeding since starting Eliquis.  He is a longtime smoker and states he smoked approximately 2 packs/day for 45 years, but cut down to 4 to 5 cigarettes/day over the last 5 years.  Complaints of back pain prompted CT of abdomen and pelvis with contrast on November 18, 2021.  PSA levels reviewed and followed by Dr. Wolfgang Phoenix.   Urinalysis results: 02/17/2021 21-50 RBCs Urine culture: no growth 03/08/2021 Negative  CT abdomen and pelvis with contrast from 11/18/2020 showed bilateral renal cortical thinning, no  hydronephrosis or renal mass.  IPPS- 73, QOL- 3 PRV-61ml   Past Medical History:  Past Medical History:  Diagnosis Date   Abdominal aortic aneurysm (AAA)    Atrial fibrillation and flutter (HCC)    CKD (chronic kidney disease) stage 3, GFR 30-59 ml/min (HCC)    CVA (cerebral infarction)    Essential hypertension    Hyperlipidemia    Thoracic aortic aneurysm     Past Surgical History:  Past Surgical History:  Procedure Laterality Date   BIOPSY  08/17/2020   Procedure: BIOPSY;  Surgeon: Eloise Harman, DO;  [facility];  Service: Endoscopy;;   BIOPSY  02/21/2021   Procedure: BIOPSY;  Surgeon: Eloise Harman, DO;  [facility];  Service: Endoscopy;;   COLONOSCOPY  12/2009   Diverticulosis, mild internal hemorrhoids, multiple colon polyps removed (adenomatous). One irregular shaped polyp was tattooed. Next colonoscopy due October 2016.   COLONOSCOPY N/A 11/01/2015   Procedure: COLONOSCOPY;  Surgeon: Danie Binder, MD;  [facility];  Service: Endoscopy;  Laterality: N/A;  100   COLONOSCOPY WITH PROPOFOL N/A 08/17/2020   Procedure: COLONOSCOPY WITH PROPOFOL;  Surgeon: Eloise Harman, DO;  [facility];  Service: Endoscopy;  Laterality: N/A;  2:15pm   ESOPHAGOGASTRODUODENOSCOPY N/A 10/08/2012   SLF: Moderate erosive gastritis/duodenitis. Biopsies benign.   ESOPHAGOGASTRODUODENOSCOPY N/A 11/01/2015   Procedure: ESOPHAGOGASTRODUODENOSCOPY (EGD);  Surgeon: Danie Binder, MD;  [facility];  Service: Endoscopy;  Laterality: N/A;   ESOPHAGOGASTRODUODENOSCOPY (EGD) WITH PROPOFOL N/A 02/21/2021   Procedure:  ESOPHAGOGASTRODUODENOSCOPY (EGD) WITH PROPOFOL;  Surgeon: Eloise Harman, DO;  [facility];  Service: Endoscopy;  Laterality: N/A;   POLYPECTOMY  08/17/2020   Procedure: POLYPECTOMY;  Surgeon: Eloise Harman, DO;  [facility];  Service: Endoscopy;;    Allergies:  No Known Allergies  Family  History:  Family History  Problem Relation Age of Onset   Diabetes Mother    Diabetes Maternal Grandfather    Parkinson's disease Father    Ulcers Neg Hx    Colon cancer Neg Hx     Social History:  Social History   Tobacco Use   Smoking status: Every Day    Packs/day: 0.15    Types: Cigarettes    Start date: 10/04/2015   Smokeless tobacco: Never   Tobacco comments:    he would like to but he knows it is hard   Vaping Use   Vaping Use: Never used  Substance Use Topics   Alcohol use: Yes    Alcohol/week: 4.0 standard drinks    Types: 4 Standard drinks or equivalent per week   Drug use: No    Review of symptoms:  Constitutional:  Negative for unexplained weight loss, night sweats, fever, chills ENT:  Negative for nose bleeds, sinus pain, painful swallowing CV:  Negative for chest pain,palpitations, loss of consciousness Resp:  Negative for cough, wheezing, shortness of breath GI:  Negative for nausea, vomiting, diarrhea, bloody stools GU:  Positives noted in HPI; otherwise negative for gross hematuria, dysuria, urinary incontinence Neuro:  Negative for seizures, poor balance, limb weakness, slurred speech Psych:  Negative for lack of energy Endocrine:  Negative for polydipsia, polyuria, symptoms of hypoglycemia (dizziness, hunger, sweating) Hematologic:  Negative for anemia, purpura, petechia, prolonged or excessive bleeding Allergic:  Negative for difficulty breathing or choking as a result of exposure to anything; no shellfish allergy; no allergic response (rash/itch) to materials, foods  Physical exam: BP 126/86    Pulse 76  GENERAL APPEARANCE:  Well developed, well nourished, NAD HEENT: Atraumatic, Normocephalic, oropharynx clear. NECK: Supple without lymphadenopathy or thyromegaly. LUNGS: Clear to auscultation bilaterally. HEART: Regular Rate and Rhythm without murmurs, gallops, or rubs. ABDOMEN: Soft, non-tender, No Masses. EXTREMITIES: Moves all extremities well.   Without clubbing, or edema.  Both hands appear dusky and cyanotic. NEUROLOGIC:  Alert and oriented x 3, normal gait, CN II-XII grossly intact.  MENTAL STATUS:  Appropriate. BACK:  Non-tender to palpation.  No CVAT SKIN:  Warm, dry and intact.  GU: Penis:  uncircumcised Meatus: Normal Scrotum: normal, no masses Testis: normal without masses bilateral Prostate: 40 gm ,NT, no nodules Rectum: Normal tone,  no masses or tenderness    Results: U/A:  dipstick negative

## 2021-04-12 DIAGNOSIS — N289 Disorder of kidney and ureter, unspecified: Secondary | ICD-10-CM | POA: Diagnosis not present

## 2021-04-12 DIAGNOSIS — R319 Hematuria, unspecified: Secondary | ICD-10-CM | POA: Diagnosis not present

## 2021-04-12 DIAGNOSIS — I10 Essential (primary) hypertension: Secondary | ICD-10-CM | POA: Diagnosis not present

## 2021-04-12 DIAGNOSIS — D692 Other nonthrombocytopenic purpura: Secondary | ICD-10-CM | POA: Diagnosis not present

## 2021-04-13 LAB — BASIC METABOLIC PANEL
BUN/Creatinine Ratio: 13 (ref 10–24)
BUN: 21 mg/dL (ref 8–27)
CO2: 20 mmol/L (ref 20–29)
Calcium: 8.8 mg/dL (ref 8.6–10.2)
Chloride: 102 mmol/L (ref 96–106)
Creatinine, Ser: 1.68 mg/dL — ABNORMAL HIGH (ref 0.76–1.27)
Glucose: 88 mg/dL (ref 70–99)
Potassium: 4.9 mmol/L (ref 3.5–5.2)
Sodium: 136 mmol/L (ref 134–144)
eGFR: 44 mL/min/{1.73_m2} — ABNORMAL LOW (ref >59)

## 2021-04-13 LAB — PSA: Prostate Specific Ag, Serum: 0.5 ng/mL (ref 0.0–4.0)

## 2021-04-15 ENCOUNTER — Ambulatory Visit: Payer: Medicare Other | Admitting: Cardiology

## 2021-04-15 ENCOUNTER — Telehealth: Payer: Self-pay | Admitting: Cardiology

## 2021-04-15 ENCOUNTER — Encounter: Payer: Self-pay | Admitting: Cardiology

## 2021-04-15 ENCOUNTER — Other Ambulatory Visit: Payer: Self-pay

## 2021-04-15 VITALS — BP 114/70 | HR 72 | Ht 70.0 in | Wt 200.0 lb

## 2021-04-15 DIAGNOSIS — Z01812 Encounter for preprocedural laboratory examination: Secondary | ICD-10-CM

## 2021-04-15 DIAGNOSIS — I4891 Unspecified atrial fibrillation: Secondary | ICD-10-CM

## 2021-04-15 DIAGNOSIS — I7121 Aneurysm of the ascending aorta, without rupture: Secondary | ICD-10-CM | POA: Diagnosis not present

## 2021-04-15 DIAGNOSIS — I10 Essential (primary) hypertension: Secondary | ICD-10-CM | POA: Diagnosis not present

## 2021-04-15 DIAGNOSIS — I4892 Unspecified atrial flutter: Secondary | ICD-10-CM | POA: Diagnosis not present

## 2021-04-15 NOTE — Progress Notes (Signed)
Cardiology Office Note  Date: 04/15/2021   ID: Anthony Little, DOB 02/11/1951, MRN [id number]  PCP:  Kathyrn Drown, MD  Cardiologist:  Rozann Lesches, MD Electrophysiologist:  None   Chief Complaint  Patient presents with   Cardiac follow-up    History of Present Illness: Anthony Little is a 71 y.o. male last seen in August 2022 by Mr. Leonides Sake NP.  He is here for a follow-up visit.  He does not describe any sense of palpitations, no exertional dyspnea beyond NYHA class II.  I reviewed his medications which are noted below.  He continues on Toprol-XL for heart rate control and Eliquis for stroke prophylaxis.  He does not report any spontaneous bleeding problems.  I reviewed his recent lab work.  He is due for follow-up chest CTA in April of this year for follow-up of thoracic aortic aneurysm measuring 3.5 cm in the proximal descending segment.  He is asymptomatic.  He states that he stopped drinking, feels comfortable with this decision and is noted that his blood pressure has trended down.  He has follow-up scheduled soon with Dr. Wolfgang Phoenix.  Past Medical History:  Diagnosis Date   Abdominal aortic aneurysm (AAA)    Atrial fibrillation and flutter (HCC)    CKD (chronic kidney disease) stage 3, GFR 30-59 ml/min (HCC)    CVA (cerebral infarction)    Essential hypertension    Hyperlipidemia    Thoracic aortic aneurysm     Past Surgical History:  Procedure Laterality Date   BIOPSY  08/17/2020   Procedure: BIOPSY;  Surgeon: Eloise Harman, DO;  [facility];  Service: Endoscopy;;   BIOPSY  02/21/2021   Procedure: BIOPSY;  Surgeon: Eloise Harman, DO;  [facility];  Service: Endoscopy;;   COLONOSCOPY  12/2009   Diverticulosis, mild internal hemorrhoids, multiple colon polyps removed (adenomatous). One irregular shaped polyp was tattooed. Next colonoscopy due October 2016.   COLONOSCOPY N/A 11/01/2015   Procedure: COLONOSCOPY;  Surgeon: Danie Binder, MD;  [facility];  Service: Endoscopy;  Laterality: N/A;  100   COLONOSCOPY WITH PROPOFOL N/A 08/17/2020   Procedure: COLONOSCOPY WITH PROPOFOL;  Surgeon: Eloise Harman, DO;  [facility];  Service: Endoscopy;  Laterality: N/A;  2:15pm   ESOPHAGOGASTRODUODENOSCOPY N/A 10/08/2012   SLF: Moderate erosive gastritis/duodenitis. Biopsies benign.   ESOPHAGOGASTRODUODENOSCOPY N/A 11/01/2015   Procedure: ESOPHAGOGASTRODUODENOSCOPY (EGD);  Surgeon: Danie Binder, MD;  [facility];  Service: Endoscopy;  Laterality: N/A;   ESOPHAGOGASTRODUODENOSCOPY (EGD) WITH PROPOFOL N/A 02/21/2021   Procedure: ESOPHAGOGASTRODUODENOSCOPY (EGD) WITH PROPOFOL;  Surgeon: Eloise Harman, DO;  [facility];  Service: Endoscopy;  Laterality: N/A;   POLYPECTOMY  08/17/2020   Procedure: POLYPECTOMY;  Surgeon: Eloise Harman, DO;  [facility];  Service: Endoscopy;;    Current Outpatient Medications  Medication Sig Dispense Refill   apixaban (ELIQUIS) 5 MG TABS tablet Take 1 tablet (5 mg total) by mouth 2 (two) times daily. 60 tablet 6   buPROPion (WELLBUTRIN XL) 150 MG 24 hr tablet TAKE 1 TABLET BY MOUTH EVERY DAY 90 tablet 2   folic acid (FOLVITE) 1 MG tablet Take 1 tablet (1 mg total) by mouth daily. 30 tablet 3   glucosamine-chondroitin 500-400 MG tablet Take 2 tablets by mouth every morning.     metoprolol succinate (TOPROL-XL) 50 MG 24 hr tablet Take 1 tablet (50 mg total) by mouth daily. 30 tablet  1   mometasone (ELOCON) 0.1 % cream Apply 1 application topically 2 (two) times daily as needed (irritation). Not for face or groin     Multiple Vitamin (MULTIVITAMIN WITH MINERALS) TABS tablet Take 1 tablet by mouth daily. 30 tablet 1   pantoprazole (PROTONIX) 40 MG tablet Take 1 tablet (40 mg total) by mouth 2 (two) times daily before a meal. 60 tablet 1   potassium chloride (KLOR-CON) 10 MEQ tablet Take 10 mEq by mouth daily.     No current  facility-administered medications for this visit.   Allergies:  Patient has no known allergies.   ROS: Raynaud's phenomenon.  Physical Exam: VS:  BP 114/70    Pulse 72    Ht 5\' 10"  (1.778 m)    Wt 200 lb (90.7 kg)    SpO2 97%    BMI 28.70 kg/m , BMI Body mass index is 28.7 kg/m.  Wt Readings from Last 3 Encounters:  04/15/21 200 lb (90.7 kg)  03/31/21 193 lb 3.2 oz (87.6 kg)  03/15/21 187 lb 12.8 oz (85.2 kg)    General: Patient appears comfortable at rest. HEENT: Conjunctiva and lids normal, wearing a mask. Neck: Supple, no elevated JVP or carotid bruits, no thyromegaly. Lungs: Clear to auscultation, nonlabored breathing at rest. Cardiac: Irregularly irregular, no S3, 1/6 systolic murmur, no pericardial rub. Extremities: No pitting edema.  ECG:  An ECG dated 02/16/2021 was personally reviewed today and demonstrated:  Atrial fibrillation with RVR, nonspecific ST-T changes  Recent Labwork: 02/16/2021: B Natriuretic Peptide 252.0 02/18/2021: Magnesium 2.2 03/08/2021: ALT 19; AST 26; Hemoglobin 11.9; Platelets 250 04/12/2021: BUN 21; Creatinine, Ser 1.68; Potassium 4.9; Sodium 136     Component Value Date/Time   CHOL 173 10/13/2020 1437   TRIG 61 10/13/2020 1437   HDL 97 10/13/2020 1437   CHOLHDL 1.8 10/13/2020 1437   CHOLHDL 2.4 07/30/2012 1215   VLDL 11 07/30/2012 1215   LDLCALC 64 10/13/2020 1437    Other Studies Reviewed Today:  Chest CTA 07/05/2020: IMPRESSION: 1. Mild thoracic aortic aneurysm measuring 3.4 cm in the arch, 3.5 cm in the proximal descending segment. Recommend annual imaging followup by CTA or MRA. This recommendation follows 2010 ACCF/AHA/AATS/ACR/ASA/SCA/SCAI/SIR/STS/SVM Guidelines for the Diagnosis and Management of Patients with Thoracic Aortic Disease. Circulation.2010; 121: E081-K481. Aortic aneurysm NOS (ICD10-I71.9)   Aortic Atherosclerosis (ICD10-I70.0) and Emphysema (ICD10-J43.9).  Echocardiogram 07/29/2020:  1. Left ventricular ejection  fraction, by estimation, is 60 to 65%. The  left ventricle has normal function. The left ventricle has no regional  wall motion abnormalities. Left ventricular diastolic parameters were  normal.   2. Right ventricular systolic function is normal. The right ventricular  size is normal.   3. Left atrial size was moderately dilated.   4. The mitral valve is normal in structure. Mild mitral valve  regurgitation. No evidence of mitral stenosis.   5. The aortic valve has an indeterminant number of cusps. There is mild  calcification of the aortic valve. There is mild thickening of the aortic  valve. Aortic valve regurgitation is not visualized. No aortic stenosis is  present.   6. The inferior vena cava is normal in size with greater than 50%  respiratory variability, suggesting right atrial pressure of 3 mmHg.   Assessment and Plan:  1.  Persistent atrial fibrillation/flutter with CHA2DS2-VASc score of 5.  Heart rate control is adequate on current dose of Toprol-XL and he remains on Eliquis for stroke prophylaxis.  He does not describe any spontaneous bleeding  problems, I reviewed his recent lab work.  No changes were made today.  2.  Asymptomatic thoracic aortic aneurysm.  He is due for follow-up chest CTA in April of this year which will be arranged.  Descending segment of thoracic aorta was 3.5 cm last year.  3.  CKD stage IIIb, creatinine 1.68.  4.  Raynaud's phenomenon.  Discussed wearing gloves in the cold weather.  Medication Adjustments/Labs and Tests Ordered: Current medicines are reviewed at length with the patient today.  Concerns regarding medicines are outlined above.   Tests Ordered: Orders Placed This Encounter  Procedures   CT ANGIO CHEST AORTA W/CM & OR WO/CM   Basic metabolic panel    Medication Changes: No orders of the defined types were placed in this encounter.   Disposition:  Follow up  6 months.  Signed, Satira Sark, MD, [facility] 04/15/2021 [facility] [place], [place], [place] [address] Phone: [phone]; Fax: [phone]

## 2021-04-15 NOTE — Patient Instructions (Addendum)
Medication Instructions:  Continue all current medications.  Labwork: BMET - order given today Please do just prior to CT  Testing/Procedures: Chest CTA with contrast - due April  Office will contact with results via phone or letter.     Follow-Up: 6 months   Any Other Special Instructions Will Be Listed Below (If Applicable).   If you need a refill on your cardiac medications before your next appointment, please call your pharmacy.

## 2021-04-15 NOTE — Telephone Encounter (Signed)
Checking percert on the following patient for testing scheduled at [facility].    CT ANGIO CHEST AORTA W/CM &/OR   06/15/2021

## 2021-04-26 ENCOUNTER — Other Ambulatory Visit: Payer: Self-pay

## 2021-04-26 ENCOUNTER — Ambulatory Visit (INDEPENDENT_AMBULATORY_CARE_PROVIDER_SITE_OTHER): Payer: Medicare Other | Admitting: Family Medicine

## 2021-04-26 VITALS — BP 114/79 | HR 71 | Temp 98.1°F | Ht 70.0 in | Wt 206.4 lb

## 2021-04-26 DIAGNOSIS — I700 Atherosclerosis of aorta: Secondary | ICD-10-CM

## 2021-04-26 DIAGNOSIS — E785 Hyperlipidemia, unspecified: Secondary | ICD-10-CM | POA: Diagnosis not present

## 2021-04-26 DIAGNOSIS — I25119 Atherosclerotic heart disease of native coronary artery with unspecified angina pectoris: Secondary | ICD-10-CM | POA: Diagnosis not present

## 2021-04-26 DIAGNOSIS — G4700 Insomnia, unspecified: Secondary | ICD-10-CM | POA: Diagnosis not present

## 2021-04-26 DIAGNOSIS — F321 Major depressive disorder, single episode, moderate: Secondary | ICD-10-CM

## 2021-04-26 DIAGNOSIS — I10 Essential (primary) hypertension: Secondary | ICD-10-CM | POA: Diagnosis not present

## 2021-04-26 MED ORDER — DOXEPIN HCL 10 MG PO CAPS: 10.0000 mg | ORAL_CAPSULE | Freq: Every day | ORAL | 5 refills | Status: DC

## 2021-04-26 NOTE — Progress Notes (Signed)
° °  Subjective:    Patient ID: Anthony Little, male    DOB: 12-Oct-1950, 71 y.o.   MRN: [id number]  HPI Patient here for 6 week follow up.  Blood pressures have been good.  Has underlying chronic kidney disease as well as heart disease as well as thoracic aorta atherosclerosis taking his cholesterol medicine Will be doing a follow-up imaging for aortic aneurysm of the thoracic region Lately having a lot of trouble sleeping. Got about 2 hours sleep last night.  Patient relates most of the time has hard time either falling asleep or staying asleep.  Only sleeping a few hours per night He still smokes a few cigarettes a day does not drink alcohol.  Denies headaches.  Review of Systems     Objective:   Physical Exam General-in no acute distress Eyes-no discharge Lungs-respiratory rate normal, CTA CV-no murmurs,RRR Extremities skin warm dry no edema Neuro grossly normal Behavior normal, alert Raynauds disease stable.  Discussed in detail the various findings of this and the ways to try to minimize it       Assessment & Plan:  1. Hyperlipidemia, unspecified hyperlipidemia type Patient will continue medication check labs before next visit.  He states he will send [place] information regarding the urologist the blood work that they were going to be doing so that we do not duplicate lab work - Lipid Profile - Basic Metabolic Panel (BMET)  2. Essential hypertension Blood pressure good control continue current measures.  Stop Wellbutrin Start doxepin to see if this helps Side effects discussed if it causes too much drowsiness to let us know patient to follow-up in 3 months patient to give [place] feedback in a couple weeks how this is doing  - Lipid Profile - Basic Metabolic Panel (BMET)  3. Insomnia, unspecified type Doxepin as discussed.  Also heart disease stable on good medications with cardiology Depression doing very well currently.  Hopefully no negative occurrence with medication  switch follow-up in 3 months Patient looking at getting a part-time job which would be helpful form Follow-up imaging CAT scan of the thoracic aorta ordered by cardiology

## 2021-04-26 NOTE — Patient Instructions (Addendum)
Stop Wellbutrin Start Doxepin ( Sinequan) 10 mg each night should help with sleep      Shingrix and shingles prevention: know the facts!   Shingrix is a very effective vaccine to prevent shingles.   Shingles is a reactivation of chickenpox -more than 99% of Americans born before 1980 have had chickenpox even if they do not remember it. One in every 10 people who get shingles have severe long-lasting nerve pain as a result.   33 out of a 100 older adults will get shingles if they are unvaccinated.     This vaccine is very important for your health This vaccine is indicated for anyone 50 years or older. You can get this vaccine even if you have already had shingles because you can get the disease more than once in a lifetime.  Your risk for shingles and its complications increases with age.  This vaccine has 2 doses.  The second dose would be 2 to 6 months after the first dose.  If you had Zostavax vaccine in the past you should still get Shingrix. ( Zostavax is only 70% effective and it loses significant strength over a few years .)  This vaccine is given through the pharmacy.  The cost of the vaccine is through your insurance. The pharmacy can inform you of the total costs.  Common side effects including soreness in the arm, some redness and swelling, also some feel fatigue muscle soreness headache low-grade fever.  Side effects typically go away within 2 to 3 days. Remember-the pain from shingles can last a lifetime but these side effects of the vaccine will only last a few days at most. It is very important to get both doses in order to protect yourself fully.   Please get this vaccine at your earliest convenience at your trusted pharmacy.

## 2021-04-27 DIAGNOSIS — I4892 Unspecified atrial flutter: Secondary | ICD-10-CM | POA: Diagnosis not present

## 2021-04-27 DIAGNOSIS — R1310 Dysphagia, unspecified: Secondary | ICD-10-CM | POA: Diagnosis not present

## 2021-04-27 DIAGNOSIS — R112 Nausea with vomiting, unspecified: Secondary | ICD-10-CM | POA: Diagnosis not present

## 2021-04-28 ENCOUNTER — Encounter: Payer: Self-pay | Admitting: Family Medicine

## 2021-04-29 NOTE — Telephone Encounter (Signed)
Nurses I reviewed over the lab work which has been ordered previously The lab work that Dr. Domenic Polite ordered to be done approximately 1 week before his CAT scan in April is so that the kidney function can be checked before undergoing a CAT scan  The blood work that we ordered to look at repeat of kidney function and cholesterol can be done at the patient's convenience over the course of the next couple weeks and allow [place] to recheck kidney function as well as to get up-to-date info regarding cholesterol profile

## 2021-05-08 IMAGING — CT CT ANGIO CHEST
2 of 6 series · 16 of 36 positions shown · IV contrast (Omnipaque or Isovue)
Comparison: 07/09/2019

CLINICAL DATA: Hypertension, aortic aneurysm

EXAM:
CT ANGIOGRAPHY CHEST WITH CONTRAST
TECHNIQUE: Multidetector CT imaging of the chest was performed using the
standard protocol during bolus administration of intravenous
contrast. Multiplanar CT image reconstructions and MIPs were
obtained to evaluate the vascular anatomy.
CONTRAST:  75mL OMNIPAQUE IOHEXOL 350 MG/ML SOLN

[Series 7: lungs · axial · 0.73mm/px · z∈[+38,+342]mm · 15 of 174 slices shown]
[im 11/174  lung]
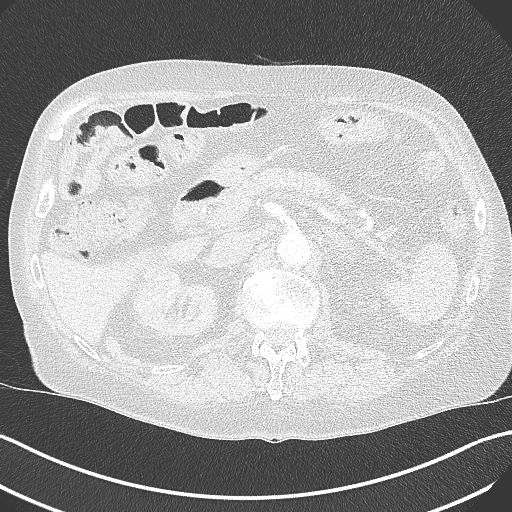
[im 22/174  mediastinal]
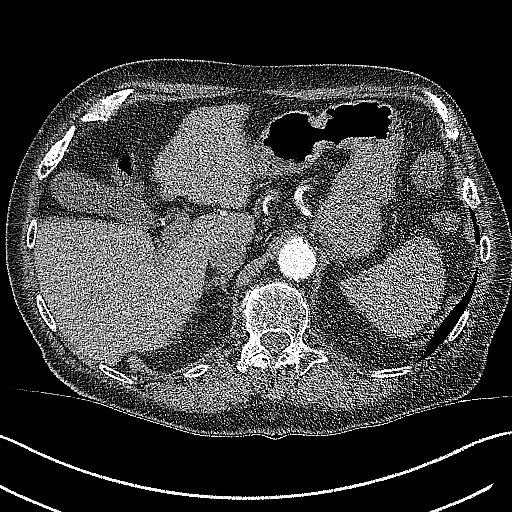
[im 33/174  lung]
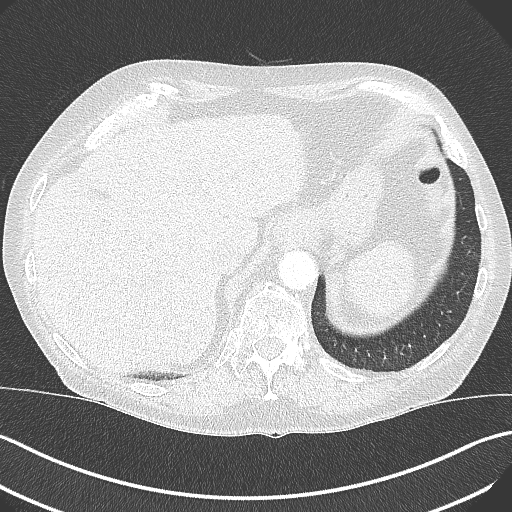
[im 44/174  mediastinal]
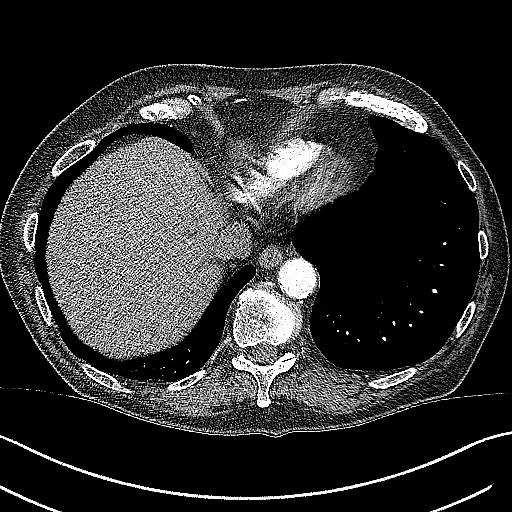
[im 55/174  lung]
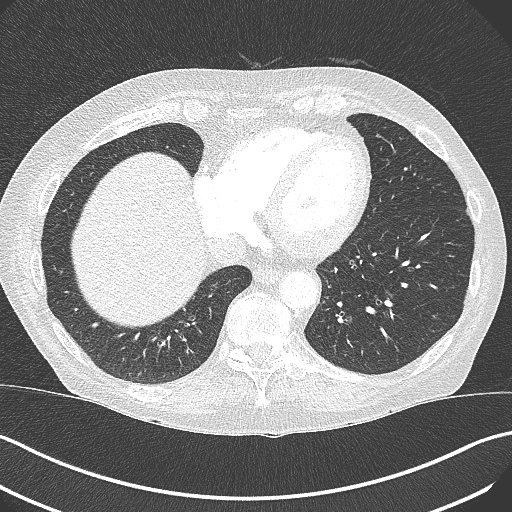
[im 65/174  mediastinal]
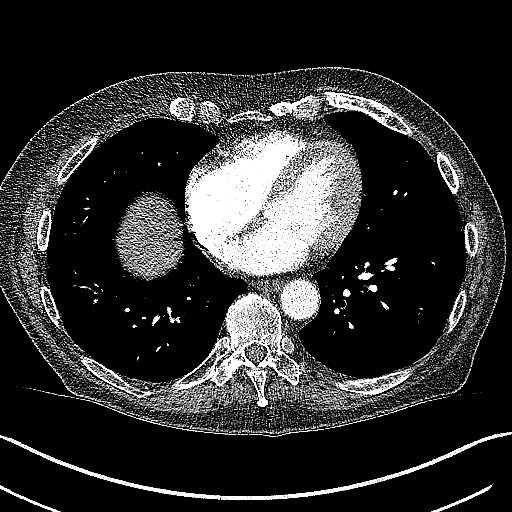
[im 76/174  lung]
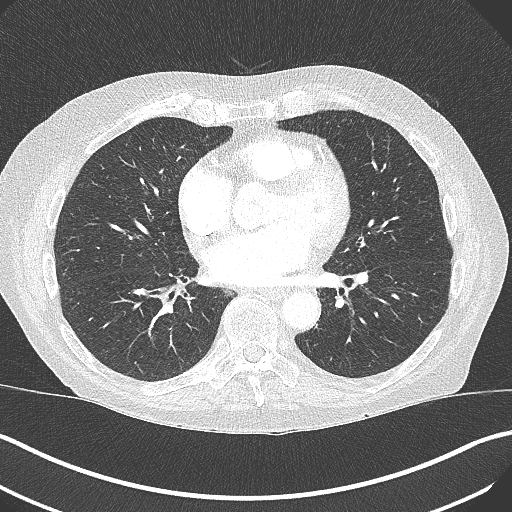
[im 87/174  mediastinal]
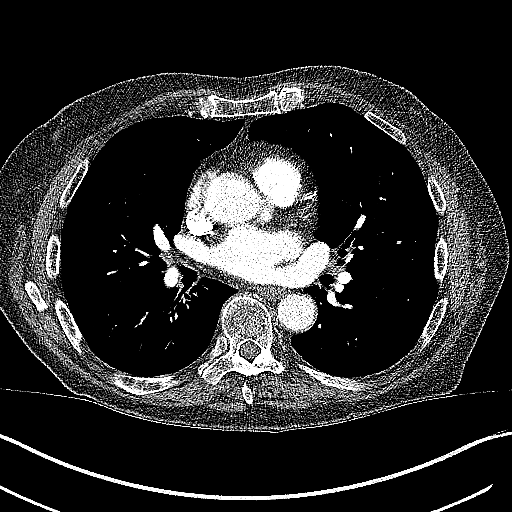
[im 98/174  lung]
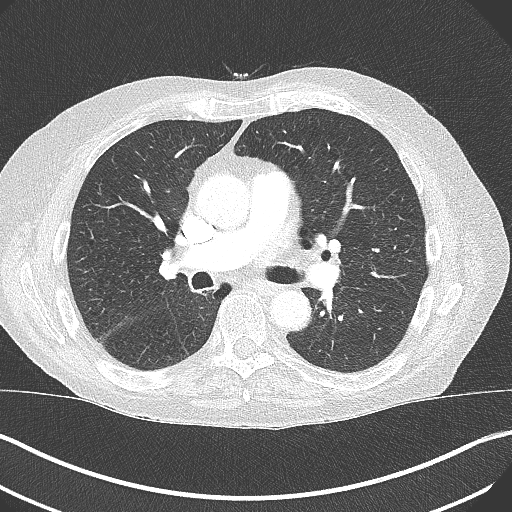
[im 109/174  mediastinal]
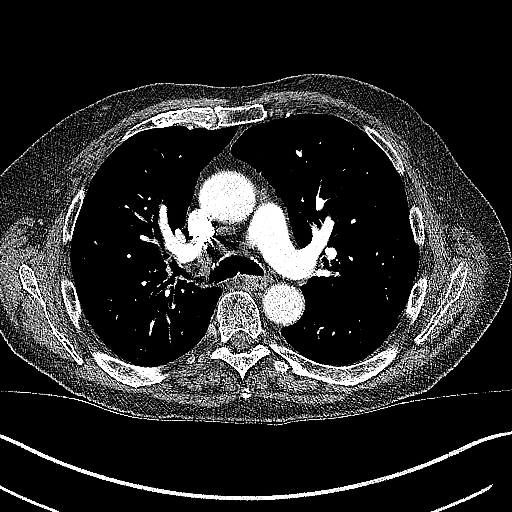
[im 119/174  lung]
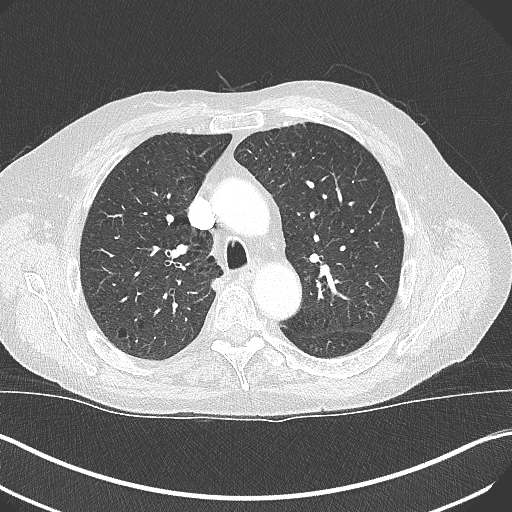
[im 130/174  mediastinal]
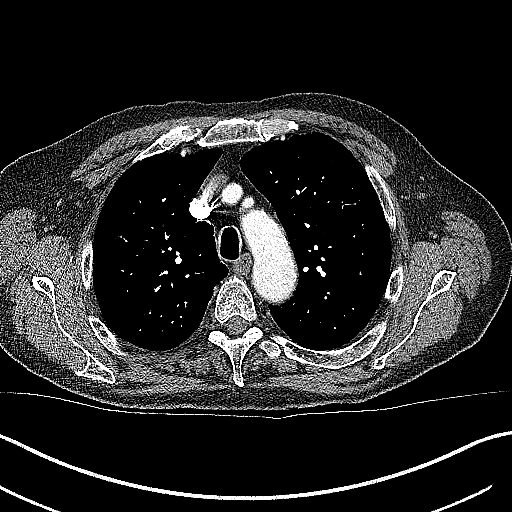
[im 141/174  lung]
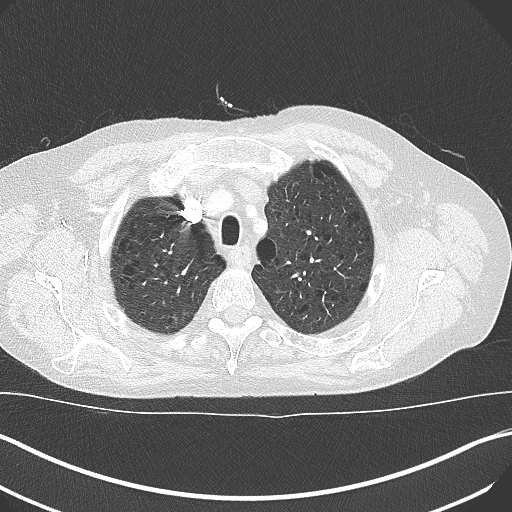
[im 152/174  mediastinal]
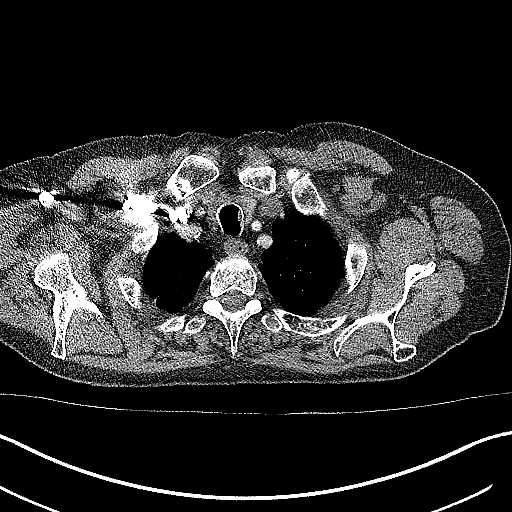
[im 163/174  lung]
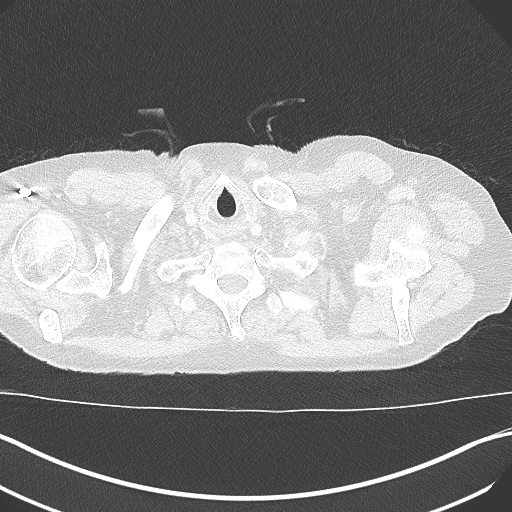

[Series 8: cor soft · coronal · 0.71mm/px · 1 of 152 slices shown]
[im 76/152  mediastinal]
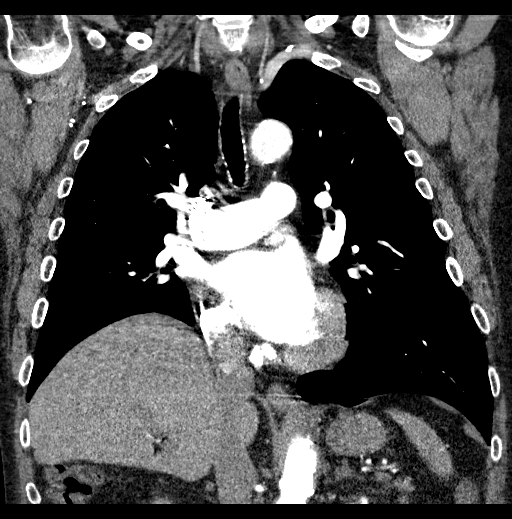

[16 of 36 positions shown; findings below may reference images not displayed]

FINDINGS: Cardiovascular: Size normal. No pericardial effusion. Satisfactory
opacification of pulmonary arteries noted, and there is no evidence
of pulmonary emboli. Scattered coronary calcifications. Adequate
contrast opacification of the thoracic aorta with no evidence of
dissection, or stenosis. There is classic 3-vessel brachiocephalic
arch anatomy without proximal stenosis.

Aortic Root:

--Valve: 2.7 cm

--Sinuses: 3.6 cm

--Sinotubular Junction: 2.4 cm

Limitations by motion: Moderate

Thoracic Aorta:

--Ascending Aorta: 3.7 cm

--Aortic Arch: 3.4 cm

--Descending Aorta: 3.5 cm proximally, tapering to 2.6 cm above the
diaphragm

The visualized proximal abdominal aorta is atheromatous, normal in
caliber.

Mediastinum/Nodes: No mass or adenopathy

Lungs/Pleura: No pleural effusion. Pulmonary emphysema with
subpleural blebs in both upper lobes. 6 mm noncalcified nodule,
superior segment left lower lobe (BmDGN,Se7) , stable since
03/03/2016. No new nodule or infiltrate.

Upper Abdomen: Left renal atrophy.  No acute findings.

Musculoskeletal: No chest wall abnormality. No acute or significant
osseous findings.

Review of the MIP images confirms the above findings.
IMPRESSION: 1. Mild thoracic aortic aneurysm measuring 3.4 cm in the arch,
cm in the proximal descending segment. Recommend annual imaging
followup by CTA or MRA. This recommendation follows 3898
ACCF/AHA/AATS/ACR/ASA/SCA/GANEM/ALEJTIN/STELIQN/JANNU Guidelines for the
Diagnosis and Management of Patients with Thoracic Aortic Disease.
Circulation.3898; 121: E266-e369. Aortic aneurysm NOS (LQJ8Q-KX4.0)

Aortic Atherosclerosis (LQJ8Q-QYT.T) and Emphysema (LQJ8Q-UYE.1).

## 2021-05-10 ENCOUNTER — Telehealth: Payer: Self-pay

## 2021-05-10 ENCOUNTER — Other Ambulatory Visit: Payer: Self-pay

## 2021-05-10 NOTE — Telephone Encounter (Signed)
It would be fine to go ahead and give an order to discontinue the oxygen if this needs to be a signed order please write it out of the prescription pad and I will sign the prescription thank you

## 2021-05-10 NOTE — Telephone Encounter (Signed)
Patient calls and states he no longer needs the oxygen and oxygen equipment delivered by [facility], and that they require an order for them to pick these items up . Please advise  Ph # - [phone]  fax # [phone]

## 2021-05-11 NOTE — Telephone Encounter (Signed)
Script written out and placed on provider door for signature. Will fax once signed. Please advise. Thank you ?

## 2021-05-12 NOTE — Telephone Encounter (Signed)
I did sign the prescription thank you very much it is on my door ?

## 2021-05-13 NOTE — Telephone Encounter (Signed)
Prescription faxed to number provided.

## 2021-05-18 ENCOUNTER — Ambulatory Visit: Payer: Medicare Other | Admitting: Urology

## 2021-05-18 ENCOUNTER — Other Ambulatory Visit: Payer: Self-pay | Admitting: Family Medicine

## 2021-05-19 NOTE — Telephone Encounter (Signed)
May have 90-day with 1 refill ?

## 2021-06-11 ENCOUNTER — Other Ambulatory Visit: Payer: Self-pay | Admitting: Family Medicine

## 2021-06-11 DIAGNOSIS — I4821 Permanent atrial fibrillation: Secondary | ICD-10-CM

## 2021-06-15 ENCOUNTER — Other Ambulatory Visit: Payer: Medicare Other

## 2021-06-15 ENCOUNTER — Telehealth: Payer: Self-pay | Admitting: Family Medicine

## 2021-06-15 ENCOUNTER — Ambulatory Visit (HOSPITAL_COMMUNITY)
Admission: RE | Admit: 2021-06-15 | Discharge: 2021-06-15 | Disposition: A | Discharge status: Home / Self Care | Payer: Medicare Other | Source: Ambulatory Visit | Attending: Cardiology | Admitting: Cardiology

## 2021-06-15 DIAGNOSIS — I7121 Aneurysm of the ascending aorta, without rupture: Secondary | ICD-10-CM | POA: Insufficient documentation

## 2021-06-15 DIAGNOSIS — I700 Atherosclerosis of aorta: Secondary | ICD-10-CM | POA: Diagnosis not present

## 2021-06-15 LAB — POCT I-STAT CREATININE: Creatinine, Ser: 2.1 mg/dL — ABNORMAL HIGH (ref 0.61–1.24)

## 2021-06-15 MED ORDER — IOHEXOL 350 MG/ML SOLN
100.0000 mL | Freq: Once | INTRAVENOUS | Status: AC | PRN
  Administered 2021-06-15: 80 mL via INTRAVENOUS

## 2021-06-15 NOTE — Telephone Encounter (Signed)
Nurses ?Patient recently had CT scan of the chest for cardiac purposes ?It showed a ongoing issue in the lung which could be a infection ?I would recommend Augmentin 875 mg 1 twice daily for 7 days ?Also recommend for the patient to do a follow-up visit with me somewhere within the next 2 to 3 weeks.  He has an appointment in May please move this thank you ? ?Obviously if he is having significant pulmonary symptoms at immediate follow-up next week ? ?If anything emergent ER ?

## 2021-06-16 NOTE — Telephone Encounter (Signed)
LMTRC

## 2021-06-18 ENCOUNTER — Other Ambulatory Visit: Payer: Self-pay | Admitting: Family Medicine

## 2021-06-21 MED ORDER — AMOXICILLIN-POT CLAVULANATE 875-125 MG PO TABS: 1.0000 | ORAL_TABLET | Freq: Two times a day (BID) | ORAL | 0 refills | Status: DC

## 2021-06-21 NOTE — Telephone Encounter (Signed)
Prescription sent electronically to pharmacy. Patient notified and stated he does feel like he has a cold but no severe symptoms. Patient moved his appt to 07/06/21 at 9 am with Dr Nicki Reaper. ?

## 2021-07-06 ENCOUNTER — Encounter: Payer: Self-pay | Admitting: Family Medicine

## 2021-07-06 ENCOUNTER — Ambulatory Visit (INDEPENDENT_AMBULATORY_CARE_PROVIDER_SITE_OTHER): Payer: Medicare Other | Admitting: Family Medicine

## 2021-07-06 VITALS — BP 114/72 | HR 74 | Temp 97.0°F | Wt 217.0 lb

## 2021-07-06 DIAGNOSIS — J449 Chronic obstructive pulmonary disease, unspecified: Secondary | ICD-10-CM | POA: Insufficient documentation

## 2021-07-06 DIAGNOSIS — G4700 Insomnia, unspecified: Secondary | ICD-10-CM | POA: Diagnosis not present

## 2021-07-06 DIAGNOSIS — J189 Pneumonia, unspecified organism: Secondary | ICD-10-CM

## 2021-07-06 DIAGNOSIS — I700 Atherosclerosis of aorta: Secondary | ICD-10-CM

## 2021-07-06 DIAGNOSIS — G5603 Carpal tunnel syndrome, bilateral upper limbs: Secondary | ICD-10-CM | POA: Diagnosis not present

## 2021-07-06 DIAGNOSIS — J438 Other emphysema: Secondary | ICD-10-CM

## 2021-07-06 MED ORDER — CLARITHROMYCIN 500 MG PO TABS: 500.0000 mg | ORAL_TABLET | Freq: Two times a day (BID) | ORAL | 0 refills | Status: DC

## 2021-07-06 NOTE — Progress Notes (Signed)
? ?  Subjective:  ? ? Patient ID: Anthony Little, male    DOB: 1950/10/15, 71 y.o.   MRN: [id number] ? ?Hypertension ?This is a chronic problem. The problem has been gradually improving since onset. (Once in a while will the flush feeling in face) There are no compliance problems.   ? ?Patient had recent CT scan which showed a persistent of a possible infection ? ?Pt states that he has stopped drinking but is now unable to sleep. Pt states he will go to bed about 10 and wake up around 2 or 3 am.  ?Patient denies any high fever chills sweats ? ?Review of Systems ? ?   ?Objective:  ? Physical Exam ? ?General-in no acute distress ?Eyes-no discharge ?Lungs-respiratory rate normal, CTA ?CV-no murmurs,RRR ?Extremities skin warm dry no edema ?Neuro grossly normal ?Behavior normal, alert ?No crackles heard in the lungs ? ? ?   ?Assessment & Plan:  ?1. Bilateral carpal tunnel syndrome ?Patient with ongoing symptoms.  He states currently does not want to do nerve conduction studies.  Patient does agree that if he gets progressively worse over the next month he will do nerve conduction studies ? ?2. Community acquired pneumonia of left lower lobe of lung ?Biaxin prescribed patient recently coming off of amoxicillin, Biaxin will help cover atypicals, needs to do pulmonary function test because of underlying COPD.  Do this in 2 to 3 weeks.  Because of this being a persistent area and hard to tell if there is anything underlying very important for him to have a follow-up CT scan in 3 months time if it is still abnormal referral to pulmonary ?Shared discussion was held with the patient regarding this today ?- Pulmonary function test ? ?3. Other emphysema ([place]) ?Please see above for commentary and assessment plan ?- Pulmonary function test ? ?4. Aortic atherosclerosis ([place]) ?Initiate cholesterol medicine ?Recommend rosuvastatin 10 mg every day ?Recheck lipid liver panel in approximately 3 to 4 months ? ?Insomnia I am sympathetic  to what is going on with him but there is no great solution.  Medications can only cause other issues.  Behavioral techniques were discussed ? ? ?

## 2021-07-06 NOTE — Patient Instructions (Addendum)
Hi Ray ?It was good to see you today ? ?As we discussed I sent in a new antibiotic for you to take for the next 10 days with a snack ?Also we will be doing a repeat CT scan of your lungs in July with a follow-up visit at that time ?Please do your blood work cholesterol and kidney function which were ordered in February-these need to be completed in July ?If you have any questions concerns or issues please let us know ? ?TakeCare-Dr. Nicki Reaper ?

## 2021-07-07 NOTE — Progress Notes (Signed)
Left message to return call 07/07/21 ?

## 2021-07-25 ENCOUNTER — Ambulatory Visit: Payer: Medicare Other | Admitting: Family Medicine

## 2021-07-28 MED ORDER — ROSUVASTATIN CALCIUM 10 MG PO TABS: 10.0000 mg | ORAL_TABLET | Freq: Every day | ORAL | 1 refills | Status: DC

## 2021-07-28 NOTE — Addendum Note (Signed)
Addended by: Dairl Ponder on: 07/28/2021 11:33 AM   Modules accepted: Orders

## 2021-08-01 DIAGNOSIS — H0288A Meibomian gland dysfunction right eye, upper and lower eyelids: Secondary | ICD-10-CM | POA: Diagnosis not present

## 2021-08-01 DIAGNOSIS — H1033 Unspecified acute conjunctivitis, bilateral: Secondary | ICD-10-CM | POA: Diagnosis not present

## 2021-08-01 DIAGNOSIS — H0288B Meibomian gland dysfunction left eye, upper and lower eyelids: Secondary | ICD-10-CM | POA: Diagnosis not present

## 2021-08-02 ENCOUNTER — Telehealth: Payer: Self-pay

## 2021-08-02 NOTE — Telephone Encounter (Signed)
Pt called in stating that he had a question about these two meds:rosuvastatin (CRESTOR) 10 MG and atorvastatin (LIPITOR) tablet 10 mg. Pt wanted know if one med is replacing the other or if he is supposed to take both meds. Please advise.  Cb#: [phone]

## 2021-08-03 NOTE — Telephone Encounter (Signed)
Use rosuvastatin.  Stop atorvastatin.  May throw out atorvastatin

## 2021-08-03 NOTE — Telephone Encounter (Signed)
LMTRC

## 2021-08-03 NOTE — Telephone Encounter (Signed)
This is not a [facility] pt

## 2021-08-05 NOTE — Telephone Encounter (Signed)
Pt contacted and verbalized understanding.  

## 2021-08-30 ENCOUNTER — Ambulatory Visit (HOSPITAL_COMMUNITY)
Admission: RE | Admit: 2021-08-30 | Discharge: 2021-08-30 | Disposition: A | Discharge status: Home / Self Care | Payer: Medicare Other | Source: Ambulatory Visit | Attending: Family Medicine | Admitting: Family Medicine

## 2021-08-30 DIAGNOSIS — J189 Pneumonia, unspecified organism: Secondary | ICD-10-CM | POA: Insufficient documentation

## 2021-08-30 DIAGNOSIS — J438 Other emphysema: Secondary | ICD-10-CM | POA: Diagnosis not present

## 2021-08-30 MED ORDER — ALBUTEROL SULFATE (2.5 MG/3ML) 0.083% IN NEBU
2.5000 mg | INHALATION_SOLUTION | Freq: Once | RESPIRATORY_TRACT | Status: AC
  Administered 2021-08-30: 2.5 mg via RESPIRATORY_TRACT

## 2021-09-02 LAB — PULMONARY FUNCTION TEST
DL/VA % pred: 82 %
DL/VA: 3.34 ml/min/mmHg/L
DLCO unc % pred: 69 %
DLCO unc: 18 ml/min/mmHg
FEF 25-75 Post: 1.59 L/sec
FEF 25-75 Pre: 1.2 L/sec
FEF2575-%Change-Post: 32 %
FEF2575-%Pred-Post: 64 %
FEF2575-%Pred-Pre: 48 %
FEV1-%Change-Post: 6 %
FEV1-%Pred-Post: 74 %
FEV1-%Pred-Pre: 70 %
FEV1-Post: 2.44 L
FEV1-Pre: 2.3 L
FEV1FVC-%Change-Post: 1 %
FEV1FVC-%Pred-Pre: 89 %
FEV6-%Change-Post: 5 %
FEV6-%Pred-Post: 85 %
FEV6-%Pred-Pre: 80 %
FEV6-Post: 3.56 L
FEV6-Pre: 3.38 L
FEV6FVC-%Change-Post: 1 %
FEV6FVC-%Pred-Post: 103 %
FEV6FVC-%Pred-Pre: 102 %
FVC-%Change-Post: 4 %
FVC-%Pred-Post: 82 %
FVC-%Pred-Pre: 79 %
FVC-Post: 3.65 L
FVC-Pre: 3.5 L
Post FEV1/FVC ratio: 67 %
Post FEV6/FVC ratio: 98 %
Pre FEV1/FVC ratio: 66 %
Pre FEV6/FVC Ratio: 97 %
RV % pred: 127 %
RV: 3.12 L
TLC % pred: 94 %
TLC: 6.62 L

## 2021-09-06 ENCOUNTER — Encounter: Payer: Self-pay | Admitting: Internal Medicine

## 2021-09-07 ENCOUNTER — Other Ambulatory Visit: Payer: Self-pay | Admitting: Family Medicine

## 2021-09-14 ENCOUNTER — Other Ambulatory Visit: Payer: Self-pay | Admitting: *Deleted

## 2021-09-14 DIAGNOSIS — J438 Other emphysema: Secondary | ICD-10-CM

## 2021-09-14 DIAGNOSIS — J189 Pneumonia, unspecified organism: Secondary | ICD-10-CM

## 2021-09-15 ENCOUNTER — Telehealth: Payer: Self-pay | Admitting: *Deleted

## 2021-09-15 NOTE — Telephone Encounter (Signed)
Order placed for repeat CT chest to follow up on Pneumonia.

## 2021-09-21 ENCOUNTER — Ambulatory Visit (INDEPENDENT_AMBULATORY_CARE_PROVIDER_SITE_OTHER): Payer: Medicare Other | Admitting: Family Medicine

## 2021-09-21 VITALS — BP 133/88 | HR 82 | Temp 98.8°F | Ht 70.0 in | Wt 217.2 lb

## 2021-09-21 DIAGNOSIS — D649 Anemia, unspecified: Secondary | ICD-10-CM | POA: Diagnosis not present

## 2021-09-21 DIAGNOSIS — J438 Other emphysema: Secondary | ICD-10-CM | POA: Diagnosis not present

## 2021-09-21 DIAGNOSIS — J189 Pneumonia, unspecified organism: Secondary | ICD-10-CM

## 2021-09-21 DIAGNOSIS — I4821 Permanent atrial fibrillation: Secondary | ICD-10-CM | POA: Diagnosis not present

## 2021-09-21 DIAGNOSIS — I10 Essential (primary) hypertension: Secondary | ICD-10-CM | POA: Diagnosis not present

## 2021-09-21 DIAGNOSIS — E785 Hyperlipidemia, unspecified: Secondary | ICD-10-CM

## 2021-09-21 DIAGNOSIS — I4892 Unspecified atrial flutter: Secondary | ICD-10-CM | POA: Diagnosis not present

## 2021-09-21 DIAGNOSIS — I4891 Unspecified atrial fibrillation: Secondary | ICD-10-CM | POA: Diagnosis not present

## 2021-09-21 MED ORDER — METOPROLOL SUCCINATE ER 25 MG PO TB24: 25.0000 mg | ORAL_TABLET | Freq: Every day | ORAL | 1 refills | Status: DC

## 2021-09-21 NOTE — Progress Notes (Signed)
   Subjective:    Patient ID: Anthony Little, male    DOB: 1950-12-17, 71 y.o.   MRN: [id number]  HPI  Patient here for 3 month follow up  Patient wants to know if there is a generic form of eliquis. Patient denies any bleeding issues Essential hypertension - Plan: Basic Metabolic Panel, Lipid Profile, Hepatic function panel, CBC with Differential  Permanent atrial fibrillation (HCC) - Plan: metoprolol succinate (TOPROL-XL) 25 MG 24 hr tablet, Basic Metabolic Panel, Lipid Profile, Hepatic function panel, CBC with Differential  Atrial fibrillation and flutter (HCC) - Plan: Basic Metabolic Panel, Lipid Profile, Hepatic function panel, CBC with Differential  Hyperlipidemia, unspecified hyperlipidemia type - Plan: Basic Metabolic Panel, Lipid Profile, Hepatic function panel, CBC with Differential  Anemia, unspecified type - Plan: Basic Metabolic Panel, Lipid Profile, Hepatic function panel, CBC with Differential  Community acquired pneumonia of left lower lobe of lung  Other emphysema ([place]) Patient had recent abnormal x-ray recommend to do a follow-up scan has follow-up scans scheduled for early August  Review of Systems     Objective:   Physical Exam General-in no acute distress Eyes-no discharge Lungs-respiratory rate normal, CTA CV-no murmurs A-fib noted Extremities skin warm dry no edema Neuro grossly normal Behavior normal, alert  He was educated how to check his pulse      Assessment & Plan:  1. Permanent atrial fibrillation (HCC) Continue Eliquis to reduce risk of stroke Educated how to check pulse If ever having persistent elevated heart rate in the alarming level 140-150 go to ER If having other issues notify us - metoprolol succinate (TOPROL-XL) 25 MG 24 hr tablet; Take 1 tablet (25 mg total) by mouth daily.  Dispense: 90 tablet; Refill: 1 - Basic Metabolic Panel - Lipid Profile - Hepatic function panel - CBC with Differential  2. Essential  hypertension Blood pressure good control continue current measures - Basic Metabolic Panel - Lipid Profile - Hepatic function panel - CBC with Differential  3. Atrial fibrillation and flutter (HCC) Continue Eliquis bleeding precautions discussed - Basic Metabolic Panel - Lipid Profile - Hepatic function panel - CBC with Differential  4. Hyperlipidemia, unspecified hyperlipidemia type Continue medication watch diet portion control discussed to help with weight - Basic Metabolic Panel - Lipid Profile - Hepatic function panel - CBC with Differential  5. Anemia, unspecified type Check CBC because of Eliquis and history of anemia - Basic Metabolic Panel - Lipid Profile - Hepatic function panel - CBC with Differential  6. Community acquired pneumonia of left lower lobe of lung Was treated with antibiotics post to have follow-up CT scan in August may or may not need pulmonary consult  7. Other emphysema ([place]) Patient strongly counseled to quit smoking altogether Also counseled to stay away from alcohol  Follow-up in 4 months

## 2021-09-26 ENCOUNTER — Other Ambulatory Visit: Payer: Self-pay | Admitting: *Deleted

## 2021-09-26 MED ORDER — APIXABAN 5 MG PO TABS: 5.0000 mg | ORAL_TABLET | Freq: Two times a day (BID) | ORAL | 5 refills | Status: DC

## 2021-09-26 NOTE — Telephone Encounter (Signed)
Prescription refill request for Eliquis received. Indication:  Atrial Fib Last office visit: 04/15/21  Myles Gip MD Scr: 2.10 on 06/15/21 Age: 71 Weight: 90.7kg  Based on above findings Eliquis '5mg'$  twice daily is the appropriate dose.  Refill approved.

## 2021-10-12 ENCOUNTER — Ambulatory Visit (HOSPITAL_COMMUNITY)
Admission: RE | Admit: 2021-10-12 | Discharge: 2021-10-12 | Disposition: A | Discharge status: Home / Self Care | Payer: Medicare Other | Source: Ambulatory Visit | Attending: Family Medicine | Admitting: Family Medicine

## 2021-10-12 DIAGNOSIS — J438 Other emphysema: Secondary | ICD-10-CM | POA: Diagnosis not present

## 2021-10-12 DIAGNOSIS — J189 Pneumonia, unspecified organism: Secondary | ICD-10-CM | POA: Diagnosis not present

## 2021-10-12 DIAGNOSIS — J439 Emphysema, unspecified: Secondary | ICD-10-CM | POA: Diagnosis not present

## 2021-10-17 ENCOUNTER — Telehealth: Payer: Self-pay | Admitting: Cardiovascular Disease

## 2021-10-17 NOTE — Telephone Encounter (Signed)
Called and spoke to wife and patient.   States he has not had any nose bleeds and did not call with noses bleeds.  He did call to reschedule an appointment that was rescheduled correctly.

## 2021-10-17 NOTE — Telephone Encounter (Signed)
Patient's wife states the patient had 2 nose bleeds this past weekend and they both lasted about 20 minutes.

## 2021-10-19 ENCOUNTER — Ambulatory Visit: Payer: Medicare Other | Admitting: Cardiology

## 2021-11-25 ENCOUNTER — Other Ambulatory Visit: Payer: Self-pay | Admitting: Family Medicine

## 2021-12-08 ENCOUNTER — Ambulatory Visit (INDEPENDENT_AMBULATORY_CARE_PROVIDER_SITE_OTHER): Payer: Medicare Other | Admitting: Family Medicine

## 2021-12-08 ENCOUNTER — Encounter: Payer: Self-pay | Admitting: Family Medicine

## 2021-12-08 VITALS — BP 128/70 | HR 106 | Temp 98.8°F | Wt 218.6 lb

## 2021-12-08 DIAGNOSIS — H9313 Tinnitus, bilateral: Secondary | ICD-10-CM

## 2021-12-08 DIAGNOSIS — I10 Essential (primary) hypertension: Secondary | ICD-10-CM | POA: Diagnosis not present

## 2021-12-08 NOTE — Assessment & Plan Note (Signed)
Referring to ENT.  Needs audiological evaluation.  Currently he does not too troublesome as it has improved slightly. BP was initially elevated but on repeat was at goal.

## 2021-12-08 NOTE — Progress Notes (Signed)
Subjective:  Patient ID: Anthony Little, male    DOB: June 20, 1950  Age: 71 y.o. MRN: [id number]  CC: Chief Complaint  Patient presents with   Dizziness    Going on for 2 weeks. High pitched whine in ears-pt states today the "volume" has decreased.     HPI:  71 year old male presents for evaluation of the above.  Patient reports that he has had ongoing tinnitus for the past 6 to 8 months.  It has been worsening over the past week.  Hearing a high-pitched noise.  When he initially made the appointment it was quite severe.  Has lessened currently.  He has been using over-the-counter treatments for cerumen with hopes that it would help resolve this.  He has had some intermittent vertigo but this does not appear to be troublesome currently.  His blood pressures have been elevated at home as well.  Patient Active Problem List   Diagnosis Date Noted   Tinnitus of both ears 12/08/2021   Other emphysema ([place]) 07/06/2021   Depression, major, single episode, moderate ([place]) 04/26/2021   Gastritis without bleeding    Atrial fibrillation and flutter (HCC)    CKD (chronic kidney disease) stage 3, GFR 30-59 ml/min (HCC)    Purpura senilis ([employer]) 03/18/2020   Pulmonary nodule 07/14/2019   Ectatic thoracic aorta ([place]) 07/14/2019   Coronary artery disease involving native coronary artery of native heart with angina pectoris ([place])    Aortic atherosclerosis ([place]) 03/07/2016   Hyperlipidemia 01/14/2016   History of colonic polyps 10/21/2015   Essential hypertension 06/08/2014   Brain aneurysm 09/04/2013   Alcohol abuse 09/04/2013    Social Hx   Social History   Socioeconomic History   Marital status: Married    Spouse name: Not on file   Number of children: Not on file   Years of education: Not on file   Highest education level: Not on file  Occupational History   Occupation: [employer] Research officer, trade union  Tobacco Use   Smoking status: Every Day    Packs/day: 0.15    Types: Cigarettes    Start  date: 10/04/2015   Smokeless tobacco: Never   Tobacco comments:    he would like to but he knows it is hard   Vaping Use   Vaping Use: Never used  Substance and Sexual Activity   Alcohol use: Yes    Alcohol/week: 4.0 standard drinks of alcohol    Types: 4 Standard drinks or equivalent per week   Drug use: No   Sexual activity: Not on file  Other Topics Concern   Not on file  Social History Narrative   Not on file   Social Determinants of Health   Financial Resource Strain: Not on file  Food Insecurity: Not on file  Transportation Needs: Not on file  Physical Activity: Not on file  Stress: Not on file  Social Connections: Not on file    Review of Systems Per HPI  Objective:  BP 128/70   Pulse (!) 106   Temp 98.8 F (37.1 C)   Wt 218 lb 9.6 oz (99.2 kg)   SpO2 97%   BMI 31.37 kg/m      12/08/2021    2:47 PM 12/08/2021    2:25 PM 12/08/2021    2:22 PM  BP/Weight  Systolic BP 073 710 626  Diastolic BP 70 82 86  Wt. (Lbs)   218.6  BMI   31.37 kg/m2    Physical Exam Vitals and nursing note reviewed.  Constitutional:      General: He is not in acute distress.    Appearance: Normal appearance.  HENT:     Head: Normocephalic and atraumatic.     Right Ear: Tympanic membrane and external ear normal. There is no impacted cerumen.     Left Ear: Tympanic membrane and external ear normal. There is no impacted cerumen.  Cardiovascular:     Comments: Irregularly irregular. Pulmonary:     Effort: Pulmonary effort is normal.     Breath sounds: Normal breath sounds. No wheezing, rhonchi or rales.  Neurological:     Mental Status: He is alert.     Lab Results  Component Value Date   WBC 5.8 03/08/2021   HGB 11.9 (L) 03/08/2021   HCT 33.4 (L) 03/08/2021   PLT 250 03/08/2021   GLUCOSE 88 04/12/2021   CHOL 173 10/13/2020   TRIG 61 10/13/2020   HDL 97 10/13/2020   LDLCALC 64 10/13/2020   ALT 19 03/08/2021   AST 26 03/08/2021   NA 136 04/12/2021   K 4.9  04/12/2021   CL 102 04/12/2021   CREATININE 2.10 (H) 06/15/2021   BUN 21 04/12/2021   CO2 20 04/12/2021   TSH 1.090 08/13/2015   PSA 1.06 07/30/2012   INR 1.0 02/16/2021     Assessment & Plan:   Problem List Items Addressed This Visit       Cardiovascular and Mediastinum   Essential hypertension (Chronic)    Blood pressure seems labile.  Was quite elevated here initially but was improved on repeat.  He states that his home readings tend to run high.  Continue metoprolol.  Will discuss this with his primary provider.        Other   Tinnitus of both ears - Primary    Referring to ENT.  Needs audiological evaluation.  Currently he does not too troublesome as it has improved slightly. BP was initially elevated but on repeat was at goal.      Relevant Orders   Ambulatory referral to ENT   Follow-up:  As previously scheduled.  Anthony Little

## 2021-12-08 NOTE — Patient Instructions (Signed)
I have placed referral to ENT.  If you have not heard anything from our office or their office in the next 2 weeks, please call and let us know.  Continue to watch her blood pressures closely at home.  Goal less than 140/90.

## 2021-12-08 NOTE — Assessment & Plan Note (Signed)
Blood pressure seems labile.  Was quite elevated here initially but was improved on repeat.  He states that his home readings tend to run high.  Continue metoprolol.  Will discuss this with his primary provider.

## 2021-12-16 ENCOUNTER — Encounter (HOSPITAL_COMMUNITY): Payer: Self-pay | Admitting: *Deleted

## 2021-12-16 ENCOUNTER — Emergency Department (HOSPITAL_COMMUNITY): Payer: Medicare Other

## 2021-12-16 ENCOUNTER — Other Ambulatory Visit: Payer: Self-pay

## 2021-12-16 ENCOUNTER — Emergency Department (HOSPITAL_COMMUNITY)
Admission: EM | Admit: 2021-12-16 | Discharge: 2021-12-16 | Disposition: A | Discharge status: Home / Self Care | Payer: Medicare Other | Attending: Emergency Medicine | Admitting: Emergency Medicine

## 2021-12-16 DIAGNOSIS — I700 Atherosclerosis of aorta: Secondary | ICD-10-CM | POA: Diagnosis not present

## 2021-12-16 DIAGNOSIS — Z20822 Contact with and (suspected) exposure to covid-19: Secondary | ICD-10-CM | POA: Insufficient documentation

## 2021-12-16 DIAGNOSIS — Z79899 Other long term (current) drug therapy: Secondary | ICD-10-CM | POA: Insufficient documentation

## 2021-12-16 DIAGNOSIS — R600 Localized edema: Secondary | ICD-10-CM | POA: Diagnosis not present

## 2021-12-16 DIAGNOSIS — Z7901 Long term (current) use of anticoagulants: Secondary | ICD-10-CM | POA: Diagnosis not present

## 2021-12-16 DIAGNOSIS — N289 Disorder of kidney and ureter, unspecified: Secondary | ICD-10-CM | POA: Insufficient documentation

## 2021-12-16 DIAGNOSIS — R059 Cough, unspecified: Secondary | ICD-10-CM | POA: Insufficient documentation

## 2021-12-16 DIAGNOSIS — R079 Chest pain, unspecified: Secondary | ICD-10-CM | POA: Diagnosis not present

## 2021-12-16 DIAGNOSIS — I712 Thoracic aortic aneurysm, without rupture, unspecified: Secondary | ICD-10-CM | POA: Insufficient documentation

## 2021-12-16 DIAGNOSIS — R42 Dizziness and giddiness: Secondary | ICD-10-CM | POA: Diagnosis not present

## 2021-12-16 DIAGNOSIS — I719 Aortic aneurysm of unspecified site, without rupture: Secondary | ICD-10-CM | POA: Diagnosis not present

## 2021-12-16 DIAGNOSIS — R0789 Other chest pain: Secondary | ICD-10-CM | POA: Diagnosis not present

## 2021-12-16 DIAGNOSIS — R0781 Pleurodynia: Secondary | ICD-10-CM

## 2021-12-16 DIAGNOSIS — R0602 Shortness of breath: Secondary | ICD-10-CM | POA: Diagnosis present

## 2021-12-16 DIAGNOSIS — E875 Hyperkalemia: Secondary | ICD-10-CM | POA: Diagnosis not present

## 2021-12-16 LAB — BRAIN NATRIURETIC PEPTIDE: B Natriuretic Peptide: 253 pg/mL — ABNORMAL HIGH (ref 0.0–100.0)

## 2021-12-16 LAB — SARS CORONAVIRUS 2 BY RT PCR: SARS Coronavirus 2 by RT PCR: NEGATIVE

## 2021-12-16 LAB — CBC
HCT: 43.3 % (ref 39.0–52.0)
Hemoglobin: 14.6 g/dL (ref 13.0–17.0)
MCH: 35.4 pg — ABNORMAL HIGH (ref 26.0–34.0)
MCHC: 33.7 g/dL (ref 30.0–36.0)
MCV: 105.1 fL — ABNORMAL HIGH (ref 80.0–100.0)
Platelets: 231 10*3/uL (ref 150–400)
RBC: 4.12 MIL/uL — ABNORMAL LOW (ref 4.22–5.81)
RDW: 13.1 % (ref 11.5–15.5)
WBC: 9.5 10*3/uL (ref 4.0–10.5)
nRBC: 0 % (ref 0.0–0.2)

## 2021-12-16 LAB — BASIC METABOLIC PANEL
Anion gap: 10 (ref 5–15)
BUN: 25 mg/dL — ABNORMAL HIGH (ref 8–23)
CO2: 25 mmol/L (ref 22–32)
Calcium: 8.9 mg/dL (ref 8.9–10.3)
Chloride: 100 mmol/L (ref 98–111)
Creatinine, Ser: 1.89 mg/dL — ABNORMAL HIGH (ref 0.61–1.24)
GFR, Estimated: 38 mL/min — ABNORMAL LOW (ref >60)
Glucose, Bld: 126 mg/dL — ABNORMAL HIGH (ref 70–99)
Potassium: 5.3 mmol/L — ABNORMAL HIGH (ref 3.5–5.1)
Sodium: 135 mmol/L (ref 135–145)

## 2021-12-16 LAB — TROPONIN I (HIGH SENSITIVITY)
Troponin I (High Sensitivity): 5 ng/L (ref <18)
Troponin I (High Sensitivity): 4 ng/L (ref <18)

## 2021-12-16 MED ORDER — IOHEXOL 350 MG/ML SOLN
100.0000 mL | Freq: Once | INTRAVENOUS | Status: AC | PRN
  Administered 2021-12-16: 100 mL via INTRAVENOUS

## 2021-12-16 MED ORDER — HYDROCODONE-ACETAMINOPHEN 5-325 MG PO TABS: 1.0000 | ORAL_TABLET | ORAL | 0 refills | Status: DC | PRN

## 2021-12-16 MED ORDER — OXYCODONE-ACETAMINOPHEN 5-325 MG PO TABS: 1.0000 | ORAL_TABLET | Freq: Four times a day (QID) | ORAL | 0 refills | Status: DC | PRN

## 2021-12-16 MED ORDER — SODIUM CHLORIDE 0.9 % IV BOLUS
500.0000 mL | Freq: Once | INTRAVENOUS | Status: AC
  Administered 2021-12-16: 500 mL via INTRAVENOUS

## 2021-12-16 MED ORDER — MORPHINE SULFATE (PF) 4 MG/ML IV SOLN
4.0000 mg | Freq: Once | INTRAVENOUS | Status: AC
  Administered 2021-12-16: 4 mg via INTRAVENOUS
  Filled 2021-12-16: qty 1

## 2021-12-16 NOTE — ED Triage Notes (Signed)
Pt c/o chest pain that started a few weeks ago; pt states he has chest congestion and has been coughing up white/green sputum  Pt states the pain is worse when he takes a breath and it does not radiate

## 2021-12-16 NOTE — ED Notes (Signed)
Pt ambulated in hallway with pulse ox. O2 stats went from 98% to 93%. Gait was slow but steady.

## 2021-12-16 NOTE — ED Provider Notes (Signed)
Patient feeling better.  Patient's troponins x2 were very normal.  CT angio chest abdomen and pelvis showed stable distal aortic arch aneurysm.  Showed did show a small pericardial effusion which could reflect sequela of pericarditis given the right clinical presentation.  However patient's troponins are quite normal.  Patient will be discharged home with pain meds.  Patient will need follow-up with primary care doctor regarding some mild high hyperkalemia and renal insufficiency.  Recommending that he make an appointment follow back up with his cardiologist.  Patient will return for any new or worse symptoms.   Fredia Sorrow, MD 12/16/21 [phone]

## 2021-12-16 NOTE — Discharge Instructions (Addendum)
You were seen in the emergency department for evaluation of chest pain with cough and deep breathing.  You had blood work chest x-ray EKG and a CAT scan of your chest abdomen and pelvis.  There is no evidence of heart injury.  Your CAT scan showed stable aneurysm.  We are prescribing a short course of some pain medicine.  We have also put a referral in for follow-up with a lung specialist.  Please continue your regular medications and follow-up with your primary care doctor.  Return to the emergency department if any worsening or concerning symptoms.  Make an appointment follow-up with cardiology call them on Monday.  Also follow back up with your primary care doctor to have your kidney function rechecked.  And your potassium was slightly elevated and they can recheck that as well.  Prescription for pain medicine sent to [facility] in [place].  But a prepack will be given here to help you through the weekend.  Return for any new or worse symptoms.

## 2021-12-16 NOTE — ED Provider Notes (Signed)
[facility] EMERGENCY DEPARTMENT Provider Note   CSN: [id number] Arrival date & time: 12/16/21  4782     History  Chief Complaint  Patient presents with   Chest Pain    Anthony Little is a 71 y.o. male.  He has a history of A-fib on Eliquis.  He has had a productive cough and shortness of breath for about 3 weeks.  Today is experiencing central chest pain with deep breathing or cough.  Rates it as 8 out of 10.  Pain causes him to feel nauseous and lightheaded.  Cough is productive of some mostly clear or white sputum.  No fevers.  Has no pain at rest.  Did have a fall recently due to his vertigo where he struck his head no loss of consciousness.  Denies any headache.  The history is provided by the patient.  Chest Pain Pain location:  Substernal area Pain quality: pressure   Pain radiates to:  Does not radiate Pain severity:  Severe Onset quality:  Sudden Duration:  4 hours Timing:  Intermittent Progression:  Unchanged Chronicity:  New Context: breathing   Relieved by:  None tried Worsened by:  Coughing and deep breathing Ineffective treatments:  None tried Associated symptoms: cough, dizziness, nausea and shortness of breath   Associated symptoms: no abdominal pain, no altered mental status, no diaphoresis, no fever, no syncope and no vomiting   Risk factors: aortic disease, coronary artery disease, high cholesterol, hypertension and smoking        Home Medications Prior to Admission medications   Medication Sig Start Date End Date Taking? Authorizing Provider  apixaban (ELIQUIS) 5 MG TABS tablet Take 1 tablet (5 mg total) by mouth 2 (two) times daily. 09/26/21   Satira Sark, MD  buPROPion (WELLBUTRIN XL) 150 MG 24 hr tablet TAKE 1 TABLET BY MOUTH EVERY DAY 01/26/21   Kathyrn Drown, MD  folic acid (FOLVITE) 1 MG tablet Take 1 tablet (1 mg total) by mouth daily. 02/23/21   Johnson, Clanford L, MD  glucosamine-chondroitin 500-400 MG tablet Take 2 tablets by  mouth every morning.    [provider]  metoprolol succinate (TOPROL-XL) 25 MG 24 hr tablet Take 1 tablet (25 mg total) by mouth daily. 09/21/21   Kathyrn Drown, MD  mometasone (ELOCON) 0.1 % cream Apply 1 application topically 2 (two) times daily as needed (irritation). Not for face or groin 02/22/21   Murlean Iba, MD  Multiple Vitamin (MULTIVITAMIN WITH MINERALS) TABS tablet Take 1 tablet by mouth daily. 02/23/21   Johnson, Clanford L, MD  pantoprazole (PROTONIX) 40 MG tablet Take 1 tablet (40 mg total) by mouth 2 (two) times daily before a meal. 02/22/21   Johnson, Clanford L, MD  potassium chloride (KLOR-CON) 10 MEQ tablet Take 10 mEq by mouth daily. 01/19/20   [provider]  rosuvastatin (CRESTOR) 10 MG tablet Take 1 tablet (10 mg total) by mouth daily. 07/28/21   Kathyrn Drown, MD      Allergies    Patient has no known allergies.    Review of Systems   Review of Systems  Constitutional:  Negative for diaphoresis and fever.  Respiratory:  Positive for cough and shortness of breath.   Cardiovascular:  Positive for chest pain. Negative for syncope.  Gastrointestinal:  Positive for nausea. Negative for abdominal pain and vomiting.  Neurological:  Positive for dizziness and light-headedness.    Physical Exam Updated Vital Signs BP 112/75   Pulse 82  Temp 98.1 F (36.7 C) (Oral)   Resp 20   Ht '5\' 10"'$  (1.778 m)   Wt 96.2 kg   SpO2 100%   BMI 30.42 kg/m  Physical Exam Vitals and nursing note reviewed.  Constitutional:      General: He is not in acute distress.    Appearance: He is well-developed.  HENT:     Head: Normocephalic and atraumatic.  Eyes:     Conjunctiva/sclera: Conjunctivae normal.  Cardiovascular:     Rate and Rhythm: Normal rate. Rhythm irregular.     Heart sounds: Normal heart sounds. No murmur heard. Pulmonary:     Effort: Pulmonary effort is normal. No respiratory distress.     Breath sounds: Normal breath sounds.   Abdominal:     Palpations: Abdomen is soft.     Tenderness: There is no abdominal tenderness. There is no guarding or rebound.  Musculoskeletal:        General: No swelling. Normal range of motion.     Cervical back: Neck supple.     Right lower leg: No tenderness. Edema present.     Left lower leg: No tenderness. Edema present.  Skin:    General: Skin is warm and dry.     Capillary Refill: Capillary refill takes less than 2 seconds.  Neurological:     General: No focal deficit present.     Mental Status: He is alert.     ED Results / Procedures / Treatments   Labs (all labs ordered are listed, but only abnormal results are displayed) Labs Reviewed  BASIC METABOLIC PANEL - Abnormal; Notable for the following components:      Result Value   Potassium 5.3 (*)    Glucose, Bld 126 (*)    BUN 25 (*)    Creatinine, Ser 1.89 (*)    GFR, Estimated 38 (*)    All other components within normal limits  CBC - Abnormal; Notable for the following components:   RBC 4.12 (*)    MCV 105.1 (*)    MCH 35.4 (*)    All other components within normal limits  BRAIN NATRIURETIC PEPTIDE - Abnormal; Notable for the following components:   B Natriuretic Peptide 253.0 (*)    All other components within normal limits  SARS CORONAVIRUS 2 BY RT PCR  TROPONIN I (HIGH SENSITIVITY)  TROPONIN I (HIGH SENSITIVITY)    EKG EKG Interpretation  Date/Time:  Friday December 16 2021 10:05:04 EDT Ventricular Rate:  87 PR Interval:    QRS Duration: 98 QT Interval:  368 QTC Calculation: 442 R Axis:   71 Text Interpretation: Atrial fibrillation Abnormal ECG When compared with ECG of 16-Feb-2021 11:37, No significant change since last tracing Confirmed by Aletta Edouard [phone]) on 12/16/2021 10:09:26 AM  Radiology CT Angio Chest/Abd/Pel for Dissection W and/or W/WO  Result Date: 12/16/2021 CLINICAL DATA:  Chest pain for several weeks, chest congestion, productive cough, pain with inspiration EXAM: CT  ANGIOGRAPHY CHEST, ABDOMEN AND PELVIS TECHNIQUE: Non-contrast CT of the chest was initially obtained. Multidetector CT imaging through the chest, abdomen and pelvis was performed using the standard protocol during bolus administration of intravenous contrast. Multiplanar reconstructed images and MIPs were obtained and reviewed to evaluate the vascular anatomy. RADIATION DOSE REDUCTION: This exam was performed according to the departmental dose-optimization program which includes automated exposure control, adjustment of the mA and/or kV according to patient size and/or use of iterative reconstruction technique. CONTRAST:  158m OMNIPAQUE IOHEXOL 350 MG/ML SOLN COMPARISON:  12/16/2021,  10/12/2021, 06/15/2021 FINDINGS: CTA CHEST FINDINGS Cardiovascular: Stable dilation of the ascending thoracic aorta measuring up to 3.9 cm and of the distal aortic arch measuring up to 3.5 cm. No evidence of thoracic aortic dissection. Stable atherosclerosis. The heart is not enlarged. Interval development of small pericardial effusion, which may reflect pericarditis given clinical presentation. Stable atherosclerosis of the coronary vasculature. Evaluation of the pulmonary vasculature is limited due to timing of contrast bolus. Pulmonary emboli cannot be excluded. Mediastinum/Nodes: No enlarged mediastinal, hilar, or axillary lymph nodes. Thyroid gland, trachea, and esophagus demonstrate no significant findings. Lungs/Pleura: Stable upper lobe predominant emphysema. Scattered hypoventilatory changes at the lung bases with subsegmental atelectasis in the lower lobes. Trace bilateral pleural fluid. No pneumothorax. Central airways are patent. Stable benign 4 mm left lower lobe pulmonary nodules reference image 90/9 and 101/9. Musculoskeletal: No acute or destructive bony lesions. Reconstructed images demonstrate no additional findings. Review of the MIP images confirms the above findings. CTA ABDOMEN AND PELVIS FINDINGS VASCULAR Aorta:  Normal caliber aorta without aneurysm, dissection, vasculitis or significant stenosis. Diffuse atherosclerosis. Celiac: Patent without evidence of aneurysm, dissection, vasculitis or significant stenosis. Mild diffuse atherosclerosis. SMA: Patent without evidence of aneurysm, dissection, vasculitis or significant stenosis. Moderate atherosclerosis at the origin of the SMA without critical stenosis. Renals: Both renal arteries are patent without evidence of aneurysm, dissection, vasculitis, fibromuscular dysplasia or significant stenosis. Mild atherosclerosis at the origin of the bilateral renal arteries. IMA: Patent without evidence of aneurysm, dissection, vasculitis or significant stenosis. Inflow: Patent without evidence of aneurysm, dissection, vasculitis or significant stenosis. Diffuse atherosclerosis. Veins: No obvious venous abnormality within the limitations of this arterial phase study. Review of the MIP images confirms the above findings. NON-VASCULAR Hepatobiliary: No focal liver abnormality is seen. No gallstones, gallbladder wall thickening, or biliary dilatation. Pancreas: Unremarkable. No pancreatic ductal dilatation or surrounding inflammatory changes. Spleen: Normal in size without focal abnormality. Adrenals/Urinary Tract: Bilateral renal cortical scarring and thinning. No urinary tract calculi or obstructive uropathy. The adrenals and bladder are unremarkable. Stomach/Bowel: No bowel obstruction or ileus. Normal appendix right lower quadrant. Sigmoid diverticulosis without diverticulitis. No bowel wall thickening or inflammatory change. Lymphatic: No pathologic adenopathy within the abdomen or pelvis. Reproductive: Prostate is unremarkable. Other: No free fluid no free intraperitoneal gas. Small bilateral fat containing inguinal hernias. No bowel herniation. Musculoskeletal: No acute or destructive bony lesions. Reconstructed images demonstrate no additional findings. Review of the MIP images  confirms the above findings. IMPRESSION: 1. Small pericardial effusion, which could reflect sequela of pericarditis given clinical presentation. 2. Stable 3.5 cm aneurysm of the distal aortic arch. Recommend annual imaging followup by CTA or MRA. This recommendation follows 2010 ACCF/AHA/AATS/ACR/ASA/SCA/SCAI/SIR/STS/SVM Guidelines for the Diagnosis and Management of Patients with Thoracic Aortic Disease. Circulation.2010; 121: J478-G956. Aortic aneurysm NOS (ICD10-I71.9) 3. No evidence of thoracoabdominal aortic dissection. 4. Distal colonic diverticulosis without diverticulitis. 5. Aortic Atherosclerosis (ICD10-I70.0) and Emphysema (ICD10-J43.9). Electronically Signed   By: Randa Ngo M.D.   On: 12/16/2021 15:46   DG Chest 2 View  Result Date: 12/16/2021 CLINICAL DATA:  Chest pain since this morning. EXAM: CHEST - 2 VIEW COMPARISON:  Chest x-rays dated 03/24/2021 and 02/16/2021. Chest CT dated 10/12/2021. FINDINGS: Heart size and mediastinal contours are stable. Chronic bronchitic changes noted centrally. Coarse lung markings are again seen bilaterally. No confluence opacity to suggest a superimposed pneumonia. No pleural effusion or pneumothorax is seen. No acute-appearing osseous abnormality. Stable chronic elevation of the RIGHT hemidiaphragm. IMPRESSION: 1. No active cardiopulmonary disease. No  evidence of pneumonia or pulmonary edema. 2. Chronic bronchitic changes and chronic interstitial lung disease. Electronically Signed   By: Franki Cabot M.D.   On: 12/16/2021 10:26    Procedures Procedures    Medications Ordered in ED Medications  morphine (PF) 4 MG/ML injection 4 mg (4 mg Intravenous Given 12/16/21 1225)  sodium chloride 0.9 % bolus 500 mL (0 mLs Intravenous Stopped 12/16/21 1247)  iohexol (OMNIPAQUE) 350 MG/ML injection 100 mL (100 mLs Intravenous Contrast Given 12/16/21 1513)    ED Course/ Medical Decision Making/ A&P Clinical Course as of 12/16/21 1824  Fri Dec 16, 2021  1025  Chest x-ray interpreted by me as no acute infiltrate.  Awaiting radiology reading [MB]  1333 Patient's pain is better after the morphine.  His lab work is all baseline chest x-ray unremarkable.  No evidence of cardiac injury.  I think PE is less likely as he is been compliant with anticoagulation.  COVID-negative.  He is also concerned with some chronic fatigue that he has had since his pneumonia.  We will put a referral in for pulmonary [MB]    Clinical Course User Index [MB] Hayden Rasmussen, MD                           Medical Decision Making Amount and/or Complexity of Data Reviewed Labs: ordered. Radiology: ordered.  Risk Prescription drug management.   This patient complains of ongoing cough since pneumonia few months ago, new pleuritic chest pain; this involves an extensive number of treatment Options and is a complaint that carries with it a high risk of complications and morbidity. The differential includes pleurisy, pneumonia, thorax, PE, vascular, ACS pneumonia  I ordered, reviewed and interpreted labs, which included CBC with normal white count normal hemoglobin, chemistries with mildly elevated potassium elevated BUN and creatinine although better than priors, troponins flat, BMP stable, COVID-negative I ordered medication IV pain medication with improvement in his symptoms and reviewed PMP when indicated. I ordered imaging studies which included chest x-ray and CT angio chest and I independently    visualized and interpreted imaging which showed no obvious pneumothorax.  CT angio is pending at time of signout. Additional history obtained from patient's wife Previous records obtained and reviewed in epic including recent PCP visits  Cardiac monitoring reviewed, A-fib with controlled ventricular rate Social determinants considered, patient with ongoing depression and tobacco use Critical Interventions: None  After the interventions stated above, I reevaluated the patient  and found patient's pain to be improved Admission and further testing considered, his care is signed out to Dr. Rogene Houston to follow-up on results of CT angio chest abdomen and pelvis.  If no acute findings and he is feeling better he likely can be discharged to follow-up outpatient with his PCP.  Return instructions discussed         Final Clinical Impression(s) / ED Diagnoses Final diagnoses:  Pleuritic chest pain  Cough, unspecified type  Thoracic aortic aneurysm (TAA), unspecified part, unspecified whether ruptured [facility])  Renal insufficiency  Hyperkalemia    Rx / DC Orders ED Discharge Orders          Ordered    Ambulatory referral to Pulmonology        12/16/21 1423    oxyCODONE-acetaminophen (PERCOCET/ROXICET) 5-325 MG tablet  Every 6 hours PRN        12/16/21 1508    HYDROcodone-acetaminophen (NORCO/VICODIN) 5-325 MG tablet  Every 4 hours PRN  12/16/21 1636              Hayden Rasmussen, MD 12/16/21 1827

## 2021-12-16 NOTE — ED Notes (Signed)
Pt given hydrocodone prepack Charge RN Vance witnessed.

## 2021-12-18 MED FILL — Hydrocodone-Acetaminophen Tab 5-325 MG: ORAL | Qty: 6 | Status: AC

## 2021-12-20 ENCOUNTER — Telehealth: Payer: Self-pay

## 2021-12-20 NOTE — Telephone Encounter (Signed)
   Reason for call: ED-Follow up call   Patient  visit on 12/16/2021  at [facility] was for Chest Pain  Have you been able to follow up with your primary care physician? - Patient advised he is waiting on a callback from PCP office. CG inquired if patient needed any assistance on this and patient stated not at this time. Patient advised he will contact PCP if needed again. Patient also provided feedback for great staff work at [facility].  The patient was or was not able to obtain any needed medicine or equipment. - Yes  Are there diet recommendations that you are having difficulty following? - No  Patient expresses understanding of discharge instructions and education provided has no other needs at this time.   [place] management  [place], [place]  Main Phone: [phone]  E-mail: [email or web address]  Website: [email or web address]

## 2021-12-23 ENCOUNTER — Encounter: Payer: Self-pay | Admitting: Family Medicine

## 2021-12-23 ENCOUNTER — Ambulatory Visit (INDEPENDENT_AMBULATORY_CARE_PROVIDER_SITE_OTHER): Payer: Medicare Other | Admitting: Family Medicine

## 2021-12-23 VITALS — BP 130/76 | Wt 215.6 lb

## 2021-12-23 DIAGNOSIS — N1832 Chronic kidney disease, stage 3b: Secondary | ICD-10-CM

## 2021-12-23 DIAGNOSIS — G479 Sleep disorder, unspecified: Secondary | ICD-10-CM | POA: Diagnosis not present

## 2021-12-23 DIAGNOSIS — I10 Essential (primary) hypertension: Secondary | ICD-10-CM

## 2021-12-23 DIAGNOSIS — D539 Nutritional anemia, unspecified: Secondary | ICD-10-CM | POA: Diagnosis not present

## 2021-12-23 DIAGNOSIS — E7849 Other hyperlipidemia: Secondary | ICD-10-CM | POA: Diagnosis not present

## 2021-12-23 DIAGNOSIS — Z23 Encounter for immunization: Secondary | ICD-10-CM

## 2021-12-23 DIAGNOSIS — J438 Other emphysema: Secondary | ICD-10-CM | POA: Diagnosis not present

## 2021-12-23 DIAGNOSIS — F321 Major depressive disorder, single episode, moderate: Secondary | ICD-10-CM

## 2021-12-23 MED ORDER — TAMSULOSIN HCL 0.4 MG PO CAPS: ORAL_CAPSULE | ORAL | 3 refills | Status: DC

## 2021-12-23 MED ORDER — SERTRALINE HCL 50 MG PO TABS: 50.0000 mg | ORAL_TABLET | Freq: Every day | ORAL | 3 refills | Status: DC

## 2021-12-23 NOTE — Progress Notes (Unsigned)
   Subjective:    Patient ID: Anthony Little, male    DOB: 27-Oct-1950, 71 y.o.   MRN: [id number]  HPI Pt arrives for ER Follow Up. Pt went to [facility] ED due to chest pain. 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 Pt wife reports him being sick recently-fatigued,congestion , disoriented(3 weeks). Wife reports ongoing disorientation and fatigue. Lucid yesterday and Wednesday.  s Rather complex issue  Review of Systems     Objective:   Physical Exam  General-in no acute distress Eyes-no discharge Lungs-respiratory rate normal, CTA CV-no murmurs,RRR Extremities skin warm dry no edema Neuro grossly normal Behavior normal, alert       Assessment & Plan:  1. Need for vaccination Flu shot today - Flu Vaccine QUAD High Dose(Fluad)  2. Macrocytic anemia Lab work ordered - Vitamin I01 - Basic metabolic panel - TSH - T4, Free  3. Sleep disturbance Sleep study ordered - Ambulatory referral to Sleep Studies  4. Stage 3b chronic kidney disease ([place]) Progressive monitor closely stay away from anti-inflammatories cut back alcohol to 1 drink per day blood pressure good May need to get nephrology involved 5. Essential hypertension Blood pressure good currently  6. Other emphysema ([place]) Family to keep track of his coughing congestion shortness of breath if this is a daily occurrence we will go with inhaler such as Dulera or Symbicort  7. Other hyperlipidemia Healthy eating lab work ordered  8. Depression, major, single episode, moderate (HCC) Zoloft 50 mg half tablet daily to begin with then 1 tablet thereafter follow-up 3 to 4 weeks  [place] cognitive assessment scores 27 out of 30

## 2022-01-03 ENCOUNTER — Other Ambulatory Visit: Payer: Self-pay

## 2022-01-03 ENCOUNTER — Telehealth: Payer: Self-pay

## 2022-01-03 ENCOUNTER — Ambulatory Visit: Payer: Medicare Other | Admitting: Family Medicine

## 2022-01-03 ENCOUNTER — Other Ambulatory Visit: Payer: Self-pay | Admitting: *Deleted

## 2022-01-03 DIAGNOSIS — R300 Dysuria: Secondary | ICD-10-CM | POA: Diagnosis not present

## 2022-01-03 DIAGNOSIS — N1832 Chronic kidney disease, stage 3b: Secondary | ICD-10-CM

## 2022-01-03 DIAGNOSIS — D539 Nutritional anemia, unspecified: Secondary | ICD-10-CM | POA: Diagnosis not present

## 2022-01-03 LAB — POCT URINALYSIS DIP (CLINITEK)
Bilirubin, UA: NEGATIVE
Glucose, UA: NEGATIVE mg/dL
Ketones, POC UA: NEGATIVE mg/dL
Leukocytes, UA: NEGATIVE
Nitrite, UA: NEGATIVE
POC PROTEIN,UA: 30 — AB
Spec Grav, UA: 1.02 (ref 1.010–1.025)
Urobilinogen, UA: 0.2 E.U./dL
pH, UA: 6 (ref 5.0–8.0)

## 2022-01-03 NOTE — Telephone Encounter (Signed)
Caller name: Greogory Cornette Fabro  On DPR?:Yes  Call back number: [phone] (mobile)  Provider they see: Kathyrn Drown, MD  Reason for call:Urine Sample left

## 2022-01-03 NOTE — Telephone Encounter (Signed)
Left message for patient to return the call for additional details and recommendations. Regarding urine sample

## 2022-01-04 LAB — BASIC METABOLIC PANEL
BUN/Creatinine Ratio: 15 (ref 10–24)
BUN: 21 mg/dL (ref 8–27)
CO2: 20 mmol/L (ref 20–29)
Calcium: 8.9 mg/dL (ref 8.6–10.2)
Chloride: 98 mmol/L (ref 96–106)
Creatinine, Ser: 1.44 mg/dL — ABNORMAL HIGH (ref 0.76–1.27)
Glucose: 99 mg/dL (ref 70–99)
Potassium: 4.7 mmol/L (ref 3.5–5.2)
Sodium: 136 mmol/L (ref 134–144)
eGFR: 52 mL/min/{1.73_m2} — ABNORMAL LOW (ref >59)

## 2022-01-04 LAB — VITAMIN B12: Vitamin B-12: 601 pg/mL (ref 232–1245)

## 2022-01-04 LAB — TSH: TSH: 4.11 u[IU]/mL (ref 0.450–4.500)

## 2022-01-04 LAB — T4, FREE: Free T4: 1.29 ng/dL (ref 0.82–1.77)

## 2022-01-04 NOTE — Telephone Encounter (Signed)
Culture was sent  

## 2022-01-05 ENCOUNTER — Other Ambulatory Visit: Payer: Self-pay | Admitting: Family Medicine

## 2022-01-05 LAB — URINE CULTURE

## 2022-01-05 LAB — SPECIMEN STATUS REPORT

## 2022-01-15 ENCOUNTER — Other Ambulatory Visit: Payer: Self-pay | Admitting: Family Medicine

## 2022-01-16 ENCOUNTER — Encounter: Payer: Self-pay | Admitting: Family Medicine

## 2022-01-16 ENCOUNTER — Ambulatory Visit (INDEPENDENT_AMBULATORY_CARE_PROVIDER_SITE_OTHER): Payer: Medicare Other | Admitting: Family Medicine

## 2022-01-16 VITALS — BP 128/78 | Wt 213.8 lb

## 2022-01-16 DIAGNOSIS — N1832 Chronic kidney disease, stage 3b: Secondary | ICD-10-CM | POA: Diagnosis not present

## 2022-01-16 DIAGNOSIS — R7989 Other specified abnormal findings of blood chemistry: Secondary | ICD-10-CM | POA: Diagnosis not present

## 2022-01-16 MED ORDER — SERTRALINE HCL 50 MG PO TABS: 50.0000 mg | ORAL_TABLET | Freq: Every day | ORAL | 1 refills | Status: DC

## 2022-01-16 MED ORDER — ROSUVASTATIN CALCIUM 10 MG PO TABS: 10.0000 mg | ORAL_TABLET | Freq: Every day | ORAL | 1 refills | Status: DC

## 2022-01-16 MED ORDER — TAMSULOSIN HCL 0.4 MG PO CAPS: ORAL_CAPSULE | ORAL | 3 refills | Status: DC

## 2022-01-16 MED ORDER — MOMETASONE FURO-FORMOTEROL FUM 200-5 MCG/ACT IN AERO: 2.0000 | INHALATION_SPRAY | Freq: Two times a day (BID) | RESPIRATORY_TRACT | 3 refills | Status: DC

## 2022-01-16 NOTE — Patient Instructions (Signed)
Start Ruthe Mannan This is a combination inhaler It will help reduce inflammation in your airways and open up the airways  1 inhalation of the inhaler with a slow deep breath.  Hold the breath for 8 seconds.  Let the breath go.  Breathing normal for 1 minute.  Then repeat the process for the second inhalation. Rinse your mouth with water after use  Do this twice daily  Please send [place] update within 3 to 4 weeks how you are doing  Please follow-up in approximately 3 to 4 months Do blood work with a urine specimen a few days before that visit  Follow-up sooner if any problems.  TakeCare-Dr. Nicki Reaper

## 2022-01-16 NOTE — Progress Notes (Signed)
   Subjective:    Patient ID: Anthony Little, male    DOB: November 08, 1950, 71 y.o.   MRN: [id number]  HPI Pt arrives for follow up. Pt states that his energy has decreased. Having a lot of chest congestion. Coughing up milky colored phelgm. Disorientation has improved. Pt slept in recliner last night due to that way being easier to get up.    Review of Systems     Objective:   Physical Exam General-in no acute distress Eyes-no discharge Lungs-respiratory rate normal, CTA CV-no murmurs,RRR Extremities skin warm dry no edema Neuro grossly normal Behavior normal, alert      Assessment & Plan:  Elevated kidney function We will go ahead and order kidney function as well as urine micro protein  Patient with ongoing chest congestion breathing difficulties add Dulera on a daily basis to see if this helps with the breathing Follow-up in 4 months Recent lab work reviewed B12 looks good thyroid function looks good.  Moods are doing somewhat better.

## 2022-01-19 ENCOUNTER — Institutional Professional Consult (permissible substitution): Payer: Medicare Other | Admitting: Internal Medicine

## 2022-01-26 ENCOUNTER — Institutional Professional Consult (permissible substitution): Payer: Medicare Other | Admitting: Neurology

## 2022-02-07 ENCOUNTER — Encounter: Payer: Self-pay | Admitting: Family Medicine

## 2022-02-07 NOTE — Telephone Encounter (Signed)
Nurses Please see if we can help accommodate Anthony Little with appointment with a open slot this week or early next week

## 2022-02-13 ENCOUNTER — Ambulatory Visit (HOSPITAL_COMMUNITY)
Admission: RE | Admit: 2022-02-13 | Discharge: 2022-02-13 | Disposition: A | Discharge status: Home / Self Care | Payer: Medicare Other | Source: Ambulatory Visit | Attending: Family Medicine | Admitting: Family Medicine

## 2022-02-13 ENCOUNTER — Ambulatory Visit (INDEPENDENT_AMBULATORY_CARE_PROVIDER_SITE_OTHER): Payer: Medicare Other | Admitting: Family Medicine

## 2022-02-13 ENCOUNTER — Other Ambulatory Visit (HOSPITAL_COMMUNITY)
Admission: RE | Admit: 2022-02-13 | Discharge: 2022-02-13 | Disposition: A | Discharge status: Home / Self Care | Payer: Medicare Other | Source: Ambulatory Visit | Attending: Family Medicine | Admitting: Family Medicine

## 2022-02-13 VITALS — BP 120/80 | HR 59 | Temp 97.4°F | Ht 70.0 in | Wt 209.8 lb

## 2022-02-13 DIAGNOSIS — J90 Pleural effusion, not elsewhere classified: Secondary | ICD-10-CM | POA: Diagnosis not present

## 2022-02-13 DIAGNOSIS — R0602 Shortness of breath: Secondary | ICD-10-CM | POA: Diagnosis not present

## 2022-02-13 DIAGNOSIS — I4892 Unspecified atrial flutter: Secondary | ICD-10-CM

## 2022-02-13 DIAGNOSIS — I4821 Permanent atrial fibrillation: Secondary | ICD-10-CM

## 2022-02-13 DIAGNOSIS — R0609 Other forms of dyspnea: Secondary | ICD-10-CM | POA: Insufficient documentation

## 2022-02-13 DIAGNOSIS — I4891 Unspecified atrial fibrillation: Secondary | ICD-10-CM | POA: Insufficient documentation

## 2022-02-13 DIAGNOSIS — R079 Chest pain, unspecified: Secondary | ICD-10-CM | POA: Diagnosis not present

## 2022-02-13 LAB — CBC WITH DIFFERENTIAL/PLATELET
Abs Immature Granulocytes: 0.05 10*3/uL (ref 0.00–0.07)
Basophils Absolute: 0 10*3/uL (ref 0.0–0.1)
Basophils Relative: 1 %
Eosinophils Absolute: 0.1 10*3/uL (ref 0.0–0.5)
Eosinophils Relative: 1 %
HCT: 40.3 % (ref 39.0–52.0)
Hemoglobin: 12.7 g/dL — ABNORMAL LOW (ref 13.0–17.0)
Immature Granulocytes: 1 %
Lymphocytes Relative: 17 %
Lymphs Abs: 1.3 10*3/uL (ref 0.7–4.0)
MCH: 33.6 pg (ref 26.0–34.0)
MCHC: 31.5 g/dL (ref 30.0–36.0)
MCV: 106.6 fL — ABNORMAL HIGH (ref 80.0–100.0)
Monocytes Absolute: 0.4 10*3/uL (ref 0.1–1.0)
Monocytes Relative: 5 %
Neutro Abs: 5.9 10*3/uL (ref 1.7–7.7)
Neutrophils Relative %: 75 %
Platelets: 213 10*3/uL (ref 150–400)
RBC: 3.78 MIL/uL — ABNORMAL LOW (ref 4.22–5.81)
RDW: 13.8 % (ref 11.5–15.5)
WBC: 7.8 10*3/uL (ref 4.0–10.5)
nRBC: 0 % (ref 0.0–0.2)

## 2022-02-13 LAB — COMPREHENSIVE METABOLIC PANEL
ALT: 26 U/L (ref 0–44)
AST: 38 U/L (ref 15–41)
Albumin: 2.8 g/dL — ABNORMAL LOW (ref 3.5–5.0)
Alkaline Phosphatase: 104 U/L (ref 38–126)
Anion gap: 11 (ref 5–15)
BUN: 19 mg/dL (ref 8–23)
CO2: 24 mmol/L (ref 22–32)
Calcium: 8.6 mg/dL — ABNORMAL LOW (ref 8.9–10.3)
Chloride: 102 mmol/L (ref 98–111)
Creatinine, Ser: 1.43 mg/dL — ABNORMAL HIGH (ref 0.61–1.24)
GFR, Estimated: 53 mL/min — ABNORMAL LOW (ref >60)
Glucose, Bld: 111 mg/dL — ABNORMAL HIGH (ref 70–99)
Potassium: 4.9 mmol/L (ref 3.5–5.1)
Sodium: 137 mmol/L (ref 135–145)
Total Bilirubin: 0.4 mg/dL (ref 0.3–1.2)
Total Protein: 7.2 g/dL (ref 6.5–8.1)

## 2022-02-13 LAB — BRAIN NATRIURETIC PEPTIDE: B Natriuretic Peptide: 646 pg/mL — ABNORMAL HIGH (ref 0.0–100.0)

## 2022-02-13 MED ORDER — POTASSIUM CHLORIDE CRYS ER 10 MEQ PO TBCR: 10.0000 meq | EXTENDED_RELEASE_TABLET | Freq: Every day | ORAL | 2 refills | Status: DC

## 2022-02-13 MED ORDER — METOPROLOL SUCCINATE ER 25 MG PO TB24: 25.0000 mg | ORAL_TABLET | Freq: Every day | ORAL | 1 refills | Status: DC

## 2022-02-13 MED ORDER — FUROSEMIDE 20 MG PO TABS: 20.0000 mg | ORAL_TABLET | Freq: Every day | ORAL | 3 refills | Status: DC

## 2022-02-13 NOTE — Progress Notes (Signed)
   Subjective:    Patient ID: Anthony Little, male    DOB: 03/02/1951, 71 y.o.   MRN: [id number]  HPI Patient with significant shortness of breath Feeling rundown feeling fatigued Getting out of breath easily States energy level very low Feels at times like he is going to pass out Denies any high fever chills or sweats    Review of Systems     Objective:   Physical Exam Diminished breath sounds more so on the right than the left in the bases.  Not respiratory distress heart fast slightly irregular extremities no edema       Assessment & Plan:  1. Atrial fibrillation and flutter ([place]) I believe the issue that is going on currently is atrial fibrillation.  He had regular output of metoprolol.  Restart metoprolol.  Follow-up within 2 weeks time.  Lab work ordered today. - PR ELECTROCARDIOGRAM, COMPLETE - DG Chest 2 View - Brain natriuretic peptide - CBC with Differential/Platelet - Comprehensive metabolic panel  2. DOE (dyspnea on exertion) I believe this is related to his atrial fibrillation but need to do chest x-ray - PR ELECTROCARDIOGRAM, COMPLETE - DG Chest 2 View - Brain natriuretic peptide - CBC with Differential/Platelet - Comprehensive metabolic panel  3. Permanent atrial fibrillation (HCC) See above - metoprolol succinate (TOPROL-XL) 25 MG 24 hr tablet; Take 1 tablet (25 mg total) by mouth daily.  Dispense: 90 tablet; Refill: 1

## 2022-02-13 NOTE — Progress Notes (Deleted)
   Subjective:    Patient ID: Anthony Little, male    DOB: Dec 22, 1950, 71 y.o.   MRN: [id number]  HPI Patient arrives to discuss dizziness, SOB, weakness.Patient also states at night he has episodes that feel like anxiety attacks. Patient c/o of bloating and low appetite.     Review of Systems     Objective:   Physical Exam        Assessment & Plan:

## 2022-02-14 ENCOUNTER — Ambulatory Visit (INDEPENDENT_AMBULATORY_CARE_PROVIDER_SITE_OTHER): Payer: Medicare Other | Admitting: Family Medicine

## 2022-02-14 DIAGNOSIS — R0609 Other forms of dyspnea: Secondary | ICD-10-CM

## 2022-02-14 DIAGNOSIS — J90 Pleural effusion, not elsewhere classified: Secondary | ICD-10-CM | POA: Diagnosis not present

## 2022-02-14 DIAGNOSIS — N1832 Chronic kidney disease, stage 3b: Secondary | ICD-10-CM

## 2022-02-14 DIAGNOSIS — R7989 Other specified abnormal findings of blood chemistry: Secondary | ICD-10-CM | POA: Diagnosis not present

## 2022-02-14 DIAGNOSIS — R600 Localized edema: Secondary | ICD-10-CM

## 2022-02-14 NOTE — Progress Notes (Signed)
   Subjective:    Patient ID: Anthony Little, male    DOB: 31-Dec-1950, 71 y.o.   MRN: [id number]  HPI  Patient arrives to discuss recent lab results We went over the lab work Albumin is low, elevated BNP Platelets normal Hemoglobin slightly low Electrolytes look good Pleural effusion We discussed this in detail with him and his wife going over the results plus also what they mean plus also what needs to be done Review of Systems     Objective:   Physical Exam Physical exam was not done today this was a discussion       Assessment & Plan:  1. Pleural effusion Will order interventional radiology left pleural effusion needs to be tapped relatively soon to help him with his breathing him and shortness of breath with activity  We will go ahead with evaluation for effusion possibly due to underlying CHF or possibly cirrhosis or possibly lung issues need to do cytology to exclude cancer, patient had scan within the past 2 months which did not show any lung cancer which is good  2. Elevated brain natriuretic peptide (BNP) level Referral to cardiology for further evaluation Initiate Lasix 20 mg every morning, potassium 10 mill equivalent every morning Will also need echo. 3. Pedal edema Diuretic potassium, repeat lab work next week  4. Stage 3b chronic kidney disease ([place]) Healthy eating stay away from alcohol stay away from anti-inflammatories

## 2022-02-15 ENCOUNTER — Institutional Professional Consult (permissible substitution): Payer: Medicare Other | Admitting: Neurology

## 2022-02-16 NOTE — Addendum Note (Signed)
Addended by: Vicente Males on: 02/16/2022 03:13 PM   Modules accepted: Orders

## 2022-02-16 NOTE — Progress Notes (Signed)
Echo order placed. Pt already seen cardiology

## 2022-02-16 NOTE — Progress Notes (Signed)
Lab orders placed.  

## 2022-02-16 NOTE — Addendum Note (Signed)
Addended by: Vicente Males on: 02/16/2022 03:28 PM   Modules accepted: Orders

## 2022-02-20 ENCOUNTER — Telehealth: Payer: Self-pay | Admitting: *Deleted

## 2022-02-20 NOTE — Telephone Encounter (Signed)
Patient called and stated that he has not been contacted by IR for his thoracentesis yet and was told to let you know if he had not heard anything by Monday

## 2022-02-22 NOTE — Telephone Encounter (Signed)
Nurses-I called the interventional radiology left a message.  I have heard nothing from them.  I did that this week. Please call radiology speak with one of the technicians to relate to them that we have not heard anything for this patient regarding interventional radiology to asked them if there is anything they can do to reach out to interventional radiology to please asked them to get moving on this-patient needs ultrasound-guided thoracentesis for the pleural effusion-it has been ordered they just need to get it done

## 2022-02-23 ENCOUNTER — Ambulatory Visit (HOSPITAL_COMMUNITY)
Admission: RE | Admit: 2022-02-23 | Discharge: 2022-02-23 | Disposition: A | Discharge status: Home / Self Care | Payer: Medicare Other | Source: Ambulatory Visit | Attending: Diagnostic Radiology | Admitting: Diagnostic Radiology

## 2022-02-23 ENCOUNTER — Ambulatory Visit (HOSPITAL_COMMUNITY)
Admission: RE | Admit: 2022-02-23 | Discharge: 2022-02-23 | Disposition: A | Discharge status: Home / Self Care | Payer: Medicare Other | Source: Ambulatory Visit | Attending: Family Medicine | Admitting: Family Medicine

## 2022-02-23 ENCOUNTER — Encounter (HOSPITAL_COMMUNITY): Payer: Self-pay

## 2022-02-23 VITALS — BP 100/75 | HR 92 | Temp 97.5°F | Resp 20

## 2022-02-23 DIAGNOSIS — J90 Pleural effusion, not elsewhere classified: Secondary | ICD-10-CM | POA: Diagnosis not present

## 2022-02-23 LAB — BODY FLUID CELL COUNT WITH DIFFERENTIAL
Eos, Fluid: 0 %
Lymphs, Fluid: 20 %
Monocyte-Macrophage-Serous Fluid: 56 % (ref 50–90)
Neutrophil Count, Fluid: 24 % (ref 0–25)
Total Nucleated Cell Count, Fluid: 332 cu mm (ref 0–1000)

## 2022-02-23 LAB — LACTATE DEHYDROGENASE, PLEURAL OR PERITONEAL FLUID: LD, Fluid: 84 U/L — ABNORMAL HIGH (ref 3–23)

## 2022-02-23 LAB — GRAM STAIN

## 2022-02-23 LAB — PROTEIN, PLEURAL OR PERITONEAL FLUID: Total protein, fluid: 4.3 g/dL

## 2022-02-23 LAB — GLUCOSE, PLEURAL OR PERITONEAL FLUID: Glucose, Fluid: 103 mg/dL

## 2022-02-23 NOTE — Progress Notes (Signed)
Thoracentesis complete no signs of distress. Patient being taken to xray for post thoracentesis chest xray.

## 2022-02-23 NOTE — Telephone Encounter (Signed)
Very good 

## 2022-02-23 NOTE — Procedures (Signed)
PreOperative Dx: LEFT pleural effusion Postoperative Dx: LEFT pleural effusion Procedure:   US guided LEFT thoracentesis Radiologist:  Thornton Papas Anesthesia:  10 ml of 1% lidocaine Specimen:  1.4 L of dark yellow colored fluid EBL:   < 1 ml Complications:  None

## 2022-02-23 NOTE — Telephone Encounter (Signed)
Patient is scheduled 02/23/22(today) at 11am

## 2022-02-27 ENCOUNTER — Other Ambulatory Visit: Payer: Self-pay | Admitting: Family Medicine

## 2022-02-28 ENCOUNTER — Ambulatory Visit (HOSPITAL_COMMUNITY): Admission: RE | Admit: 2022-02-28 | Payer: Medicare Other | Source: Ambulatory Visit

## 2022-02-28 LAB — CULTURE, BODY FLUID W GRAM STAIN -BOTTLE: Culture: NO GROWTH

## 2022-02-28 LAB — CYTOLOGY - NON PAP

## 2022-03-02 LAB — MISC LABCORP TEST (SEND OUT): Labcorp test code: 9985

## 2022-03-03 ENCOUNTER — Ambulatory Visit (HOSPITAL_COMMUNITY)
Admission: RE | Admit: 2022-03-03 | Discharge: 2022-03-03 | Disposition: A | Discharge status: Home / Self Care | Payer: Medicare Other | Source: Ambulatory Visit | Attending: Family Medicine | Admitting: Family Medicine

## 2022-03-03 DIAGNOSIS — R7989 Other specified abnormal findings of blood chemistry: Secondary | ICD-10-CM | POA: Diagnosis not present

## 2022-03-03 DIAGNOSIS — J90 Pleural effusion, not elsewhere classified: Secondary | ICD-10-CM | POA: Diagnosis not present

## 2022-03-03 DIAGNOSIS — R0609 Other forms of dyspnea: Secondary | ICD-10-CM | POA: Diagnosis not present

## 2022-03-03 DIAGNOSIS — R600 Localized edema: Secondary | ICD-10-CM | POA: Diagnosis not present

## 2022-03-03 DIAGNOSIS — N1832 Chronic kidney disease, stage 3b: Secondary | ICD-10-CM | POA: Diagnosis not present

## 2022-03-03 LAB — ECHOCARDIOGRAM COMPLETE
AR max vel: 3.35 cm2
AV Area VTI: 3.38 cm2
AV Area mean vel: 2.95 cm2
AV Mean grad: 1 mmHg
AV Peak grad: 1.7 mmHg
Ao pk vel: 0.64 m/s
Area-P 1/2: 3.85 cm2
MV VTI: 3.28 cm2
S' Lateral: 3.2 cm

## 2022-03-03 MED ORDER — PERFLUTREN LIPID MICROSPHERE
1.0000 mL | INTRAVENOUS | Status: AC | PRN
  Administered 2022-03-03: 6 mL via INTRAVENOUS

## 2022-03-03 NOTE — Progress Notes (Signed)
*  PRELIMINARY RESULTS* Echocardiogram 2D Echocardiogram has been performed.  Anthony Little 03/03/2022, 3:06 PM

## 2022-03-04 LAB — MAGNESIUM: Magnesium: 1.8 mg/dL (ref 1.6–2.3)

## 2022-03-04 LAB — BASIC METABOLIC PANEL
BUN/Creatinine Ratio: 16 (ref 10–24)
BUN: 24 mg/dL (ref 8–27)
CO2: 24 mmol/L (ref 20–29)
Calcium: 9.3 mg/dL (ref 8.6–10.2)
Chloride: 96 mmol/L (ref 96–106)
Creatinine, Ser: 1.53 mg/dL — ABNORMAL HIGH (ref 0.76–1.27)
Glucose: 124 mg/dL — ABNORMAL HIGH (ref 70–99)
Potassium: 5.6 mmol/L — ABNORMAL HIGH (ref 3.5–5.2)
Sodium: 136 mmol/L (ref 134–144)
eGFR: 49 mL/min/{1.73_m2} — ABNORMAL LOW (ref >59)

## 2022-03-09 ENCOUNTER — Ambulatory Visit (INDEPENDENT_AMBULATORY_CARE_PROVIDER_SITE_OTHER): Payer: Medicare Other | Admitting: Family Medicine

## 2022-03-09 VITALS — BP 100/70 | HR 95 | Temp 97.5°F | Wt 202.6 lb

## 2022-03-09 DIAGNOSIS — R400 Somnolence: Secondary | ICD-10-CM

## 2022-03-09 DIAGNOSIS — I4821 Permanent atrial fibrillation: Secondary | ICD-10-CM

## 2022-03-09 DIAGNOSIS — F09 Unspecified mental disorder due to known physiological condition: Secondary | ICD-10-CM

## 2022-03-09 DIAGNOSIS — J90 Pleural effusion, not elsewhere classified: Secondary | ICD-10-CM

## 2022-03-09 DIAGNOSIS — E875 Hyperkalemia: Secondary | ICD-10-CM | POA: Diagnosis not present

## 2022-03-09 NOTE — Progress Notes (Signed)
   Subjective:    Patient ID: Anthony Little, male    DOB: Jul 27, 1950, 71 y.o.   MRN: [id number]  HPI Patient arrives today follow up on lab work and results of thoracentesis.   Daytime somnolence - Plan: Ambulatory referral to Pulmonology, DG Chest 2 View  Hyperkalemia  Pleural effusion - Plan: Ambulatory referral to Pulmonology, DG Chest 2 View  Permanent atrial fibrillation [facility])  Cognitive dysfunction - Plan: MR Brain W Wo Contrast This patient was recently seen in the office for follow-up from thoracentesis.  Unfortunately had quite problematic issues with pleural effusion and shortness of breath.  He has had further cardiology evaluation as well as thoracentesis.  Thoracentesis did not specifically reveal what was going on.  He did have a CT scan of the chest back in October did not show any masses.  Therefore we did not repeat the CT He does have a smoking and alcohol history plus also has some underlying heart related issues as well  Over the past few months he has become more fatigued tired lack of energy and lack of initiative with some spells of forgetfulness of having times where he does not remember exact processes  Given the cognitive changes as well as some forgetfulness as well as underlying risk factors for strokes need to rule out mini strokes  Patient still has shortness of breath with activity but he is also deconditioned.  Not doing much in the way of walking.  We did discuss trying to incorporate some walking into his activities Review of Systems     Objective:   Physical Exam General-in no acute distress Eyes-no discharge Lungs-respiratory rate normal, CTA CV-no murmurs,RRR Extremities skin warm dry no edema Neuro grossly normal Behavior normal, alert        Assessment & Plan:   1. Daytime somnolence Patient having a lot of sleeping during the day falling asleep doing various things raises the question of sleep apnea during the night needs  further evaluation with sleep studies - Ambulatory referral to Pulmonology - DG Chest 2 View  2. Hyperkalemia Slight elevation of potassium low potassium diet discussed.  Will relook at labs again in 55 to 45 days more than likely related to chronic kidney disease stage III  3. Pleural effusion The evaluation of the effusion did not point to any type enough far he is finding yet cannot explain why patient had the pleural effusion could well have been related to his heart. Patient also has history of alcohol and smoking May need CT scan of the chest but will leave this to pulmonary to decide  - Ambulatory referral to Pulmonology - DG Chest 2 View  4. Permanent atrial fibrillation (HCC) Significant ongoing atrial fibrillation continue Eliquis  5. Cognitive dysfunction Mild cognitive dysfunction not able to process things quite as well sometimes repeats himself more withdrawn this is been present over the past several months seems to be a new finding therefore it is recommended to do a MRI to make sure that the patient does not have mini strokes or other related issues that could be causing this problem  Follow-up 4 to 6 weeks

## 2022-03-14 ENCOUNTER — Ambulatory Visit (HOSPITAL_COMMUNITY)
Admission: RE | Admit: 2022-03-14 | Discharge: 2022-03-14 | Disposition: A | Discharge status: Home / Self Care | Payer: Medicare Other | Source: Ambulatory Visit | Attending: Family Medicine | Admitting: Family Medicine

## 2022-03-14 ENCOUNTER — Ambulatory Visit: Payer: Medicare Other | Admitting: Family Medicine

## 2022-03-14 DIAGNOSIS — G3184 Mild cognitive impairment, so stated: Secondary | ICD-10-CM | POA: Diagnosis not present

## 2022-03-14 DIAGNOSIS — F09 Unspecified mental disorder due to known physiological condition: Secondary | ICD-10-CM | POA: Insufficient documentation

## 2022-03-14 DIAGNOSIS — Z8673 Personal history of transient ischemic attack (TIA), and cerebral infarction without residual deficits: Secondary | ICD-10-CM | POA: Diagnosis not present

## 2022-03-14 MED ORDER — GADOBUTROL 1 MMOL/ML IV SOLN
9.0000 mL | Freq: Once | INTRAVENOUS | Status: AC | PRN
  Administered 2022-03-14: 9 mL via INTRAVENOUS

## 2022-03-18 DIAGNOSIS — R404 Transient alteration of awareness: Secondary | ICD-10-CM | POA: Diagnosis not present

## 2022-03-18 DIAGNOSIS — Z743 Need for continuous supervision: Secondary | ICD-10-CM | POA: Diagnosis not present

## 2022-03-18 DIAGNOSIS — I499 Cardiac arrhythmia, unspecified: Secondary | ICD-10-CM | POA: Diagnosis not present

## 2022-03-18 DIAGNOSIS — R6889 Other general symptoms and signs: Secondary | ICD-10-CM | POA: Diagnosis not present

## 2022-04-13 ENCOUNTER — Ambulatory Visit: Payer: Medicare Other | Admitting: Family Medicine

## 2022-04-19 ENCOUNTER — Encounter: Payer: Self-pay | Admitting: Cardiology

## 2022-04-19 ENCOUNTER — Ambulatory Visit: Payer: Medicare Other | Attending: Cardiology | Admitting: Cardiology

## 2022-04-19 VITALS — BP 130/82 | HR 58 | Ht 70.0 in | Wt 206.0 lb

## 2022-04-19 DIAGNOSIS — I4892 Unspecified atrial flutter: Secondary | ICD-10-CM | POA: Diagnosis not present

## 2022-04-19 DIAGNOSIS — I7121 Aneurysm of the ascending aorta, without rupture: Secondary | ICD-10-CM

## 2022-04-19 DIAGNOSIS — I4891 Unspecified atrial fibrillation: Secondary | ICD-10-CM | POA: Diagnosis not present

## 2022-04-19 MED ORDER — APIXABAN 5 MG PO TABS: 5.0000 mg | ORAL_TABLET | Freq: Two times a day (BID) | ORAL | 0 refills | Status: DC

## 2022-04-19 MED ORDER — FUROSEMIDE 20 MG PO TABS: 20.0000 mg | ORAL_TABLET | Freq: Every day | ORAL | 3 refills | Status: DC | PRN

## 2022-04-19 NOTE — Patient Instructions (Addendum)
Medication Instructions:  Your physician has recommended you make the following change in your medication:  Change furosemide 20 mg to daily as needed for swelling Continue other medications the same  Labwork: none  Testing/Procedures: none  Follow-Up: Your physician recommends that you schedule a follow-up appointment in: 6 months  Any Other Special Instructions Will Be Listed Below (If Applicable). Patient Assistance application given. Please bring back with proof of income for you and your wife and 2024 prescription out of pocket expense for you and your wife.   If you need a refill on your cardiac medications before your next appointment, please call your pharmacy.

## 2022-04-19 NOTE — Progress Notes (Signed)
Cardiology Office Note  Date: 04/19/2022   ID: Anthony Little, DOB [birth year], MRN [id number]  PCP:  Kathyrn Drown, MD  Cardiologist:  Rozann Lesches, MD Electrophysiologist:  None   Chief Complaint  Patient presents with   Cardiac follow-up    History of Present Illness: Anthony Little is a 72 y.o. male last seen in February 2023.  He is here for a routine visit.  Reports no palpitations or chest discomfort. I reviewed the chart.  He did have interval diagnosis of a left sided pleural effusion as of December 2023 and ultimately underwent thoracentesis with removal of 1.4 L.  Echocardiogram obtained revealed normal LVEF at 55 to 60%, no major valvular abnormalities.  He was referred to Pulmonary by Dr. Wolfgang Phoenix. States that his shortness of breath significantly improved after thoracentesis.  Has had a residual sense of chest congestion with intermittent cough however.  No reported fevers or chills.  He did stop taking Eliquis due to cost, we are checking into this now in terms of assistance and also samples.  I reviewed the remainder of his medications which are stable.  We did discuss his diuretics and I recommended taking Lasix only as needed instead of every day, no potassium supplement for now.  I reviewed his recent lab work.  Past Medical History:  Diagnosis Date   Abdominal aortic aneurysm (AAA) (HCC)    Atrial fibrillation and flutter (HCC)    CKD (chronic kidney disease) stage 3, GFR 30-59 ml/min (HCC)    CVA (cerebral infarction)    Essential hypertension    Hyperlipidemia    Thoracic aortic aneurysm (HCC)     Current Outpatient Medications  Medication Sig Dispense Refill   buPROPion (WELLBUTRIN XL) 150 MG 24 hr tablet TAKE 1 TABLET BY MOUTH EVERY DAY (Patient taking differently: Take 150 mg by mouth daily.) 90 tablet 2   glucosamine-chondroitin 500-400 MG tablet Take 2 tablets by mouth every morning.     metoprolol succinate (TOPROL-XL) 25 MG 24 hr tablet  Take 1 tablet (25 mg total) by mouth daily. 90 tablet 1   Multiple Vitamin (MULTIVITAMIN WITH MINERALS) TABS tablet Take 1 tablet by mouth daily. 30 tablet 1   rosuvastatin (CRESTOR) 10 MG tablet Take 1 tablet (10 mg total) by mouth daily. 90 tablet 1   sertraline (ZOLOFT) 50 MG tablet Take 1 tablet (50 mg total) by mouth daily. 90 tablet 1   tamsulosin (FLOMAX) 0.4 MG CAPS capsule 1 each evening 90 capsule 3   apixaban (ELIQUIS) 5 MG TABS tablet Take 1 tablet (5 mg total) by mouth 2 (two) times daily. 28 tablet 0   furosemide (LASIX) 20 MG tablet Take 1 tablet (20 mg total) by mouth daily as needed (swelling). 30 tablet 3   mometasone-formoterol (DULERA) 200-5 MCG/ACT AERO Inhale 2 puffs into the lungs 2 (two) times daily. 13 g 3   oxyCODONE-acetaminophen (PERCOCET/ROXICET) 5-325 MG tablet Take 1 tablet by mouth every 6 (six) hours as needed for severe pain. 12 tablet 0   No current facility-administered medications for this visit.   Allergies:  Ivp dye [iodinated contrast media]   ROS: No orthopnea or PND.  Physical Exam: VS:  BP 130/82   Pulse (!) 58   Ht '5\' 10"'$  (1.778 m)   Wt 206 lb (93.4 kg)   SpO2 97%   BMI 29.56 kg/m , BMI Body mass index is 29.56 kg/m.  Wt Readings from Last 3 Encounters:  04/19/22 206 lb (93.4  kg)  03/09/22 202 lb 9.6 oz (91.9 kg)  02/13/22 209 lb 12.8 oz (95.2 kg)    General: Patient appears comfortable at rest. HEENT: Conjunctiva and lids normal. Neck: Supple, no elevated JVP or carotid bruits. Lungs: Decreased breath sounds left base, no egophony. Cardiac: Irregularly irregular, 1/6 systolic murmur, no gallop. Extremities: No pitting edema.  ECG:  An ECG dated 12/16/2021 was personally reviewed today and demonstrated:  Atrial fibrillation.  Recent Labwork: 01/03/2022: TSH 4.110 02/13/2022: ALT 26; AST 38; B Natriuretic Peptide 646.0; Hemoglobin 12.7; Platelets 213 03/03/2022: BUN 24; Creatinine, Ser 1.53; Magnesium 1.8; Potassium 5.6; Sodium 136      Component Value Date/Time   CHOL 173 10/13/2020 1437   TRIG 61 10/13/2020 1437   HDL 97 10/13/2020 1437   CHOLHDL 1.8 10/13/2020 1437   CHOLHDL 2.4 07/30/2012 1215   VLDL 11 07/30/2012 1215   LDLCALC 64 10/13/2020 1437    Other Studies Reviewed Today:  Chest CTA 12/16/2021: IMPRESSION: 1. Small pericardial effusion, which could reflect sequela of pericarditis given clinical presentation. 2. Stable 3.5 cm aneurysm of the distal aortic arch. Recommend annual imaging followup by CTA or MRA. This recommendation follows 2010 ACCF/AHA/AATS/ACR/ASA/SCA/SCAI/SIR/STS/SVM Guidelines for the Diagnosis and Management of Patients with Thoracic Aortic Disease. Circulation.2010; 121: L798-X211. Aortic aneurysm NOS (ICD10-I71.9) 3. No evidence of thoracoabdominal aortic dissection. 4. Distal colonic diverticulosis without diverticulitis. 5. Aortic Atherosclerosis (ICD10-I70.0) and Emphysema (ICD10-J43.9).  Echocardiogram 03/03/2022:  1. Left ventricular ejection fraction, by estimation, is 55 to 60%. The  left ventricle has normal function. The left ventricle has no regional  wall motion abnormalities. There is mild concentric left ventricular  hypertrophy. Left ventricular diastolic  parameters are indeterminate.   2. Right ventricular systolic function is normal. The right ventricular  size is normal. Tricuspid regurgitation signal is inadequate for assessing  PA pressure.   3. Left atrial size was upper normal.   4. The mitral valve is grossly normal. Trivial mitral valve  regurgitation.   5. The aortic valve is tricuspid. There is mild calcification of the  aortic valve. Aortic valve regurgitation is not visualized. Aortic valve  sclerosis/calcification is present, without any evidence of aortic  stenosis. Aortic valve mean gradient  measures 1.0 mmHg.   6. The inferior vena cava is normal in size with <50% respiratory  variability, suggesting right atrial pressure of 8 mmHg.    Assessment and Plan:  1.  Persistent atrial fibrillation/flutter with CHA2DS2-VASc score of 5.  Plan is to continue heart rate control strategy and anticoagulation.  No change in current dose of Toprol-XL.  Looking into cost for Eliquis and providing samples today.  I did review his most recent lab work.  2.  Left pleural effusion status post thoracentesis in December 2023 as noted above.  LVEF remains normal by echocardiogram, 55 to 60% range and no major valvular abnormalities.  He has follow-up pending with [place] [facility] this month.  3.  Asymptomatic thoracic aortic aneurysm, stable at 3.5 cm by chest CTA in October 2023.  4.  Stage IIIb, last creatinine 1.53 and potassium 5.6.  Recommended that he stop potassium and use Lasix only as needed.  Medication Adjustments/Labs and Tests Ordered: Current medicines are reviewed at length with the patient today.  Concerns regarding medicines are outlined above.   Tests Ordered: No orders of the defined types were placed in this encounter.   Medication Changes: Meds ordered this encounter  Medications   furosemide (LASIX) 20 MG tablet    Sig:  Take 1 tablet (20 mg total) by mouth daily as needed (swelling).    Dispense:  30 tablet    Refill:  3    04/19/2022 change in directions   apixaban (ELIQUIS) 5 MG TABS tablet    Sig: Take 1 tablet (5 mg total) by mouth 2 (two) times daily.    Dispense:  28 tablet    Refill:  0    WHQ#[id number] Exp-08/2023    Disposition:  Follow up  6 months.  Signed, Satira Sark, MD, [facility] 04/19/2022 2:38 PM    [facility] [place], [place], [place] [address] Phone: [phone]; Fax: [phone]

## 2022-04-24 DIAGNOSIS — R7989 Other specified abnormal findings of blood chemistry: Secondary | ICD-10-CM | POA: Diagnosis not present

## 2022-04-25 LAB — BASIC METABOLIC PANEL
BUN/Creatinine Ratio: 15 (ref 10–24)
BUN: 22 mg/dL (ref 8–27)
CO2: 20 mmol/L (ref 20–29)
Calcium: 9 mg/dL (ref 8.6–10.2)
Chloride: 101 mmol/L (ref 96–106)
Creatinine, Ser: 1.5 mg/dL — ABNORMAL HIGH (ref 0.76–1.27)
Glucose: 102 mg/dL — ABNORMAL HIGH (ref 70–99)
Potassium: 6.1 mmol/L — ABNORMAL HIGH (ref 3.5–5.2)
Sodium: 139 mmol/L (ref 134–144)
eGFR: 50 mL/min/{1.73_m2} — ABNORMAL LOW (ref >59)

## 2022-04-25 LAB — MICROALBUMIN / CREATININE URINE RATIO
Creatinine, Urine: 111.2 mg/dL
Microalb/Creat Ratio: 43 mg/g creat — ABNORMAL HIGH (ref 0–29)
Microalbumin, Urine: 48.1 ug/mL

## 2022-04-26 ENCOUNTER — Encounter: Payer: Self-pay | Admitting: Family Medicine

## 2022-04-26 ENCOUNTER — Ambulatory Visit: Payer: Medicare Other | Admitting: Family Medicine

## 2022-04-26 ENCOUNTER — Telehealth: Payer: Self-pay | Admitting: Family Medicine

## 2022-04-26 ENCOUNTER — Other Ambulatory Visit: Payer: Self-pay | Admitting: Family Medicine

## 2022-04-26 DIAGNOSIS — E875 Hyperkalemia: Secondary | ICD-10-CM

## 2022-04-26 DIAGNOSIS — R7989 Other specified abnormal findings of blood chemistry: Secondary | ICD-10-CM

## 2022-04-26 MED ORDER — LOKELMA 10 G PO PACK: PACK | ORAL | 1 refills | Status: DC

## 2022-04-26 NOTE — Telephone Encounter (Signed)
Call patient regarding his potassium Left message this evening See MyChart message

## 2022-04-27 NOTE — Telephone Encounter (Signed)
Patient called regarding his potassium That is elevated Dr Nicki Reaper sent in a medication that can be used to help reabsorb potassium He would have to do a repeat metabolic 7 on Monday Also avoid foods high in potassium including bananas, tomatoes, spinach, broccoli, kale Start the medication that I sent in Also consultation with nephrology regarding elevated creatinine and elevated potassium Also move his office visit up into next week with me preferably middle of the week  Patient verbalized understanding. Blood work ordered in [employer]. Referral ordered in [facility]. Patient scheduled 05/03/22 at 3:30 pm with Dr Nicki Reaper

## 2022-04-27 NOTE — Telephone Encounter (Signed)
Nurses Please call patient regarding his potassium That is elevated I sent in a medication that can be used to help reabsorb potassium He would have to do a repeat metabolic 7 on Monday Also avoid foods high in potassium including bananas, tomatoes, spinach, broccoli, kale Start the medication that I sent in Also consultation with nephrology regarding elevated creatinine and elevated potassium Also move his office visit up into next week with me preferably middle of the week thank you

## 2022-05-01 ENCOUNTER — Other Ambulatory Visit (HOSPITAL_COMMUNITY)
Admission: RE | Admit: 2022-05-01 | Discharge: 2022-05-01 | Disposition: A | Discharge status: Home / Self Care | Payer: Medicare Other | Source: Ambulatory Visit | Attending: Family Medicine | Admitting: Family Medicine

## 2022-05-01 ENCOUNTER — Other Ambulatory Visit: Payer: Self-pay | Admitting: *Deleted

## 2022-05-01 DIAGNOSIS — E875 Hyperkalemia: Secondary | ICD-10-CM

## 2022-05-01 LAB — BASIC METABOLIC PANEL
Anion gap: 11 (ref 5–15)
BUN: 21 mg/dL (ref 8–23)
CO2: 23 mmol/L (ref 22–32)
Calcium: 8.9 mg/dL (ref 8.9–10.3)
Chloride: 102 mmol/L (ref 98–111)
Creatinine, Ser: 1.45 mg/dL — ABNORMAL HIGH (ref 0.61–1.24)
GFR, Estimated: 52 mL/min — ABNORMAL LOW (ref >60)
Glucose, Bld: 95 mg/dL (ref 70–99)
Potassium: 4.7 mmol/L (ref 3.5–5.1)
Sodium: 136 mmol/L (ref 135–145)

## 2022-05-03 ENCOUNTER — Ambulatory Visit (INDEPENDENT_AMBULATORY_CARE_PROVIDER_SITE_OTHER): Payer: Medicare Other | Admitting: Family Medicine

## 2022-05-03 VITALS — BP 136/86 | Wt 207.6 lb

## 2022-05-03 DIAGNOSIS — N1832 Chronic kidney disease, stage 3b: Secondary | ICD-10-CM

## 2022-05-03 DIAGNOSIS — R0609 Other forms of dyspnea: Secondary | ICD-10-CM

## 2022-05-03 DIAGNOSIS — E875 Hyperkalemia: Secondary | ICD-10-CM

## 2022-05-03 DIAGNOSIS — R400 Somnolence: Secondary | ICD-10-CM

## 2022-05-03 NOTE — Progress Notes (Signed)
   Subjective:    Patient ID: Anthony Little, male    DOB: 1950-07-01, 72 y.o.   MRN: [id number]  HPI Patient arrives today for follow up. Patient states that appointment was from elevated potassium which now is normal. Patient states he is trying to eat a diet low in potassium He does have underlying health issues including COPD dyspnea as well as chronic cough Has a history of smoking is trying to cut back Encourage him to quit He also has a history of alcohol use but he states he has cut it down to just 2 drinks per week He does have CKD Patient is concerned about ongoing chest and nasal congestion going on for a couple months.        Review of Systems     Objective:   Physical Exam  General-in no acute distress Eyes-no discharge Lungs-respiratory rate normal, CTA CV-no murmurs,RRR Extremities skin warm dry no edema Neuro grossly normal Behavior normal, alert       Assessment & Plan:  Recent MRI showed chronic small vessel changes patient feels he is doing better cognitively.  More focus more attentive  1. Hyperkalemia This is now corrected, it is possible this could have been related to blood drawing technique but it is also possible it could have been hyperkalemia associated with his CKD he is trying to adhere to a low potassium diet We did do a repeat metabolic 7 that came back looking good. 2. Daytime somnolence He will be seen pulmonologist later this week for evaluation of emphysema but also evaluation of daytime somnolence may well need sleep study  3. DOE (dyspnea on exertion) Continue inhalers.  We will see what pulmonologist has to say.  Doubting that this is coronary artery disease but that is within the possibilities if it is felt that the lung is not the main culprit  4. Stage 3b chronic kidney disease [facility]) Referral to Dr. Theador Hawthorne  Follow-up office visit later in April

## 2022-05-05 ENCOUNTER — Ambulatory Visit (HOSPITAL_COMMUNITY)
Admission: RE | Admit: 2022-05-05 | Discharge: 2022-05-05 | Disposition: A | Discharge status: Home / Self Care | Payer: Medicare Other | Source: Ambulatory Visit | Attending: Pulmonary Disease | Admitting: Pulmonary Disease

## 2022-05-05 ENCOUNTER — Encounter: Payer: Self-pay | Admitting: Pulmonary Disease

## 2022-05-05 ENCOUNTER — Ambulatory Visit: Payer: Medicare Other | Admitting: Pulmonary Disease

## 2022-05-05 VITALS — BP 124/82 | HR 76 | Ht 70.0 in | Wt 207.0 lb

## 2022-05-05 DIAGNOSIS — J90 Pleural effusion, not elsewhere classified: Secondary | ICD-10-CM | POA: Insufficient documentation

## 2022-05-05 DIAGNOSIS — J432 Centrilobular emphysema: Secondary | ICD-10-CM

## 2022-05-05 DIAGNOSIS — J301 Allergic rhinitis due to pollen: Secondary | ICD-10-CM | POA: Diagnosis not present

## 2022-05-05 DIAGNOSIS — Z720 Tobacco use: Secondary | ICD-10-CM | POA: Diagnosis not present

## 2022-05-05 DIAGNOSIS — R0683 Snoring: Secondary | ICD-10-CM

## 2022-05-05 MED ORDER — AZELASTINE HCL 0.15 % NA SOLN: 1.0000 | Freq: Every morning | NASAL | 5 refills | Status: DC

## 2022-05-05 MED ORDER — FLUTICASONE PROPIONATE 50 MCG/ACT NA SUSP: 1.0000 | Freq: Every evening | NASAL | 2 refills | Status: DC

## 2022-05-05 NOTE — Patient Instructions (Signed)
Will arrange for home sleep study  Chest xray today at [place] 1 spray each nostril in the morning and flonase 1 spray in nostril in the evening  Albuterol two puffs every 6 hours as needed for cough, wheeze, or chest congestion  Follow up in 8 weeks

## 2022-05-05 NOTE — Progress Notes (Signed)
[facility]  Chief Complaint  Patient presents with   Consult    Consult for daytime sleepiness and pleural effusion. States that he doesn't snore but wakes feeling like he is having anxiety attacks.     Past Surgical History:  He  has a past surgical history that includes Colonoscopy (12/2009); Esophagogastroduodenoscopy (N/A, 10/08/2012); Colonoscopy (N/A, 11/01/2015); Esophagogastroduodenoscopy (N/A, 11/01/2015); Colonoscopy with propofol (N/A, 08/17/2020); polypectomy (08/17/2020); biopsy (08/17/2020); Esophagogastroduodenoscopy (egd) with propofol (N/A, 02/21/2021); and biopsy (02/21/2021).  Past Medical History:  AAA, A fib, CKD 3b, CVA, HTN, HLD  Constitutional:  BP 124/82   Pulse 76   Ht '5\' 10"'$  (1.778 m)   Wt 207 lb (93.9 kg)   SpO2 94%   BMI 29.70 kg/m   Brief Summary:  Anthony Little is a 72 y.o. male with       Subjective:   He has noticed trouble with his breathing and has a cough.  He was started on an inhaler.  This didn't help much.  He was found to have new left pleural effusion.  Thoracentesis results were more consistent with a transudate.  He is followed by cardiology for diastolic CHF and is to have nephrology assessment for CKD.    He has sinus congestion with post nasal drip and this triggers his cough.  He does okay with activity at  steady pace on level ground.  Denies chest pain, wheeze, or hemoptysis.  Not having joint or leg swelling  He is from [place], but has lived in [place] since the 1990's.  He used to smoke up to 2 ppd, but now is down to a couple cigarettes per day.  He had pneumonia in 2023.  No history of TB.  He was never told he has asthma.  He gets mild allergies in the Spring.  He has a Programmer, systems.  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  PFT showed bronchodilator response, air trapping, and mild diffusion  defect.  He snores and wakes up feeling like he can't breath.  This makes him feel panicked.  He is a restless sleeper.  He feels sleepy and tired during the day.  He has more trouble sleeping on his back.  Physical Exam:   Appearance - well kempt   ENMT - no sinus tenderness, no oral exudate, no LAN, Mallampati 3 airway, no stridor  Respiratory - equal breath sounds bilaterally, no wheezing or rales  CV - s1s2 regular rate and rhythm, no murmurs  Ext - no clubbing, no edema  Skin - no rashes  Psych - normal mood and affect   Pulmonary testing:  PFT 08/30/21 >> FEV1 2.44 (74%), FEV1% 67, TLC 6.62 (94%), RV 3.12 (127%), DLCO 69%, +BD Lt thoracentesis 02/23/22 >> 1.4 liters, glucose 103, LDH 84, protein 4.3, WBC 332 (35M, 24N, 20L), cytology and culture negative  Chest Imaging:  CT angio chest 12/16/21 >> moderate centrilobular and paraseptal emphysema, stable and benign 4 mm LLL nodule  Sleep Tests:    Cardiac Tests:  Echo 03/03/22 >> EF 55 to 60%, mild LVH  Social History:  He  reports that he has been smoking cigarettes. He started smoking about 6 years ago. He has been smoking an average of .5 packs per day. He has never used smokeless tobacco. He reports current alcohol use of about 4.0 standard drinks of alcohol per week. He reports  that he does not use drugs.  Family History:  His family history includes Diabetes in his maternal grandfather and mother; Parkinson's disease in his father.     Assessment/Plan:   COPD with emphysema. - radiographic findings seem more prominent compared to symptoms and PFT findings - he didn't feel like dulera helped - will have him use albuterol as needed and monitor his symptoms - defer additional maintenance inhaler therapy for now  Tobacco abuse. - he is making progress with smoking cessation - discussed options to help him quit smoking  Snoring. - associated with sleep disruption, apnea, and daytime sleepiness - he has  history of hypertension and CVA - I am concerned he could have obstructive sleep apnea - will arrange for home sleep study to assess further  Transudate left pleural effusion. - most likely in setting of heart failure and CKD - repeat chest xray today  Allergic rhinitis. - will have him use astepro in the morning and flonase at night  Persistent atrial fibrillation, Thoracic aortic aneurysm. - followed by Dr. Rozann Lesches with cardiology  Time Spent Involved in Patient Care on Day of Examination:  66 minutes  Follow up:   Patient Instructions  Will arrange for home sleep study  Chest xray today at [place] 1 spray each nostril in the morning and flonase 1 spray in nostril in the evening  Albuterol two puffs every 6 hours as needed for cough, wheeze, or chest congestion  Follow up in 8 weeks  Medication List:   Allergies as of 05/05/2022       Reactions   Ivp Dye [iodinated Contrast Media] Nausea And Vomiting        Medication List        Accurate as of May 05, 2022  9:41 AM. If you have any questions, ask your nurse or doctor.          STOP taking these medications    mometasone-formoterol 200-5 MCG/ACT Aero Commonly known as: DULERA Stopped by: Chesley Mires, MD   oxyCODONE-acetaminophen 5-325 MG tablet Commonly known as: PERCOCET/ROXICET Stopped by: Chesley Mires, MD       TAKE these medications    albuterol 108 (90 Base) MCG/ACT inhaler Commonly known as: VENTOLIN HFA Inhale 2 puffs into the lungs every 6 (six) hours as needed for wheezing or shortness of breath.   apixaban 5 MG Tabs tablet Commonly known as: ELIQUIS Take 1 tablet (5 mg total) by mouth 2 (two) times daily.   Azelastine HCl 0.15 % Soln Commonly known as: Astepro Place 1 spray into the nose every morning. Started by: Chesley Mires, MD   buPROPion 150 MG 24 hr tablet Commonly known as: WELLBUTRIN XL TAKE 1 TABLET BY MOUTH EVERY DAY   fluticasone 50 MCG/ACT  nasal spray Commonly known as: FLONASE Place 1 spray into both nostrils at bedtime. Started by: Chesley Mires, MD   furosemide 20 MG tablet Commonly known as: LASIX Take 1 tablet (20 mg total) by mouth daily as needed (swelling).   glucosamine-chondroitin 500-400 MG tablet Take 2 tablets by mouth every morning.   metoprolol succinate 25 MG 24 hr tablet Commonly known as: TOPROL-XL Take 1 tablet (25 mg total) by mouth daily.   multivitamin with minerals Tabs tablet Take 1 tablet by mouth daily.   rosuvastatin 10 MG tablet Commonly known as: Crestor Take 1 tablet (10 mg total) by mouth daily.   sertraline 50 MG tablet Commonly known as: ZOLOFT Take 1 tablet (50  mg total) by mouth daily.   tamsulosin 0.4 MG Caps capsule Commonly known as: FLOMAX 1 each evening        Signature:  Chesley Mires, MD [place] Pager - [phone] 05/05/2022, 9:41 AM

## 2022-05-11 ENCOUNTER — Other Ambulatory Visit: Payer: Self-pay | Admitting: Family Medicine

## 2022-05-12 ENCOUNTER — Other Ambulatory Visit: Payer: Self-pay | Admitting: Family Medicine

## 2022-05-16 ENCOUNTER — Ambulatory Visit: Payer: Medicare Other | Admitting: Family Medicine

## 2022-05-17 ENCOUNTER — Ambulatory Visit: Payer: Medicare Other | Admitting: Family Medicine

## 2022-05-31 ENCOUNTER — Telehealth: Payer: Self-pay | Admitting: *Deleted

## 2022-05-31 ENCOUNTER — Other Ambulatory Visit: Payer: Self-pay | Admitting: Family Medicine

## 2022-05-31 ENCOUNTER — Other Ambulatory Visit: Payer: Self-pay | Admitting: Cardiology

## 2022-05-31 DIAGNOSIS — I77811 Abdominal aortic ectasia: Secondary | ICD-10-CM

## 2022-05-31 DIAGNOSIS — I4821 Permanent atrial fibrillation: Secondary | ICD-10-CM

## 2022-05-31 NOTE — Telephone Encounter (Signed)
This is in reminder file from 5 years ago:    [place] of Abd aorta screening AAA.Atherosclerotic change   Does patient still need this with recent chest scans and follow up?

## 2022-05-31 NOTE — Telephone Encounter (Signed)
Patient had CT scan 2 years ago and did not have any aneurysm but because of aortic ectasia I would recommend ultrasound of the aorta in the abdomen to be on the safe side since has been 2 years

## 2022-06-01 NOTE — Telephone Encounter (Signed)
Pt last saw Dr Domenic Polite 04/19/22, last labs 05/01/22 Creat 1.45, age 72, weight 93.9kg, based on specified criteria pt is on appropriate dosage of Eliquis 5mg  BID for afib.  Will refill rx.

## 2022-06-02 NOTE — Telephone Encounter (Signed)
Patient called and left message to call office. (Ordered in [facility] Aortic US scheduled for June 20, 2022 appointment 0930, patient to arrive at 0815 NPO after midnight at [facility])

## 2022-06-02 NOTE — Telephone Encounter (Signed)
US Aorta is for April 9th 2024 at [place].  (NOT APRIL 2nd) Patient to arrive at [place]. Patient is be NPO for test.

## 2022-06-02 NOTE — Telephone Encounter (Signed)
Patient called and left message to call office. (Ordered in [facility] Aortic US scheduled for June 13, 2022 patient to arrive at 0815 NPO after midnight at [facility])

## 2022-06-02 NOTE — Telephone Encounter (Signed)
Called patient

## 2022-06-13 ENCOUNTER — Encounter: Payer: Self-pay | Admitting: Family Medicine

## 2022-06-13 ENCOUNTER — Ambulatory Visit (INDEPENDENT_AMBULATORY_CARE_PROVIDER_SITE_OTHER): Payer: Medicare Other | Admitting: Family Medicine

## 2022-06-13 VITALS — BP 134/86 | HR 104 | Temp 97.8°F | Ht 70.0 in | Wt 209.0 lb

## 2022-06-13 DIAGNOSIS — D6489 Other specified anemias: Secondary | ICD-10-CM

## 2022-06-13 DIAGNOSIS — F1020 Alcohol dependence, uncomplicated: Secondary | ICD-10-CM

## 2022-06-13 DIAGNOSIS — J301 Allergic rhinitis due to pollen: Secondary | ICD-10-CM

## 2022-06-13 DIAGNOSIS — D692 Other nonthrombocytopenic purpura: Secondary | ICD-10-CM | POA: Diagnosis not present

## 2022-06-13 DIAGNOSIS — R040 Epistaxis: Secondary | ICD-10-CM | POA: Diagnosis not present

## 2022-06-13 MED ORDER — IPRATROPIUM BROMIDE 0.06 % NA SOLN: 2.0000 | Freq: Four times a day (QID) | NASAL | 12 refills | Status: DC

## 2022-06-13 NOTE — Progress Notes (Signed)
   Subjective:    Patient ID: Anthony Little, male    DOB: [birth year], 72 y.o.   MRN: [id number]  HPI Nasal congestion, nose bleeds, headaches mainly R side Productive cough Patient with nosebleeds some intermittent head congestion drainage coughing denies wheezing denies shortness of breath energy level subpar no sweats   Review of Systems     Objective:   Physical Exam Both nares are overall looking with some evidence of previous bleeding bilateral patient also has senile purpura lungs clear heart regular   Senile purpura noted    Assessment & Plan:  1. Epistaxis Proper way to stop nosebleed was shown If further troubles referral to ENT 2. Allergic rhinitis due to pollen, unspecified seasonality Atrovent nasal spray, OTC allergy medicines, stay away from Flonase, Senile purpura related to fragile skin as well as Eliquis Anemia related alcoholism but not severe monitor

## 2022-06-14 NOTE — Telephone Encounter (Signed)
Patient was made aware of the ultrasound

## 2022-06-20 ENCOUNTER — Ambulatory Visit (HOSPITAL_COMMUNITY)
Admission: RE | Admit: 2022-06-20 | Discharge: 2022-06-20 | Disposition: A | Discharge status: Home / Self Care | Payer: Medicare Other | Source: Ambulatory Visit | Attending: Family Medicine | Admitting: Family Medicine

## 2022-06-20 DIAGNOSIS — I77811 Abdominal aortic ectasia: Secondary | ICD-10-CM | POA: Diagnosis not present

## 2022-07-01 ENCOUNTER — Emergency Department (HOSPITAL_COMMUNITY)
Admission: EM | Admit: 2022-07-01 | Discharge: 2022-07-02 | Disposition: A | Discharge status: Home / Self Care | Payer: Medicare Other | Attending: Emergency Medicine | Admitting: Emergency Medicine

## 2022-07-01 ENCOUNTER — Emergency Department (HOSPITAL_COMMUNITY): Payer: Medicare Other

## 2022-07-01 ENCOUNTER — Other Ambulatory Visit: Payer: Self-pay

## 2022-07-01 DIAGNOSIS — E860 Dehydration: Secondary | ICD-10-CM | POA: Insufficient documentation

## 2022-07-01 DIAGNOSIS — J90 Pleural effusion, not elsewhere classified: Secondary | ICD-10-CM | POA: Diagnosis not present

## 2022-07-01 DIAGNOSIS — Z79899 Other long term (current) drug therapy: Secondary | ICD-10-CM | POA: Diagnosis not present

## 2022-07-01 DIAGNOSIS — J9811 Atelectasis: Secondary | ICD-10-CM | POA: Diagnosis not present

## 2022-07-01 DIAGNOSIS — N179 Acute kidney failure, unspecified: Secondary | ICD-10-CM | POA: Diagnosis not present

## 2022-07-01 DIAGNOSIS — M25522 Pain in left elbow: Secondary | ICD-10-CM | POA: Diagnosis not present

## 2022-07-01 DIAGNOSIS — W01198A Fall on same level from slipping, tripping and stumbling with subsequent striking against other object, initial encounter: Secondary | ICD-10-CM | POA: Diagnosis not present

## 2022-07-01 DIAGNOSIS — S3991XA Unspecified injury of abdomen, initial encounter: Secondary | ICD-10-CM | POA: Insufficient documentation

## 2022-07-01 DIAGNOSIS — I4891 Unspecified atrial fibrillation: Secondary | ICD-10-CM | POA: Insufficient documentation

## 2022-07-01 DIAGNOSIS — I129 Hypertensive chronic kidney disease with stage 1 through stage 4 chronic kidney disease, or unspecified chronic kidney disease: Secondary | ICD-10-CM | POA: Insufficient documentation

## 2022-07-01 DIAGNOSIS — S299XXA Unspecified injury of thorax, initial encounter: Secondary | ICD-10-CM | POA: Diagnosis not present

## 2022-07-01 DIAGNOSIS — M25562 Pain in left knee: Secondary | ICD-10-CM | POA: Insufficient documentation

## 2022-07-01 DIAGNOSIS — Z043 Encounter for examination and observation following other accident: Secondary | ICD-10-CM | POA: Diagnosis not present

## 2022-07-01 DIAGNOSIS — Z7901 Long term (current) use of anticoagulants: Secondary | ICD-10-CM | POA: Diagnosis not present

## 2022-07-01 DIAGNOSIS — N183 Chronic kidney disease, stage 3 unspecified: Secondary | ICD-10-CM | POA: Insufficient documentation

## 2022-07-01 DIAGNOSIS — Y92000 Kitchen of unspecified non-institutional (private) residence as  the place of occurrence of the external cause: Secondary | ICD-10-CM | POA: Insufficient documentation

## 2022-07-01 DIAGNOSIS — R0902 Hypoxemia: Secondary | ICD-10-CM | POA: Diagnosis not present

## 2022-07-01 DIAGNOSIS — S0990XA Unspecified injury of head, initial encounter: Secondary | ICD-10-CM | POA: Diagnosis not present

## 2022-07-01 DIAGNOSIS — I499 Cardiac arrhythmia, unspecified: Secondary | ICD-10-CM | POA: Diagnosis not present

## 2022-07-01 DIAGNOSIS — S199XXA Unspecified injury of neck, initial encounter: Secondary | ICD-10-CM | POA: Diagnosis not present

## 2022-07-01 DIAGNOSIS — I6782 Cerebral ischemia: Secondary | ICD-10-CM | POA: Diagnosis not present

## 2022-07-01 DIAGNOSIS — Y906 Blood alcohol level of 120-199 mg/100 ml: Secondary | ICD-10-CM | POA: Diagnosis not present

## 2022-07-01 DIAGNOSIS — W19XXXA Unspecified fall, initial encounter: Secondary | ICD-10-CM

## 2022-07-01 DIAGNOSIS — R58 Hemorrhage, not elsewhere classified: Secondary | ICD-10-CM | POA: Diagnosis not present

## 2022-07-01 DIAGNOSIS — S59902A Unspecified injury of left elbow, initial encounter: Secondary | ICD-10-CM | POA: Diagnosis not present

## 2022-07-01 DIAGNOSIS — J439 Emphysema, unspecified: Secondary | ICD-10-CM | POA: Diagnosis not present

## 2022-07-01 DIAGNOSIS — K5730 Diverticulosis of large intestine without perforation or abscess without bleeding: Secondary | ICD-10-CM | POA: Diagnosis not present

## 2022-07-01 DIAGNOSIS — R6889 Other general symptoms and signs: Secondary | ICD-10-CM | POA: Diagnosis not present

## 2022-07-01 DIAGNOSIS — Z743 Need for continuous supervision: Secondary | ICD-10-CM | POA: Diagnosis not present

## 2022-07-01 LAB — COMPREHENSIVE METABOLIC PANEL
ALT: 30 U/L (ref 0–44)
AST: 44 U/L — ABNORMAL HIGH (ref 15–41)
Albumin: 3.3 g/dL — ABNORMAL LOW (ref 3.5–5.0)
Alkaline Phosphatase: 88 U/L (ref 38–126)
Anion gap: 12 (ref 5–15)
BUN: 24 mg/dL — ABNORMAL HIGH (ref 8–23)
CO2: 20 mmol/L — ABNORMAL LOW (ref 22–32)
Calcium: 8.6 mg/dL — ABNORMAL LOW (ref 8.9–10.3)
Chloride: 102 mmol/L (ref 98–111)
Creatinine, Ser: 1.84 mg/dL — ABNORMAL HIGH (ref 0.61–1.24)
GFR, Estimated: 39 mL/min — ABNORMAL LOW (ref >60)
Glucose, Bld: 83 mg/dL (ref 70–99)
Potassium: 3.7 mmol/L (ref 3.5–5.1)
Sodium: 134 mmol/L — ABNORMAL LOW (ref 135–145)
Total Bilirubin: 0.6 mg/dL (ref 0.3–1.2)
Total Protein: 6.8 g/dL (ref 6.5–8.1)

## 2022-07-01 LAB — SAMPLE TO BLOOD BANK

## 2022-07-01 LAB — I-STAT CHEM 8, ED
BUN: 31 mg/dL — ABNORMAL HIGH (ref 8–23)
Calcium, Ion: 1.06 mmol/L — ABNORMAL LOW (ref 1.15–1.40)
Chloride: 102 mmol/L (ref 98–111)
Creatinine, Ser: 2.1 mg/dL — ABNORMAL HIGH (ref 0.61–1.24)
Glucose, Bld: 80 mg/dL (ref 70–99)
HCT: 39 % (ref 39.0–52.0)
Hemoglobin: 13.3 g/dL (ref 13.0–17.0)
Potassium: 3.8 mmol/L (ref 3.5–5.1)
Sodium: 135 mmol/L (ref 135–145)
TCO2: 21 mmol/L — ABNORMAL LOW (ref 22–32)

## 2022-07-01 LAB — PROTIME-INR
INR: 1.2 (ref 0.8–1.2)
Prothrombin Time: 15.3 seconds — ABNORMAL HIGH (ref 11.4–15.2)

## 2022-07-01 LAB — CBC
HCT: 36.1 % — ABNORMAL LOW (ref 39.0–52.0)
Hemoglobin: 12.1 g/dL — ABNORMAL LOW (ref 13.0–17.0)
MCH: 35.1 pg — ABNORMAL HIGH (ref 26.0–34.0)
MCHC: 33.5 g/dL (ref 30.0–36.0)
MCV: 104.6 fL — ABNORMAL HIGH (ref 80.0–100.0)
Platelets: 109 10*3/uL — ABNORMAL LOW (ref 150–400)
RBC: 3.45 MIL/uL — ABNORMAL LOW (ref 4.22–5.81)
RDW: 14.6 % (ref 11.5–15.5)
WBC: 5.8 10*3/uL (ref 4.0–10.5)
nRBC: 0 % (ref 0.0–0.2)

## 2022-07-01 LAB — ETHANOL: Alcohol, Ethyl (B): 186 mg/dL — ABNORMAL HIGH (ref <10)

## 2022-07-01 LAB — LACTIC ACID, PLASMA: Lactic Acid, Venous: 3.5 mmol/L (ref 0.5–1.9)

## 2022-07-01 MED ORDER — IOHEXOL 350 MG/ML SOLN
75.0000 mL | Freq: Once | INTRAVENOUS | Status: AC | PRN
  Administered 2022-07-01: 75 mL via INTRAVENOUS

## 2022-07-01 MED ORDER — SODIUM CHLORIDE 0.9 % IV BOLUS
500.0000 mL | Freq: Once | INTRAVENOUS | Status: AC
  Administered 2022-07-01: 500 mL via INTRAVENOUS

## 2022-07-01 MED ORDER — SODIUM CHLORIDE 0.9 % BOLUS PEDS: 500.0000 mL | Freq: Once | INTRAVENOUS | Status: DC

## 2022-07-01 NOTE — ED Provider Notes (Incomplete)
[place] [facility]PARTMENT AT [facility] Provider Note   CSN: [id number] Arrival date & time: 07/01/22  2153     History {Add pertinent medical, surgical, social history, OB history to HPI:1} Chief Complaint  Patient presents with  . Fall on Thinners    Anthony Little is a 72 y.o. male with HTN, A-fib on Eliquis, CKD stage III, HLD who presents as level one trauma fall on thinners. Patient reports he was standing in the kitchen and *** .   HPI     Home Medications Prior to Admission medications   Medication Sig Start Date End Date Taking? Authorizing Provider  albuterol (VENTOLIN HFA) 108 (90 Base) MCG/ACT inhaler Inhale 2 puffs into the lungs every 6 (six) hours as needed for wheezing or shortness of breath.    [provider]  Azelastine HCl (ASTEPRO) 0.15 % SOLN Place 1 spray into the nose every morning. 05/05/22   Coralyn Helling, MD  buPROPion (WELLBUTRIN XL) 150 MG 24 hr tablet TAKE 1 TABLET BY MOUTH EVERY DAY Patient taking differently: Take 150 mg by mouth daily. 01/26/21   Babs Sciara, MD  ELIQUIS 5 MG TABS tablet TAKE 1 TABLET BY MOUTH TWICE A DAY 06/01/22   Jonelle Sidle, MD  fluticasone [facility]) 50 MCG/ACT nasal spray Place 1 spray into both nostrils at bedtime. 05/05/22   Coralyn Helling, MD  furosemide (LASIX) 20 MG tablet TAKE 1 TABLET BY MOUTH EVERY DAY 05/16/22   Babs Sciara, MD  glucosamine-chondroitin 500-400 MG tablet Take 2 tablets by mouth every morning.    [provider]  ipratropium (ATROVENT) 0.06 % nasal spray Place 2 sprays into both nostrils 4 (four) times daily. 06/13/22   Babs Sciara, MD  metoprolol succinate (TOPROL-XL) 25 MG 24 hr tablet TAKE 1 TABLET (25 MG TOTAL) BY MOUTH DAILY. 06/01/22   Babs Sciara, MD  Multiple Vitamin (MULTIVITAMIN WITH MINERALS) TABS tablet Take 1 tablet by mouth daily. 02/23/21   Johnson, Clanford L, MD  rosuvastatin (CRESTOR) 10 MG tablet Take 1 tablet (10 mg total) by mouth  daily. 01/16/22   Babs Sciara, MD  sertraline (ZOLOFT) 50 MG tablet Take 1 tablet (50 mg total) by mouth daily. 01/16/22   Babs Sciara, MD  tamsulosin (FLOMAX) 0.4 MG CAPS capsule 1 each evening 01/16/22   Babs Sciara, MD      Allergies    Ivp dye [iodinated contrast media]    Review of Systems   Review of Systems Review of systems {pos/neg:18640::"Negative","Positive"} for ***.  A 10 point review of systems was performed and is negative unless otherwise reported in HPI.  Physical Exam Updated Vital Signs BP 104/69   Pulse 79   Temp (!) 97.5 F (36.4 C) (Oral)   Resp 19   Ht 5\' 10"  (1.778 m)   Wt 93.4 kg   SpO2 100%   BMI 29.56 kg/m  Physical Exam General: Normal appearing {Desc; male/male:11659}, lying in bed.  HEENT: PERRLA, Sclera anicteric, MMM, trachea midline.  Cardiology: RRR, no murmurs/rubs/gallops. BL radial and DP pulses equal bilaterally.  Resp: Normal respiratory rate and effort. CTAB, no wheezes, rhonchi, crackles.  Abd: Soft, non-tender, non-distended. No rebound tenderness or guarding.  GU: Deferred. MSK: No peripheral edema or signs of trauma. Extremities without deformity or TTP. No cyanosis or clubbing. Skin: warm, dry. No rashes or lesions. Back: No CVA tenderness Neuro: A&Ox4, CNs II-XII grossly intact. MAEs. Sensation grossly intact.  Psych: Normal  mood and affect.   ED Results / Procedures / Treatments   Labs (all labs ordered are listed, but only abnormal results are displayed) Labs Reviewed  CBC - Abnormal; Notable for the following components:      Result Value   RBC 3.45 (*)    Hemoglobin 12.1 (*)    HCT 36.1 (*)    MCV 104.6 (*)    MCH 35.1 (*)    Platelets 109 (*)    All other components within normal limits  PROTIME-INR - Abnormal; Notable for the following components:   Prothrombin Time 15.3 (*)    All other components within normal limits  I-STAT CHEM 8, ED - Abnormal; Notable for the following components:   BUN 31 (*)     Creatinine, Ser 2.10 (*)    Calcium, Ion 1.06 (*)    TCO2 21 (*)    All other components within normal limits  COMPREHENSIVE METABOLIC PANEL  ETHANOL  URINALYSIS, ROUTINE W REFLEX MICROSCOPIC  LACTIC ACID, PLASMA  SAMPLE TO BLOOD BANK    EKG None  Radiology CT CHEST ABDOMEN PELVIS W CONTRAST  Result Date: 07/01/2022 CLINICAL DATA:  Polytrauma, blunt.  Fall. EXAM: CT CHEST, ABDOMEN, AND PELVIS WITH CONTRAST TECHNIQUE: Multidetector CT imaging of the chest, abdomen and pelvis was performed following the standard protocol during bolus administration of intravenous contrast. RADIATION DOSE REDUCTION: This exam was performed according to the departmental dose-optimization program which includes automated exposure control, adjustment of the mA and/or kV according to patient size and/or use of iterative reconstruction technique. CONTRAST:  75mL OMNIPAQUE IOHEXOL 350 MG/ML SOLN COMPARISON:  12/16/2021 FINDINGS: CT CHEST FINDINGS Cardiovascular: Heart is normal size. Aorta normal caliber. No dissection. Scattered coronary artery and aortic calcifications. Mediastinum/Nodes: No mediastinal, hilar, or axillary adenopathy. Trachea and esophagus are unremarkable. Thyroid unremarkable. Lungs/Pleura: Moderate left pleural effusion. Minimal left base atelectasis. No confluent opacities on the right. Mild centrilobular and paraseptal emphysema. Musculoskeletal: Chest wall soft tissues are unremarkable. No acute bony abnormality. CT ABDOMEN PELVIS FINDINGS Hepatobiliary: No hepatic injury or perihepatic hematoma. Gallbladder is unremarkable. Pancreas: No focal hepatic abnormality.  Gallbladder unremarkable. Spleen: No splenic injury or perisplenic hematoma. Adrenals/Urinary Tract: No adrenal hemorrhage or renal injury identified. Bladder is unremarkable. No hydronephrosis. Stomach/Bowel: Sigmoid diverticulosis. No active diverticulitis. Stomach and small bowel decompressed, unremarkable. Vascular/Lymphatic: Aortic  atherosclerosis. No evidence of aneurysm or adenopathy. Reproductive: No visible focal abnormality. Other: No free fluid or free air. Musculoskeletal: No acute bony abnormality. IMPRESSION: No evidence of significant traumatic injury in the chest, abdomen or pelvis. Moderate left pleural effusion.  Left base atelectasis. Coronary artery disease, aortic atherosclerosis. Emphysema. No acute findings in the abdomen or pelvis. Sigmoid diverticulosis. These results were called by telephone at the time of interpretation on 07/01/2022 at 10:47 pm to provider Dr. Bedelia Person, who verbally acknowledged these results. Electronically Signed   By: Charlett Nose M.D.   On: 07/01/2022 22:49   CT Cervical Spine Wo Contrast  Result Date: 07/01/2022 CLINICAL DATA:  Neck trauma (Age >= 65y).  Fall EXAM: CT CERVICAL SPINE WITHOUT CONTRAST TECHNIQUE: Multidetector CT imaging of the cervical spine was performed without intravenous contrast. Multiplanar CT image reconstructions were also generated. RADIATION DOSE REDUCTION: This exam was performed according to the departmental dose-optimization program which includes automated exposure control, adjustment of the mA and/or kV according to patient size and/or use of iterative reconstruction technique. COMPARISON:  None Available. FINDINGS: Alignment: No subluxation Skull base and vertebrae: No acute fracture. No primary bone  lesion or focal pathologic process. Soft tissues and spinal canal: No prevertebral fluid or swelling. No visible canal hematoma. Disc levels: Diffuse degenerative disc disease with disc space narrowing and anterior spurring, most pronounced at C5-6. Mild bilateral degenerative facet disease, right greater than left. Upper chest: No acute findings. Other: None IMPRESSION: Mild degenerative changes.  No acute bony abnormality. Electronically Signed   By: Charlett Nose M.D.   On: 07/01/2022 22:44   CT HEAD WO CONTRAST  Result Date: 07/01/2022 CLINICAL DATA:  Head trauma,  moderate-severe.  Fall. EXAM: CT HEAD WITHOUT CONTRAST TECHNIQUE: Contiguous axial images were obtained from the base of the skull through the vertex without intravenous contrast. RADIATION DOSE REDUCTION: This exam was performed according to the departmental dose-optimization program which includes automated exposure control, adjustment of the mA and/or kV according to patient size and/or use of iterative reconstruction technique. COMPARISON:  06/14/2013 FINDINGS: Brain: There is atrophy and chronic small vessel disease changes. No acute intracranial abnormality. Specifically, no hemorrhage, hydrocephalus, mass lesion, acute infarction, or significant intracranial injury. Vascular: No hyperdense vessel or unexpected calcification. Skull: No acute calvarial abnormality. Sinuses/Orbits: No acute findings Other: None IMPRESSION: Atrophy, chronic microvascular disease. No acute intracranial abnormality. Electronically Signed   By: Charlett Nose M.D.   On: 07/01/2022 22:42   DG Chest Port 1 View  Result Date: 07/01/2022 CLINICAL DATA:  Larey Seat and hit head.  Hypotensive on arrival. EXAM: PORTABLE CHEST 1 VIEW COMPARISON:  PA Lat chest 05/05/2022, CTA chest, abdomen and pelvis 12/16/2021 FINDINGS: Portable chest single-view, 10:12 p.m.: There is a small layering left pleural effusion. This was seen previously and unchanged. The lungs are otherwise clear. The cardiac size is normal. There is aortic tortuosity and atherosclerosis with stable mediastinum. No vascular congestion is seen. Generalized osteopenia. Portable AP pelvis at 10:13 p.m.: No pelvic fracture or diastasis is seen. Degenerative change lower lumbar spine. No other focal bone abnormality. The vasa deferentia are heavily calcified. Scattered calcific plaque in both common femoral arteries. IMPRESSION: 1. Small left pleural effusion is similar to the PA and lateral chest 2 months ago. No new or acute radiographic chest findings. 2. Osteopenia. 3. Aortic  atherosclerosis. 4. No AP evidence of pelvic fracture or diastasis. Electronically Signed   By: Almira Bar M.D.   On: 07/01/2022 22:36   DG Pelvis Portable  Result Date: 07/01/2022 CLINICAL DATA:  Larey Seat and hit head.  Hypotensive on arrival. EXAM: PORTABLE CHEST 1 VIEW COMPARISON:  PA Lat chest 05/05/2022, CTA chest, abdomen and pelvis 12/16/2021 FINDINGS: Portable chest single-view, 10:12 p.m.: There is a small layering left pleural effusion. This was seen previously and unchanged. The lungs are otherwise clear. The cardiac size is normal. There is aortic tortuosity and atherosclerosis with stable mediastinum. No vascular congestion is seen. Generalized osteopenia. Portable AP pelvis at 10:13 p.m.: No pelvic fracture or diastasis is seen. Degenerative change lower lumbar spine. No other focal bone abnormality. The vasa deferentia are heavily calcified. Scattered calcific plaque in both common femoral arteries. IMPRESSION: 1. Small left pleural effusion is similar to the PA and lateral chest 2 months ago. No new or acute radiographic chest findings. 2. Osteopenia. 3. Aortic atherosclerosis. 4. No AP evidence of pelvic fracture or diastasis. Electronically Signed   By: Almira Bar M.D.   On: 07/01/2022 22:36    Procedures Procedures  {Document cardiac monitor, telemetry assessment procedure when appropriate:1}  Medications Ordered in ED Medications  iohexol (OMNIPAQUE) 350 MG/ML injection 75 mL (75 mLs Intravenous  Contrast Given 07/01/22 2238)  sodium chloride 0.9 % bolus 500 mL (500 mLs Intravenous New Bag/Given 07/01/22 2159)    ED Course/ Medical Decision Making/ A&P                          Medical Decision Making Amount and/or Complexity of Data Reviewed Labs: ordered. Radiology: ordered.  Risk Prescription drug management.    This patient presents to the ED for concern of ***, this involves an extensive number of treatment options, and is a complaint that carries with it a high  risk of complications and morbidity.  I considered the following differential and admission for this acute, potentially life threatening condition.   MDM:    ***  Clinical Course as of 07/01/22 2308  Sat Jul 01, 2022  2257 Creatinine(!): 2.10 +AKI [HN]  2257 WBC: 5.8 No leukocytosis [HN]  2258 Hemoglobin(!): 12.1 stable [HN]  2258 CT HEAD WO CONTRAST Atrophy, chronic microvascular disease.  No acute intracranial abnormality.   [HN]  2258 CT Cervical Spine Wo Contrast Mild degenerative changes. No acute bony abnormality. [HN]  2258 CT CHEST ABDOMEN PELVIS W CONTRAST No evidence of significant traumatic injury in the chest, abdomen or pelvis.  Moderate left pleural effusion.  Left base atelectasis.  Coronary artery disease, aortic atherosclerosis.  Emphysema.  No acute findings in the abdomen or pelvis.  Sigmoid diverticulosis.   [HN]  2258 DG Chest Port 1 View 1. Small left pleural effusion is similar to the PA and lateral chest 2 months ago. No new or acute radiographic chest findings. 2. Osteopenia. 3. Aortic atherosclerosis. 4. No AP evidence of pelvic fracture or diastasis.   [HN]  2259 DG Knee Complete 4 Views Left Negative. [HN]    Clinical Course User Index [HN] Loetta Rough, MD    Labs: I Ordered, and personally interpreted labs.  The pertinent results include:  ***  Imaging Studies ordered: I ordered imaging studies including *** I independently visualized and interpreted imaging. I agree with the radiologist interpretation  Additional history obtained from ***.  External records from outside source obtained and reviewed including ***  Cardiac Monitoring: .The patient was maintained on a cardiac monitor.  I personally viewed and interpreted the cardiac monitored which showed an underlying rhythm of: ***  Reevaluation: After the interventions noted above, I reevaluated the patient and found that they have  :{resolved/improved/worsened:23923::"improved"}  Social Determinants of Health: .***  Disposition:  ***  Co morbidities that complicate the patient evaluation . Past Medical History:  Diagnosis Date  . Abdominal aortic aneurysm (AAA)   . Atrial fibrillation and flutter   . CKD (chronic kidney disease) stage 3, GFR 30-59 ml/min   . CVA (cerebral infarction)   . Essential hypertension   . Hyperlipidemia   . Thoracic aortic aneurysm      Medicines Meds ordered this encounter  Medications  . iohexol (OMNIPAQUE) 350 MG/ML injection 75 mL  . DISCONTD: 0.9% NaCl bolus PEDS  . sodium chloride 0.9 % bolus 500 mL    I have reviewed the patients home medicines and have made adjustments as needed  Problem List / ED Course: Problem List Items Addressed This Visit   None        {Document critical care time when appropriate:1} {Document review of labs and clinical decision tools ie heart score, Chads2Vasc2 etc:1}  {Document your independent review of radiology images, and any outside records:1} {Document your discussion with family members, caretakers,  and with consultants:1} {Document social determinants of health affecting pt's care:1} {Document your decision making why or why not admission, treatments were needed:1}  This note was created using dictation software, which may contain spelling or grammatical errors.

## 2022-07-01 NOTE — ED Triage Notes (Addendum)
Pt presents via EMS from home. Fall from standing backwards. Struck head. LOC per wife. On eliquis for afib.  Hypotensive on arrival and upgraded to level 1 trauma from level 2.

## 2022-07-01 NOTE — Consult Note (Signed)
TRAUMA H&P  07/01/2022, 10:58 PM   Chief Complaint: Level 1 trauma activation for hypotension  Primary Survey:  ABC's intact on arrival Arrived with c-collar in place.  The patient is an 72 y.o. male.   HPI: 45M s/p presyncopal episode while walking to the mailbox and then after returning home, became lightheaded and fell backward hitting hit head and losing consciousness. Currently alert, activated as L2 trauma, upgraded to L1A for hypotension. Patient reports normally his blood pressure is high.  Past Medical History:  Diagnosis Date   Abdominal aortic aneurysm (AAA)    Atrial fibrillation and flutter    CKD (chronic kidney disease) stage 3, GFR 30-59 ml/min    CVA (cerebral infarction)    Essential hypertension    Hyperlipidemia    Thoracic aortic aneurysm     Past Surgical History:  Procedure Laterality Date   BIOPSY  08/17/2020   Procedure: BIOPSY;  Surgeon: Lanelle Bal, DO;  [facility];  Service: Endoscopy;;   BIOPSY  02/21/2021   Procedure: BIOPSY;  Surgeon: Lanelle Bal, DO;  [facility];  Service: Endoscopy;;   COLONOSCOPY  12/2009   Diverticulosis, mild internal hemorrhoids, multiple colon polyps removed (adenomatous). One irregular shaped polyp was tattooed. Next colonoscopy due October 2016.   COLONOSCOPY N/A 11/01/2015   Procedure: COLONOSCOPY;  Surgeon: West Bali, MD;  [facility];  Service: Endoscopy;  Laterality: N/A;  100   COLONOSCOPY WITH PROPOFOL N/A 08/17/2020   Procedure: COLONOSCOPY WITH PROPOFOL;  Surgeon: Lanelle Bal, DO;  [facility];  Service: Endoscopy;  Laterality: N/A;  2:15pm   ESOPHAGOGASTRODUODENOSCOPY N/A 10/08/2012   SLF: Moderate erosive gastritis/duodenitis. Biopsies benign.   ESOPHAGOGASTRODUODENOSCOPY N/A 11/01/2015   Procedure: ESOPHAGOGASTRODUODENOSCOPY (EGD);  Surgeon: West Bali, MD;  [facility];  Service: Endoscopy;  Laterality: N/A;    ESOPHAGOGASTRODUODENOSCOPY (EGD) WITH PROPOFOL N/A 02/21/2021   Procedure: ESOPHAGOGASTRODUODENOSCOPY (EGD) WITH PROPOFOL;  Surgeon: Lanelle Bal, DO;  [facility];  Service: Endoscopy;  Laterality: N/A;   POLYPECTOMY  08/17/2020   Procedure: POLYPECTOMY;  Surgeon: Lanelle Bal, DO;  [facility];  Service: Endoscopy;;    No pertinent family history.  Social History:  reports that he has been smoking cigarettes. He started smoking about 6 years ago. He has been smoking an average of .5 packs per day. He has never used smokeless tobacco. He reports current alcohol use of about 4.0 standard drinks of alcohol per week. He reports that he does not use drugs.     Allergies:  Allergies  Allergen Reactions   Ivp Dye [Iodinated Contrast Media] Nausea And Vomiting    Medications: reviewed  Results for orders placed or performed during the hospital encounter of 07/01/22 (from the past 48 hour(s))  CBC     Status: Abnormal   Collection Time: 07/01/22 10:00 PM  Result Value Ref Range   WBC 5.8 4.0 - 10.5 K/uL   RBC 3.45 (L) 4.22 - 5.81 MIL/uL   Hemoglobin 12.1 (L) 13.0 - 17.0 g/dL   HCT 16.1 (L) 09.6 - 04.5 %   MCV 104.6 (H) 80.0 - 100.0 fL   MCH 35.1 (H) 26.0 - 34.0 pg   MCHC 33.5 30.0 - 36.0 g/dL   RDW 40.9 81.1 - 91.4 %   Platelets 109 (L) 150 - 400 K/uL   nRBC 0.0 0.0 - 0.2 %    Comment: Performed at [facility]  [facility], [address] [place], [place] [address]  Protime-INR     Status: Abnormal   Collection Time: 07/01/22 10:00 PM  Result Value Ref Range   Prothrombin Time 15.3 (H) 11.4 - 15.2 seconds   INR 1.2 0.8 - 1.2    Comment: (NOTE) INR goal varies based on device and disease states. Performed at [facility], [address], [place], [place] [address]   Sample to Blood Bank     Status: None   Collection Time: 07/01/22 10:00 PM  Result Value Ref Range   Blood Bank Specimen SAMPLE AVAILABLE FOR TESTING    Sample Expiration       07/04/2022,2359 Performed at [facility], [address] [place], [place] [address]   I-Stat Chem 8, ED     Status: Abnormal   Collection Time: 07/01/22 10:11 PM  Result Value Ref Range   Sodium 135 135 - 145 mmol/L   Potassium 3.8 3.5 - 5.1 mmol/L   Chloride 102 98 - 111 mmol/L   BUN 31 (H) 8 - 23 mg/dL   Creatinine, Ser 9.14 (H) 0.61 - 1.24 mg/dL   Glucose, Bld 80 70 - 99 mg/dL    Comment: Glucose reference range applies only to samples taken after fasting for at least 8 hours.   Calcium, Ion 1.06 (L) 1.15 - 1.40 mmol/L   TCO2 21 (L) 22 - 32 mmol/L   Hemoglobin 13.3 13.0 - 17.0 g/dL   HCT 78.2 95.6 - 21.3 %    DG Knee Complete 4 Views Left  Result Date: 07/01/2022 CLINICAL DATA:  Fall EXAM: LEFT KNEE - COMPLETE 4+ VIEW COMPARISON:  None Available. FINDINGS: No evidence of fracture, dislocation, or joint effusion. No evidence of arthropathy or other focal bone abnormality. Soft tissues are unremarkable. IMPRESSION: Negative. Electronically Signed   By: Deatra Robinson M.D.   On: 07/01/2022 22:55   CT CHEST ABDOMEN PELVIS W CONTRAST  Result Date: 07/01/2022 CLINICAL DATA:  Polytrauma, blunt.  Fall. EXAM: CT CHEST, ABDOMEN, AND PELVIS WITH CONTRAST TECHNIQUE: Multidetector CT imaging of the chest, abdomen and pelvis was performed following the standard protocol during bolus administration of intravenous contrast. RADIATION DOSE REDUCTION: This exam was performed according to the departmental dose-optimization program which includes automated exposure control, adjustment of the mA and/or kV according to patient size and/or use of iterative reconstruction technique. CONTRAST:  75mL OMNIPAQUE IOHEXOL 350 MG/ML SOLN COMPARISON:  12/16/2021 FINDINGS: CT CHEST FINDINGS Cardiovascular: Heart is normal size. Aorta normal caliber. No dissection. Scattered coronary artery and aortic calcifications. Mediastinum/Nodes: No mediastinal, hilar, or axillary adenopathy. Trachea and esophagus are  unremarkable. Thyroid unremarkable. Lungs/Pleura: Moderate left pleural effusion. Minimal left base atelectasis. No confluent opacities on the right. Mild centrilobular and paraseptal emphysema. Musculoskeletal: Chest wall soft tissues are unremarkable. No acute bony abnormality. CT ABDOMEN PELVIS FINDINGS Hepatobiliary: No hepatic injury or perihepatic hematoma. Gallbladder is unremarkable. Pancreas: No focal hepatic abnormality.  Gallbladder unremarkable. Spleen: No splenic injury or perisplenic hematoma. Adrenals/Urinary Tract: No adrenal hemorrhage or renal injury identified. Bladder is unremarkable. No hydronephrosis. Stomach/Bowel: Sigmoid diverticulosis. No active diverticulitis. Stomach and small bowel decompressed, unremarkable. Vascular/Lymphatic: Aortic atherosclerosis. No evidence of aneurysm or adenopathy. Reproductive: No visible focal abnormality. Other: No free fluid or free air. Musculoskeletal: No acute bony abnormality. IMPRESSION: No evidence of significant traumatic injury in the chest, abdomen or pelvis. Moderate left pleural effusion.  Left base atelectasis. Coronary artery disease, aortic atherosclerosis. Emphysema. No acute findings in the abdomen  or pelvis. Sigmoid diverticulosis. These results were called by telephone at the time of interpretation on 07/01/2022 at 10:47 pm to provider Dr. Bedelia Person, who verbally acknowledged these results. Electronically Signed   By: Charlett Nose M.D.   On: 07/01/2022 22:49   CT Cervical Spine Wo Contrast  Result Date: 07/01/2022 CLINICAL DATA:  Neck trauma (Age >= 65y).  Fall EXAM: CT CERVICAL SPINE WITHOUT CONTRAST TECHNIQUE: Multidetector CT imaging of the cervical spine was performed without intravenous contrast. Multiplanar CT image reconstructions were also generated. RADIATION DOSE REDUCTION: This exam was performed according to the departmental dose-optimization program which includes automated exposure control, adjustment of the mA and/or kV  according to patient size and/or use of iterative reconstruction technique. COMPARISON:  None Available. FINDINGS: Alignment: No subluxation Skull base and vertebrae: No acute fracture. No primary bone lesion or focal pathologic process. Soft tissues and spinal canal: No prevertebral fluid or swelling. No visible canal hematoma. Disc levels: Diffuse degenerative disc disease with disc space narrowing and anterior spurring, most pronounced at C5-6. Mild bilateral degenerative facet disease, right greater than left. Upper chest: No acute findings. Other: None IMPRESSION: Mild degenerative changes.  No acute bony abnormality. Electronically Signed   By: Charlett Nose M.D.   On: 07/01/2022 22:44   CT HEAD WO CONTRAST  Result Date: 07/01/2022 CLINICAL DATA:  Head trauma, moderate-severe.  Fall. EXAM: CT HEAD WITHOUT CONTRAST TECHNIQUE: Contiguous axial images were obtained from the base of the skull through the vertex without intravenous contrast. RADIATION DOSE REDUCTION: This exam was performed according to the departmental dose-optimization program which includes automated exposure control, adjustment of the mA and/or kV according to patient size and/or use of iterative reconstruction technique. COMPARISON:  06/14/2013 FINDINGS: Brain: There is atrophy and chronic small vessel disease changes. No acute intracranial abnormality. Specifically, no hemorrhage, hydrocephalus, mass lesion, acute infarction, or significant intracranial injury. Vascular: No hyperdense vessel or unexpected calcification. Skull: No acute calvarial abnormality. Sinuses/Orbits: No acute findings Other: None IMPRESSION: Atrophy, chronic microvascular disease. No acute intracranial abnormality. Electronically Signed   By: Charlett Nose M.D.   On: 07/01/2022 22:42   DG Chest Port 1 View  Result Date: 07/01/2022 CLINICAL DATA:  Larey Seat and hit head.  Hypotensive on arrival. EXAM: PORTABLE CHEST 1 VIEW COMPARISON:  PA Lat chest 05/05/2022, CTA  chest, abdomen and pelvis 12/16/2021 FINDINGS: Portable chest single-view, 10:12 p.m.: There is a small layering left pleural effusion. This was seen previously and unchanged. The lungs are otherwise clear. The cardiac size is normal. There is aortic tortuosity and atherosclerosis with stable mediastinum. No vascular congestion is seen. Generalized osteopenia. Portable AP pelvis at 10:13 p.m.: No pelvic fracture or diastasis is seen. Degenerative change lower lumbar spine. No other focal bone abnormality. The vasa deferentia are heavily calcified. Scattered calcific plaque in both common femoral arteries. IMPRESSION: 1. Small left pleural effusion is similar to the PA and lateral chest 2 months ago. No new or acute radiographic chest findings. 2. Osteopenia. 3. Aortic atherosclerosis. 4. No AP evidence of pelvic fracture or diastasis. Electronically Signed   By: Almira Bar M.D.   On: 07/01/2022 22:36   DG Pelvis Portable  Result Date: 07/01/2022 CLINICAL DATA:  Larey Seat and hit head.  Hypotensive on arrival. EXAM: PORTABLE CHEST 1 VIEW COMPARISON:  PA Lat chest 05/05/2022, CTA chest, abdomen and pelvis 12/16/2021 FINDINGS: Portable chest single-view, 10:12 p.m.: There is a small layering left pleural effusion. This was seen previously and unchanged. The lungs are otherwise  clear. The cardiac size is normal. There is aortic tortuosity and atherosclerosis with stable mediastinum. No vascular congestion is seen. Generalized osteopenia. Portable AP pelvis at 10:13 p.m.: No pelvic fracture or diastasis is seen. Degenerative change lower lumbar spine. No other focal bone abnormality. The vasa deferentia are heavily calcified. Scattered calcific plaque in both common femoral arteries. IMPRESSION: 1. Small left pleural effusion is similar to the PA and lateral chest 2 months ago. No new or acute radiographic chest findings. 2. Osteopenia. 3. Aortic atherosclerosis. 4. No AP evidence of pelvic fracture or diastasis.  Electronically Signed   By: Almira Bar M.D.   On: 07/01/2022 22:36    ROS 10 point review of systems is negative except as listed above in HPI.  Blood pressure 104/69, pulse 79, temperature (!) 97.5 F (36.4 C), temperature source Oral, resp. rate 19, height  (1.778 m), weight 93.4 kg, SpO2 100 %.  Secondary Survey:  GCS: E(4)//V(5)//M(6) Constitutional: well-developed, well-nourished Skull: normocephalic, atraumatic Eyes: pupils equal, round, reactive to light, 2mm b/l, moist conjunctiva Face/ENT: midface stable without deformity, normal  dentition, external inspection of ears and nose normal, hearing intact  Oropharynx: normal oropharyngeal mucosa, no blood,   Neck: no thyromegaly, trachea midline, c-collar  removed by EDP , no midline cervical tenderness to palpation, no C-spine stepoffs Chest: breath sounds equal bilaterally, normal  respiratory effort, no midline or lateral chest wall tenderness to palpation/deformity Abdomen: soft, NT, no bruising, no hepatosplenomegaly FAST: trace pericardial effusion, negative abdominal windows, L sided pleural effusion noted Pelvis: stable GU: no blood at urethral meatus of penis, no scrotal masses or abnormality Back: no wounds, no T/L spine TTP, no T/L spine stepoffs Rectal: deferred Extremities: 2+  radial and pedal pulses bilaterally, intact motor and sensation of bilateral UE and LE, no peripheral edema, bruising of L elbow and L knee MSK: unable to assess gait/station, no clubbing/cyanosis of fingers/toes, normal ROM of all four extremities Skin: warm, dry, no rashes  CXR in TB: L sided effusion Pelvis XR in TB: unremarkable   Assessment/Plan: Problem List 60M s/p syncopal fall  Plan Fall - trauma scans negative Bruising L knee and elbow - XR pending Hypotension - improved with fluids Syncope - recommend IMS workup FEN - regular diet DVT - SCDs, LMWH for DVT ppx or okay to resume DOAC Dispo - Cleared from trauma  perspective pending XR, awaiting disposition determination by IMS service   Diamantina Monks, MD General and Trauma Surgery [facility] Surgery

## 2022-07-01 NOTE — Progress Notes (Signed)
Trauma Response Nurse Documentation   Anthony Little is a 72 y.o. male arriving to [facility] ED via [facility]  On Eliquis (apixaban) daily. Trauma was activated as a Level 2 by Anthony Little based on the following trauma criteria Elderly patients > 65 with head trauma on anti-coagulation (excluding ASA). Trauma team at the bedside on patient arrival.   Pt arrived to Trauma B at 2153. Pt's manual BP 82/62, upgraded to a level 1 at 2158. Verbal order for warmed NS bolus by Anthony Fenton Little, started at 2159. Anthony Little arrived at 2207.  Patient cleared for CT by Dr. Bedelia Little. Pt transported to CT with trauma response nurse present to monitor. RN remained with the patient throughout their absence from the department for clinical observation.   GCS 15.  History   Past Medical History:  Diagnosis Date   Abdominal aortic aneurysm (AAA)    Atrial fibrillation and flutter    CKD (chronic kidney disease) stage 3, GFR 30-59 ml/min    CVA (cerebral infarction)    Essential hypertension    Hyperlipidemia    Thoracic aortic aneurysm      Past Surgical History:  Procedure Laterality Date   BIOPSY  08/17/2020   Procedure: BIOPSY;  Surgeon: Anthony Bal, DO;  [facility];  Service: Endoscopy;;   BIOPSY  02/21/2021   Procedure: BIOPSY;  Surgeon: Anthony Bal, DO;  [facility];  Service: Endoscopy;;   COLONOSCOPY  12/2009   Diverticulosis, mild internal hemorrhoids, multiple colon polyps removed (adenomatous). One irregular shaped polyp was tattooed. Next colonoscopy due October 2016.   COLONOSCOPY N/A 11/01/2015   Procedure: COLONOSCOPY;  Surgeon: Anthony Bali, Little;  [facility];  Service: Endoscopy;  Laterality: N/A;  100   COLONOSCOPY WITH PROPOFOL N/A 08/17/2020   Procedure: COLONOSCOPY WITH PROPOFOL;  Surgeon: Anthony Bal, DO;  [facility];  Service: Endoscopy;  Laterality: N/A;  2:15pm   ESOPHAGOGASTRODUODENOSCOPY N/A  10/08/2012   SLF: Moderate erosive gastritis/duodenitis. Biopsies benign.   ESOPHAGOGASTRODUODENOSCOPY N/A 11/01/2015   Procedure: ESOPHAGOGASTRODUODENOSCOPY (EGD);  Surgeon: Anthony Bali, Little;  [facility];  Service: Endoscopy;  Laterality: N/A;   ESOPHAGOGASTRODUODENOSCOPY (EGD) WITH PROPOFOL N/A 02/21/2021   Procedure: ESOPHAGOGASTRODUODENOSCOPY (EGD) WITH PROPOFOL;  Surgeon: Anthony Bal, DO;  [facility];  Service: Endoscopy;  Laterality: N/A;   POLYPECTOMY  08/17/2020   Procedure: POLYPECTOMY;  Surgeon: Anthony Bal, DO;  [facility];  Service: Endoscopy;;       Initial Focused Assessment (If applicable, or please see trauma documentation): Airway - intact Breathing - bilateral lower lobe crackles, upper lobes clear. No increased WOB, chest expansion symmetrical. Circulation - no hemorrhaging noted, 3+ bilateral radial, pedal, and central pulses. Cap refill >3 seconds on bilateral lower extremities. Afib rhythm noted on EKG, controlled rate 70-80s bpm. Disability - GCS 15  CT's Completed:   CT Head, CT C-Spine, CT Chest w/ contrast, and CT abdomen/pelvis w/ contrast   Interventions:  Xray chest, pelvis, L knee, L elbow warmed NS bolus  Plan for disposition:  {Trauma Dispo:26867}   Consults completed:  {Trauma Consults:26862} at ***.  Event Summary:  Pt arrived as level 2 trauma for fall on eliquis. Pt fell backwards in home, hit posterior head on floor. Wife witnessed event, noted +LOC. GCS 15 upon arrival. Upgraded to level 1 at 2158 due to hypotension.  Fast exam revealed trace pericardial  effusion and L pleural effusion  Dg chest and pelvis completed, pt transported to CT at 2213 with TRN and Anthony Little. Returned  to trauma B at 2231.    Bedside handoff with ED RN Anthony Little.    Anthony Little E Anthony Little  Trauma Response RN  Please call TRN at [phone] for further assistance.

## 2022-07-02 LAB — LACTIC ACID, PLASMA: Lactic Acid, Venous: 2.3 mmol/L (ref 0.5–1.9)

## 2022-07-02 LAB — TROPONIN I (HIGH SENSITIVITY): Troponin I (High Sensitivity): 12 ng/L (ref <18)

## 2022-07-02 NOTE — Progress Notes (Signed)
Orthopedic Tech Progress Note Patient Details:  Anthony Little Jul 08, 1950 [id number]  Patient ID: Anthony Little, male   DOB: [birth year], 72 y.o.   MRN: [id number] I attended trauma page. Trinna Post 07/02/2022, 6:40 AM

## 2022-07-02 NOTE — Discharge Instructions (Signed)
You were evaluated in the Emergency Department and after careful evaluation, we did not find any emergent condition requiring admission or further testing in the hospital.  Your exam/testing today was overall reassuring.  Keep your follow-up with your doctor in a few days to discuss your ED visit.  Drink some more fluids over the next few days.  Please return to the Emergency Department if you experience any worsening of your condition.  Thank you for allowing [place] to be a part of your care.

## 2022-07-02 NOTE — ED Provider Notes (Addendum)
  Provider Note MRN:  [id number]  Arrival date & time: 07/02/22    ED Course and Medical Decision Making  Assumed care from Dr. Gayleen Orem at shift change.  Level 2 trauma fall on thinners.  Mild alcohol intoxication.  Trauma imaging reassuring.  Was hypotensive on arrival, which improved with fluids.  Family explains he has not been drinking fluids at home, generally not wanting to take care of himself.  Patient acknowledges this and says he will do better.  Lactate downtrending with fluids, troponin negative, never had any chest pain, appropriate for discharge.  Procedures  Final Clinical Impressions(s) / ED Diagnoses     ICD-10-CM   1. Fall, initial encounter  W19.XXXA     2. Dehydration  E86.0       ED Discharge Orders     None         Discharge Instructions      You were evaluated in the Emergency Department and after careful evaluation, we did not find any emergent condition requiring admission or further testing in the hospital.  Your exam/testing today was overall reassuring.  Keep your follow-up with your doctor in a few days to discuss your ED visit.  Drink some more fluids over the next few days.  Please return to the Emergency Department if you experience any worsening of your condition.  Thank you for allowing [place] to be a part of your care.     Elmer Sow. Pilar Plate, MD [facility] Emergency Medicine [facility] [email or web address]    Sabas Sous, MD 07/02/22 1027    Sabas Sous, MD 07/02/22 [phone]

## 2022-07-02 NOTE — Progress Notes (Signed)
   07/01/22 2145  Spiritual Encounters  Type of Visit Initial  Care provided to: Patient  Referral source Trauma page  Reason for visit Trauma  OnCall Visit Yes   Chap responded to Fall on thinners page.  PT not available as medical team provided care. No support person present.  Mirna Mires remains available by page.

## 2022-07-06 ENCOUNTER — Telehealth: Payer: Self-pay

## 2022-07-06 NOTE — Telephone Encounter (Signed)
     Patient  visit on 07/02/2022  at [facility] was for fall on thinners.  Have you been able to follow up with your primary care physician? Yes  The patient was or was not able to obtain any needed medicine or equipment. No medication prescribed.  Are there diet recommendations that you are having difficulty following? No  Patient expresses understanding of discharge instructions and education provided has no other needs at this time. Yes   Anthony Little Health  [facility] Population Health Community Resource Care Guide   [email or web address]  ?? [phone]   Website: [email or web address]  [email or web address]

## 2022-07-07 ENCOUNTER — Ambulatory Visit: Payer: Medicare Other | Admitting: Family Medicine

## 2022-07-10 DIAGNOSIS — G4730 Sleep apnea, unspecified: Secondary | ICD-10-CM | POA: Diagnosis not present

## 2022-07-11 ENCOUNTER — Ambulatory Visit: Payer: Medicare Other | Admitting: Pulmonary Disease

## 2022-07-11 ENCOUNTER — Encounter: Payer: Self-pay | Admitting: Pulmonary Disease

## 2022-07-11 VITALS — BP 156/98 | HR 107 | Ht 70.0 in | Wt 209.0 lb

## 2022-07-11 DIAGNOSIS — J4489 Other specified chronic obstructive pulmonary disease: Secondary | ICD-10-CM

## 2022-07-11 DIAGNOSIS — J439 Emphysema, unspecified: Secondary | ICD-10-CM | POA: Diagnosis not present

## 2022-07-11 MED ORDER — ALBUTEROL SULFATE (2.5 MG/3ML) 0.083% IN NEBU: 2.5000 mg | INHALATION_SOLUTION | Freq: Four times a day (QID) | RESPIRATORY_TRACT | 12 refills | Status: DC | PRN

## 2022-07-11 MED ORDER — TRELEGY ELLIPTA 100-62.5-25 MCG/ACT IN AEPB: 1.0000 | INHALATION_SPRAY | Freq: Every day | RESPIRATORY_TRACT | 0 refills | Status: DC

## 2022-07-11 MED ORDER — TRELEGY ELLIPTA 100-62.5-25 MCG/ACT IN AEPB: 1.0000 | INHALATION_SPRAY | Freq: Every day | RESPIRATORY_TRACT | 5 refills | Status: DC

## 2022-07-11 NOTE — Patient Instructions (Signed)
Trelegy one puff daily and rinse your mouth after each use.  Will arrange for a home nebulizer machine.  Albuterol every 6 hours as needed for cough, wheeze, or chest congestion.  Will call with home sleep study results.  Follow up in 4 months.

## 2022-07-11 NOTE — Progress Notes (Signed)
Pulmonary, Critical Care, and Sleep Medicine  Chief Complaint  Patient presents with   Follow-up    Pt f/u states that his breathing is baseline. Denies any chest pain/discomfort, sleep study has ben completed but he is unsure if Dr.Schae Cando has the results yet    Past Surgical History:  He  has a past surgical history that includes Colonoscopy (12/2009); Esophagogastroduodenoscopy (N/A, 10/08/2012); Colonoscopy (N/A, 11/01/2015); Esophagogastroduodenoscopy (N/A, 11/01/2015); Colonoscopy with propofol (N/A, 08/17/2020); polypectomy (08/17/2020); biopsy (08/17/2020); Esophagogastroduodenoscopy (egd) with propofol (N/A, 02/21/2021); and biopsy (02/21/2021).  Past Medical History:  AAA, A fib, CKD 3b, CVA, HTN, HLD  Constitutional:  BP (!) 156/98   Pulse (!) 107   Ht 5\' 10"  (1.778 m)   Wt 209 lb (94.8 kg)   SpO2 98%   BMI 29.99 kg/m   Brief Summary:  Anthony Little is a 72 y.o. male with COPD from emphysema, transudate Lt pleural effusion, and snoring.      Subjective:   He tripped on a dog bone while trying to run to the bathroom and hit his head.  Went to ER.  Trauma CT showed Lt effusion and emphysema.  He had to start using dulera again.  Albuterol was on back order.  Has cough with clear sputum.  No wheeze, fever, hemoptysis, or chest pain.  Doesn't feel like his breathing limits his activity.    He did home sleep study and mailed test equipment back a couple days ago.  Sinus congestion better since he switched to azelastine.  Physical Exam:   Appearance - well kempt   ENMT - no sinus tenderness, no oral exudate, no LAN, Mallampati 2 airway, no stridor  Respiratory - decreased BS at Lt base, no wheeze/rales  CV - irregular  Ext - no clubbing, no edema  Skin - no rashes  Psych - normal mood and affect    Pulmonary testing:  PFT 08/30/21 >> FEV1 2.44 (74%), FEV1% 67, TLC 6.62 (94%), RV 3.12 (127%), DLCO 69%, +BD Lt thoracentesis 02/23/22 >> 1.4 liters,  glucose 103, LDH 84, protein 4.3, WBC 332 (23M, 24N, 20L), cytology and culture negative  Chest Imaging:  CT angio chest 12/16/21 >> moderate centrilobular and paraseptal emphysema, stable and benign 4 mm LLL nodule CT chest 07/01/22 >> mod Lt effusion, mild emphysema  Sleep Tests:    Cardiac Tests:  Echo 03/03/22 >> EF 55 to 60%, mild LVH  Social History:  He  reports that he has been smoking cigarettes. He started smoking about 6 years ago. He has been smoking an average of .5 packs per day. He has never used smokeless tobacco. He reports current alcohol use of about 4.0 standard drinks of alcohol per week. He reports that he does not use drugs.  Family History:  His family history includes Diabetes in his maternal grandfather and mother; Parkinson's disease in his father.     Assessment/Plan:   COPD with emphysema. - switch from dulera to trelegy 100 one puff daily; provided sample and demonstrated inhaler technique - will arrange for a home nebulizer machine - prn albuterol  Tobacco abuse. - he is making progress with smoking cessation - discussed importance of smoking cessation  Snoring. - associated with sleep disruption, apnea, and daytime sleepiness - he has history of hypertension and CVA - I am concerned he could have obstructive sleep apnea - will call him with results of home sleep study  Transudate left pleural effusion. - continue lasix  Allergic rhinitis. - prn  azelastine  Persistent atrial fibrillation, Thoracic aortic aneurysm. - followed by Dr. Nona Dell with cardiology  Time Spent Involved in Patient Care on Day of Examination:  36 minutes  Follow up:   Patient Instructions  Trelegy one puff daily and rinse your mouth after each use.  Will arrange for a home nebulizer machine.  Albuterol every 6 hours as needed for cough, wheeze, or chest congestion.  Will call with home sleep study results.  Follow up in 4 months.  Medication  List:   Allergies as of 07/11/2022       Reactions   Ivp Dye [iodinated Contrast Media] Nausea And Vomiting        Medication List        Accurate as of July 11, 2022  2:52 PM. If you have any questions, ask your nurse or doctor.          STOP taking these medications    Azelastine HCl 0.15 % Soln Commonly known as: Astepro Stopped by: Coralyn Helling, MD       TAKE these medications    albuterol 108 (90 Base) MCG/ACT inhaler Commonly known as: VENTOLIN HFA Inhale 2 puffs into the lungs every 6 (six) hours as needed for wheezing or shortness of breath. What changed: Another medication with the same name was added. Make sure you understand how and when to take each. Changed by: Coralyn Helling, MD   albuterol (2.5 MG/3ML) 0.083% nebulizer solution Commonly known as: PROVENTIL Take 3 mLs (2.5 mg total) by nebulization every 6 (six) hours as needed for wheezing or shortness of breath. What changed: You were already taking a medication with the same name, and this prescription was added. Make sure you understand how and when to take each. Changed by: Coralyn Helling, MD   buPROPion 150 MG 24 hr tablet Commonly known as: WELLBUTRIN XL TAKE 1 TABLET BY MOUTH EVERY DAY   Eliquis 5 MG Tabs tablet Generic drug: apixaban TAKE 1 TABLET BY MOUTH TWICE A DAY   fluticasone 50 MCG/ACT nasal spray Commonly known as: FLONASE Place 1 spray into both nostrils at bedtime.   furosemide 20 MG tablet Commonly known as: LASIX TAKE 1 TABLET BY MOUTH EVERY DAY   glucosamine-chondroitin 500-400 MG tablet Take 2 tablets by mouth every morning.   ipratropium 0.06 % nasal spray Commonly known as: ATROVENT Place 2 sprays into both nostrils 4 (four) times daily.   metoprolol succinate 25 MG 24 hr tablet Commonly known as: TOPROL-XL TAKE 1 TABLET (25 MG TOTAL) BY MOUTH DAILY.   multivitamin with minerals Tabs tablet Take 1 tablet by mouth daily.   rosuvastatin 10 MG tablet Commonly known  as: Crestor Take 1 tablet (10 mg total) by mouth daily.   sertraline 50 MG tablet Commonly known as: ZOLOFT Take 1 tablet (50 mg total) by mouth daily.   tamsulosin 0.4 MG Caps capsule Commonly known as: FLOMAX 1 each evening   Trelegy Ellipta 100-62.5-25 MCG/ACT Aepb Generic drug: Fluticasone-Umeclidin-Vilant Inhale 1 puff into the lungs daily in the afternoon. Started by: Coralyn Helling, MD   Trelegy Ellipta 100-62.5-25 MCG/ACT Aepb Generic drug: Fluticasone-Umeclidin-Vilant Inhale 1 puff into the lungs daily. Started by: Coralyn Helling, MD        Signature:  Coralyn Helling, MD [facility] Pulmonary/Critical Care Pager - [phone] 07/11/2022, 2:52 PM

## 2022-07-12 ENCOUNTER — Other Ambulatory Visit: Payer: Self-pay | Admitting: Family Medicine

## 2022-07-12 DIAGNOSIS — J441 Chronic obstructive pulmonary disease with (acute) exacerbation: Secondary | ICD-10-CM | POA: Diagnosis not present

## 2022-07-13 ENCOUNTER — Telehealth: Payer: Self-pay | Admitting: Family Medicine

## 2022-07-13 DIAGNOSIS — R3589 Other polyuria: Secondary | ICD-10-CM

## 2022-07-13 DIAGNOSIS — R739 Hyperglycemia, unspecified: Secondary | ICD-10-CM

## 2022-07-13 DIAGNOSIS — E639 Nutritional deficiency, unspecified: Secondary | ICD-10-CM

## 2022-07-13 DIAGNOSIS — F1010 Alcohol abuse, uncomplicated: Secondary | ICD-10-CM

## 2022-07-13 NOTE — Telephone Encounter (Signed)
Nurses  I had a good conversation with his wife regarding his issues He has an appointment on Tuesday He is aware to do blood work he will do this Monday afternoon at [facility] Please do the following  CBC, CMP with estimated GFR, thiamine level, B12, A1c  Diagnosis Alcoholism, nutritional deficiencies, anemia macrocytic, CKD 3, polyuria, hyperglycemia

## 2022-07-13 NOTE — Telephone Encounter (Signed)
Blood  work ordered in EPIC. Patient aware. 

## 2022-07-17 DIAGNOSIS — R3589 Other polyuria: Secondary | ICD-10-CM | POA: Diagnosis not present

## 2022-07-17 DIAGNOSIS — R739 Hyperglycemia, unspecified: Secondary | ICD-10-CM | POA: Diagnosis not present

## 2022-07-17 DIAGNOSIS — E639 Nutritional deficiency, unspecified: Secondary | ICD-10-CM | POA: Diagnosis not present

## 2022-07-18 ENCOUNTER — Encounter (HOSPITAL_COMMUNITY): Payer: Self-pay | Admitting: Emergency Medicine

## 2022-07-18 ENCOUNTER — Emergency Department (HOSPITAL_COMMUNITY)
Admission: EM | Admit: 2022-07-18 | Discharge: 2022-07-18 | Disposition: A | Discharge status: Home / Self Care | Payer: Medicare Other | Attending: Emergency Medicine | Admitting: Emergency Medicine

## 2022-07-18 ENCOUNTER — Ambulatory Visit (INDEPENDENT_AMBULATORY_CARE_PROVIDER_SITE_OTHER): Payer: Medicare Other | Admitting: Family Medicine

## 2022-07-18 ENCOUNTER — Other Ambulatory Visit: Payer: Self-pay

## 2022-07-18 ENCOUNTER — Emergency Department (HOSPITAL_COMMUNITY): Payer: Medicare Other

## 2022-07-18 VITALS — BP 121/88 | Wt 208.8 lb

## 2022-07-18 DIAGNOSIS — I4891 Unspecified atrial fibrillation: Secondary | ICD-10-CM | POA: Insufficient documentation

## 2022-07-18 DIAGNOSIS — N183 Chronic kidney disease, stage 3 unspecified: Secondary | ICD-10-CM | POA: Insufficient documentation

## 2022-07-18 DIAGNOSIS — S060X1A Concussion with loss of consciousness of 30 minutes or less, initial encounter: Secondary | ICD-10-CM

## 2022-07-18 DIAGNOSIS — D649 Anemia, unspecified: Secondary | ICD-10-CM | POA: Diagnosis not present

## 2022-07-18 DIAGNOSIS — S0990XA Unspecified injury of head, initial encounter: Secondary | ICD-10-CM

## 2022-07-18 DIAGNOSIS — Y92009 Unspecified place in unspecified non-institutional (private) residence as the place of occurrence of the external cause: Secondary | ICD-10-CM | POA: Insufficient documentation

## 2022-07-18 DIAGNOSIS — E875 Hyperkalemia: Secondary | ICD-10-CM | POA: Diagnosis not present

## 2022-07-18 DIAGNOSIS — W1839XA Other fall on same level, initial encounter: Secondary | ICD-10-CM | POA: Diagnosis not present

## 2022-07-18 DIAGNOSIS — I4821 Permanent atrial fibrillation: Secondary | ICD-10-CM

## 2022-07-18 DIAGNOSIS — Z79899 Other long term (current) drug therapy: Secondary | ICD-10-CM | POA: Insufficient documentation

## 2022-07-18 DIAGNOSIS — I129 Hypertensive chronic kidney disease with stage 1 through stage 4 chronic kidney disease, or unspecified chronic kidney disease: Secondary | ICD-10-CM | POA: Diagnosis not present

## 2022-07-18 DIAGNOSIS — Z7901 Long term (current) use of anticoagulants: Secondary | ICD-10-CM | POA: Diagnosis not present

## 2022-07-18 DIAGNOSIS — S0001XA Abrasion of scalp, initial encounter: Secondary | ICD-10-CM | POA: Diagnosis not present

## 2022-07-18 DIAGNOSIS — S0003XA Contusion of scalp, initial encounter: Secondary | ICD-10-CM | POA: Diagnosis not present

## 2022-07-18 DIAGNOSIS — R55 Syncope and collapse: Secondary | ICD-10-CM

## 2022-07-18 DIAGNOSIS — I2510 Atherosclerotic heart disease of native coronary artery without angina pectoris: Secondary | ICD-10-CM | POA: Insufficient documentation

## 2022-07-18 DIAGNOSIS — N1832 Chronic kidney disease, stage 3b: Secondary | ICD-10-CM

## 2022-07-18 DIAGNOSIS — F1010 Alcohol abuse, uncomplicated: Secondary | ICD-10-CM | POA: Diagnosis not present

## 2022-07-18 DIAGNOSIS — S199XXA Unspecified injury of neck, initial encounter: Secondary | ICD-10-CM | POA: Diagnosis not present

## 2022-07-18 DIAGNOSIS — W19XXXA Unspecified fall, initial encounter: Secondary | ICD-10-CM

## 2022-07-18 LAB — BASIC METABOLIC PANEL
Anion gap: 12 (ref 5–15)
BUN: 20 mg/dL (ref 8–23)
CO2: 21 mmol/L — ABNORMAL LOW (ref 22–32)
Calcium: 8.8 mg/dL — ABNORMAL LOW (ref 8.9–10.3)
Chloride: 97 mmol/L — ABNORMAL LOW (ref 98–111)
Creatinine, Ser: 1.61 mg/dL — ABNORMAL HIGH (ref 0.61–1.24)
GFR, Estimated: 46 mL/min — ABNORMAL LOW (ref >60)
Glucose, Bld: 90 mg/dL (ref 70–99)
Potassium: 4.4 mmol/L (ref 3.5–5.1)
Sodium: 130 mmol/L — ABNORMAL LOW (ref 135–145)

## 2022-07-18 LAB — CBC
HCT: 35.4 % — ABNORMAL LOW (ref 39.0–52.0)
Hemoglobin: 11.5 g/dL — ABNORMAL LOW (ref 13.0–17.0)
MCH: 35.8 pg — ABNORMAL HIGH (ref 26.0–34.0)
MCHC: 32.5 g/dL (ref 30.0–36.0)
MCV: 110.3 fL — ABNORMAL HIGH (ref 80.0–100.0)
Platelets: 144 10*3/uL — ABNORMAL LOW (ref 150–400)
RBC: 3.21 MIL/uL — ABNORMAL LOW (ref 4.22–5.81)
RDW: 15.9 % — ABNORMAL HIGH (ref 11.5–15.5)
WBC: 4.7 10*3/uL (ref 4.0–10.5)
nRBC: 0 % (ref 0.0–0.2)

## 2022-07-18 LAB — CBC WITH DIFFERENTIAL/PLATELET
Basophils Absolute: 0.1 10*3/uL (ref 0.0–0.2)
EOS (ABSOLUTE): 0.1 10*3/uL (ref 0.0–0.4)
Hematocrit: 33.9 % — ABNORMAL LOW (ref 37.5–51.0)
MCHC: 34.2 g/dL (ref 31.5–35.7)
Monocytes Absolute: 0.5 10*3/uL (ref 0.1–0.9)
RDW: 14.2 % (ref 11.6–15.4)

## 2022-07-18 LAB — CMP14+EGFR
AST: 30 IU/L (ref 0–40)
BUN: 21 mg/dL (ref 8–27)
CO2: 20 mmol/L (ref 20–29)
Calcium: 9.6 mg/dL (ref 8.6–10.2)
Chloride: 98 mmol/L (ref 96–106)
Globulin, Total: 3 g/dL (ref 1.5–4.5)
Glucose: 102 mg/dL — ABNORMAL HIGH (ref 70–99)
Potassium: 6.3 mmol/L — ABNORMAL HIGH (ref 3.5–5.2)

## 2022-07-18 LAB — TROPONIN I (HIGH SENSITIVITY): Troponin I (High Sensitivity): 7 ng/L (ref <18)

## 2022-07-18 LAB — ETHANOL: Alcohol, Ethyl (B): <10 mg/dL (ref <10)

## 2022-07-18 LAB — VITAMIN B1

## 2022-07-18 MED ORDER — SODIUM CHLORIDE 0.9 % IV BOLUS
500.0000 mL | Freq: Once | INTRAVENOUS | Status: AC
  Administered 2022-07-18: 500 mL via INTRAVENOUS

## 2022-07-18 MED ORDER — SODIUM CHLORIDE 0.9 % IV BOLUS: 1000.0000 mL | Freq: Once | INTRAVENOUS | Status: DC

## 2022-07-18 NOTE — ED Provider Notes (Signed)
[place] EMERGENCY DEPARTMENT AT [facility] Provider Note   CSN: [id number] Arrival date & time: 07/18/22  1335     History  Chief Complaint  Patient presents with   Fall   abnormal labs    Anthony Little is a 72 y.o. male.   Fall   72 year old male presents emergency department after a fall.  Patient states he had a fall 2 days ago when he stood up from a laying down position, began to feel dizzy/lightheaded and subsequently lost consciousness.  States that he fell backwards striking his head on the ground.  Patient is on Eliquis for history of atrial fibrillation.  Describes dizziness as room spinning type sensation.  States has had similar symptoms in the past with positional changes for the past 2 years and has been put on Dramamine for said symptoms.  Patient was seen by primary care earlier today and sent to the emergency department due to elevated potassium of 6.1 as well as for further imaging regarding patient's fall.  Patient reportedly lost consciousness for a few minutes before returning.  Denies any visual disturbance from baseline, gait abnormality from baseline, weakness/sensory deficits in upper or lower extremities, slurred speech, facial droop.  Denies any chest pain, shortness of breath, abdominal pain, upper or lower extremity pain, back pain, nausea, vomiting.  Past medical history significant for atrial fibrillation on Eliquis, abdominal aortic aneurysm, CKD stage III, CVA, hypertension, thoracic aortic aneurysm, hyperlipidemia, brain aneurysm, coronary artery disease  Home Medications Prior to Admission medications   Medication Sig Start Date End Date Taking? Authorizing Provider  albuterol (PROVENTIL) (2.5 MG/3ML) 0.083% nebulizer solution Take 3 mLs (2.5 mg total) by nebulization every 6 (six) hours as needed for wheezing or shortness of breath. 07/11/22   Coralyn Helling, MD  albuterol (VENTOLIN HFA) 108 (90 Base) MCG/ACT inhaler Inhale 2 puffs into  the lungs every 6 (six) hours as needed for wheezing or shortness of breath.    [provider]  buPROPion (WELLBUTRIN XL) 150 MG 24 hr tablet TAKE 1 TABLET BY MOUTH EVERY DAY Patient taking differently: Take 150 mg by mouth daily. 01/26/21   Babs Sciara, MD  ELIQUIS 5 MG TABS tablet TAKE 1 TABLET BY MOUTH TWICE A DAY 06/01/22   Jonelle Sidle, MD  fluticasone [facility]) 50 MCG/ACT nasal spray Place 1 spray into both nostrils at bedtime. 05/05/22   Coralyn Helling, MD  Fluticasone-Umeclidin-Vilant (TRELEGY ELLIPTA) 100-62.5-25 MCG/ACT AEPB Inhale 1 puff into the lungs daily in the afternoon. 07/11/22   Coralyn Helling, MD  Fluticasone-Umeclidin-Vilant (TRELEGY ELLIPTA) 100-62.5-25 MCG/ACT AEPB Inhale 1 puff into the lungs daily. 07/11/22   Coralyn Helling, MD  furosemide (LASIX) 20 MG tablet TAKE 1 TABLET BY MOUTH EVERY DAY 05/16/22   Babs Sciara, MD  glucosamine-chondroitin 500-400 MG tablet Take 2 tablets by mouth every morning.    [provider]  ipratropium (ATROVENT) 0.06 % nasal spray Place 2 sprays into both nostrils 4 (four) times daily. 06/13/22   Babs Sciara, MD  metoprolol succinate (TOPROL-XL) 25 MG 24 hr tablet TAKE 1 TABLET (25 MG TOTAL) BY MOUTH DAILY. 06/01/22   Babs Sciara, MD  Multiple Vitamin (MULTIVITAMIN WITH MINERALS) TABS tablet Take 1 tablet by mouth daily. 02/23/21   Johnson, Clanford L, MD  rosuvastatin (CRESTOR) 10 MG tablet TAKE 1 TABLET BY MOUTH EVERY DAY 07/13/22   Babs Sciara, MD  sertraline (ZOLOFT) 50 MG tablet TAKE 1 TABLET BY MOUTH EVERY DAY  07/13/22   Babs Sciara, MD  tamsulosin (FLOMAX) 0.4 MG CAPS capsule 1 each evening 01/16/22   Babs Sciara, MD      Allergies    Ivp dye [iodinated contrast media]    Review of Systems   Review of Systems  All other systems reviewed and are negative.   Physical Exam Updated Vital Signs BP 106/84 (BP Location: Right Arm)   Pulse 74   Temp 97.9 F (36.6 C) (Oral)   Resp (!) 23   Ht 5\' 10"   (1.778 m)   Wt 94.3 kg   SpO2 98%   BMI 29.84 kg/m  Physical Exam Vitals and nursing note reviewed.  Constitutional:      General: He is not in acute distress.    Appearance: He is well-developed.  HENT:     Head: Normocephalic.     Comments: Patient with small abrasion noted on posterior scalp.  No active bleeding appreciated.  Minimal swelling.    Right Ear: Tympanic membrane normal.     Left Ear: Tympanic membrane normal.  Eyes:     Conjunctiva/sclera: Conjunctivae normal.  Cardiovascular:     Rate and Rhythm: Normal rate. Rhythm irregular.     Heart sounds: No murmur heard. Pulmonary:     Effort: Pulmonary effort is normal. No respiratory distress.     Breath sounds: Normal breath sounds.  Abdominal:     Palpations: Abdomen is soft.     Tenderness: There is no abdominal tenderness.  Musculoskeletal:        General: No swelling.     Cervical back: Neck supple.     Comments: No midline tenderness of cervical, thoracic, lumbar spine with no obvious step-off or deformity noted.  No chest wall tenderness to palpation.  Patient moves all 4 extremities without difficulty.  No tenderness to palpation along upper or lower extremities.  Skin:    General: Skin is warm and dry.     Capillary Refill: Capillary refill takes less than 2 seconds.  Neurological:     Mental Status: He is alert.     Comments: Alert and oriented to self, place, time and event.   Speech is fluent, clear without dysarthria or dysphasia.   Strength symmetric in upper/lower extremities   Sensation intact in upper/lower extremities   Gait at baseline. CN I not tested  CN II not tested CN III, IV, VI PERRLA and EOMs intact bilaterally  CN V Intact sensation to sharp and light touch to the face  CN VII facial movements symmetric  CN VIII not tested  CN IX, X no uvula deviation, symmetric rise of soft palate  CN XI 5/5 SCM and trapezius strength bilaterally  CN XII Midline tongue protrusion, symmetric L/R  movements     Psychiatric:        Mood and Affect: Mood normal.     ED Results / Procedures / Treatments   Labs (all labs ordered are listed, but only abnormal results are displayed) Labs Reviewed  BASIC METABOLIC PANEL - Abnormal; Notable for the following components:      Result Value   Sodium 130 (*)    Chloride 97 (*)    CO2 21 (*)    Creatinine, Ser 1.61 (*)    Calcium 8.8 (*)    GFR, Estimated 46 (*)    All other components within normal limits  CBC - Abnormal; Notable for the following components:   RBC 3.21 (*)    Hemoglobin 11.5 (*)  HCT 35.4 (*)    MCV 110.3 (*)    MCH 35.8 (*)    RDW 15.9 (*)    Platelets 144 (*)    All other components within normal limits  ETHANOL  TROPONIN I (HIGH SENSITIVITY)    EKG None  Radiology CT Cervical Spine Wo Contrast  Result Date: 07/18/2022 CLINICAL DATA:  Neck trauma (Age >= 65y) Fall at home. EXAM: CT CERVICAL SPINE WITHOUT CONTRAST TECHNIQUE: Multidetector CT imaging of the cervical spine was performed without intravenous contrast. Multiplanar CT image reconstructions were also generated. RADIATION DOSE REDUCTION: This exam was performed according to the departmental dose-optimization program which includes automated exposure control, adjustment of the mA and/or kV according to patient size and/or use of iterative reconstruction technique. COMPARISON:  CT cervical spine 07/01/2022 FINDINGS: Alignment: Normal. Skull base and vertebrae: No acute fracture. Vertebral body heights are maintained. The dens and skull base are intact. Soft tissues and spinal canal: No prevertebral fluid or swelling. No visible canal hematoma. Disc levels: Disc space narrowing and spurring C5-C6 and to a lesser extent C4-C5. Multilevel facet hypertrophy. Upper chest: Nonacute. Other: Carotid calcifications. IMPRESSION: Degenerative change in the cervical spine without acute fracture or subluxation. Electronically Signed   By: Narda Rutherford M.D.   On:  07/18/2022 15:37   CT Head Wo Contrast  Result Date: 07/18/2022 CLINICAL DATA:  Head trauma, minor (Age >= 65y) Fall at home. EXAM: CT HEAD WITHOUT CONTRAST TECHNIQUE: Contiguous axial images were obtained from the base of the skull through the vertex without intravenous contrast. RADIATION DOSE REDUCTION: This exam was performed according to the departmental dose-optimization program which includes automated exposure control, adjustment of the mA and/or kV according to patient size and/or use of iterative reconstruction technique. COMPARISON:  Head CT 06/30/2020 FINDINGS: Brain: No intracranial hemorrhage, mass effect, or midline shift. Stable degree of atrophy and chronic small vessel ischemic change. No hydrocephalus. The basilar cisterns are patent. No evidence of territorial infarct or acute ischemia. No extra-axial or intracranial fluid collection. Vascular: Atherosclerosis of skullbase vasculature without hyperdense vessel or abnormal calcification. Skull: No fracture or focal lesion. Sinuses/Orbits: No acute findings. Bilateral cataract section. Minimal frothy debris within a posterior left ethmoid air cell. Other: Diminishing left parietal scalp hematoma. IMPRESSION: 1. No acute intracranial abnormality. No skull fracture. 2. Stable atrophy and chronic small vessel ischemic change. Electronically Signed   By: Narda Rutherford M.D.   On: 07/18/2022 15:35    Procedures Procedures    Medications Ordered in ED Medications  sodium chloride 0.9 % bolus 500 mL (500 mLs Intravenous Bolus 07/18/22 1516)    ED Course/ Medical Decision Making/ A&P                             Medical Decision Making Amount and/or Complexity of Data Reviewed Labs: ordered. Radiology: ordered.   This patient presents to the ED for concern of abnormal lab/fall/syncope, this involves an extensive number of treatment options, and is a complaint that carries with it a high risk of complications and morbidity.  The  differential diagnosis includes orthostatic hypotension, arrhythmia, vertigo, hyperkalemia   Co morbidities that complicate the patient evaluation  See HPI   Additional history obtained:  Additional history obtained from EMR External records from outside source obtained and reviewed including hospital records   Lab Tests:  I Ordered, and personally interpreted labs.  The pertinent results include: Patient with evidence of hyponatremia, hyperchloremia and  decreased bicarb of 130, 97 and 21 respectively supplemented via IV fluids.  Patient with baseline renal dysfunction with creatinine 1.61, BUN 20 GFR 46.  No leukocytosis noted.  Patient with evidence of anemia microcytic in nature of which is stable from prior laboratory studies performed.  Of cytopenia of 144 though which also seems to be near patient's baseline.  Ethanol negative.  Troponin of 7.   Imaging Studies ordered:  I ordered imaging studies including CT head/cervical spine I independently visualized and interpreted imaging which showed no acute intracranial abnormality.  No skull fracture.  Stable atrophy and chronic small vessel ischemic changes.  No acute fracture or subluxation of cervical spine. I agree with the radiologist interpretation  Cardiac Monitoring: / EKG:  The patient was maintained on a cardiac monitor.  I personally viewed and interpreted the cardiac monitored which showed an underlying rhythm of: Atrial fibrillation   Consultations Obtained:  I requested consultation with attending physician Dr. Jeraldine Loots who is in agreement treatment plan going forward.   Problem List / ED Course / Critical interventions / Medication management  Fall/head injury I ordered medication including 500 cc normal saline   Reevaluation of the patient after these medicines showed that the patient improved I have reviewed the patients home medicines and have made adjustments as needed   Social Determinants of  Health:  Chronic severe use.  Denies illicit drug use.   Test / Admission - Considered:  Fall/head injury/syncope Vitals signs significant for soft blood pressure of 93/77 initially which responded to intravenous fluids up to 106/84. Otherwise within normal range and stable throughout visit. Laboratory/imaging studies significant for: See above 72 year old male presents emergency department after fall and syncopal episode.  Patient with symptoms in the past related to orthostatic hypotension where he feels dizzy/lightheaded upon standing.  Similar situations seem to occur approximately 2 days ago.  Patient without acute neurologic deficit from episode but given history of use of Eliquis as well as trauma to head, CT imaging was performed which showed no acute abnormality from fall.  Patient found with some evidence of dehydration as well as mild hypotension which responded well to intravenous fluids.  Patient was also sent with concerns for possible hyperkalemia repeat BMP showed potassium within normal range.  Will recommend patient outpatient follow-up with primary care as well as standing up slowly from seated/laying down position.  Treatment plan discussed at length with patient and he acknowledged understanding was agreeable to said plan. Worrisome signs and symptoms were discussed with the patient, and the patient acknowledged understanding to return to the ED if noticed. Patient was stable upon discharge.          Final Clinical Impression(s) / ED Diagnoses Final diagnoses:  Fall, initial encounter  Injury of head, initial encounter    Rx / DC Orders ED Discharge Orders     None         Peter Garter, [place] 07/18/22 1659    Gerhard Munch, MD 07/19/22 1008

## 2022-07-18 NOTE — Discharge Instructions (Signed)
The workup today was overall reassuring.  CT imaging was negative for any acute abnormality from fall 2 days ago.  Your potassium was normal.  Recommend follow-up with primary care for reassessment of your symptoms.  Please do not hesitate to return to emergency department for worrisome signs and symptoms we discussed become apparent.

## 2022-07-18 NOTE — Progress Notes (Signed)
Subjective:    Patient ID: Anthony Little, male    DOB: [birth year], 72 y.o.   MRN: [id number]  HPI Patient arrives for a follow up. This patient unfortunately has had a couple different passing out spells over the past few weeks The most recent 1 occurred 2 days ago he was getting up out of a regular chair and within multiple steps passed out fell and hit his head and was out of consciousness for at least 5 minutes.  When he came back to his family helped him up.  He felt bad the rest of the day.  No vomiting.  Did have some headache.  Yesterday did not have any passing out spells.  Denies any chest pain shortness of breath.  Patient does admit to regular liquor use in the evening approximately 4 ounces but enough to be under the influence His wife is driving He recently had blood work ordered routine because of combination of fatigue, feeling lightheaded with standing up, history of renal disease, history of alcoholism  Lab work showed elevated potassium at 6.3 with creatinine GFR around 3   Wife would like left knee/leg checked he hit his knee on the toilet several days back it became very black and blue above and below.     Review of Systems     Objective:   Physical Exam  General-in no acute distress Eyes-no discharge Lungs-respiratory rate normal, CTA CV-no murmurs, irregular rhythm heart rate controlled Extremities skin warm dry no edema Neuro grossly normal Behavior normal, alert Severe bruising noted around the left knee no tenderness in the calf EKG no acute T wave changes atrial fibrillation noted     Assessment & Plan:  1. Syncope, unspecified syncope type This passing out sounds like it was a orthostatic hypotension.  On today's examination blood pressure laying sitting standing does not have appreciable change Patient would benefit from telemetry We will work with cardiology to see if they are willing to put in any type of 2-week or 30-day monitor to make  sure there is not a abnormal arrhythmia complicating this issue - EKG 12-Lead  2. Hyperkalemia Potassium is 6.3 I do not see any T wave changes.  It is possible that this elevated potassium is a spurious result.  Needs to be repeated - EKG 12-Lead  3. Alcohol abuse We have had a long discussion on this patient is interested in getting help we will work through [facility] services to try to get him some help if he is not able to quit through their services he may have to do a inpatient detox facility  4. Concussion with loss of consciousness of 30 minutes or less, initial encounter Given that he is on Eliquis and 2 days ago had concussion with loss of consciousness related to the fall it is imperative for him to get a CT scan to make sure he does not have a subdural hematoma  5. Stage 3b chronic kidney disease (HCC) Kidney function better if potassium still elevated more than likely initiate Lokelma or Veltassa  6. Anemia, unspecified type Some of the parameters point toward nutritional anemia macrocytic related to alcoholism some blood work still pending  7. Permanent atrial fibrillation (HCC) On Eliquis  Disposition-given the fact that he has had this call along with loss of consciousness just 2 days ago and he is on Eliquis we need to move forward with scan plus also has elevated potassium that needs to be rechecked right away it will be  impossible to try to coordinate this as an outpatient and get it successfully cleared by insurance for a stat CT scan therefore will refer to ER we will work closely with the patient upon discharge ER spoken with

## 2022-07-18 NOTE — ED Triage Notes (Signed)
Pt via POV sent from Dr. Fletcher Anon office (see EPIC note). Pt had a fall at home 2 days ago with +LOC. He takes eliquis. Per PCP, orthostatic hypotension suspected, ETOH abuse hx, K+ noted to be 6.1 and head CT recommended due to pt's loss of consciousness. BP 93/77 in triage. Pt is a/o x 4 and states he fell backwards and hit the back of his head. Wife in triage assisting with hx.

## 2022-07-19 ENCOUNTER — Telehealth: Payer: Self-pay | Admitting: Family Medicine

## 2022-07-19 NOTE — Telephone Encounter (Signed)
1.  Reach out is a patient 2.  Is that you know that a

## 2022-07-20 LAB — CBC WITH DIFFERENTIAL/PLATELET
Basos: 1 %
Eos: 2 %
Hemoglobin: 11.6 g/dL — ABNORMAL LOW (ref 13.0–17.7)
Immature Grans (Abs): 0 10*3/uL (ref 0.0–0.1)
Immature Granulocytes: 1 %
Lymphocytes Absolute: 1 10*3/uL (ref 0.7–3.1)
Lymphs: 16 %
MCH: 35.4 pg — ABNORMAL HIGH (ref 26.6–33.0)
MCV: 103 fL — ABNORMAL HIGH (ref 79–97)
Monocytes: 8 %
Neutrophils Absolute: 4.5 10*3/uL (ref 1.4–7.0)
Neutrophils: 72 %
Platelets: 163 10*3/uL (ref 150–450)
RBC: 3.28 x10E6/uL — ABNORMAL LOW (ref 4.14–5.80)
WBC: 6.2 10*3/uL (ref 3.4–10.8)

## 2022-07-20 LAB — CMP14+EGFR
ALT: 18 IU/L (ref 0–44)
Albumin/Globulin Ratio: 1.4 (ref 1.2–2.2)
Albumin: 4.1 g/dL (ref 3.9–4.9)
Alkaline Phosphatase: 113 IU/L (ref 44–121)
BUN/Creatinine Ratio: 14 (ref 10–24)
Bilirubin Total: 0.7 mg/dL (ref 0.0–1.2)
Creatinine, Ser: 1.53 mg/dL — ABNORMAL HIGH (ref 0.76–1.27)
Sodium: 134 mmol/L (ref 134–144)
Total Protein: 7.1 g/dL (ref 6.0–8.5)
eGFR: 49 mL/min/{1.73_m2} — ABNORMAL LOW (ref >59)

## 2022-07-20 LAB — HEMOGLOBIN A1C
Est. average glucose Bld gHb Est-mCnc: 100 mg/dL
Hgb A1c MFr Bld: 5.1 % (ref 4.8–5.6)

## 2022-07-20 LAB — VITAMIN B12: Vitamin B-12: 486 pg/mL (ref 232–1245)

## 2022-07-21 ENCOUNTER — Telehealth: Payer: Self-pay | Admitting: Pulmonary Disease

## 2022-07-21 ENCOUNTER — Encounter (INDEPENDENT_AMBULATORY_CARE_PROVIDER_SITE_OTHER): Payer: Medicare Other

## 2022-07-21 DIAGNOSIS — G4733 Obstructive sleep apnea (adult) (pediatric): Secondary | ICD-10-CM | POA: Diagnosis not present

## 2022-07-21 DIAGNOSIS — R0683 Snoring: Secondary | ICD-10-CM

## 2022-07-21 NOTE — Telephone Encounter (Signed)
HST 07/10/22 >> AHI 35.9, SpO2 low 78%.   Please inform him that his sleep study shows severe obstructive sleep apnea.  Please arrange for ROV with me or NP to discuss treatment options.  Okay to schedule as a video visit.

## 2022-07-24 NOTE — Telephone Encounter (Signed)
ATC pt LVM for pt to call office back 

## 2022-07-25 ENCOUNTER — Telehealth: Payer: Self-pay | Admitting: *Deleted

## 2022-07-25 ENCOUNTER — Telehealth: Payer: Self-pay

## 2022-07-25 NOTE — Telephone Encounter (Signed)
Transition Care Management Unsuccessful Follow-up Telephone Call  Date of discharge and from where:  07/18/2022 [facility]  Attempts:  1st Attempt  Reason for unsuccessful TCM follow-up call:  Left voice message  Nihira [facility]  [facility] Population Health Community Resource Care Guide   [email or web address]  ?? [phone]   Website: [email or web address]  [email or web address]

## 2022-07-25 NOTE — Telephone Encounter (Signed)
Called and spoke with patient. Patient verbalized understanding. Patient is scheduled to see Dr. Craige Cotta next week to discuss treatment options.   Nothing further needed.

## 2022-07-25 NOTE — Telephone Encounter (Signed)
Patient is returning a call regarding his test results.  Please call patient back to discuss at [phone]

## 2022-07-25 NOTE — Transitions of Care (Post Inpatient/ED Visit) (Signed)
07/25/2022  Name: Anthony Little MRN: [id number] DOB: 1950-11-23  Today's TOC FU Call Status: Today's TOC FU Call Status:: Successful TOC FU Call Competed TOC FU Call Complete Date: 07/25/22  Transition Care Management Follow-up Telephone Call Date of Discharge: 07/19/22 [facility] Type of Discharge: Emergency Department Reason for ED Visit: Other: (fall) How have you been since you were released from the hospital?: Better Any questions or concerns?: No  Items Reviewed: Did you receive and understand the discharge instructions provided?: Yes Medications obtained,verified, and reconciled?: Yes (Medications Reviewed) Any new allergies since your discharge?: No Dietary orders reviewed?: No Do you have support at home?: Yes People in Home: spouse Name of Support/Comfort Primary Source: Anthony Little  Medications Reviewed Today: Medications Reviewed Today     Reviewed by Margaretha Sheffield, RN (Registered Nurse) on 07/18/22 at 1219  Med List Status: <None>   Medication Order Taking? Sig Documenting Provider Last Dose Status Informant  albuterol (PROVENTIL) (2.5 MG/3ML) 0.083% nebulizer solution [id number]  Take 3 mLs (2.5 mg total) by nebulization every 6 (six) hours as needed for wheezing or shortness of breath. Coralyn Helling, MD  Active   albuterol (VENTOLIN HFA) 108 (90 Base) MCG/ACT inhaler [id number] No Inhale 2 puffs into the lungs every 6 (six) hours as needed for wheezing or shortness of breath. [provider] Taking Active   buPROPion (WELLBUTRIN XL) 150 MG 24 hr tablet [id number] No TAKE 1 TABLET BY MOUTH EVERY DAY  Patient taking differently: Take 150 mg by mouth daily.   Babs Sciara, MD Taking Active Spouse/Significant Other, Self, Pharmacy Records  ELIQUIS 5 MG TABS tablet [id number] No TAKE 1 TABLET BY MOUTH TWICE A DAY Jonelle Sidle, MD Taking Active   fluticasone [facility]) 50 MCG/ACT nasal spray [id number] No Place 1 spray into both  nostrils at bedtime. Coralyn Helling, MD Taking Active   Fluticasone-Umeclidin-Vilant (TRELEGY ELLIPTA) 100-62.5-25 MCG/ACT AEPB [id number]  Inhale 1 puff into the lungs daily in the afternoon. Coralyn Helling, MD  Active   Fluticasone-Umeclidin-Vilant (TRELEGY ELLIPTA) 100-62.5-25 MCG/ACT AEPB [id number]  Inhale 1 puff into the lungs daily. Coralyn Helling, MD  Active   furosemide (LASIX) 20 MG tablet [id number] No TAKE 1 TABLET BY MOUTH EVERY DAY Babs Sciara, MD Taking Active   glucosamine-chondroitin 500-400 MG tablet [id number] No Take 2 tablets by mouth every morning. [provider] Taking Active Self, Spouse/Significant Other, Pharmacy Records  ipratropium (ATROVENT) 0.06 % nasal spray [id number] No Place 2 sprays into both nostrils 4 (four) times daily. Babs Sciara, MD Taking Active   metoprolol succinate (TOPROL-XL) 25 MG 24 hr tablet [id number] No TAKE 1 TABLET (25 MG TOTAL) BY MOUTH DAILY. Babs Sciara, MD Taking Active   Multiple Vitamin (MULTIVITAMIN WITH MINERALS) TABS tablet [id number] No Take 1 tablet by mouth daily. Cleora Fleet, MD Taking Active Self, Spouse/Significant Other, Pharmacy Records  rosuvastatin (CRESTOR) 10 MG tablet [id number]  TAKE 1 TABLET BY MOUTH EVERY DAY Gerda Diss, Scott A, MD  Active   sertraline (ZOLOFT) 50 MG tablet [id number]  TAKE 1 TABLET BY MOUTH EVERY DAY Luking, Scott A, MD  Active   tamsulosin (FLOMAX) 0.4 MG CAPS capsule [id number] No 1 each evening Luking, Jonna Coup, MD Taking Active             Home Care and Equipment/Supplies: Were Home Health Services Ordered?: NA Any new equipment or medical supplies ordered?: NA  Functional Questionnaire: Do you need assistance with bathing/showering or dressing?:  No Do you need assistance with meal preparation?: No Do you need assistance with eating?: No Do you have difficulty maintaining continence: No Do you need assistance with getting out of bed/getting out of a chair/moving?: No Do you have  difficulty managing or taking your medications?: No  Follow up appointments reviewed: PCP Follow-up appointment confirmed?: No MD Provider Line Number:[phone] Given: Yes (Per patient he has set up an appt) Specialist Hospital Follow-up appointment confirmed?: Yes Date of Specialist follow-up appointment?: 07/21/22 Follow-Up Specialty Provider:: [facility] Do you need transportation to your follow-up appointment?: No Do you understand care options if your condition(s) worsen?: Yes-patient verbalized understanding  SDOH Interventions Today    Flowsheet Row Most Recent Value  SDOH Interventions   Food Insecurity Interventions Intervention Not Indicated  Transportation Interventions Intervention Not Indicated      Interventions Today    Flowsheet Row Most Recent Value  General Interventions   General Interventions Discussed/Reviewed General Interventions Discussed, General Interventions Reviewed, Doctor Visits  Doctor Visits Discussed/Reviewed Doctor Visits Reviewed        Gean Maidens BSN RN [facility] [phone]

## 2022-07-31 ENCOUNTER — Other Ambulatory Visit: Payer: Self-pay | Admitting: Pulmonary Disease

## 2022-08-01 ENCOUNTER — Ambulatory Visit (HOSPITAL_BASED_OUTPATIENT_CLINIC_OR_DEPARTMENT_OTHER): Payer: Medicare Other | Admitting: Pulmonary Disease

## 2022-08-17 ENCOUNTER — Encounter (HOSPITAL_BASED_OUTPATIENT_CLINIC_OR_DEPARTMENT_OTHER): Payer: Self-pay | Admitting: Pulmonary Disease

## 2022-08-17 ENCOUNTER — Ambulatory Visit (HOSPITAL_BASED_OUTPATIENT_CLINIC_OR_DEPARTMENT_OTHER): Payer: Medicare Other | Admitting: Pulmonary Disease

## 2022-08-17 VITALS — BP 136/84 | HR 88 | Ht 70.0 in | Wt 207.0 lb

## 2022-08-17 DIAGNOSIS — J4489 Other specified chronic obstructive pulmonary disease: Secondary | ICD-10-CM

## 2022-08-17 DIAGNOSIS — Z7189 Other specified counseling: Secondary | ICD-10-CM

## 2022-08-17 DIAGNOSIS — G4733 Obstructive sleep apnea (adult) (pediatric): Secondary | ICD-10-CM

## 2022-08-17 DIAGNOSIS — J439 Emphysema, unspecified: Secondary | ICD-10-CM

## 2022-08-17 DIAGNOSIS — Z716 Tobacco abuse counseling: Secondary | ICD-10-CM

## 2022-08-17 NOTE — Progress Notes (Signed)
[facility]  Chief Complaint  Patient presents with   Follow-up    F/U to discuss treatment options after sleep study    Past Surgical History:  He  has a past surgical history that includes Colonoscopy (12/2009); Esophagogastroduodenoscopy (N/A, 10/08/2012); Colonoscopy (N/A, 11/01/2015); Esophagogastroduodenoscopy (N/A, 11/01/2015); Colonoscopy with propofol (N/A, 08/17/2020); polypectomy (08/17/2020); biopsy (08/17/2020); Esophagogastroduodenoscopy (egd) with propofol (N/A, 02/21/2021); and biopsy (02/21/2021).  Past Medical History:  AAA, A fib, CKD 3b, CVA, HTN, HLD  Constitutional:  BP 136/84   Pulse 88   Ht 5\' 10"  (1.778 m)   Wt 207 lb (93.9 kg)   SpO2 99% Comment: on RA  BMI 29.70 kg/m   Brief Summary:  Anthony Little is a 72 y.o. male with COPD from emphysema, transudate Lt pleural effusion, and obstructive sleep apnea.      Subjective:   He is here with his wife.  Sleep study showed severe sleep apnea.  Trelegy helps.  Down to 1/4 ppd.  Doesn't do much activity.  Gets winded easily.  Still has cough and congestion, but sputum clear.  No wheeze.  Physical Exam:   Appearance - well kempt   ENMT - no sinus tenderness, no oral exudate, no LAN, Mallampati 3 airway, no stridor  Respiratory - decreased breath sounds bilaterally, no wheezing or rales  CV - s1s2 regular rate and rhythm, no murmurs  Ext - no clubbing, no edema  Skin - no rashes  Psych - normal mood and affect     Pulmonary testing:  PFT 08/30/21 >> FEV1 2.44 (74%), FEV1% 67, TLC 6.62 (94%), RV 3.12 (127%), DLCO 69%, +BD Lt thoracentesis 02/23/22 >> 1.4 liters, glucose 103, LDH 84, protein 4.3, WBC 332 (25M, 24N, 20L), cytology and culture negative  Chest Imaging:  CT angio chest 12/16/21 >> moderate centrilobular and paraseptal emphysema, stable and benign 4 mm LLL nodule CT chest 07/01/22 >> mod Lt effusion, mild emphysema  Sleep Tests:  HST  07/10/22 >> AHI 35.9, SpO2 low 78%.   Cardiac Tests:  Echo 03/03/22 >> EF 55 to 60%, mild LVH  Social History:  He  reports that he has been smoking cigarettes. He started smoking about 6 years ago. He has been smoking an average of .5 packs per day. He has never used smokeless tobacco. He reports current alcohol use of about 4.0 standard drinks of alcohol per week. He reports that he does not use drugs.  Family History:  His family history includes Diabetes in his maternal grandfather and mother; Parkinson's disease in his father.     Assessment/Plan:   COPD with emphysema. - continue trelegy 100 one puff daily - prn albuterol - he has a nebulizer  Tobacco abuse. - spent 6 minutes reviewing options to assist with smoking cessation  Obstructive sleep apnea. - reviewed his sleep study - discussed treatment options - discussed how sleep apnea can impact his health - will arrange for auto CPAP set up  Transudate left pleural effusion. - continue lasix  Allergic rhinitis. - prn azelastine  Persistent atrial fibrillation, Thoracic aortic aneurysm. - followed by Dr. Nona Dell with cardiology  Time Spent Involved in Patient Care on Day of Examination:  38 minutes  Follow up:   Patient Instructions  Will arrange for new CPAP set up  Follow up in 3 to 4 months  Medication List:   Allergies as of 08/17/2022       Reactions   Ivp Dye [iodinated Contrast  Media] Nausea And Vomiting        Medication List        Accurate as of August 17, 2022  2:50 PM. If you have any questions, ask your nurse or doctor.          STOP taking these medications    ipratropium 0.06 % nasal spray Commonly known as: ATROVENT Stopped by: Coralyn Helling, MD   tamsulosin 0.4 MG Caps capsule Commonly known as: FLOMAX Stopped by: Coralyn Helling, MD       TAKE these medications    albuterol 108 (90 Base) MCG/ACT inhaler Commonly known as: VENTOLIN HFA Inhale 2 puffs into the  lungs every 6 (six) hours as needed for wheezing or shortness of breath.   albuterol (2.5 MG/3ML) 0.083% nebulizer solution Commonly known as: PROVENTIL Take 3 mLs (2.5 mg total) by nebulization every 6 (six) hours as needed for wheezing or shortness of breath.   buPROPion 150 MG 24 hr tablet Commonly known as: WELLBUTRIN XL TAKE 1 TABLET BY MOUTH EVERY DAY   Eliquis 5 MG Tabs tablet Generic drug: apixaban TAKE 1 TABLET BY MOUTH TWICE A DAY   fluticasone 50 MCG/ACT nasal spray Commonly known as: FLONASE PLACE 1 SPRAY INTO BOTH NOSTRILS AT BEDTIME.   furosemide 20 MG tablet Commonly known as: LASIX TAKE 1 TABLET BY MOUTH EVERY DAY   glucosamine-chondroitin 500-400 MG tablet Take 2 tablets by mouth every morning.   metoprolol succinate 25 MG 24 hr tablet Commonly known as: TOPROL-XL TAKE 1 TABLET (25 MG TOTAL) BY MOUTH DAILY.   multivitamin with minerals Tabs tablet Take 1 tablet by mouth daily.   rosuvastatin 10 MG tablet Commonly known as: CRESTOR TAKE 1 TABLET BY MOUTH EVERY DAY   sertraline 50 MG tablet Commonly known as: ZOLOFT TAKE 1 TABLET BY MOUTH EVERY DAY   Trelegy Ellipta 100-62.5-25 MCG/ACT Aepb Generic drug: Fluticasone-Umeclidin-Vilant Inhale 1 puff into the lungs daily in the afternoon. What changed: Another medication with the same name was removed. Continue taking this medication, and follow the directions you see here. Changed by: Coralyn Helling, MD        Signature:  Coralyn Helling, MD [facility] Pulmonary/Critical Care Pager - [phone] 08/17/2022, 2:50 PM

## 2022-08-17 NOTE — Patient Instructions (Signed)
Will arrange for new CPAP set up  Follow up in 3 to 4 months

## 2022-08-18 NOTE — Telephone Encounter (Signed)
Error

## 2022-09-04 DIAGNOSIS — G4733 Obstructive sleep apnea (adult) (pediatric): Secondary | ICD-10-CM | POA: Diagnosis not present

## 2022-09-04 DIAGNOSIS — R112 Nausea with vomiting, unspecified: Secondary | ICD-10-CM | POA: Diagnosis not present

## 2022-09-04 DIAGNOSIS — R1310 Dysphagia, unspecified: Secondary | ICD-10-CM | POA: Diagnosis not present

## 2022-09-04 DIAGNOSIS — I4892 Unspecified atrial flutter: Secondary | ICD-10-CM | POA: Diagnosis not present

## 2022-09-11 DIAGNOSIS — J441 Chronic obstructive pulmonary disease with (acute) exacerbation: Secondary | ICD-10-CM | POA: Diagnosis not present

## 2022-09-22 ENCOUNTER — Other Ambulatory Visit: Payer: Self-pay | Admitting: Family Medicine

## 2022-09-22 MED ORDER — DOXYCYCLINE HYCLATE 100 MG PO TABS
100.0000 mg | ORAL_TABLET | Freq: Two times a day (BID) | ORAL | 0 refills | Status: DC
Start: 2022-09-22 — End: 2022-12-28

## 2022-09-25 ENCOUNTER — Other Ambulatory Visit: Payer: Self-pay

## 2022-09-25 DIAGNOSIS — N1832 Chronic kidney disease, stage 3b: Secondary | ICD-10-CM

## 2022-09-25 DIAGNOSIS — D539 Nutritional anemia, unspecified: Secondary | ICD-10-CM

## 2022-09-27 ENCOUNTER — Ambulatory Visit (HOSPITAL_COMMUNITY)
Admission: RE | Admit: 2022-09-27 | Discharge: 2022-09-27 | Disposition: A | Discharge status: Home / Self Care | Payer: Medicare Other | Source: Ambulatory Visit | Attending: Family Medicine | Admitting: Family Medicine

## 2022-09-27 DIAGNOSIS — R400 Somnolence: Secondary | ICD-10-CM | POA: Insufficient documentation

## 2022-09-27 DIAGNOSIS — R059 Cough, unspecified: Secondary | ICD-10-CM | POA: Diagnosis not present

## 2022-09-27 DIAGNOSIS — J90 Pleural effusion, not elsewhere classified: Secondary | ICD-10-CM | POA: Diagnosis not present

## 2022-09-27 DIAGNOSIS — R0602 Shortness of breath: Secondary | ICD-10-CM | POA: Diagnosis not present

## 2022-09-27 DIAGNOSIS — N1832 Chronic kidney disease, stage 3b: Secondary | ICD-10-CM | POA: Diagnosis not present

## 2022-09-27 DIAGNOSIS — D539 Nutritional anemia, unspecified: Secondary | ICD-10-CM | POA: Diagnosis not present

## 2022-09-27 DIAGNOSIS — J984 Other disorders of lung: Secondary | ICD-10-CM | POA: Diagnosis not present

## 2022-09-28 LAB — CBC WITH DIFFERENTIAL/PLATELET
Basophils Absolute: 0.1 10*3/uL (ref 0.0–0.2)
Basos: 2 %
EOS (ABSOLUTE): 0.3 10*3/uL (ref 0.0–0.4)
Eos: 6 %
Hematocrit: 37.1 % — ABNORMAL LOW (ref 37.5–51.0)
Hemoglobin: 12.8 g/dL — ABNORMAL LOW (ref 13.0–17.7)
Immature Grans (Abs): 0 10*3/uL (ref 0.0–0.1)
Immature Granulocytes: 0 %
Lymphocytes Absolute: 1.3 10*3/uL (ref 0.7–3.1)
Lymphs: 27 %
MCH: 36.9 pg — ABNORMAL HIGH (ref 26.6–33.0)
MCHC: 34.5 g/dL (ref 31.5–35.7)
MCV: 107 fL — ABNORMAL HIGH (ref 79–97)
Monocytes Absolute: 0.3 10*3/uL (ref 0.1–0.9)
Monocytes: 7 %
Neutrophils Absolute: 2.7 10*3/uL (ref 1.4–7.0)
Neutrophils: 58 %
Platelets: 140 10*3/uL — ABNORMAL LOW (ref 150–450)
RBC: 3.47 x10E6/uL — ABNORMAL LOW (ref 4.14–5.80)
RDW: 12.9 % (ref 11.6–15.4)
WBC: 4.6 10*3/uL (ref 3.4–10.8)

## 2022-09-28 LAB — BASIC METABOLIC PANEL
BUN/Creatinine Ratio: 11 (ref 10–24)
BUN: 16 mg/dL (ref 8–27)
CO2: 20 mmol/L (ref 20–29)
Calcium: 8.6 mg/dL (ref 8.6–10.2)
Chloride: 101 mmol/L (ref 96–106)
Creatinine, Ser: 1.49 mg/dL — ABNORMAL HIGH (ref 0.76–1.27)
Glucose: 74 mg/dL (ref 70–99)
Potassium: 4.6 mmol/L (ref 3.5–5.2)
Sodium: 139 mmol/L (ref 134–144)
eGFR: 50 mL/min/{1.73_m2} — ABNORMAL LOW (ref >59)

## 2022-09-28 LAB — MICROALBUMIN / CREATININE URINE RATIO
Creatinine, Urine: 210.6 mg/dL
Microalb/Creat Ratio: 21 mg/g creat (ref 0–29)
Microalbumin, Urine: 44.2 ug/mL

## 2022-09-28 LAB — FOLATE: Folate: 2.9 ng/mL — ABNORMAL LOW (ref >3.0)

## 2022-09-29 ENCOUNTER — Other Ambulatory Visit: Payer: Self-pay

## 2022-10-02 DIAGNOSIS — H02889 Meibomian gland dysfunction of unspecified eye, unspecified eyelid: Secondary | ICD-10-CM | POA: Diagnosis not present

## 2022-10-02 DIAGNOSIS — H16223 Keratoconjunctivitis sicca, not specified as Sjogren's, bilateral: Secondary | ICD-10-CM | POA: Diagnosis not present

## 2022-10-02 DIAGNOSIS — Z961 Presence of intraocular lens: Secondary | ICD-10-CM | POA: Diagnosis not present

## 2022-10-02 DIAGNOSIS — H26493 Other secondary cataract, bilateral: Secondary | ICD-10-CM | POA: Diagnosis not present

## 2022-10-04 DIAGNOSIS — G4733 Obstructive sleep apnea (adult) (pediatric): Secondary | ICD-10-CM | POA: Diagnosis not present

## 2022-10-04 DIAGNOSIS — R112 Nausea with vomiting, unspecified: Secondary | ICD-10-CM | POA: Diagnosis not present

## 2022-10-04 DIAGNOSIS — R1310 Dysphagia, unspecified: Secondary | ICD-10-CM | POA: Diagnosis not present

## 2022-10-04 DIAGNOSIS — I4892 Unspecified atrial flutter: Secondary | ICD-10-CM | POA: Diagnosis not present

## 2022-10-05 ENCOUNTER — Encounter: Payer: Self-pay | Admitting: Family Medicine

## 2022-10-05 NOTE — Telephone Encounter (Signed)
See phone message

## 2022-10-05 NOTE — Telephone Encounter (Signed)
Nurses I checked the system and unfortunately the radiologist has not read the x-ray yet.  Please call radiology and asked that this be read preferably soon so we can inform the family of the results.  Share this message with Ray and [name]  (The background reason for this-there is a Scientist, clinical (histocompatibility and immunogenetics) of radiologists-this is affecting how quickly scans and x-rays are read-unfortunately this could be a long-term problem.  [place] is trying to recruit radiologist to help out but the shortage nationwide is causing issues)  If they do not hear anything within the next 7 days please notify us again thank you

## 2022-10-05 NOTE — Telephone Encounter (Signed)
Called and spoke with patient's spouse, advised per Dr Lorin Picket. I checked the system and unfortunately the radiologist has not read the x-ray yet.  Please call radiology and asked that this be read preferably soon so we can inform the family of the results.  Share this message with Rosalia Hammers and [name]. If they do not hear anything within the next 7 days please notify us again thank you Patient's DPR verbalized understanding.

## 2022-10-11 ENCOUNTER — Other Ambulatory Visit: Payer: Self-pay | Admitting: Family Medicine

## 2022-10-11 NOTE — Addendum Note (Signed)
Addended by: Margaretha Sheffield on: 10/11/2022 10:29 AM   Modules accepted: Orders

## 2022-10-16 ENCOUNTER — Ambulatory Visit: Payer: Medicare Other | Admitting: Cardiology

## 2022-10-16 DIAGNOSIS — H26491 Other secondary cataract, right eye: Secondary | ICD-10-CM | POA: Diagnosis not present

## 2022-10-16 NOTE — Telephone Encounter (Signed)
Please verify with patient that he is on exam so you may refill these Very important for the patient to keep his follow-up with [place]

## 2022-10-16 NOTE — Progress Notes (Deleted)
    Cardiology Office Note  Date: 10/16/2022   ID: Anthony Little, DOB Dec 30, 1950, MRN [id number]  History of Present Illness: Anthony Little is a 72 y.o. male last seen in February.  Physical Exam: VS:  There were no vitals taken for this visit., BMI There is no height or weight on file to calculate BMI.  Wt Readings from Last 3 Encounters:  08/17/22 207 lb (93.9 kg)  07/18/22 208 lb (94.3 kg)  07/18/22 208 lb 12.8 oz (94.7 kg)    General: Patient appears comfortable at rest. HEENT: Conjunctiva and lids normal, oropharynx clear with moist mucosa. Neck: Supple, no elevated JVP or carotid bruits, no thyromegaly. Lungs: Clear to auscultation, nonlabored breathing at rest. Cardiac: Regular rate and rhythm, no S3 or significant systolic murmur, no pericardial rub. Abdomen: Soft, nontender, no hepatomegaly, bowel sounds present, no guarding or rebound. Extremities: No pitting edema, distal pulses 2+. Skin: Warm and dry. Musculoskeletal: No kyphosis. Neuropsychiatric: Alert and oriented x3, affect grossly appropriate.  ECG:  An ECG dated 07/18/2022 was personally reviewed today and demonstrated:  Rate controlled atrial fibrillation with IVCD.  Labwork: 01/03/2022: TSH 4.110 02/13/2022: B Natriuretic Peptide 646.0 03/03/2022: Magnesium 1.8 07/17/2022: ALT 18; AST 30 09/27/2022: BUN 16; Creatinine, Ser 1.49; Hemoglobin 12.8; Platelets 140; Potassium 4.6; Sodium 139     Component Value Date/Time   CHOL 173 10/13/2020 1437   TRIG 61 10/13/2020 1437   HDL 97 10/13/2020 1437   CHOLHDL 1.8 10/13/2020 1437   CHOLHDL 2.4 07/30/2012 1215   VLDL 11 07/30/2012 1215   LDLCALC 64 10/13/2020 1437   Other Studies Reviewed Today:  Echocardiogram 03/03/2022:  1. Left ventricular ejection fraction, by estimation, is 55 to 60%. The  left ventricle has normal function. The left ventricle has no regional  wall motion abnormalities. There is mild concentric left ventricular  hypertrophy. Left  ventricular diastolic  parameters are indeterminate.   2. Right ventricular systolic function is normal. The right ventricular  size is normal. Tricuspid regurgitation signal is inadequate for assessing  PA pressure.   3. Left atrial size was upper normal.   4. The mitral valve is grossly normal. Trivial mitral valve  regurgitation.   5. The aortic valve is tricuspid. There is mild calcification of the  aortic valve. Aortic valve regurgitation is not visualized. Aortic valve  sclerosis/calcification is present, without any evidence of aortic  stenosis. Aortic valve mean gradient  measures 1.0 mmHg.   6. The inferior vena cava is normal in size with <50% respiratory  variability, suggesting right atrial pressure of 8 mmHg.   Assessment and Plan:  1.  Persistent atrial fibrillation/flutter with CHA2DS2-VASc score of 5.   2.  Left pleural effusion status post thoracentesis in December 2023 as noted above.  LVEF remains normal by echocardiogram, 55 to 60% range and no major valvular abnormalities.  Chest CT in April indicated moderate sized left pleural effusion.   3.  Asymptomatic thoracic aortic aneurysm, stable at 3.5 cm by chest CTA in October 2023.   4.  Stage IIIb, last creatinine 1.53 and potassium 5.6.  Recommended that he stop potassium and use Lasix only as needed.  Disposition:  Follow up {follow up:15908}  Signed, Jonelle Sidle, M.D., F.A.C.C. [facility]

## 2022-11-04 DIAGNOSIS — G4733 Obstructive sleep apnea (adult) (pediatric): Secondary | ICD-10-CM | POA: Diagnosis not present

## 2022-11-07 ENCOUNTER — Ambulatory Visit (INDEPENDENT_AMBULATORY_CARE_PROVIDER_SITE_OTHER): Payer: Medicare Other | Admitting: Family Medicine

## 2022-11-07 ENCOUNTER — Encounter: Payer: Self-pay | Admitting: Family Medicine

## 2022-11-07 VITALS — BP 138/88 | HR 111 | Temp 98.4°F | Ht 70.0 in | Wt 202.0 lb

## 2022-11-07 DIAGNOSIS — N1832 Chronic kidney disease, stage 3b: Secondary | ICD-10-CM

## 2022-11-07 DIAGNOSIS — J90 Pleural effusion, not elsewhere classified: Secondary | ICD-10-CM | POA: Diagnosis not present

## 2022-11-07 MED ORDER — LISINOPRIL 2.5 MG PO TABS
2.5000 mg | ORAL_TABLET | Freq: Every day | ORAL | 3 refills | Status: DC
Start: 2022-11-07 — End: 2022-12-29

## 2022-11-07 NOTE — Progress Notes (Unsigned)
   Subjective:    Patient ID: Anthony Little, male    DOB: 1950/11/02, 72 y.o.   MRN: [id number]  HPI Follow up  Patient here for follow-up States he is overall doing better Does get fatigued when he pushes himself Denies any type of headaches nausea vomiting No coughing Still smokes But he is pretty much cut out alcohol His moods are doing well he is sleeping well he does state he gets a little winded when he pushes himself but not severe   Review of Systems     Objective:   Physical Exam  General-in no acute distress Eyes-no discharge Lungs-respiratory rate normal, CTA CV-no murmurs,RRR Extremities skin warm dry no edema Neuro grossly normal Behavior normal, alert       Assessment & Plan:   1. Stage 3b chronic kidney disease (HCC) I am concerned about his blood pressure could be better.  I would recommend starting low-dose lisinopril Recheck lab work again in approximately 7 to 10 days Follow-up again within 3 months time to recheck blood pressure If any problems notify us - Basic Metabolic Panel  2. Pleural effusion Also recommend him to follow-up with pulmonary We will send pulmonary a message to see if they feel that he needs [place] to set him up for interventional radiology or just leave well enough alone his shortness of breath is not severe

## 2022-11-10 ENCOUNTER — Other Ambulatory Visit: Payer: Self-pay

## 2022-11-10 DIAGNOSIS — J90 Pleural effusion, not elsewhere classified: Secondary | ICD-10-CM

## 2022-11-12 DIAGNOSIS — J441 Chronic obstructive pulmonary disease with (acute) exacerbation: Secondary | ICD-10-CM | POA: Diagnosis not present

## 2022-11-13 ENCOUNTER — Telehealth: Payer: Self-pay | Admitting: Family Medicine

## 2022-11-13 NOTE — Telephone Encounter (Signed)
Nurses Please touch base with patient I did touch base with his pulmonologist regarding this pleural effusion.  Pulmonologist agrees that the pleural effusion is small compared to what it was previously.  He does not recommend pleurocentesis at this time.  Patient does have a follow-up visit with me later in September please keep that visit  (Side note-pulmonologist did state we could go up on the Lasix but when I saw the patient he did not have extra fluid in his ankles and his creatinine is elevated therefore we will hold Lasix at the current dose)

## 2022-11-14 NOTE — Telephone Encounter (Signed)
Patient advised per drs recommendations and pulmonologist recommendations. Patient verbalizes  understanding

## 2022-11-24 ENCOUNTER — Other Ambulatory Visit: Payer: Self-pay | Admitting: Family Medicine

## 2022-11-24 DIAGNOSIS — I4821 Permanent atrial fibrillation: Secondary | ICD-10-CM

## 2022-12-05 ENCOUNTER — Ambulatory Visit: Payer: Medicare Other | Admitting: Family Medicine

## 2022-12-08 ENCOUNTER — Ambulatory Visit: Payer: Medicare Other | Attending: Cardiology | Admitting: Cardiology

## 2022-12-12 DIAGNOSIS — J441 Chronic obstructive pulmonary disease with (acute) exacerbation: Secondary | ICD-10-CM | POA: Diagnosis not present

## 2022-12-13 ENCOUNTER — Other Ambulatory Visit: Payer: Self-pay | Admitting: Family Medicine

## 2022-12-28 ENCOUNTER — Emergency Department (HOSPITAL_COMMUNITY): Payer: Medicare Other

## 2022-12-28 ENCOUNTER — Encounter (HOSPITAL_COMMUNITY): Payer: Self-pay | Admitting: Emergency Medicine

## 2022-12-28 ENCOUNTER — Other Ambulatory Visit: Payer: Self-pay

## 2022-12-28 ENCOUNTER — Observation Stay (HOSPITAL_COMMUNITY)
Admission: EM | Admit: 2022-12-28 | Discharge: 2022-12-29 | Disposition: A | Discharge status: Home / Self Care | Payer: Medicare Other | Attending: Family Medicine | Admitting: Family Medicine

## 2022-12-28 DIAGNOSIS — I10 Essential (primary) hypertension: Secondary | ICD-10-CM | POA: Diagnosis present

## 2022-12-28 DIAGNOSIS — J438 Other emphysema: Secondary | ICD-10-CM | POA: Diagnosis not present

## 2022-12-28 DIAGNOSIS — I959 Hypotension, unspecified: Secondary | ICD-10-CM | POA: Diagnosis not present

## 2022-12-28 DIAGNOSIS — E871 Hypo-osmolality and hyponatremia: Secondary | ICD-10-CM | POA: Insufficient documentation

## 2022-12-28 DIAGNOSIS — I4819 Other persistent atrial fibrillation: Secondary | ICD-10-CM | POA: Insufficient documentation

## 2022-12-28 DIAGNOSIS — R918 Other nonspecific abnormal finding of lung field: Secondary | ICD-10-CM | POA: Diagnosis not present

## 2022-12-28 DIAGNOSIS — N183 Chronic kidney disease, stage 3 unspecified: Secondary | ICD-10-CM | POA: Diagnosis present

## 2022-12-28 DIAGNOSIS — D509 Iron deficiency anemia, unspecified: Secondary | ICD-10-CM | POA: Insufficient documentation

## 2022-12-28 DIAGNOSIS — J449 Chronic obstructive pulmonary disease, unspecified: Secondary | ICD-10-CM | POA: Diagnosis present

## 2022-12-28 DIAGNOSIS — Z7901 Long term (current) use of anticoagulants: Secondary | ICD-10-CM | POA: Diagnosis not present

## 2022-12-28 DIAGNOSIS — N179 Acute kidney failure, unspecified: Secondary | ICD-10-CM | POA: Diagnosis not present

## 2022-12-28 DIAGNOSIS — R531 Weakness: Secondary | ICD-10-CM | POA: Diagnosis present

## 2022-12-28 DIAGNOSIS — I4891 Unspecified atrial fibrillation: Secondary | ICD-10-CM | POA: Diagnosis present

## 2022-12-28 DIAGNOSIS — D6489 Other specified anemias: Secondary | ICD-10-CM | POA: Diagnosis present

## 2022-12-28 DIAGNOSIS — F17210 Nicotine dependence, cigarettes, uncomplicated: Secondary | ICD-10-CM | POA: Insufficient documentation

## 2022-12-28 DIAGNOSIS — I129 Hypertensive chronic kidney disease with stage 1 through stage 4 chronic kidney disease, or unspecified chronic kidney disease: Secondary | ICD-10-CM | POA: Diagnosis not present

## 2022-12-28 DIAGNOSIS — N1831 Chronic kidney disease, stage 3a: Secondary | ICD-10-CM | POA: Insufficient documentation

## 2022-12-28 DIAGNOSIS — E782 Mixed hyperlipidemia: Secondary | ICD-10-CM | POA: Diagnosis present

## 2022-12-28 DIAGNOSIS — I25110 Atherosclerotic heart disease of native coronary artery with unstable angina pectoris: Secondary | ICD-10-CM | POA: Insufficient documentation

## 2022-12-28 DIAGNOSIS — E785 Hyperlipidemia, unspecified: Secondary | ICD-10-CM | POA: Diagnosis present

## 2022-12-28 DIAGNOSIS — F1010 Alcohol abuse, uncomplicated: Secondary | ICD-10-CM | POA: Diagnosis present

## 2022-12-28 DIAGNOSIS — J90 Pleural effusion, not elsewhere classified: Secondary | ICD-10-CM | POA: Diagnosis not present

## 2022-12-28 DIAGNOSIS — J189 Pneumonia, unspecified organism: Secondary | ICD-10-CM | POA: Insufficient documentation

## 2022-12-28 DIAGNOSIS — Z8673 Personal history of transient ischemic attack (TIA), and cerebral infarction without residual deficits: Secondary | ICD-10-CM | POA: Diagnosis not present

## 2022-12-28 DIAGNOSIS — I4821 Permanent atrial fibrillation: Secondary | ICD-10-CM

## 2022-12-28 DIAGNOSIS — J984 Other disorders of lung: Secondary | ICD-10-CM | POA: Diagnosis not present

## 2022-12-28 DIAGNOSIS — F1020 Alcohol dependence, uncomplicated: Secondary | ICD-10-CM | POA: Diagnosis present

## 2022-12-28 DIAGNOSIS — I25119 Atherosclerotic heart disease of native coronary artery with unspecified angina pectoris: Secondary | ICD-10-CM | POA: Diagnosis present

## 2022-12-28 HISTORY — DX: Alcohol dependence, uncomplicated: F10.20

## 2022-12-28 LAB — CBC
HCT: 38.3 % — ABNORMAL LOW (ref 39.0–52.0)
Hemoglobin: 12.7 g/dL — ABNORMAL LOW (ref 13.0–17.0)
MCH: 36.5 pg — ABNORMAL HIGH (ref 26.0–34.0)
MCHC: 33.2 g/dL (ref 30.0–36.0)
MCV: 110.1 fL — ABNORMAL HIGH (ref 80.0–100.0)
Platelets: 245 10*3/uL (ref 150–400)
RBC: 3.48 MIL/uL — ABNORMAL LOW (ref 4.22–5.81)
RDW: 13.2 % (ref 11.5–15.5)
WBC: 5.9 10*3/uL (ref 4.0–10.5)
nRBC: 0 % (ref 0.0–0.2)

## 2022-12-28 LAB — COMPREHENSIVE METABOLIC PANEL
ALT: 21 U/L (ref 0–44)
AST: 34 U/L (ref 15–41)
Albumin: 3.3 g/dL — ABNORMAL LOW (ref 3.5–5.0)
Alkaline Phosphatase: 90 U/L (ref 38–126)
Anion gap: 13 (ref 5–15)
BUN: 23 mg/dL (ref 8–23)
CO2: 20 mmol/L — ABNORMAL LOW (ref 22–32)
Calcium: 8.4 mg/dL — ABNORMAL LOW (ref 8.9–10.3)
Chloride: 98 mmol/L (ref 98–111)
Creatinine, Ser: 2.28 mg/dL — ABNORMAL HIGH (ref 0.61–1.24)
GFR, Estimated: 30 mL/min — ABNORMAL LOW (ref >60)
Glucose, Bld: 87 mg/dL (ref 70–99)
Potassium: 4.2 mmol/L (ref 3.5–5.1)
Sodium: 131 mmol/L — ABNORMAL LOW (ref 135–145)
Total Bilirubin: 0.6 mg/dL (ref 0.3–1.2)
Total Protein: 7.1 g/dL (ref 6.5–8.1)

## 2022-12-28 LAB — LACTIC ACID, PLASMA: Lactic Acid, Venous: 1.8 mmol/L (ref 0.5–1.9)

## 2022-12-28 LAB — TROPONIN I (HIGH SENSITIVITY)
Troponin I (High Sensitivity): 10 ng/L (ref <18)
Troponin I (High Sensitivity): 8 ng/L (ref <18)

## 2022-12-28 LAB — LIPASE, BLOOD: Lipase: 48 U/L (ref 11–51)

## 2022-12-28 MED ORDER — ACETAMINOPHEN 325 MG PO TABS: 650.0000 mg | ORAL_TABLET | Freq: Four times a day (QID) | ORAL | Status: DC | PRN

## 2022-12-28 MED ORDER — APIXABAN 5 MG PO TABS
5.0000 mg | ORAL_TABLET | Freq: Two times a day (BID) | ORAL | Status: DC
  Administered 2022-12-28 – 2022-12-29 (×2): 5 mg via ORAL
  Filled 2022-12-28 (×2): qty 1

## 2022-12-28 MED ORDER — NICOTINE 14 MG/24HR TD PT24
14.0000 mg | MEDICATED_PATCH | Freq: Every day | TRANSDERMAL | Status: DC
  Administered 2022-12-28: 14 mg via TRANSDERMAL
  Filled 2022-12-28 (×2): qty 1

## 2022-12-28 MED ORDER — THIAMINE MONONITRATE 100 MG PO TABS
100.0000 mg | ORAL_TABLET | Freq: Every day | ORAL | Status: DC
  Administered 2022-12-28 – 2022-12-29 (×2): 100 mg via ORAL
  Filled 2022-12-28 (×2): qty 1

## 2022-12-28 MED ORDER — DEXTROSE IN LACTATED RINGERS 5 % IV SOLN: INTRAVENOUS | Status: DC

## 2022-12-28 MED ORDER — DEXTROSE-SODIUM CHLORIDE 5-0.45 % IV SOLN
INTRAVENOUS | Status: DC
Start: 2022-12-28 — End: 2022-12-28

## 2022-12-28 MED ORDER — ALBUTEROL SULFATE (2.5 MG/3ML) 0.083% IN NEBU: 2.5000 mg | INHALATION_SOLUTION | Freq: Four times a day (QID) | RESPIRATORY_TRACT | Status: DC | PRN

## 2022-12-28 MED ORDER — DM-GUAIFENESIN ER 30-600 MG PO TB12
1.0000 | ORAL_TABLET | Freq: Two times a day (BID) | ORAL | Status: DC
  Administered 2022-12-28 – 2022-12-29 (×2): 1 via ORAL
  Filled 2022-12-28 (×2): qty 1

## 2022-12-28 MED ORDER — SODIUM CHLORIDE 0.9 % IV BOLUS
1000.0000 mL | Freq: Once | INTRAVENOUS | Status: AC
  Administered 2022-12-28: 1000 mL via INTRAVENOUS

## 2022-12-28 MED ORDER — SODIUM CHLORIDE 0.9% FLUSH
3.0000 mL | Freq: Two times a day (BID) | INTRAVENOUS | Status: DC
  Administered 2022-12-28 – 2022-12-29 (×2): 3 mL via INTRAVENOUS

## 2022-12-28 MED ORDER — UMECLIDINIUM BROMIDE 62.5 MCG/ACT IN AEPB
1.0000 | INHALATION_SPRAY | Freq: Every day | RESPIRATORY_TRACT | Status: DC
  Administered 2022-12-29: 1 via RESPIRATORY_TRACT
  Filled 2022-12-28: qty 7

## 2022-12-28 MED ORDER — ALBUTEROL SULFATE (2.5 MG/3ML) 0.083% IN NEBU: 2.5000 mg | INHALATION_SOLUTION | RESPIRATORY_TRACT | Status: DC | PRN

## 2022-12-28 MED ORDER — SERTRALINE HCL 50 MG PO TABS
50.0000 mg | ORAL_TABLET | Freq: Every day | ORAL | Status: DC
  Administered 2022-12-28 – 2022-12-29 (×2): 50 mg via ORAL
  Filled 2022-12-28 (×2): qty 1

## 2022-12-28 MED ORDER — SODIUM CHLORIDE 0.9 % IV SOLN
2.0000 g | INTRAVENOUS | Status: DC
  Administered 2022-12-28: 2 g via INTRAVENOUS
  Filled 2022-12-28: qty 20

## 2022-12-28 MED ORDER — FLUTICASONE FUROATE-VILANTEROL 100-25 MCG/ACT IN AEPB
1.0000 | INHALATION_SPRAY | Freq: Every day | RESPIRATORY_TRACT | Status: DC
  Administered 2022-12-29: 1 via RESPIRATORY_TRACT
  Filled 2022-12-28: qty 28

## 2022-12-28 MED ORDER — BISACODYL 10 MG RE SUPP: 10.0000 mg | Freq: Every day | RECTAL | Status: DC | PRN

## 2022-12-28 MED ORDER — IPRATROPIUM-ALBUTEROL 0.5-2.5 (3) MG/3ML IN SOLN: 3.0000 mL | RESPIRATORY_TRACT | Status: DC | PRN

## 2022-12-28 MED ORDER — ADULT MULTIVITAMIN W/MINERALS CH
1.0000 | ORAL_TABLET | Freq: Every day | ORAL | Status: DC
  Administered 2022-12-28 – 2022-12-29 (×2): 1 via ORAL
  Filled 2022-12-28 (×2): qty 1

## 2022-12-28 MED ORDER — FOLIC ACID 1 MG PO TABS
1.0000 mg | ORAL_TABLET | Freq: Every day | ORAL | Status: DC
  Administered 2022-12-28 – 2022-12-29 (×2): 1 mg via ORAL
  Filled 2022-12-28 (×2): qty 1

## 2022-12-28 MED ORDER — NICOTINE 21 MG/24HR TD PT24: 21.0000 mg | MEDICATED_PATCH | Freq: Every day | TRANSDERMAL | Status: DC

## 2022-12-28 MED ORDER — IPRATROPIUM-ALBUTEROL 0.5-2.5 (3) MG/3ML IN SOLN
3.0000 mL | Freq: Four times a day (QID) | RESPIRATORY_TRACT | Status: DC
  Administered 2022-12-28: 3 mL via RESPIRATORY_TRACT
  Filled 2022-12-28: qty 3

## 2022-12-28 MED ORDER — IPRATROPIUM-ALBUTEROL 0.5-2.5 (3) MG/3ML IN SOLN: 3.0000 mL | Freq: Three times a day (TID) | RESPIRATORY_TRACT | Status: DC

## 2022-12-28 MED ORDER — BUPROPION HCL ER (XL) 150 MG PO TB24
150.0000 mg | ORAL_TABLET | Freq: Every day | ORAL | Status: DC
  Administered 2022-12-28 – 2022-12-29 (×2): 150 mg via ORAL
  Filled 2022-12-28 (×2): qty 1

## 2022-12-28 MED ORDER — THIAMINE HCL 100 MG/ML IJ SOLN: 100.0000 mg | Freq: Every day | INTRAMUSCULAR | Status: DC

## 2022-12-28 MED ORDER — SODIUM CHLORIDE 0.9 % IV BOLUS: 1000.0000 mL | Freq: Once | INTRAVENOUS | Status: DC

## 2022-12-28 MED ORDER — DEXTROSE-SODIUM CHLORIDE 5-0.9 % IV SOLN: INTRAVENOUS | Status: DC

## 2022-12-28 MED ORDER — ONDANSETRON HCL 4 MG PO TABS: 4.0000 mg | ORAL_TABLET | Freq: Four times a day (QID) | ORAL | Status: DC | PRN

## 2022-12-28 MED ORDER — ROSUVASTATIN CALCIUM 10 MG PO TABS
10.0000 mg | ORAL_TABLET | Freq: Every day | ORAL | Status: DC
  Administered 2022-12-28 – 2022-12-29 (×2): 10 mg via ORAL
  Filled 2022-12-28 (×2): qty 1

## 2022-12-28 MED ORDER — SODIUM CHLORIDE 0.9% FLUSH: 3.0000 mL | INTRAVENOUS | Status: DC | PRN

## 2022-12-28 MED ORDER — ACETAMINOPHEN 650 MG RE SUPP: 650.0000 mg | Freq: Four times a day (QID) | RECTAL | Status: DC | PRN

## 2022-12-28 MED ORDER — LORAZEPAM 1 MG PO TABS: 1.0000 mg | ORAL_TABLET | ORAL | Status: DC | PRN

## 2022-12-28 MED ORDER — POLYETHYLENE GLYCOL 3350 17 G PO PACK: 17.0000 g | PACK | Freq: Every day | ORAL | Status: DC | PRN

## 2022-12-28 MED ORDER — ONDANSETRON HCL 4 MG/2ML IJ SOLN: 4.0000 mg | Freq: Four times a day (QID) | INTRAMUSCULAR | Status: DC | PRN

## 2022-12-28 MED ORDER — IPRATROPIUM-ALBUTEROL 0.5-2.5 (3) MG/3ML IN SOLN: 3.0000 mL | Freq: Four times a day (QID) | RESPIRATORY_TRACT | Status: DC | PRN

## 2022-12-28 MED ORDER — TRAZODONE HCL 50 MG PO TABS: 50.0000 mg | ORAL_TABLET | Freq: Every evening | ORAL | Status: DC | PRN

## 2022-12-28 MED ORDER — ENSURE ENLIVE PO LIQD: 237.0000 mL | Freq: Two times a day (BID) | ORAL | Status: DC

## 2022-12-28 MED ORDER — LACTATED RINGERS IV BOLUS: 1000.0000 mL | Freq: Once | INTRAVENOUS | Status: DC

## 2022-12-28 MED ORDER — LORAZEPAM 2 MG/ML IJ SOLN: 1.0000 mg | INTRAMUSCULAR | Status: DC | PRN

## 2022-12-28 MED ORDER — DOXYCYCLINE HYCLATE 100 MG PO TABS
100.0000 mg | ORAL_TABLET | Freq: Two times a day (BID) | ORAL | Status: DC
  Administered 2022-12-28 – 2022-12-29 (×2): 100 mg via ORAL
  Filled 2022-12-28 (×2): qty 1

## 2022-12-28 NOTE — ED Notes (Signed)
ED TO INPATIENT HANDOFF REPORT  ED Nurse Name and Phone #: Wandra Mannan, Paramedic   S Name/Age/Gender Anthony Little 72 y.o. male Room/Bed: APA19/APA19  Code Status   Code Status: Full Code  Home/SNF/Other Home Patient oriented to: self, place, time, and situation Is this baseline? Yes   Triage Complete: Triage complete  Chief Complaint Symptomatic hypotension [I95.9]  Triage Note Pt wife brought pt in due to confusion last night and increased weakness at 9pm. After waking this am pt was so weak he could not stand alone and was more confused with slurred speech. Pt arrived in private vehicle with obvious weakness noted and needed 2 assist to get in bed. Alert/oriented to most. Wife states is a daily whiskey drinker and hasn't had any alcohol in 2 days. Pt brought immediatly back and iv started due to low bp. Edp aawre   Allergies Allergies  Allergen Reactions   Ivp Dye [Iodinated Contrast Media] Nausea And Vomiting    Level of Care/Admitting Diagnosis ED Disposition     ED Disposition  Admit   Condition  --   Comment  Hospital Area: [facility] [[id number]]  Level of Care: Telemetry [5]  Covid Evaluation: Asymptomatic - no recent exposure (last 10 days) testing not required  Diagnosis: Symptomatic hypotension [1610960]  Admitting Physician: Marylyn Ishihara  Attending Physician: Marylyn Ishihara          B Medical/Surgery History Past Medical History:  Diagnosis Date   Abdominal aortic aneurysm (AAA) (HCC)    Alcoholic (HCC)    Atrial fibrillation and flutter (HCC)    CKD (chronic kidney disease) stage 3, GFR 30-59 ml/min (HCC)    CVA (cerebral infarction)    Essential hypertension    Hyperlipidemia    Thoracic aortic aneurysm [facility])    Past Surgical History:  Procedure Laterality Date   BIOPSY  08/17/2020   Procedure: BIOPSY;  Surgeon: Lanelle Bal, DO;  [facility];  Service: Endoscopy;;   BIOPSY   02/21/2021   Procedure: BIOPSY;  Surgeon: Lanelle Bal, DO;  [facility];  Service: Endoscopy;;   COLONOSCOPY  12/2009   Diverticulosis, mild internal hemorrhoids, multiple colon polyps removed (adenomatous). One irregular shaped polyp was tattooed. Next colonoscopy due October 2016.   COLONOSCOPY N/A 11/01/2015   Procedure: COLONOSCOPY;  Surgeon: West Bali, MD;  Location: [facility];  Service: Endoscopy;  Laterality: N/A;  100   COLONOSCOPY WITH PROPOFOL N/A 08/17/2020   Procedure: COLONOSCOPY WITH PROPOFOL;  Surgeon: Lanelle Bal, DO;  [facility];  Service: Endoscopy;  Laterality: N/A;  2:15pm   ESOPHAGOGASTRODUODENOSCOPY N/A 10/08/2012   SLF: Moderate erosive gastritis/duodenitis. Biopsies benign.   ESOPHAGOGASTRODUODENOSCOPY N/A 11/01/2015   Procedure: ESOPHAGOGASTRODUODENOSCOPY (EGD);  Surgeon: West Bali, MD;  [facility];  Service: Endoscopy;  Laterality: N/A;   ESOPHAGOGASTRODUODENOSCOPY (EGD) WITH PROPOFOL N/A 02/21/2021   Procedure: ESOPHAGOGASTRODUODENOSCOPY (EGD) WITH PROPOFOL;  Surgeon: Lanelle Bal, DO;  [facility];  Service: Endoscopy;  Laterality: N/A;   POLYPECTOMY  08/17/2020   Procedure: POLYPECTOMY;  Surgeon: Lanelle Bal, DO;  [facility];  Service: Endoscopy;;     A IV Location/Drains/Wounds Patient Lines/Drains/Airways Status     Active Line/Drains/Airways     Name Placement date Placement time Site Days   Peripheral IV 12/28/22 20 G 1" Anterior;Distal;Right Forearm 12/28/22  1036  Forearm  less than 1   Peripheral IV 12/28/22 20 G  1.88" Anterior;Left;Upper Arm 12/28/22  1137  Arm  less than 1            Intake/Output Last 24 hours  Intake/Output Summary (Last 24 hours) at 12/28/2022 1426 Last data filed at 12/28/2022 1137 Gross per 24 hour  Intake 1000 ml  Output --  Net 1000 ml    Labs/Imaging Results for orders placed or performed during the hospital encounter  of 12/28/22 (from the past 48 hour(s))  CBC     Status: Abnormal   Collection Time: 12/28/22 10:18 AM  Result Value Ref Range   WBC 5.9 4.0 - 10.5 K/uL   RBC 3.48 (L) 4.22 - 5.81 MIL/uL   Hemoglobin 12.7 (L) 13.0 - 17.0 g/dL   HCT 40.9 (L) 81.1 - 91.4 %   MCV 110.1 (H) 80.0 - 100.0 fL   MCH 36.5 (H) 26.0 - 34.0 pg   MCHC 33.2 30.0 - 36.0 g/dL   RDW 78.2 95.6 - 21.3 %   Platelets 245 150 - 400 K/uL   nRBC 0.0 0.0 - 0.2 %    Comment: Performed at [facility], [address] [place], [place] [address]  Comprehensive metabolic panel     Status: Abnormal   Collection Time: 12/28/22 10:18 AM  Result Value Ref Range   Sodium 131 (L) 135 - 145 mmol/L   Potassium 4.2 3.5 - 5.1 mmol/L   Chloride 98 98 - 111 mmol/L   CO2 20 (L) 22 - 32 mmol/L   Glucose, Bld 87 70 - 99 mg/dL    Comment: Glucose reference range applies only to samples taken after fasting for at least 8 hours.   BUN 23 8 - 23 mg/dL   Creatinine, Ser 8.46 (H) 0.61 - 1.24 mg/dL   Calcium 8.4 (L) 8.9 - 10.3 mg/dL   Total Protein 7.1 6.5 - 8.1 g/dL   Albumin 3.3 (L) 3.5 - 5.0 g/dL   AST 34 15 - 41 U/L   ALT 21 0 - 44 U/L   Alkaline Phosphatase 90 38 - 126 U/L   Total Bilirubin 0.6 0.3 - 1.2 mg/dL   GFR, Estimated 30 (L) >60 mL/min    Comment: (NOTE) Calculated using the CKD-EPI Creatinine Equation (2021)    Anion gap 13 5 - 15    Comment: Performed at [facility], [address] [place], [place] [address]  Lipase, blood     Status: None   Collection Time: 12/28/22 10:18 AM  Result Value Ref Range   Lipase 48 11 - 51 U/L    Comment: Performed at [facility], [address] [place], [place] [address]  Troponin I (High Sensitivity)     Status: None   Collection Time: 12/28/22 10:18 AM  Result Value Ref Range   Troponin I (High Sensitivity) 10 <18 ng/L    Comment: (NOTE) Elevated high sensitivity troponin I (hsTnI) values and significant  changes across serial measurements may suggest ACS but many other  chronic and  acute conditions are known to elevate hsTnI results.  Refer to the "Links" section for chest pain algorithms and additional  guidance. Performed at [facility], [address], [place], [place] [address]   Lactic acid, plasma     Status: None   Collection Time: 12/28/22 10:57 AM  Result Value Ref Range   Lactic Acid, Venous 1.8 0.5 - 1.9 mmol/L    Comment: Performed at [facility], [address], [place], [place] [address]  Troponin I (High Sensitivity)  Status: None   Collection Time: 12/28/22 12:11 PM  Result Value Ref Range   Troponin I (High Sensitivity) 8 <18 ng/L    Comment: (NOTE) Elevated high sensitivity troponin I (hsTnI) values and significant  changes across serial measurements may suggest ACS but many other  chronic and acute conditions are known to elevate hsTnI results.  Refer to the "Links" section for chest pain algorithms and additional  guidance. Performed at [facility], [address] [place], [place] [address]    DG Chest Portable 1 View  Result Date: 12/28/2022 CLINICAL DATA:  Provided history: Altered mental status. Confusion. Weakness. EXAM: PORTABLE CHEST 1 VIEW COMPARISON:  Prior chest radiographs 09/27/2022 and earlier. FINDINGS: Heart size at the upper limits of normal. Aortic atherosclerosis. Small left pleural effusion. Ill-defined opacity at the left lung base which may reflect atelectasis and/or airspace consolidation. Small bandlike left suprahilar pulmonary opacity. No appreciable airspace consolidation on the right. No evidence of pneumothorax. Degenerative changes of the spine. IMPRESSION: 1. Small left pleural effusion. 2. Left basilar atelectasis and/or airspace consolidation. 3. Small bandlike left suprahilar pulmonary opacity. This may reflect atelectasis or pneumonia. However, radiographic follow-up to resolution is recommended to exclude underlying malignancy. Electronically Signed   By: Jackey Loge D.O.   On: 12/28/2022 12:38    Pending  Labs Unresulted Labs (From admission, onward)     Start     Ordered   12/29/22 0500  Basic metabolic panel  Tomorrow morning,   R        12/28/22 1349   12/28/22 1019  Culture, blood (routine x 2)  BLOOD CULTURE X 2,   R (with STAT occurrences)      12/28/22 1018            Vitals/Pain Today's Vitals   12/28/22 1130 12/28/22 1215 12/28/22 1300 12/28/22 1327  BP: (!) 76/63 (!) 87/69 (!) 91/56 97/71  Pulse: 70 72 75   Resp: 17 20 (!) 23 (!) 22  Temp: 98.2 F (36.8 C)     TempSrc: Oral     SpO2: 99% 93% 97%   Weight: 195 lb (88.5 kg)     Height: 5\' 10"  (1.778 m)     PainSc: 0-No pain       Isolation Precautions No active isolations  Medications Medications  rosuvastatin (CRESTOR) tablet 10 mg (has no administration in time range)  buPROPion (WELLBUTRIN XL) 24 hr tablet 150 mg (has no administration in time range)  sertraline (ZOLOFT) tablet 50 mg (has no administration in time range)  apixaban (ELIQUIS) tablet 5 mg (has no administration in time range)  albuterol (PROVENTIL) (2.5 MG/3ML) 0.083% nebulizer solution 2.5 mg (has no administration in time range)  fluticasone furoate-vilanterol (BREO ELLIPTA) 100-25 MCG/ACT 1 puff (has no administration in time range)    And  umeclidinium bromide (INCRUSE ELLIPTA) 62.5 MCG/ACT 1 puff (has no administration in time range)  sodium chloride flush (NS) 0.9 % injection 3 mL (has no administration in time range)  sodium chloride flush (NS) 0.9 % injection 3 mL (has no administration in time range)  sodium chloride flush (NS) 0.9 % injection 3 mL (has no administration in time range)  acetaminophen (TYLENOL) tablet 650 mg (has no administration in time range)    Or  acetaminophen (TYLENOL) suppository 650 mg (has no administration in time range)  traZODone (DESYREL) tablet 50 mg (has no administration in time range)  polyethylene glycol (MIRALAX / GLYCOLAX) packet 17 g (has no administration in time  range)  bisacodyl (DULCOLAX)  suppository 10 mg (has no administration in time range)  ondansetron (ZOFRAN) tablet 4 mg (has no administration in time range)    Or  ondansetron (ZOFRAN) injection 4 mg (has no administration in time range)  dextrose 5 %-0.9 % sodium chloride infusion (has no administration in time range)  LORazepam (ATIVAN) tablet 1-4 mg (has no administration in time range)    Or  LORazepam (ATIVAN) injection 1-4 mg (has no administration in time range)  thiamine (VITAMIN B1) tablet 100 mg (has no administration in time range)    Or  thiamine (VITAMIN B1) injection 100 mg (has no administration in time range)  folic acid (FOLVITE) tablet 1 mg (has no administration in time range)  multivitamin with minerals tablet 1 tablet (has no administration in time range)  sodium chloride 0.9 % bolus 1,000 mL (0 mLs Intravenous Stopped 12/28/22 1137)  sodium chloride 0.9 % bolus 1,000 mL (1,000 mLs Intravenous New Bag/Given 12/28/22 1327)    Mobility walks     Focused Assessments Cardiac Assessment Handoff:    No results found for: "CKTOTAL", "CKMB", "CKMBINDEX", "TROPONINI" Lab Results  Component Value Date   DDIMER 0.58 (H) 10/28/2018   Does the Patient currently have chest pain? No    R Recommendations: See Admitting Provider Note  Report given to:   Additional Notes: Pt c/o weakness, but no chest pain or SHoB. No LOC. BP somewhat responsive to NSS. 20ga in RW and USG 20ga LUA.

## 2022-12-28 NOTE — Plan of Care (Signed)
  Problem: Respiratory: Goal: Will regain and/or maintain adequate ventilation Outcome: Not Progressing   Problem: Safety: Goal: Ability to remain free from injury will improve Outcome: Not Progressing

## 2022-12-28 NOTE — ED Provider Notes (Signed)
[place] EMERGENCY DEPARTMENT AT [facility] Provider Note   CSN: [id number] Arrival date & time: 12/28/22  3244     History  Chief Complaint  Patient presents with   Weakness    Anthony Little is a 72 y.o. male.  Presenting emergency department with what sounds like a near syncopal episode.  Wife states that patient has had decreased p.o. intake for some time also with heavy alcohol use.  Notes for the past 2 days increasing weakness.  Last night had some transient disorientation, but no focalized deficits per wife and had a near syncopal type episode while trying to get up from chair.  Stated laying down for all night and this morning.  Again tried to get up out of the bed and needed significant help due to global weakness in his lower extremities and again a near syncopal type episode.  She states that he had some seemingly slurred speech, but again no facial droop, weakness, aphasia.  Reports that he is currently back to his baseline.  Patient states that he feels weak all over.  Denies any fevers, headache, chest pain, shortness of breath, abdominal pain.  Notes that he has no appetite.   Weakness      Home Medications Prior to Admission medications   Medication Sig Start Date End Date Taking? Authorizing Provider  albuterol (PROVENTIL) (2.5 MG/3ML) 0.083% nebulizer solution Take 3 mLs (2.5 mg total) by nebulization every 6 (six) hours as needed for wheezing or shortness of breath. 07/11/22   Coralyn Helling, MD  albuterol (VENTOLIN HFA) 108 (90 Base) MCG/ACT inhaler Inhale 2 puffs into the lungs every 6 (six) hours as needed for wheezing or shortness of breath.    [provider]  buPROPion (WELLBUTRIN XL) 150 MG 24 hr tablet TAKE 1 TABLET BY MOUTH EVERY DAY Patient taking differently: Take 150 mg by mouth daily. 01/26/21   Babs Sciara, MD  doxycycline (VIBRA-TABS) 100 MG tablet Take 1 tablet (100 mg total) by mouth 2 (two) times daily. 09/22/22    Babs Sciara, MD  ELIQUIS 5 MG TABS tablet TAKE 1 TABLET BY MOUTH TWICE A DAY 06/01/22   Jonelle Sidle, MD  fluticasone [facility]) 50 MCG/ACT nasal spray PLACE 1 SPRAY INTO BOTH NOSTRILS AT BEDTIME. 08/01/22   Coralyn Helling, MD  Fluticasone-Umeclidin-Vilant (TRELEGY ELLIPTA) 100-62.5-25 MCG/ACT AEPB Inhale 1 puff into the lungs daily in the afternoon. 07/11/22   Coralyn Helling, MD  furosemide (LASIX) 20 MG tablet TAKE 1 TABLET BY MOUTH EVERY DAY 05/16/22   Babs Sciara, MD  glucosamine-chondroitin 500-400 MG tablet Take 2 tablets by mouth every morning.    [provider]  lisinopril (ZESTRIL) 2.5 MG tablet Take 1 tablet (2.5 mg total) by mouth daily. 11/07/22   Babs Sciara, MD  metoprolol succinate (TOPROL-XL) 25 MG 24 hr tablet TAKE 1 TABLET (25 MG TOTAL) BY MOUTH DAILY. 11/27/22   Babs Sciara, MD  Multiple Vitamin (MULTIVITAMIN WITH MINERALS) TABS tablet Take 1 tablet by mouth daily. 02/23/21   Johnson, Clanford L, MD  rosuvastatin (CRESTOR) 10 MG tablet TAKE 1 TABLET BY MOUTH EVERY DAY 07/13/22   Babs Sciara, MD  sertraline (ZOLOFT) 50 MG tablet TAKE 1 TABLET BY MOUTH EVERY DAY 07/13/22   Babs Sciara, MD      Allergies    Ivp dye [iodinated contrast media]    Review of Systems   Review of Systems  Neurological:  Positive for weakness.  Physical Exam Updated Vital Signs BP (!) 87/69   Pulse 72   Temp 98.2 F (36.8 C) (Oral)   Resp 20   Ht 5\' 10"  (1.778 m)   Wt 88.5 kg   SpO2 93%   BMI 27.98 kg/m  Physical Exam Vitals and nursing note reviewed.  Constitutional:      General: He is not in acute distress.    Appearance: He is not toxic-appearing.  HENT:     Head: Normocephalic.     Nose: Nose normal.     Mouth/Throat:     Mouth: Mucous membranes are dry.  Eyes:     Conjunctiva/sclera: Conjunctivae normal.  Cardiovascular:     Rate and Rhythm: Normal rate. Rhythm irregular.  Pulmonary:     Effort: Pulmonary effort is normal.     Breath sounds:  Normal breath sounds.  Abdominal:     General: Abdomen is flat. There is no distension.     Palpations: Abdomen is soft.     Tenderness: There is no abdominal tenderness. There is no guarding or rebound.  Musculoskeletal:     Right lower leg: No edema.     Left lower leg: No edema.  Skin:    General: Skin is warm.     Capillary Refill: Capillary refill takes less than 2 seconds.  Neurological:     General: No focal deficit present.     Mental Status: He is alert and oriented to person, place, and time.  Psychiatric:        Mood and Affect: Mood normal.        Behavior: Behavior normal.     ED Results / Procedures / Treatments   Labs (all labs ordered are listed, but only abnormal results are displayed) Labs Reviewed  CBC - Abnormal; Notable for the following components:      Result Value   RBC 3.48 (*)    Hemoglobin 12.7 (*)    HCT 38.3 (*)    MCV 110.1 (*)    MCH 36.5 (*)    All other components within normal limits  COMPREHENSIVE METABOLIC PANEL - Abnormal; Notable for the following components:   Sodium 131 (*)    CO2 20 (*)    Creatinine, Ser 2.28 (*)    Calcium 8.4 (*)    Albumin 3.3 (*)    GFR, Estimated 30 (*)    All other components within normal limits  CULTURE, BLOOD (ROUTINE X 2)  CULTURE, BLOOD (ROUTINE X 2)  LIPASE, BLOOD  LACTIC ACID, PLASMA  TROPONIN I (HIGH SENSITIVITY)  TROPONIN I (HIGH SENSITIVITY)    EKG None  Radiology DG Chest Portable 1 View  Result Date: 12/28/2022 CLINICAL DATA:  Provided history: Altered mental status. Confusion. Weakness. EXAM: PORTABLE CHEST 1 VIEW COMPARISON:  Prior chest radiographs 09/27/2022 and earlier. FINDINGS: Heart size at the upper limits of normal. Aortic atherosclerosis. Small left pleural effusion. Ill-defined opacity at the left lung base which may reflect atelectasis and/or airspace consolidation. Small bandlike left suprahilar pulmonary opacity. No appreciable airspace consolidation on the right. No  evidence of pneumothorax. Degenerative changes of the spine. IMPRESSION: 1. Small left pleural effusion. 2. Left basilar atelectasis and/or airspace consolidation. 3. Small bandlike left suprahilar pulmonary opacity. This may reflect atelectasis or pneumonia. However, radiographic follow-up to resolution is recommended to exclude underlying malignancy. Electronically Signed   By: Jackey Loge D.O.   On: 12/28/2022 12:38    Procedures Procedures    Medications Ordered in ED Medications  sodium chloride 0.9 % bolus 1,000 mL (has no administration in time range)  sodium chloride 0.9 % bolus 1,000 mL (0 mLs Intravenous Stopped 12/28/22 1137)    ED Course/ Medical Decision Making/ A&P                                 Medical Decision Making This is a 72 year old male presenting emergency department with generalized weakness and near syncopal episodes in the setting of decreased p.o. intake.  Hypotensive on arrival 58/43, but nontachycardic and no fever.  He does take a beta-blocker and last took dose last night.  He has no localizing neurodeficits on exam and at his neurologic baseline wife at bedside providing corroborating history the patient.  Patient reports his speech was slurred due to his dentures.  Given history and vitals low suspicion for acute stroke; suspect symptoms secondary to his hypotension which I suspect is secondary to hypovolemia/decreased p.o. intake as his workup today significant for an AKI.  With his hypotension there was initial concern for possible infectious etiology.  However no fever, no leukocytosis.  No elevated lactate.  Does have a mild hyponatremia. Baseline creatinine of 1.5 it is 2.28 here.  Received 1 L IV fluids with improvement of blood pressure to the mid 90s systolic.  Second liter ordered.  Given patient's AKI and soft blood pressures we will admit for further management.  Amount and/or Complexity of Data Reviewed Labs: ordered. Radiology:  ordered. ECG/medicine tests: ordered.          Final Clinical Impression(s) / ED Diagnoses Final diagnoses:  AKI (acute kidney injury) [facility])    Rx / DC Orders ED Discharge Orders     None         Coral Spikes, DO 12/28/22 1324

## 2022-12-28 NOTE — ED Triage Notes (Signed)
Pt wife brought pt in due to confusion last night and increased weakness at 9pm. After waking this am pt was so weak he could not stand alone and was more confused with slurred speech. Pt arrived in private vehicle with obvious weakness noted and needed 2 assist to get in bed. Alert/oriented to most. Wife states is a daily whiskey drinker and hasn't had any alcohol in 2 days. Pt brought immediatly back and iv started due to low bp. Edp aawre

## 2022-12-28 NOTE — H&P (Signed)
Patient Demographics:    Anthony Little, is a 72 y.o. male  MRN: [id number]   DOB - 07-Apr-1950  Admit Date - 12/28/2022  Outpatient Primary MD for the patient is Babs Sciara, MD   Assessment & Plan:   Assessment and Plan: A/p 1)Hypotension/Near Syncope----due to dehydration in the setting of poor oral intake No Nausea, Vomiting or Diarrhea --In the ED is found to be unsteady with blood pressure of 58/42 initially --In the ED received IV fluids blood pressure recovered and patient was more alert and conversant -Hold Lisinopril -Hold Metoprolol -Hold Lasix  2)CAP--left-sided pneumonia --Chest x-ray with possible left-sided pneumonia--- patient reports chronic cough and respiratory symptoms due to his underlying COPD/emphysema -Continue bronchodilators and mucolytics  3)COPD with Emphysema. -Antibiotics and bronchodilators and mucolytics as above noted   4)Tobacco abuse. -Smoking cessation advised -Nicotine patch recommended   5)Persistent atrial fibrillation, Thoracic aortic aneurysm. -Continue Eliquis for stroke prophylaxis -Hold Toprol due to soft BP  6)AKI----acute kidney injury on CKD stage -3A -Creatinine is up to 2.28 from a baseline usually between 1.5-1.6 -Hydrate orally and IV -Hold lisinopril -Renally adjust medications, avoid nephrotoxic agents / dehydration  / hypotension  7)Chronic Anemia--Hgb 12.7 which is close to recent baseline -No bleeding concerns at this time -Monitor closely while on Eliquis  8) alcohol abuse--- drinks whiskey daily -High risk for DTs -Lorazepam per CIWA protocol -Multivitamin thiamine folic and thiamine as ordered  9) hyponatremia--due to dehydration and alcohol abuse -IV fluids as ordered  Disposition: The patient is from: Home               Anticipated d/c is to: Home              Anticipated d/c date is: 1 day              Patient currently is not medically stable to d/c. Barriers: Not Clinically Stable-   With History of - Reviewed by me  Past Medical History:  Diagnosis Date   Abdominal aortic aneurysm (AAA) (HCC)    Alcoholic (HCC)    Atrial fibrillation and flutter (HCC)    CKD (chronic kidney disease) stage 3, GFR 30-59 ml/min (HCC)    CVA (cerebral infarction)    Essential hypertension    Hyperlipidemia    Thoracic aortic aneurysm [facility])       Past Surgical History:  Procedure Laterality Date   BIOPSY  08/17/2020   Procedure: BIOPSY;  Surgeon: Lanelle Bal, DO;  [facility];  Service: Endoscopy;;   BIOPSY  02/21/2021   Procedure: BIOPSY;  Surgeon: Lanelle Bal, DO;  [facility];  Service: Endoscopy;;   COLONOSCOPY  12/2009   Diverticulosis, mild internal hemorrhoids, multiple colon polyps removed (adenomatous). One irregular shaped polyp was tattooed. Next colonoscopy due October 2016.   COLONOSCOPY N/A 11/01/2015   Procedure: COLONOSCOPY;  Surgeon: West Bali, MD;  [facility]: [facility];  Service: Endoscopy;  Laterality: N/A;  100   COLONOSCOPY WITH PROPOFOL N/A 08/17/2020   Procedure: COLONOSCOPY WITH PROPOFOL;  Surgeon: Lanelle Bal, DO;  [facility];  Service: Endoscopy;  Laterality: N/A;  2:15pm   ESOPHAGOGASTRODUODENOSCOPY N/A 10/08/2012   SLF: Moderate erosive gastritis/duodenitis. Biopsies benign.   ESOPHAGOGASTRODUODENOSCOPY N/A 11/01/2015   Procedure: ESOPHAGOGASTRODUODENOSCOPY (EGD);  Surgeon: West Bali, MD;  [facility];  Service: Endoscopy;  Laterality: N/A;   ESOPHAGOGASTRODUODENOSCOPY (EGD) WITH PROPOFOL N/A 02/21/2021   Procedure: ESOPHAGOGASTRODUODENOSCOPY (EGD) WITH PROPOFOL;  Surgeon: Lanelle Bal, DO;  [facility];  Service: Endoscopy;  Laterality: N/A;   POLYPECTOMY  08/17/2020   Procedure: POLYPECTOMY;   Surgeon: Lanelle Bal, DO;  [facility];  Service: Endoscopy;;    Chief Complaint  Patient presents with   Weakness     HPI:    Anthony Little  is a 72 y.o. male with past medical history relevant for ongoing alcohol abuse, COPD/emphysema, history of tobacco abuse, HTN, HLD, PAFib, prior  CVA, CKD 3A,  abdominal aortic aneurysm who presents to the ED by private car brought in by his wife due to concerns about increased confusion gait disturbance and speech concerns -As per patient and his wife patient drinks whiskey every day... However he quit drinking a couple days ago -In the ED is found to be unsteady with blood pressure of 58/42 initially -Required assist of 2 to transfer to chair -Denies chest pain, denies pleuritic symptoms or palpitations or increased dyspnea -No leg pains -He just feels weak and dizzy -In the ED received IV fluids blood pressure recovered and patient was more alert and conversant  No Nausea, Vomiting or Diarrhea No fever  Or chills  -Twelve-lead EKG is not visible in epic by EDP EKG showed A-fib without RVR Troponin 10 >> 8 -Lactic acid is 1.8 -Lipase is 48 -WBC 5.9 hemoglobin 12.7 which is baseline, platelets 245 which is higher than recent baseline -Sodium is 131 bicarb 20 creatinine up to 2.28 from a baseline usually around 1.5-1.6, LFTs are not elevated -Chest x-ray with possible left-sided pneumonia--- patient reports chronic cough and respiratory symptoms due to his underlying COPD/emphysema    Review of systems:    In addition to the HPI above,   A full Review of  Systems was done, all other systems reviewed are negative except as noted above in HPI , .   Social History:  Reviewed by me    Social History   Tobacco Use   Smoking status: Every Day    Current packs/day: 0.50    Average packs/day: 0.5 packs/day for 7.2 years (3.6 ttl pk-yrs)    Types: Cigarettes    Start date: 10/04/2015   Smokeless tobacco: Never    Tobacco comments:    he would like to but he knows it is hard   Substance Use Topics   Alcohol use: Yes    Alcohol/week: 4.0 standard drinks of alcohol    Types: 4 Standard drinks or equivalent per week    Comment: over 6oz of whiskey a day    Family History :  Reviewed by me  Family History  Problem Relation Age of Onset   Diabetes Mother    Parkinson's disease Father    Diabetes Maternal Grandfather    Ulcers Neg Hx    Colon cancer Neg Hx      Home Medications:   Prior to Admission medications   Medication Sig Start  Date End Date Taking? Authorizing Provider  albuterol (PROVENTIL) (2.5 MG/3ML) 0.083% nebulizer solution Take 3 mLs (2.5 mg total) by nebulization every 6 (six) hours as needed for wheezing or shortness of breath. 07/11/22  Yes Coralyn Helling, MD  buPROPion (WELLBUTRIN XL) 150 MG 24 hr tablet TAKE 1 TABLET BY MOUTH EVERY DAY Patient taking differently: Take 150 mg by mouth daily. 01/26/21  Yes Babs Sciara, MD  ELIQUIS 5 MG TABS tablet TAKE 1 TABLET BY MOUTH TWICE A DAY 06/01/22  Yes Jonelle Sidle, MD  furosemide (LASIX) 20 MG tablet TAKE 1 TABLET BY MOUTH EVERY DAY 05/16/22  Yes Babs Sciara, MD  glucosamine-chondroitin 500-400 MG tablet Take 2 tablets by mouth every morning.   Yes [provider]  Glycerin-Hypromellose-PEG 400 (DRY EYE RELIEF DROPS OP) Apply 2 drops to eye daily as needed (irritation). Thera tears   Yes [provider]  lisinopril (ZESTRIL) 2.5 MG tablet Take 1 tablet (2.5 mg total) by mouth daily. 11/07/22  Yes Luking, Jonna Coup, MD  Melatonin 10 MG TABS Take 1 tablet by mouth at bedtime.   Yes [provider]  metoprolol succinate (TOPROL-XL) 25 MG 24 hr tablet TAKE 1 TABLET (25 MG TOTAL) BY MOUTH DAILY. 11/27/22  Yes Babs Sciara, MD  Probiotic Product (PROBIOTIC ADVANCED PO) Take 1 tablet by mouth daily.   Yes [provider]  rosuvastatin (CRESTOR) 10 MG tablet TAKE 1 TABLET BY MOUTH EVERY DAY 07/13/22  Yes  Babs Sciara, MD  sertraline (ZOLOFT) 50 MG tablet TAKE 1 TABLET BY MOUTH EVERY DAY 07/13/22  Yes Babs Sciara, MD     Allergies:     Allergies  Allergen Reactions   Ivp Dye [Iodinated Contrast Media] Nausea And Vomiting     Physical Exam:   Vitals  Blood pressure 101/61, pulse 85, temperature 98.2 F (36.8 C), temperature source Oral, resp. rate (!) 24, height 5\' 10"  (1.778 m), weight 88.5 kg, SpO2 98%.  Physical Examination: General appearance - alert,  in no distress  Mental status - alert, oriented to person, place, and time,  Eyes - sclera anicteric Neck - supple, no JVD elevation , Chest - -fair air movement, occasional wheezes Heart - S1 and S2 normal, irregularly irregular,  abdomen - soft, nontender, nondistended, +BS Neurological - screening mental status exam normal, neck supple without rigidity, cranial nerves II through XII intact, DTR's normal and symmetric Extremities - no pedal edema noted, intact peripheral pulses  Skin - warm, dry  Data Review:    CBC Recent Labs  Lab 12/28/22 1018  WBC 5.9  HGB 12.7*  HCT 38.3*  PLT 245  MCV 110.1*  MCH 36.5*  MCHC 33.2  RDW 13.2   ------------------------------------------------------------------------------------------------------------------  Chemistries  Recent Labs  Lab 12/28/22 1018  NA 131*  K 4.2  CL 98  CO2 20*  GLUCOSE 87  BUN 23  CREATININE 2.28*  CALCIUM 8.4*  AST 34  ALT 21  ALKPHOS 90  BILITOT 0.6   ------------------------------------------------------------------------------------------------------------------ estimated creatinine clearance is 33.3 mL/min (A) (by C-G formula based on SCr of 2.28 mg/dL (H)). ------------------------------------------------------------------------------------------------------------------ ------------------------------------------------------------------------------------------------------------------    Component Value Date/Time   BNP 646.0  (H) 02/13/2022 1320   Urinalysis    Component Value Date/Time   COLORURINE STRAW (A) 02/17/2021 0104   APPEARANCEUR Clear 04/06/2021 1454   LABSPEC 1.015 02/17/2021 0104   PHURINE 5.5 02/17/2021 0104   GLUCOSEU Negative 04/06/2021 1454   HGBUR SMALL (A) 02/17/2021 0104  BILIRUBINUR negative 01/03/2022 1330   BILIRUBINUR Negative 04/06/2021 1454   KETONESUR negative 01/03/2022 1330   KETONESUR NEGATIVE 02/17/2021 0104   PROTEINUR Negative 04/06/2021 1454   PROTEINUR 30 (A) 02/17/2021 0104   UROBILINOGEN 0.2 01/03/2022 1330   NITRITE Negative 01/03/2022 1330   NITRITE Negative 04/06/2021 1454   NITRITE NEGATIVE 02/17/2021 0104   LEUKOCYTESUR Negative 01/03/2022 1330   LEUKOCYTESUR Negative 04/06/2021 1454   LEUKOCYTESUR NEGATIVE 02/17/2021 0104    Imaging Results:    DG Chest Portable 1 View  Result Date: 12/28/2022 CLINICAL DATA:  Provided history: Altered mental status. Confusion. Weakness. EXAM: PORTABLE CHEST 1 VIEW COMPARISON:  Prior chest radiographs 09/27/2022 and earlier. FINDINGS: Heart size at the upper limits of normal. Aortic atherosclerosis. Small left pleural effusion. Ill-defined opacity at the left lung base which may reflect atelectasis and/or airspace consolidation. Small bandlike left suprahilar pulmonary opacity. No appreciable airspace consolidation on the right. No evidence of pneumothorax. Degenerative changes of the spine. IMPRESSION: 1. Small left pleural effusion. 2. Left basilar atelectasis and/or airspace consolidation. 3. Small bandlike left suprahilar pulmonary opacity. This may reflect atelectasis or pneumonia. However, radiographic follow-up to resolution is recommended to exclude underlying malignancy. Electronically Signed   By: Jackey Loge D.O.   On: 12/28/2022 12:38    Radiological Exams on Admission: DG Chest Portable 1 View  Result Date: 12/28/2022 CLINICAL DATA:  Provided history: Altered mental status. Confusion. Weakness. EXAM: PORTABLE  CHEST 1 VIEW COMPARISON:  Prior chest radiographs 09/27/2022 and earlier. FINDINGS: Heart size at the upper limits of normal. Aortic atherosclerosis. Small left pleural effusion. Ill-defined opacity at the left lung base which may reflect atelectasis and/or airspace consolidation. Small bandlike left suprahilar pulmonary opacity. No appreciable airspace consolidation on the right. No evidence of pneumothorax. Degenerative changes of the spine. IMPRESSION: 1. Small left pleural effusion. 2. Left basilar atelectasis and/or airspace consolidation. 3. Small bandlike left suprahilar pulmonary opacity. This may reflect atelectasis or pneumonia. However, radiographic follow-up to resolution is recommended to exclude underlying malignancy. Electronically Signed   By: Jackey Loge D.O.   On: 12/28/2022 12:38    DVT Prophylaxis -SCD/heparin  AM Labs Ordered, also please review Full Orders  Family Communication: Admission, patients condition and plan of care including tests being ordered have been discussed with the patient who indicate understanding and agree with the plan   Condition  -stable  Shon Hale M.D on 12/28/2022 at 6:20 PM Go to [email or web address] -  for contact info  [facility] - Office  [phone]

## 2022-12-28 NOTE — ED Notes (Signed)
This NT pulled pt into triage after registration stated his condition was worsening in the waiting room. Vitals were completed in triage and pt was immediately pulled back into a hallway bed for further treatment. Obtained a manual BP which was 58/42. Pts wife stated he has been weak since yesterday and today when he was trying to stand up he almost fell before she caught him. He was able to walk yesterday on his own. RN Neysa Bonito obtained Iv access and blood work. RN will complete triage.

## 2022-12-29 DIAGNOSIS — I959 Hypotension, unspecified: Secondary | ICD-10-CM | POA: Diagnosis not present

## 2022-12-29 DIAGNOSIS — F17200 Nicotine dependence, unspecified, uncomplicated: Secondary | ICD-10-CM | POA: Diagnosis not present

## 2022-12-29 LAB — BASIC METABOLIC PANEL
Anion gap: 9 (ref 5–15)
BUN: 22 mg/dL (ref 8–23)
CO2: 22 mmol/L (ref 22–32)
Calcium: 8.1 mg/dL — ABNORMAL LOW (ref 8.9–10.3)
Chloride: 103 mmol/L (ref 98–111)
Creatinine, Ser: 1.62 mg/dL — ABNORMAL HIGH (ref 0.61–1.24)
GFR, Estimated: 45 mL/min — ABNORMAL LOW (ref >60)
Glucose, Bld: 87 mg/dL (ref 70–99)
Potassium: 4.2 mmol/L (ref 3.5–5.1)
Sodium: 134 mmol/L — ABNORMAL LOW (ref 135–145)

## 2022-12-29 MED ORDER — ADULT MULTIVITAMIN W/MINERALS CH: 1.0000 | ORAL_TABLET | Freq: Every day | ORAL | 2 refills | Status: DC

## 2022-12-29 MED ORDER — CEPHALEXIN 500 MG PO CAPS: 500.0000 mg | ORAL_CAPSULE | Freq: Three times a day (TID) | ORAL | 0 refills | Status: AC

## 2022-12-29 MED ORDER — TRELEGY ELLIPTA 100-62.5-25 MCG/ACT IN AEPB: 1.0000 | INHALATION_SPRAY | Freq: Every day | RESPIRATORY_TRACT | 5 refills | Status: DC

## 2022-12-29 MED ORDER — VITAMIN B-1 100 MG PO TABS: 100.0000 mg | ORAL_TABLET | Freq: Every day | ORAL | 3 refills | Status: DC

## 2022-12-29 MED ORDER — DOXYCYCLINE HYCLATE 100 MG PO TABS
100.0000 mg | ORAL_TABLET | Freq: Two times a day (BID) | ORAL | 0 refills | Status: AC
Start: 2022-12-29 — End: 2023-01-03

## 2022-12-29 NOTE — Care Management Obs Status (Signed)
MEDICARE OBSERVATION STATUS NOTIFICATION   Patient Details  Name: Anthony Little MRN: [id number] Date of Birth: [birth year]   Medicare Observation Status Notification Given:  Yes    Corey Harold 12/29/2022, 2:18 PM

## 2022-12-29 NOTE — Plan of Care (Signed)
[facility] Tonsillectomy/Adenoidectomy, Postoperative PEDS care plan entered in error.

## 2022-12-29 NOTE — Discharge Summary (Signed)
Anthony Little, is a 72 y.o. male  DOB 11-12-1950  MRN [id number].  Admission date:  12/28/2022  Admitting Physician  Shon Hale, MD  Discharge Date:  12/29/2022   Primary MD  Babs Sciara, MD  Recommendations for primary care physician for things to follow:   1)Stop Lisinopril and Stop Lasix/Furosemide 2)Repeat BMP and CBC blood test with the primary care provider (Dr. Lilyan Punt) within the week 3) complete abstinence from alcohol advised--- consider outpatient or inpatient alcohol rehab program 4)Avoid ibuprofen/Advil/Aleve/Motrin/Goody Powders/Naproxen/BC powders/Meloxicam/Diclofenac/Indomethacin and other Nonsteroidal anti-inflammatory medications as these will make you more likely to bleed and can cause stomach ulcers, can also cause Kidney problems.  5) eat and drink properly to avoid dehydration  Admission Diagnosis  AKI (acute kidney injury) (HCC) [N17.9] Symptomatic hypotension [I95.9]   Discharge Diagnosis  AKI (acute kidney injury) (HCC) [N17.9] Symptomatic hypotension [I95.9]    Principal Problem:   Symptomatic hypotension Active Problems:   Alcohol abuse   Essential hypertension   Hyperlipidemia   Coronary artery disease involving native coronary artery of native heart with angina pectoris (HCC)   Atrial fibrillation and flutter (HCC)   CKD (chronic kidney disease) stage 3, GFR 30-59 ml/min (HCC)   Other emphysema (HCC)   Anemia due to alcoholism [facility])      Past Medical History:  Diagnosis Date   Abdominal aortic aneurysm (AAA) (HCC)    Alcoholic (HCC)    Atrial fibrillation and flutter (HCC)    CKD (chronic kidney disease) stage 3, GFR 30-59 ml/min (HCC)    CVA (cerebral infarction)    Essential hypertension    Hyperlipidemia    Thoracic aortic aneurysm [facility])     Past Surgical History:  Procedure Laterality Date   BIOPSY  08/17/2020   Procedure: BIOPSY;   Surgeon: Lanelle Bal, DO;  [facility];  Service: Endoscopy;;   BIOPSY  02/21/2021   Procedure: BIOPSY;  Surgeon: Lanelle Bal, DO;  [facility];  Service: Endoscopy;;   COLONOSCOPY  12/2009   Diverticulosis, mild internal hemorrhoids, multiple colon polyps removed (adenomatous). One irregular shaped polyp was tattooed. Next colonoscopy due October 2016.   COLONOSCOPY N/A 11/01/2015   Procedure: COLONOSCOPY;  Surgeon: West Bali, MD;  [facility];  Service: Endoscopy;  Laterality: N/A;  100   COLONOSCOPY WITH PROPOFOL N/A 08/17/2020   Procedure: COLONOSCOPY WITH PROPOFOL;  Surgeon: Lanelle Bal, DO;  [facility];  Service: Endoscopy;  Laterality: N/A;  2:15pm   ESOPHAGOGASTRODUODENOSCOPY N/A 10/08/2012   SLF: Moderate erosive gastritis/duodenitis. Biopsies benign.   ESOPHAGOGASTRODUODENOSCOPY N/A 11/01/2015   Procedure: ESOPHAGOGASTRODUODENOSCOPY (EGD);  Surgeon: West Bali, MD;  [facility];  Service: Endoscopy;  Laterality: N/A;   ESOPHAGOGASTRODUODENOSCOPY (EGD) WITH PROPOFOL N/A 02/21/2021   Procedure: ESOPHAGOGASTRODUODENOSCOPY (EGD) WITH PROPOFOL;  Surgeon: Lanelle Bal, DO;  [facility];  Service: Endoscopy;  Laterality: N/A;   POLYPECTOMY  08/17/2020   Procedure: POLYPECTOMY;  Surgeon: Lanelle Bal, DO;  [facility];  Service: Endoscopy;;    HPI  from the history and physical done on the day of admission:    Anthony Little  is a 71 y.o. male with past medical history relevant for ongoing alcohol abuse, COPD/emphysema, history of tobacco abuse, HTN, HLD, PAFib, prior  CVA, CKD 3A,  abdominal aortic aneurysm who presents to the ED by private car brought in by his wife due to concerns about increased confusion gait disturbance and speech concerns -As per patient and his wife patient drinks whiskey every day... However he quit drinking a couple days ago -In the ED is found to be  unsteady with blood pressure of 58/42 initially -Required assist of 2 to transfer to chair -Denies chest pain, denies pleuritic symptoms or palpitations or increased dyspnea -No leg pains -He just feels weak and dizzy -In the ED received IV fluids blood pressure recovered and patient was more alert and conversant   No Nausea, Vomiting or Diarrhea No fever  Or chills  -Twelve-lead EKG is not visible in epic by EDP EKG showed A-fib without RVR Troponin 10 >> 8 -Lactic acid is 1.8 -Lipase is 48 -WBC 5.9 hemoglobin 12.7 which is baseline, platelets 245 which is higher than recent baseline -Sodium is 131 bicarb 20 creatinine up to 2.28 from a baseline usually around 1.5-1.6, LFTs are not elevated -Chest x-ray with possible left-sided pneumonia--- patient reports chronic cough and respiratory symptoms due to his underlying COPD/emphysema     Hospital Course:   A/p 1)Hypotension/Near Syncope----due to dehydration in the setting of poor oral intake No Nausea, Vomiting or Diarrhea --In the ED is found to be unsteady with blood pressure of 58/42 initially --In the ED received IV fluids blood pressure recovered and patient was more alert and conversant -Blood pressure is a lot more stable after hydration with IV and oral fluids -Orthostatic vitals noted with stable blood pressure overall -okay to discontinue lisinopril and Lasix -May restart metoprolol -Follow-up with PCP for recheck  2)CAP--Left-sided Pneumonia --Chest x-ray with possible left-sided pneumonia--- patient reports chronic cough and respiratory symptoms due to his underlying COPD/emphysema -Treated with Rocephin and doxycycline -Respiratory status improved significantly -Okay to discharge on Keflex and doxycycline along with bronchodilators and mucolytics   3)COPD with Emphysema. -Antibiotics and bronchodilators and mucolytics as above noted   4)Tobacco abuse. Smoking cessation counseling for 4 minutes today, consider  nicotine patch I have discussed tobacco cessation with the patient.  I have counseled the patient regarding the negative impacts of continued tobacco use including but not limited to lung cancer, COPD, and cardiovascular disease.  I have discussed alternatives to tobacco and modalities that may help facilitate tobacco cessation including but not limited to biofeedback, hypnosis, and medications.   -Total time spent with tobacco counseling was 4 minutes. -Smoking cessation advised -OTC Nicotine patch recommended   5)Persistent atrial fibrillation,  -Continue Eliquis for stroke prophylaxis -Restart metoprolol for rate control   6)AKI----acute kidney injury on CKD stage -3A -Creatinine is up to 2.28 from a baseline usually between 1.5-1.6 -Creatinine is down to 1.62 -Stopped Lisinopril and Lasix   7)Chronic megaloblastic anemia anemia--Hgb 12.7 which is close to recent baseline -No bleeding concerns at this time -Monitor closely while on Eliquis -Multivitamin including folic acid recommended I see #8 below   8)Alcohol abuse--- drinks whiskey daily -High risk for DTs Outpatient or inpatient alcohol rehab advised -Currently no significant DT symptoms at this time -c/n Multivitamin thiamine folic and thiamine as ordered  9)Hyponatremia--due to dehydration and alcohol abuse Na improved to 134 from 131 Stopped Lasix  10)Thoracic aortic aneurysm----outpatient surveillance as previously advised   Disposition: The patient is from: Home              Anticipated d/c is to: Home  Discharge Condition: -stable  Follow UP   Follow-up Information     Babs Sciara, MD. Schedule an appointment as soon as possible for a visit in 1 week(s).   Specialty: Family Medicine Why: Repeat CBC and BMP Contact information: [address] [place] [address] [phone]                 Diet and Activity recommendation:  As advised  Discharge Instructions    Discharge  Instructions     Call MD for:  difficulty breathing, headache or visual disturbances   Complete by: As directed    Call MD for:  persistant dizziness or light-headedness   Complete by: As directed    Call MD for:  persistant nausea and vomiting   Complete by: As directed    Call MD for:  temperature >100.4   Complete by: As directed    Diet - low sodium heart healthy   Complete by: As directed    Discharge instructions   Complete by: As directed    1)Stop Lisinopril and Stop Lasix/Furosemide 2)Repeat BMP and CBC blood test with the primary care provider (Dr. Lilyan Punt) within the week 3) complete abstinence from alcohol advised--- consider outpatient or inpatient alcohol rehab program 4)Avoid ibuprofen/Advil/Aleve/Motrin/Goody Powders/Naproxen/BC powders/Meloxicam/Diclofenac/Indomethacin and other Nonsteroidal anti-inflammatory medications as these will make you more likely to bleed and can cause stomach ulcers, can also cause Kidney problems.  5) eat and drink properly to avoid dehydration   Increase activity slowly   Complete by: As directed         Discharge Medications     Allergies as of 12/29/2022       Reactions   Ivp Dye [iodinated Contrast Media] Nausea And Vomiting        Medication List     STOP taking these medications    furosemide 20 MG tablet Commonly known as: LASIX   lisinopril 2.5 MG tablet Commonly known as: Zestril       TAKE these medications    albuterol (2.5 MG/3ML) 0.083% nebulizer solution Commonly known as: PROVENTIL Take 3 mLs (2.5 mg total) by nebulization every 6 (six) hours as needed for wheezing or shortness of breath.   buPROPion 150 MG 24 hr tablet Commonly known as: WELLBUTRIN XL TAKE 1 TABLET BY MOUTH EVERY DAY   cephALEXin 500 MG capsule Commonly known as: Keflex Take 1 capsule (500 mg total) by mouth 3 (three) times daily for 5 days.   doxycycline 100 MG tablet Commonly known as: VIBRA-TABS Take 1 tablet (100 mg  total) by mouth 2 (two) times daily for 5 days.   DRY EYE RELIEF DROPS OP Apply 2 drops to eye daily as needed (irritation). Thera tears   Eliquis 5 MG Tabs tablet Generic drug: apixaban TAKE 1 TABLET BY MOUTH TWICE A DAY   glucosamine-chondroitin 500-400 MG tablet Take 2 tablets by mouth every morning.   Melatonin 10 MG Tabs Take 1 tablet by mouth at bedtime.   metoprolol succinate 25 MG 24 hr tablet Commonly known as: TOPROL-XL TAKE 1 TABLET (25 MG TOTAL) BY MOUTH DAILY.   multivitamin with minerals Tabs tablet Take 1 tablet by mouth daily. Start taking on:  December 30, 2022   PROBIOTIC ADVANCED PO Take 1 tablet by mouth daily.   rosuvastatin 10 MG tablet Commonly known as: CRESTOR TAKE 1 TABLET BY MOUTH EVERY DAY   sertraline 50 MG tablet Commonly known as: ZOLOFT TAKE 1 TABLET BY MOUTH EVERY DAY   thiamine 100 MG tablet Commonly known as: Vitamin B-1 Take 1 tablet (100 mg total) by mouth daily. Start taking on: December 30, 2022   Trelegy Ellipta 100-62.5-25 MCG/ACT Aepb Generic drug: Fluticasone-Umeclidin-Vilant Inhale 1 puff into the lungs daily in the afternoon.       Major procedures and Radiology Reports - PLEASE review detailed and final reports for all details, in brief -   DG Chest Portable 1 View  Result Date: 12/28/2022 CLINICAL DATA:  Provided history: Altered mental status. Confusion. Weakness. EXAM: PORTABLE CHEST 1 VIEW COMPARISON:  Prior chest radiographs 09/27/2022 and earlier. FINDINGS: Heart size at the upper limits of normal. Aortic atherosclerosis. Small left pleural effusion. Ill-defined opacity at the left lung base which may reflect atelectasis and/or airspace consolidation. Small bandlike left suprahilar pulmonary opacity. No appreciable airspace consolidation on the right. No evidence of pneumothorax. Degenerative changes of the spine. IMPRESSION: 1. Small left pleural effusion. 2. Left basilar atelectasis and/or airspace consolidation. 3.  Small bandlike left suprahilar pulmonary opacity. This may reflect atelectasis or pneumonia. However, radiographic follow-up to resolution is recommended to exclude underlying malignancy. Electronically Signed   By: Jackey Loge D.O.   On: 12/28/2022 12:38    Micro Results  Recent Results (from the past 240 hour(s))  Culture, blood (routine x 2)     Status: None (Preliminary result)   Collection Time: 12/28/22 10:24 AM   Specimen: BLOOD  Result Value Ref Range Status   Specimen Description BLOOD LEFT ARM  Final   Special Requests   Final    BOTTLES DRAWN AEROBIC AND ANAEROBIC Blood Culture results may not be optimal due to an excessive volume of blood received in culture bottles   Culture   Final    NO GROWTH < 24 HOURS Performed at [facility], [address] [place], [place] [address]    Report Status PENDING  Incomplete  Culture, blood (routine x 2)     Status: None (Preliminary result)   Collection Time: 12/28/22 10:57 AM   Specimen: BLOOD  Result Value Ref Range Status   Specimen Description BLOOD BLOOD LEFT ARM lac  Final   Special Requests   Final    BOTTLES DRAWN AEROBIC AND ANAEROBIC Blood Culture results may not be optimal due to an excessive volume of blood received in culture bottles   Culture   Final    NO GROWTH < 24 HOURS Performed at [facility], [address] [place], [place] [address]    Report Status PENDING  Incomplete    Today   Subjective    Pavel Murty today has no new complaints -Cough and respiratory symptoms have improved significantly No fever  Or chills    Patient has been seen and examined prior to discharge   Objective   Blood pressure 124/79, pulse 90, temperature 97.8 F (36.6 C), temperature source Oral, resp. rate 18, height 5\' 10"  (1.778 m), weight 87.5 kg, SpO2 99%.   Intake/Output Summary (Last 24 hours) at 12/29/2022 1427 Last data filed at 12/29/2022 1101 Gross per 24 hour  Intake 1779.59 ml  Output 815 ml  Net 964.59  ml    Exam Gen:- Awake Alert, no acute distress, speaking in complete  sentences HEENT:- [place].AT, No sclera icterus Neck-Supple Neck,No JVD,.  Lungs-improved air movement, no wheezing  CV- S1, S2 normal, irregular Abd-  +ve B.Sounds, Abd Soft, No tenderness,    Extremity/Skin:- No  edema,   good pulses Psych-affect is appropriate, oriented x3 Neuro-no new focal deficits, no tremors    Data Review   CBC w Diff:  Lab Results  Component Value Date   WBC 5.9 12/28/2022   HGB 12.7 (L) 12/28/2022   HGB 12.8 (L) 09/27/2022   HCT 38.3 (L) 12/28/2022   HCT 37.1 (L) 09/27/2022   PLT 245 12/28/2022   PLT 140 (L) 09/27/2022   LYMPHOPCT 17 02/13/2022   BANDSPCT 6 02/16/2021   MONOPCT 5 02/13/2022   EOSPCT 1 02/13/2022   BASOPCT 1 02/13/2022    CMP:  Lab Results  Component Value Date   NA 134 (L) 12/29/2022   NA 139 09/27/2022   K 4.2 12/29/2022   CL 103 12/29/2022   CO2 22 12/29/2022   BUN 22 12/29/2022   BUN 16 09/27/2022   CREATININE 1.62 (H) 12/29/2022   CREATININE 1.42 (H) 08/07/2013   PROT 7.1 12/28/2022   PROT 7.1 07/17/2022   ALBUMIN 3.3 (L) 12/28/2022   ALBUMIN 4.1 07/17/2022   BILITOT 0.6 12/28/2022   BILITOT 0.7 07/17/2022   ALKPHOS 90 12/28/2022   AST 34 12/28/2022   ALT 21 12/28/2022  .  Total Discharge time is about 33 minutes  Shon Hale M.D on 12/29/2022 at 2:27 PM  Go to [email or web address] -  for contact info  [facility] - Office  [phone]

## 2022-12-29 NOTE — Discharge Instructions (Signed)
1)Stop Lisinopril and Stop Lasix/Furosemide 2)Repeat BMP and CBC blood test with the primary care provider (Dr. Lilyan Punt) within the week 3) complete abstinence from alcohol advised--- consider outpatient or inpatient alcohol rehab program 4)Avoid ibuprofen/Advil/Aleve/Motrin/Goody Powders/Naproxen/BC powders/Meloxicam/Diclofenac/Indomethacin and other Nonsteroidal anti-inflammatory medications as these will make you more likely to bleed and can cause stomach ulcers, can also cause Kidney problems.  5) eat and drink properly to avoid dehydration

## 2022-12-29 NOTE — Progress Notes (Signed)
Transition of Care [facility]) - Inpatient Brief Assessment   Patient Details  Name: Anthony Little MRN: [id number] Date of Birth: March 26, 1950  Transition of Care [facility]) CM/SW Contact:    Issaih Kaus A Ayen Viviano, RN Phone Number: 12/29/2022, 2:41 PM   Clinical Narrative: [facility] consulted for [facility] resources. Spoke with patient. States he is not interested at this time. TOC explained if he changes his mind to please let treatment team know.  Transition of Care Asessment: Insurance and Status: Insurance coverage has been reviewed Patient has primary care physician: Yes Home environment has been reviewed: From home Prior level of function:: Independent Prior/Current Home Services: No current home services Social Determinants of Health Reivew: SDOH reviewed no interventions necessary Readmission risk has been reviewed: Yes Transition of care needs: no transition of care needs at this time

## 2023-01-02 LAB — CULTURE, BLOOD (ROUTINE X 2)
Culture: NO GROWTH
Culture: NO GROWTH

## 2023-01-09 ENCOUNTER — Ambulatory Visit (INDEPENDENT_AMBULATORY_CARE_PROVIDER_SITE_OTHER): Payer: Medicare Other | Admitting: Family Medicine

## 2023-01-09 VITALS — BP 125/88 | HR 92 | Temp 98.1°F | Ht 70.0 in | Wt 194.2 lb

## 2023-01-09 DIAGNOSIS — D539 Nutritional anemia, unspecified: Secondary | ICD-10-CM

## 2023-01-09 DIAGNOSIS — N1832 Chronic kidney disease, stage 3b: Secondary | ICD-10-CM | POA: Diagnosis not present

## 2023-01-09 DIAGNOSIS — Z23 Encounter for immunization: Secondary | ICD-10-CM | POA: Diagnosis not present

## 2023-01-09 DIAGNOSIS — J189 Pneumonia, unspecified organism: Secondary | ICD-10-CM | POA: Diagnosis not present

## 2023-01-09 NOTE — Progress Notes (Signed)
Subjective:    Patient ID: Anthony Little, male    DOB: 1951-03-11, 72 y.o.   MRN: [id number]  Discussed the use of AI scribe software for clinical note transcription with the patient, who gave verbal consent to proceed.  History of Present Illness   The patient, with a history of hypertension, kidney disease, and pneumonia, presented with a recent episode of altered mental status leading to an emergency room visit. He reported a period of staring and unresponsiveness, noticed by his partner, with no recollection of the event. He denied any preceding illness or feeling unwell. He has been monitoring his blood pressure, which typically runs high, and reported it to be within his usual range.  During the hospital visit, he was found to have elevated kidney function and dehydration, requiring fluid administration. An X-ray revealed an infection, for which he was prescribed doxycycline, which he is currently taking. Since discharge, his blood pressure has returned to his baseline.  He was advised to discontinue lisinopril and a diuretic, furosemide, during his hospital stay. He reported good respiratory function with reduced coughing compared to before the hospital visit. When he does cough, it is productive with clear sputum.  His nutrition has been inconsistent due to family commitments and a recent emergency involving his great-grandchildren. He reported good mobility and has been engaging in house painting. He continues to smoke, albeit reduced to three to four cigarettes a day, and has significantly cut back on alcohol consumption.  He reported some issues with bowel movements, with periods of straining and urgency. He has been taking a probiotic, which seemed to help initially. He has been incorporating fiber into his diet through bars and has considered trying fiber gummies.  The patient also mentioned a recent COVID-19 vaccination, after which he felt unwell. He has been receiving an  annual flu shot and expressed willingness to continue despite his recent vaccination experience.         Review of Systems     Objective:    Physical Exam   VITALS: BP- 110/68, postural BP- 106/70. CARDIOVASCULAR: Heart sounds normal. lings-CTA EXTREMITIES: No abnormalities noted on inspection of ankles.           Assessment & Plan:  Assessment and Plan    Recent Hospitalization Recent hospitalization for dehydration and infection. Kidney function was elevated and patient was dehydrated. Patient was given fluids and antibiotics (Doxycycline) which are still being taken. -Continue Doxycycline as prescribed. -Order follow-up blood work to recheck kidney function.  Hypertension Blood pressure has been slightly elevated but has returned to normal since hospitalization. Lisinopril and Furosemide were discontinued during hospitalization. -Continue monitoring blood pressure twice daily. -Consider potential reintroduction of Lisinopril and Furosemide in the future.  Respiratory Productive cough prior to hospitalization, less frequent and less productive since. Chest X-ray during hospitalization showed infection. -Continue current respiratory medications. -Order follow-up chest X-ray in three weeks to ensure resolution of infection.  Constipation Some difficulty with bowel movements, using probiotics for relief. -Consider adding fiber gummies to diet for additional fiber intake.  Tobacco Use Currently smoking 3-4 cigarettes per day. -Encourage continued efforts to quit smoking.  Alcohol Use Reduced alcohol consumption recently. -Encourage continued reduction or cessation of alcohol use.  General Health Maintenance -Administer influenza vaccine today. -Schedule follow-up appointment in 3-4 months, or sooner if health issues arise.      1. Stage 3b chronic kidney disease (HCC) Check lab work - Basic Metabolic Panel - CBC with Differential  2. Macrocytic  anemia Check  lab work - CBC with Differential  3. Community acquired pneumonia, unspecified laterality Follow-up x-ray in 3 to 4 weeks - DG Chest 2 View; Future  4. Immunization due Today I will - Flu Vaccine Trivalent High Dose (Fluad)  Patient strongly encouraged to quit smoking Also encouraged to drastically cut back on alcohol use and if possible stop it altogether Follow-up again within 4 months time follow-up sooner if any problems if progressive fevers coughing or shortness of breath will need to be rechecked ASAP

## 2023-01-11 ENCOUNTER — Inpatient Hospital Stay: Payer: Medicare Other | Admitting: Nurse Practitioner

## 2023-01-12 ENCOUNTER — Telehealth: Payer: Self-pay | Admitting: Family Medicine

## 2023-01-12 DIAGNOSIS — J441 Chronic obstructive pulmonary disease with (acute) exacerbation: Secondary | ICD-10-CM | POA: Diagnosis not present

## 2023-01-12 NOTE — Telephone Encounter (Signed)
Returned call to patient , given number has a full voicemail. Other number is not valid

## 2023-01-12 NOTE — Telephone Encounter (Signed)
Patient having issues with nausea for few days no fever and BP is fine. [facility]

## 2023-01-13 ENCOUNTER — Other Ambulatory Visit: Payer: Self-pay | Admitting: Family Medicine

## 2023-01-19 NOTE — Telephone Encounter (Signed)
Patient states his nausea is better now- he had nausea for a few days but then it went away and not having any further issues now

## 2023-02-08 ENCOUNTER — Other Ambulatory Visit: Payer: Self-pay | Admitting: Family Medicine

## 2023-02-12 ENCOUNTER — Encounter: Payer: Self-pay | Admitting: Family Medicine

## 2023-02-12 ENCOUNTER — Other Ambulatory Visit: Payer: Self-pay

## 2023-02-12 MED ORDER — FUROSEMIDE 20 MG PO TABS: 20.0000 mg | ORAL_TABLET | Freq: Every day | ORAL | 3 refills | Status: DC

## 2023-02-12 MED ORDER — POTASSIUM CHLORIDE CRYS ER 20 MEQ PO TBCR: 20.0000 meq | EXTENDED_RELEASE_TABLET | Freq: Every day | ORAL | 0 refills | Status: DC

## 2023-02-12 NOTE — Telephone Encounter (Signed)
Nurses I am fine with restarting furosemide 20 mg 1 every morning, #30 He will also have to take potassium 20 mill equivalent 1 each morning, #30  I also recommend an office visit with myself or Toni Amend Grooms PA with our office-please move forward with this  Patient also to bring all medications with him to office visit May need to have lab work as well at that time

## 2023-03-09 ENCOUNTER — Other Ambulatory Visit: Payer: Self-pay | Admitting: Family Medicine

## 2023-03-11 ENCOUNTER — Other Ambulatory Visit: Payer: Self-pay | Admitting: Cardiology

## 2023-03-12 NOTE — Telephone Encounter (Signed)
Pt last saw Dr Diona Browner 04/19/22, last labs 12/29/22 Creat 1.62, age 72, weight 88.1kg, based on specified criteria pt is on appropriate dosage of Eliquis 5mg  BID for afib.  Will refill rx.

## 2023-03-14 DIAGNOSIS — J441 Chronic obstructive pulmonary disease with (acute) exacerbation: Secondary | ICD-10-CM | POA: Diagnosis not present

## 2023-04-14 DIAGNOSIS — J441 Chronic obstructive pulmonary disease with (acute) exacerbation: Secondary | ICD-10-CM | POA: Diagnosis not present

## 2023-05-07 ENCOUNTER — Encounter (HOSPITAL_COMMUNITY): Payer: Self-pay | Admitting: Emergency Medicine

## 2023-05-07 ENCOUNTER — Other Ambulatory Visit: Payer: Self-pay

## 2023-05-07 ENCOUNTER — Ambulatory Visit: Payer: Self-pay | Admitting: Family Medicine

## 2023-05-07 ENCOUNTER — Inpatient Hospital Stay (HOSPITAL_COMMUNITY)
Admission: EM | Admit: 2023-05-07 | Discharge: 2023-06-12 | DRG: 871 | Disposition: E | Payer: Medicare Other | Attending: Internal Medicine | Admitting: Internal Medicine

## 2023-05-07 ENCOUNTER — Emergency Department (HOSPITAL_COMMUNITY): Payer: Medicare Other

## 2023-05-07 DIAGNOSIS — Z8673 Personal history of transient ischemic attack (TIA), and cerebral infarction without residual deficits: Secondary | ICD-10-CM

## 2023-05-07 DIAGNOSIS — R918 Other nonspecific abnormal finding of lung field: Secondary | ICD-10-CM | POA: Diagnosis not present

## 2023-05-07 DIAGNOSIS — I480 Paroxysmal atrial fibrillation: Secondary | ICD-10-CM | POA: Diagnosis not present

## 2023-05-07 DIAGNOSIS — Z1152 Encounter for screening for COVID-19: Secondary | ICD-10-CM

## 2023-05-07 DIAGNOSIS — E222 Syndrome of inappropriate secretion of antidiuretic hormone: Secondary | ICD-10-CM | POA: Diagnosis not present

## 2023-05-07 DIAGNOSIS — Z66 Do not resuscitate: Secondary | ICD-10-CM | POA: Diagnosis not present

## 2023-05-07 DIAGNOSIS — J869 Pyothorax without fistula: Secondary | ICD-10-CM | POA: Diagnosis present

## 2023-05-07 DIAGNOSIS — G8929 Other chronic pain: Secondary | ICD-10-CM | POA: Diagnosis present

## 2023-05-07 DIAGNOSIS — F17210 Nicotine dependence, cigarettes, uncomplicated: Secondary | ICD-10-CM | POA: Diagnosis not present

## 2023-05-07 DIAGNOSIS — J942 Hemothorax: Secondary | ICD-10-CM | POA: Diagnosis not present

## 2023-05-07 DIAGNOSIS — J440 Chronic obstructive pulmonary disease with acute lower respiratory infection: Secondary | ICD-10-CM | POA: Diagnosis present

## 2023-05-07 DIAGNOSIS — I4891 Unspecified atrial fibrillation: Secondary | ICD-10-CM | POA: Diagnosis not present

## 2023-05-07 DIAGNOSIS — Z8679 Personal history of other diseases of the circulatory system: Secondary | ICD-10-CM

## 2023-05-07 DIAGNOSIS — T17990A Other foreign object in respiratory tract, part unspecified in causing asphyxiation, initial encounter: Secondary | ICD-10-CM | POA: Diagnosis not present

## 2023-05-07 DIAGNOSIS — F32A Depression, unspecified: Secondary | ICD-10-CM | POA: Diagnosis present

## 2023-05-07 DIAGNOSIS — J9601 Acute respiratory failure with hypoxia: Secondary | ICD-10-CM

## 2023-05-07 DIAGNOSIS — R718 Other abnormality of red blood cells: Secondary | ICD-10-CM

## 2023-05-07 DIAGNOSIS — M25562 Pain in left knee: Secondary | ICD-10-CM | POA: Diagnosis present

## 2023-05-07 DIAGNOSIS — Z7901 Long term (current) use of anticoagulants: Secondary | ICD-10-CM

## 2023-05-07 DIAGNOSIS — A403 Sepsis due to Streptococcus pneumoniae: Principal | ICD-10-CM | POA: Diagnosis present

## 2023-05-07 DIAGNOSIS — R7989 Other specified abnormal findings of blood chemistry: Secondary | ICD-10-CM | POA: Diagnosis present

## 2023-05-07 DIAGNOSIS — E8721 Acute metabolic acidosis: Secondary | ICD-10-CM | POA: Diagnosis not present

## 2023-05-07 DIAGNOSIS — Z7984 Long term (current) use of oral hypoglycemic drugs: Secondary | ICD-10-CM

## 2023-05-07 DIAGNOSIS — R6520 Severe sepsis without septic shock: Secondary | ICD-10-CM | POA: Diagnosis not present

## 2023-05-07 DIAGNOSIS — E538 Deficiency of other specified B group vitamins: Secondary | ICD-10-CM | POA: Diagnosis present

## 2023-05-07 DIAGNOSIS — F05 Delirium due to known physiological condition: Secondary | ICD-10-CM | POA: Diagnosis not present

## 2023-05-07 DIAGNOSIS — Y92009 Unspecified place in unspecified non-institutional (private) residence as the place of occurrence of the external cause: Secondary | ICD-10-CM | POA: Diagnosis not present

## 2023-05-07 DIAGNOSIS — I129 Hypertensive chronic kidney disease with stage 1 through stage 4 chronic kidney disease, or unspecified chronic kidney disease: Secondary | ICD-10-CM | POA: Diagnosis not present

## 2023-05-07 DIAGNOSIS — Z4682 Encounter for fitting and adjustment of non-vascular catheter: Secondary | ICD-10-CM | POA: Diagnosis not present

## 2023-05-07 DIAGNOSIS — D649 Anemia, unspecified: Secondary | ICD-10-CM | POA: Diagnosis not present

## 2023-05-07 DIAGNOSIS — J9811 Atelectasis: Secondary | ICD-10-CM | POA: Diagnosis not present

## 2023-05-07 DIAGNOSIS — D539 Nutritional anemia, unspecified: Secondary | ICD-10-CM | POA: Diagnosis present

## 2023-05-07 DIAGNOSIS — D696 Thrombocytopenia, unspecified: Secondary | ICD-10-CM | POA: Diagnosis not present

## 2023-05-07 DIAGNOSIS — R0602 Shortness of breath: Secondary | ICD-10-CM | POA: Diagnosis not present

## 2023-05-07 DIAGNOSIS — T45516A Underdosing of anticoagulants, initial encounter: Secondary | ICD-10-CM | POA: Diagnosis present

## 2023-05-07 DIAGNOSIS — J439 Emphysema, unspecified: Secondary | ICD-10-CM | POA: Diagnosis not present

## 2023-05-07 DIAGNOSIS — Z820 Family history of epilepsy and other diseases of the nervous system: Secondary | ICD-10-CM

## 2023-05-07 DIAGNOSIS — I714 Abdominal aortic aneurysm, without rupture, unspecified: Secondary | ICD-10-CM | POA: Diagnosis present

## 2023-05-07 DIAGNOSIS — Z91141 Patient's other noncompliance with medication regimen due to financial hardship: Secondary | ICD-10-CM

## 2023-05-07 DIAGNOSIS — E872 Acidosis, unspecified: Secondary | ICD-10-CM | POA: Diagnosis not present

## 2023-05-07 DIAGNOSIS — I7300 Raynaud's syndrome without gangrene: Secondary | ICD-10-CM | POA: Diagnosis present

## 2023-05-07 DIAGNOSIS — N1831 Chronic kidney disease, stage 3a: Secondary | ICD-10-CM | POA: Diagnosis not present

## 2023-05-07 DIAGNOSIS — J90 Pleural effusion, not elsewhere classified: Secondary | ICD-10-CM

## 2023-05-07 DIAGNOSIS — J449 Chronic obstructive pulmonary disease, unspecified: Secondary | ICD-10-CM | POA: Diagnosis not present

## 2023-05-07 DIAGNOSIS — J860 Pyothorax with fistula: Secondary | ICD-10-CM | POA: Diagnosis not present

## 2023-05-07 DIAGNOSIS — F1010 Alcohol abuse, uncomplicated: Secondary | ICD-10-CM | POA: Diagnosis present

## 2023-05-07 DIAGNOSIS — W44F9XA Other object of natural or organic material, entering into or through a natural orifice, initial encounter: Secondary | ICD-10-CM | POA: Diagnosis not present

## 2023-05-07 DIAGNOSIS — E782 Mixed hyperlipidemia: Secondary | ICD-10-CM | POA: Diagnosis not present

## 2023-05-07 DIAGNOSIS — E876 Hypokalemia: Secondary | ICD-10-CM | POA: Diagnosis not present

## 2023-05-07 DIAGNOSIS — Z79899 Other long term (current) drug therapy: Secondary | ICD-10-CM

## 2023-05-07 DIAGNOSIS — A419 Sepsis, unspecified organism: Secondary | ICD-10-CM | POA: Diagnosis present

## 2023-05-07 DIAGNOSIS — Z833 Family history of diabetes mellitus: Secondary | ICD-10-CM

## 2023-05-07 DIAGNOSIS — J9600 Acute respiratory failure, unspecified whether with hypoxia or hypercapnia: Secondary | ICD-10-CM | POA: Diagnosis not present

## 2023-05-07 DIAGNOSIS — Z9689 Presence of other specified functional implants: Secondary | ICD-10-CM | POA: Diagnosis not present

## 2023-05-07 DIAGNOSIS — I3139 Other pericardial effusion (noninflammatory): Secondary | ICD-10-CM | POA: Diagnosis not present

## 2023-05-07 DIAGNOSIS — J13 Pneumonia due to Streptococcus pneumoniae: Secondary | ICD-10-CM | POA: Diagnosis present

## 2023-05-07 DIAGNOSIS — Z515 Encounter for palliative care: Secondary | ICD-10-CM

## 2023-05-07 DIAGNOSIS — J984 Other disorders of lung: Secondary | ICD-10-CM | POA: Diagnosis not present

## 2023-05-07 DIAGNOSIS — J939 Pneumothorax, unspecified: Secondary | ICD-10-CM | POA: Diagnosis not present

## 2023-05-07 DIAGNOSIS — N179 Acute kidney failure, unspecified: Secondary | ICD-10-CM | POA: Diagnosis not present

## 2023-05-07 DIAGNOSIS — J189 Pneumonia, unspecified organism: Secondary | ICD-10-CM | POA: Diagnosis present

## 2023-05-07 DIAGNOSIS — R0600 Dyspnea, unspecified: Secondary | ICD-10-CM | POA: Diagnosis not present

## 2023-05-07 DIAGNOSIS — Z860101 Personal history of adenomatous and serrated colon polyps: Secondary | ICD-10-CM

## 2023-05-07 DIAGNOSIS — Z91041 Radiographic dye allergy status: Secondary | ICD-10-CM

## 2023-05-07 DIAGNOSIS — J432 Centrilobular emphysema: Secondary | ICD-10-CM | POA: Diagnosis not present

## 2023-05-07 DIAGNOSIS — R9389 Abnormal findings on diagnostic imaging of other specified body structures: Secondary | ICD-10-CM | POA: Diagnosis not present

## 2023-05-07 DIAGNOSIS — J948 Other specified pleural conditions: Secondary | ICD-10-CM | POA: Diagnosis not present

## 2023-05-07 DIAGNOSIS — R5381 Other malaise: Secondary | ICD-10-CM | POA: Diagnosis present

## 2023-05-07 DIAGNOSIS — Z48813 Encounter for surgical aftercare following surgery on the respiratory system: Secondary | ICD-10-CM | POA: Diagnosis not present

## 2023-05-07 DIAGNOSIS — E871 Hypo-osmolality and hyponatremia: Secondary | ICD-10-CM | POA: Diagnosis not present

## 2023-05-07 DIAGNOSIS — T17500A Unspecified foreign body in bronchus causing asphyxiation, initial encounter: Secondary | ICD-10-CM

## 2023-05-07 DIAGNOSIS — I517 Cardiomegaly: Secondary | ICD-10-CM | POA: Diagnosis not present

## 2023-05-07 DIAGNOSIS — R7881 Bacteremia: Secondary | ICD-10-CM | POA: Diagnosis not present

## 2023-05-07 LAB — RESP PANEL BY RT-PCR (RSV, FLU A&B, COVID)  RVPGX2
Influenza A by PCR: NEGATIVE
Influenza B by PCR: NEGATIVE
Resp Syncytial Virus by PCR: NEGATIVE
SARS Coronavirus 2 by RT PCR: NEGATIVE

## 2023-05-07 LAB — CBC
HCT: 40.2 % (ref 39.0–52.0)
Hemoglobin: 13.1 g/dL (ref 13.0–17.0)
MCH: 35.8 pg — ABNORMAL HIGH (ref 26.0–34.0)
MCHC: 32.6 g/dL (ref 30.0–36.0)
MCV: 109.8 fL — ABNORMAL HIGH (ref 80.0–100.0)
Platelets: 137 10*3/uL — ABNORMAL LOW (ref 150–400)
RBC: 3.66 MIL/uL — ABNORMAL LOW (ref 4.22–5.81)
RDW: 13.3 % (ref 11.5–15.5)
WBC: 11.5 10*3/uL — ABNORMAL HIGH (ref 4.0–10.5)
nRBC: 0 % (ref 0.0–0.2)

## 2023-05-07 LAB — PROTIME-INR
INR: 1.2 (ref 0.8–1.2)
Prothrombin Time: 15.6 s — ABNORMAL HIGH (ref 11.4–15.2)

## 2023-05-07 LAB — TROPONIN I (HIGH SENSITIVITY)
Troponin I (High Sensitivity): 10 ng/L (ref <18)
Troponin I (High Sensitivity): 9 ng/L (ref <18)

## 2023-05-07 LAB — BASIC METABOLIC PANEL
Anion gap: 16 — ABNORMAL HIGH (ref 5–15)
BUN: 26 mg/dL — ABNORMAL HIGH (ref 8–23)
CO2: 19 mmol/L — ABNORMAL LOW (ref 22–32)
Calcium: 8.7 mg/dL — ABNORMAL LOW (ref 8.9–10.3)
Chloride: 99 mmol/L (ref 98–111)
Creatinine, Ser: 1.54 mg/dL — ABNORMAL HIGH (ref 0.61–1.24)
GFR, Estimated: 48 mL/min — ABNORMAL LOW (ref >60)
Glucose, Bld: 84 mg/dL (ref 70–99)
Potassium: 4.6 mmol/L (ref 3.5–5.1)
Sodium: 134 mmol/L — ABNORMAL LOW (ref 135–145)

## 2023-05-07 LAB — LACTIC ACID, PLASMA: Lactic Acid, Venous: 1.5 mmol/L (ref 0.5–1.9)

## 2023-05-07 LAB — BRAIN NATRIURETIC PEPTIDE: B Natriuretic Peptide: 697 pg/mL — ABNORMAL HIGH (ref 0.0–100.0)

## 2023-05-07 MED ORDER — DILTIAZEM LOAD VIA INFUSION
20.0000 mg | Freq: Once | INTRAVENOUS | Status: AC
  Administered 2023-05-07: 20 mg via INTRAVENOUS
  Filled 2023-05-07: qty 20

## 2023-05-07 MED ORDER — SODIUM CHLORIDE 0.9 % IV SOLN
500.0000 mg | Freq: Once | INTRAVENOUS | Status: AC
  Administered 2023-05-07: 500 mg via INTRAVENOUS
  Filled 2023-05-07: qty 5

## 2023-05-07 MED ORDER — SODIUM CHLORIDE 0.9 % IV SOLN
2.0000 g | Freq: Once | INTRAVENOUS | Status: AC
  Administered 2023-05-07: 2 g via INTRAVENOUS
  Filled 2023-05-07: qty 20

## 2023-05-07 MED ORDER — DILTIAZEM HCL-DEXTROSE 125-5 MG/125ML-% IV SOLN (PREMIX)
5.0000 mg/h | INTRAVENOUS | Status: AC
  Administered 2023-05-07: 5 mg/h via INTRAVENOUS
  Administered 2023-05-08 – 2023-05-10 (×5): 15 mg/h via INTRAVENOUS
  Filled 2023-05-07 (×9): qty 125

## 2023-05-07 MED ORDER — LACTATED RINGERS IV SOLN: INTRAVENOUS | Status: AC

## 2023-05-07 NOTE — ED Triage Notes (Signed)
 Pt reports a "couple year" hx of shob and weakness but it has been getting worse the past month.  Called physician today and were told to come to the ED.  Congested cough.  Brown sputum.  Febrile today at home.  Hx of COPD.  No home O2 requirement.  Hx of afib.  On eliquis.  Wife reports he has been too weak to do anything other than go to the restroom and back.

## 2023-05-07 NOTE — ED Notes (Signed)
 ED Provider at bedside.

## 2023-05-07 NOTE — ED Provider Notes (Addendum)
 [place] EMERGENCY DEPARTMENT AT [facility] Provider Note   CSN: [id number] Arrival date & time: 05/07/23  1911     History  Chief Complaint  Patient presents with   Shortness of Breath    Anthony Little is a 73 y.o. male.  HPI    73 year old male comes in with chief complaint of shortness of breath Patient has past medical history of A-fib and he admits to noncompliance to his Eliquis due to cost issues.  He also has past medical history of pleural effusions requiring thoracentesis in 2023.  Patient states that she has been feeling shortness of breath for the last several days, maybe a month.  However in the last 2 days, his shortness of breath has progressed.   Patient does not have any chest pain, cough.  Wife is at the bedside, states that patient may be is having subjective fevers.  Patient unaware that he is in A-fib with RVR.  He has been taking his beta-blocker as prescribed.  There is no history of PE, DVT.  Patient does not have any personal history of cancer that is active.  Home Medications Prior to Admission medications   Medication Sig Start Date End Date Taking? Authorizing Provider  albuterol (PROVENTIL) (2.5 MG/3ML) 0.083% nebulizer solution Take 3 mLs (2.5 mg total) by nebulization every 6 (six) hours as needed for wheezing or shortness of breath. 07/11/22   Coralyn Helling, MD  buPROPion (WELLBUTRIN XL) 150 MG 24 hr tablet TAKE 1 TABLET BY MOUTH EVERY DAY Patient taking differently: Take 150 mg by mouth daily. 01/26/21   Babs Sciara, MD  ELIQUIS 5 MG TABS tablet TAKE 1 TABLET BY MOUTH TWICE A DAY 03/12/23   Jonelle Sidle, MD  Fluticasone-Umeclidin-Vilant (TRELEGY ELLIPTA) 100-62.5-25 MCG/ACT AEPB Inhale 1 puff into the lungs daily in the afternoon. 12/29/22   Shon Hale, MD  furosemide (LASIX) 20 MG tablet Take 1 tablet (20 mg total) by mouth daily. 02/12/23   Babs Sciara, MD  glucosamine-chondroitin 500-400 MG tablet Take 2  tablets by mouth every morning.    [provider]  Glycerin-Hypromellose-PEG 400 (DRY EYE RELIEF DROPS OP) Apply 2 drops to eye daily as needed (irritation). Thera tears    [provider]  Melatonin 10 MG TABS Take 1 tablet by mouth at bedtime.    [provider]  metoprolol succinate (TOPROL-XL) 25 MG 24 hr tablet TAKE 1 TABLET (25 MG TOTAL) BY MOUTH DAILY. 11/27/22   Babs Sciara, MD  potassium chloride SA (KLOR-CON M) 20 MEQ tablet TAKE 1 TABLET BY MOUTH EVERY DAY 03/09/23   Babs Sciara, MD  Probiotic Product (PROBIOTIC ADVANCED PO) Take 1 tablet by mouth daily.    [provider]  rosuvastatin (CRESTOR) 10 MG tablet TAKE 1 TABLET BY MOUTH EVERY DAY 01/15/23   Babs Sciara, MD  sertraline (ZOLOFT) 50 MG tablet TAKE 1 TABLET BY MOUTH EVERY DAY 01/15/23   Babs Sciara, MD      Allergies    Ivp dye [iodinated contrast media]    Review of Systems   Review of Systems  All other systems reviewed and are negative.   Physical Exam Updated Vital Signs BP (!) 151/81   Pulse (!) 129   Temp 97.7 F (36.5 C) (Oral)   Resp (!) 34   SpO2 96%  Physical Exam Vitals and nursing note reviewed.  Constitutional:      General: He is not in acute distress.  Appearance: He is well-developed. He is ill-appearing. He is not toxic-appearing.  HENT:     Head: Atraumatic.  Cardiovascular:     Rate and Rhythm: Normal rate.  Pulmonary:     Effort: Pulmonary effort is normal. Tachypnea present.     Breath sounds: Examination of the right-lower field reveals decreased breath sounds. Decreased breath sounds present. No wheezing or rales.  Musculoskeletal:     Cervical back: Neck supple.     Right lower leg: Edema present.     Left lower leg: No edema.  Skin:    General: Skin is warm.  Neurological:     Mental Status: He is alert and oriented to person, place, and time.     ED Results / Procedures / Treatments   Labs (all labs ordered are listed,  but only abnormal results are displayed) Labs Reviewed  BASIC METABOLIC PANEL - Abnormal; Notable for the following components:      Result Value   Sodium 134 (*)    CO2 19 (*)    BUN 26 (*)    Creatinine, Ser 1.54 (*)    Calcium 8.7 (*)    GFR, Estimated 48 (*)    Anion gap 16 (*)    All other components within normal limits  CBC - Abnormal; Notable for the following components:   WBC 11.5 (*)    RBC 3.66 (*)    MCV 109.8 (*)    MCH 35.8 (*)    Platelets 137 (*)    All other components within normal limits  PROTIME-INR - Abnormal; Notable for the following components:   Prothrombin Time 15.6 (*)    All other components within normal limits  BRAIN NATRIURETIC PEPTIDE - Abnormal; Notable for the following components:   B Natriuretic Peptide 697.0 (*)    All other components within normal limits  RESP PANEL BY RT-PCR (RSV, FLU A&B, COVID)  RVPGX2  CULTURE, BLOOD (ROUTINE X 2)  CULTURE, BLOOD (ROUTINE X 2)  LACTIC ACID, PLASMA  LACTIC ACID, PLASMA  TROPONIN I (HIGH SENSITIVITY)  TROPONIN I (HIGH SENSITIVITY)    EKG None  Date: 05/07/2023  Rate: 152  Rhythm: Atrial fibrillation with RVR  QRS Axis: normal  Intervals: normal  ST/T Wave abnormalities: normal  Conduction Disutrbances: none  Narrative Interpretation: New changes noted   Radiology DG Chest 2 View Result Date: 05/07/2023 CLINICAL DATA:  Short of breath, progressive weakness EXAM: CHEST - 2 VIEW COMPARISON:  12/28/2022 FINDINGS: Frontal and lateral views of the chest demonstrate an unremarkable cardiac silhouette. There is dense left basilar consolidation, with moderate left pleural effusion which is partially loculated. Right chest is clear. No pneumothorax. No acute bony abnormalities. IMPRESSION: 1. Dense left basilar consolidation consistent with pneumonia. 2. Moderate left pleural effusion, partially loculated. Electronically Signed   By: Sharlet Salina M.D.   On: 05/07/2023 21:41    Procedures .Critical  Care  Performed by: Derwood Kaplan, MD Authorized by: Derwood Kaplan, MD   Critical care provider statement:    Critical care time (minutes):  58   Critical care was necessary to treat or prevent imminent or life-threatening deterioration of the following conditions:  Circulatory failure   Critical care was time spent personally by me on the following activities:  Development of treatment plan with patient or surrogate, discussions with consultants, evaluation of patient's response to treatment, examination of patient, ordering and review of laboratory studies, ordering and review of radiographic studies, ordering and performing treatments and interventions, pulse oximetry,  re-evaluation of patient's condition and review of old charts     Medications Ordered in ED Medications  diltiazem (CARDIZEM) 1 mg/mL load via infusion 20 mg (20 mg Intravenous Bolus from Bag 05/07/23 2105)    And  diltiazem (CARDIZEM) 125 mg in dextrose 5% 125 mL (1 mg/mL) infusion (5 mg/hr Intravenous New Bag/Given 05/07/23 2102)  lactated ringers infusion (0 mLs Intravenous Hold 05/07/23 2302)  azithromycin (ZITHROMAX) 500 mg in sodium chloride 0.9 % 250 mL IVPB (500 mg Intravenous New Bag/Given 05/07/23 2257)  cefTRIAXone (ROCEPHIN) 2 g in sodium chloride 0.9 % 100 mL IVPB (0 g Intravenous Stopped 05/07/23 2321)    ED Course/ Medical Decision Making/ A&P                                 Medical Decision Making Amount and/or Complexity of Data Reviewed Labs: ordered. Radiology: ordered.  Risk Prescription drug management. Decision regarding hospitalization.   This patient presents to the ED with chief complaint(s) of shortness of breath with pertinent past medical history of A-fib with noncompliance to Eliquis, CAD, hypertension.The complaint involves an extensive differential diagnosis and also carries with it a high risk of complications and morbidity.    The differential diagnosis includes : Congestive  heart failure because of A-fib with RVR, symptomatic A-fib, acute coronary syndrome, pleural effusion, pulmonary edema, pneumonia, influenza, severe anemia, thyroid storm, withdrawals from alcohol  The initial plan is to get basic labs, chest x-ray.  Additional history obtained: Additional history obtained from spouse Records reviewed previous admission documents and previous cardiology notes, previous x-rays also noted with results from the thoracentesis that revealed no abscess.  Independent labs interpretation:  The following labs were independently interpreted: CBC, BMP are overall reassuring.  No profound anemia, no leukocytosis.  COVID test is negative.  Independent visualization and interpretation of imaging: - I independently visualized the following imaging with scope of interpretation limited to determining acute life threatening conditions related to emergency care: X-ray of the chest, which revealed left-sided pleural effusion..  Allergies, there could be pneumonia.  Per radiologist, pneumonia could be possible. I think it would be best to initiate treatment.  Treatment and Reassessment: Patient reassessed at 10:20 PM.  He still in A-fib with RVR.  questionable sepsis, based on x-ray -therefore code sepsis activation completed.  I did discuss the case with the interventional radiologist, Dr. Kenn File.  He states that normally there is radiologist available for ultrasound Pera, thoracentesis during business hours at [facility] on weekdays.  However, he is unsure as he does not have schedule who is working Advertising account executive.  Will proceed with admission request. Primary focus will be to put patient on diltiazem drip, give him antibiotics and have hospitalist order thoracentesis for tomorrow.  From my perspective, patient is clinically stable at this time and does not need emergent thoracentesis.  His last Eliquis dose was likely over the weekend, but not this morning. Final Clinical  Impression(s) / ED Diagnoses Final diagnoses:  Atrial fibrillation with RVR (HCC)  Pleural effusion  Acute hypoxemic respiratory failure (HCC)  Severe sepsis [facility])    Rx / DC Orders ED Discharge Orders     None            Derwood Kaplan, MD 05/07/23 2341

## 2023-05-07 NOTE — Telephone Encounter (Signed)
  Chief Complaint: Cough Symptoms: cough, fatigue, decreased appetite, red puffy cheeks Frequency: Chronic, worsening over the last month Pertinent Negatives: Patient denies CP, fever Disposition: [x] ED /[] Urgent Care (no appt availability in office) / [] Appointment(In office/virtual)/ []  [place] [facility]/ [] Home Care/ [] Refused Recommended Disposition /[] [place] [facility]/ []  Follow-up with PCP Additional Notes: Patient wife, Anthony Little on [place], calls reporting dry cough, fatigue, decreased appetite, red puffy cheeks, SOB with mild activity. States patient is chronically ill but declining. States patient is an alcoholic and drinks daily, but decreased amounts recently. Per protocol, patient to present to ED now for evaluation based of symptoms. Care advice reviewed, caller verbalized understanding and denies further questions at this time.  Alerting PCP for review.   Copied from CRM [id number]. Topic: Clinical - Red Word Triage >> May 07, 2023  3:11 PM Gaetano Hawthorne wrote: [facility] that prompted transfer to Nurse Triage:  not able to walk very far - he gets really cold and out of breath and he almost passes out at times - he is not eating. he sleeping a lot and not getting up. Reason for Disposition  Patient sounds very sick or weak to the triager  Answer Assessment - Initial Assessment Questions 1. ONSET: "When did the cough begin?"      Chronic, worsening the last month. 2. SEVERITY: "How bad is the cough today?"      Pretty bad if he doesn't take delsym 3. SPUTUM: "Describe the color of your sputum" (none, dry cough; clear, white, yellow, green)     Dry cough 4. HEMOPTYSIS: "Are you coughing up any blood?" If so ask: "How much?" (flecks, streaks, tablespoons, etc.)     Denies 5. DIFFICULTY BREATHING: "Are you having difficulty breathing?" If Yes, ask: "How bad is it?" (e.g., mild, moderate, severe)    - MILD: No SOB at rest, mild SOB with walking, speaks normally in sentences, can lie down,  no retractions, pulse < 100.    - MODERATE: SOB at rest, SOB with minimal exertion and prefers to sit, cannot lie down flat, speaks in phrases, mild retractions, audible wheezing, pulse 100-120.    - SEVERE: Very SOB at rest, speaks in single words, struggling to breathe, sitting hunched forward, retractions, pulse > 120      Moderate, with activity to the bathroom- greater than 15 feet 6. FEVER: "Do you have a fever?" If Yes, ask: "What is your temperature, how was it measured, and when did it start?"     Denies 7. CARDIAC HISTORY: "Do you have any history of heart disease?" (e.g., heart attack, congestive heart failure)      Afib "a lot", aneurysms 8. LUNG HISTORY: "Do you have any history of lung disease?"  (e.g., pulmonary embolus, asthma, emphysema)     COPD, emphysema 9. PE RISK FACTORS: "Do you have a history of blood clots?" (or: recent major surgery, recent prolonged travel, bedridden)     Denies 10. OTHER SYMPTOMS: "Do you have any other symptoms?" (e.g., runny nose, wheezing, chest pain)       Fatigue, not eating, sleeping a lot, red puffy cheeks  Protocols used: Cough - Acute Non-Productive-A-AH

## 2023-05-07 NOTE — Sepsis Progress Note (Signed)
 Elink monitoring for the code sepsis protocol.

## 2023-05-07 NOTE — H&P (Incomplete)
 History and Physical    Patient: Anthony Little:[id number] DOB: 12/21/1950 DOA: 05/07/2023 DOS: the patient was seen and examined on 05/08/2023 PCP: Anthony Sciara, MD  Patient coming from: Home  Chief Complaint:  Chief Complaint  Patient presents with   Shortness of Breath   HPI: Anthony Little is a 73 y.o. male with medical history significant of hypertension, hyperlipidemia, paroxysmal atrial fibrillation, CKD 3A, COPD, AAA, history of tobacco abuse, alcohol abuse who presented to the emergency department due to several weeks of slowly progressive shortness of breath which worsened in the last 2 days.  He endorsed having subjective fevers.  Patient states that fluid was once drawn from his chest about 2 years ago.  ED Course:  In the emergency department, patient was tachypneic, BP was 136/92, other vital signs were within normal range.  Workup in the ED showed WBC of 11.5, hemoglobin 13.1 hematocrit 40.2, MCV 109.8, platelets 137.  BMP was normal except for sodium 134, bicarb 19, BUN 26, creatinine 1.54, BNP 697 (this was 642 on 02/13/22).  Magnesium 1.4.  Influenza A, B, SARS, respiratory, RSV was negative. Chest x-ray showed dense left basilar consolidation consistent with pneumonia.  Moderate left pleural effusion partially loculated. Patient was started on IV ceftriaxone and azithromycin, IV Cardizem drip was started.  Hospitalist was asked to admit patient for further evaluation and management.  Review of Systems: Review of systems as noted in the HPI. All other systems reviewed and are negative.   Past Medical History:  Diagnosis Date   Abdominal aortic aneurysm (AAA) (HCC)    Alcoholic (HCC)    Atrial fibrillation and flutter (HCC)    CKD (chronic kidney disease) stage 3, GFR 30-59 ml/min (HCC)    CVA (cerebral infarction)    Essential hypertension    Hyperlipidemia    Thoracic aortic aneurysm [facility])    Past Surgical History:  Procedure Laterality Date    BIOPSY  08/17/2020   Procedure: BIOPSY;  Surgeon: Anthony Bal, DO;  [facility];  Service: Endoscopy;;   BIOPSY  02/21/2021   Procedure: BIOPSY;  Surgeon: Anthony Bal, DO;  [facility]: [facility];  Service: Endoscopy;;   COLONOSCOPY  12/2009   Diverticulosis, mild internal hemorrhoids, multiple colon polyps removed (adenomatous). One irregular shaped polyp was tattooed. Next colonoscopy due October 2016.   COLONOSCOPY N/A 11/01/2015   Procedure: COLONOSCOPY;  Surgeon: Anthony Bali, MD;  Location: [facility];  Service: Endoscopy;  Laterality: N/A;  100   COLONOSCOPY WITH PROPOFOL N/A 08/17/2020   Procedure: COLONOSCOPY WITH PROPOFOL;  Surgeon: Anthony Bal, DO;  [facility]: [facility];  Service: Endoscopy;  Laterality: N/A;  2:15pm   ESOPHAGOGASTRODUODENOSCOPY N/A 10/08/2012   SLF: Moderate erosive gastritis/duodenitis. Biopsies benign.   ESOPHAGOGASTRODUODENOSCOPY N/A 11/01/2015   Procedure: ESOPHAGOGASTRODUODENOSCOPY (EGD);  Surgeon: Anthony Bali, MD;  [facility];  Service: Endoscopy;  Laterality: N/A;   ESOPHAGOGASTRODUODENOSCOPY (EGD) WITH PROPOFOL N/A 02/21/2021   Procedure: ESOPHAGOGASTRODUODENOSCOPY (EGD) WITH PROPOFOL;  Surgeon: Anthony Bal, DO;  [facility];  Service: Endoscopy;  Laterality: N/A;   POLYPECTOMY  08/17/2020   Procedure: POLYPECTOMY;  Surgeon: Anthony Bal, DO;  [facility];  Service: Endoscopy;;    Social History:  reports that he has been smoking cigarettes. He started smoking about 7 years ago. He has a 3.8 pack-year smoking history. He has never used smokeless tobacco. He reports current alcohol use of about 4.0 standard  drinks of alcohol per week. He reports that he does not use drugs.   Allergies  Allergen Reactions   Ivp Dye [Iodinated Contrast Media] Nausea And Vomiting    Family History  Problem Relation Age of Onset   Diabetes Mother    Parkinson's disease Father    Diabetes  Maternal Grandfather    Ulcers Neg Hx    Colon cancer Neg Hx      Prior to Admission medications   Medication Sig Start Date End Date Taking? Authorizing Provider  albuterol (PROVENTIL) (2.5 MG/3ML) 0.083% nebulizer solution Take 3 mLs (2.5 mg total) by nebulization every 6 (six) hours as needed for wheezing or shortness of breath. 07/11/22   Anthony Helling, MD  buPROPion (WELLBUTRIN XL) 150 MG 24 hr tablet TAKE 1 TABLET BY MOUTH EVERY DAY Patient taking differently: Take 150 mg by mouth daily. 01/26/21   Anthony Sciara, MD  ELIQUIS 5 MG TABS tablet TAKE 1 TABLET BY MOUTH TWICE A DAY 03/12/23   Anthony Sidle, MD  Fluticasone-Umeclidin-Vilant (TRELEGY ELLIPTA) 100-62.5-25 MCG/ACT AEPB Inhale 1 puff into the lungs daily in the afternoon. 12/29/22   Anthony Hale, MD  furosemide (LASIX) 20 MG tablet Take 1 tablet (20 mg total) by mouth daily. 02/12/23   Anthony Sciara, MD  glucosamine-chondroitin 500-400 MG tablet Take 2 tablets by mouth every morning.    [provider]  Glycerin-Hypromellose-PEG 400 (DRY EYE RELIEF DROPS OP) Apply 2 drops to eye daily as needed (irritation). Thera tears    [provider]  Melatonin 10 MG TABS Take 1 tablet by mouth at bedtime.    [provider]  metoprolol succinate (TOPROL-XL) 25 MG 24 hr tablet TAKE 1 TABLET (25 MG TOTAL) BY MOUTH DAILY. 11/27/22   Anthony Sciara, MD  potassium chloride SA (KLOR-CON M) 20 MEQ tablet TAKE 1 TABLET BY MOUTH EVERY DAY 03/09/23   Anthony Sciara, MD  Probiotic Product (PROBIOTIC ADVANCED PO) Take 1 tablet by mouth daily.    [provider]  rosuvastatin (CRESTOR) 10 MG tablet TAKE 1 TABLET BY MOUTH EVERY DAY 01/15/23   Anthony Sciara, MD  sertraline (ZOLOFT) 50 MG tablet TAKE 1 TABLET BY MOUTH EVERY DAY 01/15/23   Anthony Sciara, MD    Physical Exam: BP (!) 151/86   Pulse (!) 112   Temp (!) 97.4 F (36.3 C) (Axillary)   Resp (!) 28   Ht 5\' 10"  (1.778 m)   Wt 86.5 kg   SpO2 98%    BMI 27.36 kg/m   General: 73 y.o. year-old male well developed ill-appearing, but in no acute distress.  Alert and oriented x3. HEENT: NCAT, EOMI Neck: Supple, trachea medial Cardiovascular: Irregular rate and rhythm with no rubs or gallops.  No thyromegaly or JVD noted.  No lower extremity edema. 2/4 pulses in all 4 extremities. Respiratory: Tachypneic.  Decreased breath sounds in in lower lobes (L  > R ).  Clear to auscultation with no wheezes or rales. Good inspiratory effort. Abdomen: Soft, nontender nondistended with normal bowel sounds x4 quadrants. Muskuloskeletal: No cyanosis, clubbing or edema noted bilaterally Neuro: CN II-XII intact, strength 5/5 x 4, sensation, reflexes intact Skin: No ulcerative lesions noted or rashes Psychiatry: Judgement and insight appear normal. Mood is appropriate for condition and setting          Labs on Admission:  Basic Metabolic Panel: Recent Labs  Lab 05/07/23 2012 05/08/23 0548  NA 134* 135  K 4.6 3.7  CL 99 102  CO2 19* 22  GLUCOSE 84 85  BUN 26* 29*  CREATININE 1.54* 1.34*  CALCIUM 8.7* 8.1*  MG  --  1.4*  PHOS  --  3.7   Liver Function Tests: Recent Labs  Lab 05/08/23 0548  AST 25  ALT 12  ALKPHOS 77  BILITOT 1.5*  PROT 6.2*  ALBUMIN 2.3*   No results for input(s): "LIPASE", "AMYLASE" in the last 168 hours. No results for input(s): "AMMONIA" in the last 168 hours. CBC: Recent Labs  Lab 05/07/23 2012 05/08/23 0548  WBC 11.5* 6.2  HGB 13.1 11.0*  HCT 40.2 33.7*  MCV 109.8* 104.7*  PLT 137* 111*   Cardiac Enzymes: No results for input(s): "CKTOTAL", "CKMB", "CKMBINDEX", "TROPONINI" in the last 168 hours.  BNP (last 3 results) Recent Labs    05/07/23 2112  BNP 697.0*    ProBNP (last 3 results) No results for input(s): "PROBNP" in the last 8760 hours.  CBG: No results for input(s): "GLUCAP" in the last 168 hours.  Radiological Exams on Admission: DG Chest 2 View Result Date: 05/07/2023 CLINICAL DATA:   Short of breath, progressive weakness EXAM: CHEST - 2 VIEW COMPARISON:  12/28/2022 FINDINGS: Frontal and lateral views of the chest demonstrate an unremarkable cardiac silhouette. There is dense left basilar consolidation, with moderate left pleural effusion which is partially loculated. Right chest is clear. No pneumothorax. No acute bony abnormalities. IMPRESSION: 1. Dense left basilar consolidation consistent with pneumonia. 2. Moderate left pleural effusion, partially loculated. Electronically Signed   By: Sharlet Salina M.D.   On: 05/07/2023 21:41    EKG: I independently viewed the EKG done and my findings are as followed: A-fib with RVR  Assessment/Plan Present on Admission:  Sepsis due to pneumonia (HCC)  COPD (chronic obstructive pulmonary disease) (HCC)  Mixed hyperlipidemia  Principal Problem:   Sepsis due to pneumonia (HCC) Active Problems:   Mixed hyperlipidemia   COPD (chronic obstructive pulmonary disease) (HCC)   Pleural effusion on left   Paroxysmal atrial fibrillation (HCC)  Sepsis due to pneumonia Patient met sepsis criteria due to being tachycardic, tachypneic with lung as source of infection. Patient was started on ceftriaxone and azithromycin, we shall continue same at this time with plan to de-escalate/discontinue based on blood culture, sputum culture, urine Legionella, strep pneumo and procalcitonin Continue Tylenol as needed Continue Mucinex, incentive spirometry, flutter valve   Left pleural effusion IR was consulted for thoracentesis Last Eliquis dose was on Friday night (05/03/2022) per patient  Paroxysmal atrial fibrillation Continue IV Cardizem drip Consider transitioning to home metoprolol when HR is better controlled  Elevated MCV MCV 100.8, vitamin B12 and folate levels will be checked  Thrombocytopenia possibly reactive Platelets 137, continue to monitor platelet levels  COPD (not in acute exacerbation) Continue albuterol per home  regimen  Mixed hyperlipidemia Continue Crestor   DVT prophylaxis: SCDs  Code Status: Full code  Family Communication: None at bedside  Consults: IR  Severity of Illness: The appropriate patient status for this patient is INPATIENT. Inpatient status is judged to be reasonable and necessary in order to provide the required intensity of service to ensure the patient's safety. The patient's presenting symptoms, physical exam findings, and initial radiographic and laboratory data in the context of their chronic comorbidities is felt to place them at high risk for further clinical deterioration. Furthermore, it is not anticipated that the patient will be medically stable for discharge from the hospital within 2 midnights of admission.   *  I certify that at the point of admission it is my clinical judgment that the patient will require inpatient hospital care spanning beyond 2 midnights from the point of admission due to high intensity of service, high risk for further deterioration and high frequency of surveillance required.*  Author: Frankey Shown, DO 05/08/2023 7:07 AM  For on call review [email or web address].

## 2023-05-08 ENCOUNTER — Other Ambulatory Visit (HOSPITAL_COMMUNITY): Payer: Self-pay | Admitting: *Deleted

## 2023-05-08 ENCOUNTER — Inpatient Hospital Stay (HOSPITAL_COMMUNITY): Payer: Medicare Other

## 2023-05-08 DIAGNOSIS — J189 Pneumonia, unspecified organism: Secondary | ICD-10-CM | POA: Diagnosis not present

## 2023-05-08 DIAGNOSIS — I3139 Other pericardial effusion (noninflammatory): Secondary | ICD-10-CM

## 2023-05-08 DIAGNOSIS — J90 Pleural effusion, not elsewhere classified: Secondary | ICD-10-CM | POA: Diagnosis not present

## 2023-05-08 DIAGNOSIS — J9601 Acute respiratory failure with hypoxia: Secondary | ICD-10-CM | POA: Diagnosis not present

## 2023-05-08 DIAGNOSIS — A419 Sepsis, unspecified organism: Secondary | ICD-10-CM | POA: Diagnosis not present

## 2023-05-08 DIAGNOSIS — R7881 Bacteremia: Secondary | ICD-10-CM

## 2023-05-08 DIAGNOSIS — I480 Paroxysmal atrial fibrillation: Secondary | ICD-10-CM | POA: Insufficient documentation

## 2023-05-08 LAB — ECHOCARDIOGRAM COMPLETE
Area-P 1/2: 5.02 cm2
Height: 70 in
S' Lateral: 3.6 cm
Weight: 3051.17 [oz_av]

## 2023-05-08 LAB — MRSA NEXT GEN BY PCR, NASAL: MRSA by PCR Next Gen: NOT DETECTED

## 2023-05-08 LAB — BLOOD CULTURE ID PANEL (REFLEXED) - BCID2

## 2023-05-08 LAB — COMPREHENSIVE METABOLIC PANEL
ALT: 12 U/L (ref 0–44)
AST: 25 U/L (ref 15–41)
Albumin: 2.3 g/dL — ABNORMAL LOW (ref 3.5–5.0)
Alkaline Phosphatase: 77 U/L (ref 38–126)
Anion gap: 11 (ref 5–15)
BUN: 29 mg/dL — ABNORMAL HIGH (ref 8–23)
CO2: 22 mmol/L (ref 22–32)
Calcium: 8.1 mg/dL — ABNORMAL LOW (ref 8.9–10.3)
Chloride: 102 mmol/L (ref 98–111)
Creatinine, Ser: 1.34 mg/dL — ABNORMAL HIGH (ref 0.61–1.24)
GFR, Estimated: 57 mL/min — ABNORMAL LOW (ref >60)
Glucose, Bld: 85 mg/dL (ref 70–99)
Potassium: 3.7 mmol/L (ref 3.5–5.1)
Sodium: 135 mmol/L (ref 135–145)
Total Bilirubin: 1.5 mg/dL — ABNORMAL HIGH (ref 0.0–1.2)
Total Protein: 6.2 g/dL — ABNORMAL LOW (ref 6.5–8.1)

## 2023-05-08 LAB — CBC
HCT: 33.7 % — ABNORMAL LOW (ref 39.0–52.0)
Hemoglobin: 11 g/dL — ABNORMAL LOW (ref 13.0–17.0)
MCH: 34.2 pg — ABNORMAL HIGH (ref 26.0–34.0)
MCHC: 32.6 g/dL (ref 30.0–36.0)
MCV: 104.7 fL — ABNORMAL HIGH (ref 80.0–100.0)
Platelets: 111 10*3/uL — ABNORMAL LOW (ref 150–400)
RBC: 3.22 MIL/uL — ABNORMAL LOW (ref 4.22–5.81)
RDW: 13 % (ref 11.5–15.5)
WBC: 6.2 10*3/uL (ref 4.0–10.5)
nRBC: 0 % (ref 0.0–0.2)

## 2023-05-08 LAB — FOLATE: Folate: 4.3 ng/mL — ABNORMAL LOW (ref >5.9)

## 2023-05-08 LAB — VITAMIN B12: Vitamin B-12: 299 pg/mL (ref 180–914)

## 2023-05-08 LAB — PHOSPHORUS: Phosphorus: 3.7 mg/dL (ref 2.5–4.6)

## 2023-05-08 LAB — PROCALCITONIN: Procalcitonin: 2.62 ng/mL

## 2023-05-08 LAB — MAGNESIUM: Magnesium: 1.4 mg/dL — ABNORMAL LOW (ref 1.7–2.4)

## 2023-05-08 MED ORDER — ONDANSETRON HCL 4 MG PO TABS: 4.0000 mg | ORAL_TABLET | Freq: Four times a day (QID) | ORAL | Status: DC | PRN

## 2023-05-08 MED ORDER — ENOXAPARIN SODIUM 40 MG/0.4ML IJ SOSY: 40.0000 mg | PREFILLED_SYRINGE | INTRAMUSCULAR | Status: DC

## 2023-05-08 MED ORDER — HYDROXYZINE HCL 10 MG PO TABS
10.0000 mg | ORAL_TABLET | Freq: Three times a day (TID) | ORAL | Status: DC | PRN
  Administered 2023-05-08: 10 mg via ORAL
  Filled 2023-05-08: qty 1

## 2023-05-08 MED ORDER — SODIUM CHLORIDE 0.9 % IV SOLN
1.0000 g | INTRAVENOUS | Status: DC
  Administered 2023-05-08: 1 g via INTRAVENOUS
  Filled 2023-05-08: qty 10

## 2023-05-08 MED ORDER — SODIUM CHLORIDE 0.9 % IV SOLN
500.0000 mg | INTRAVENOUS | Status: DC
  Administered 2023-05-08 – 2023-05-10 (×3): 500 mg via INTRAVENOUS
  Filled 2023-05-08 (×3): qty 5

## 2023-05-08 MED ORDER — ACETAMINOPHEN 325 MG PO TABS
650.0000 mg | ORAL_TABLET | Freq: Four times a day (QID) | ORAL | Status: DC | PRN
  Administered 2023-05-09 – 2023-05-10 (×2): 650 mg via ORAL
  Filled 2023-05-08 (×2): qty 2

## 2023-05-08 MED ORDER — VANCOMYCIN HCL 1500 MG/300ML IV SOLN: 1500.0000 mg | INTRAVENOUS | Status: DC

## 2023-05-08 MED ORDER — FOLIC ACID 1 MG PO TABS
1.0000 mg | ORAL_TABLET | Freq: Every day | ORAL | Status: DC
Start: 2023-05-08 — End: 2023-05-15
  Administered 2023-05-08 – 2023-05-13 (×6): 1 mg via ORAL
  Filled 2023-05-08 (×6): qty 1

## 2023-05-08 MED ORDER — CHLORHEXIDINE GLUCONATE CLOTH 2 % EX PADS
6.0000 | MEDICATED_PAD | Freq: Every day | CUTANEOUS | Status: DC
  Administered 2023-05-08 – 2023-05-09 (×2): 6 via TOPICAL

## 2023-05-08 MED ORDER — ROSUVASTATIN CALCIUM 5 MG PO TABS
10.0000 mg | ORAL_TABLET | Freq: Every day | ORAL | Status: DC
  Administered 2023-05-09 – 2023-05-14 (×6): 10 mg via ORAL
  Filled 2023-05-08 (×3): qty 2
  Filled 2023-05-08: qty 1
  Filled 2023-05-08 (×2): qty 2

## 2023-05-08 MED ORDER — VANCOMYCIN HCL 1750 MG/350ML IV SOLN
1750.0000 mg | Freq: Once | INTRAVENOUS | Status: AC
  Administered 2023-05-08: 1750 mg via INTRAVENOUS
  Filled 2023-05-08: qty 350

## 2023-05-08 MED ORDER — ONDANSETRON HCL 4 MG/2ML IJ SOLN
4.0000 mg | Freq: Four times a day (QID) | INTRAMUSCULAR | Status: DC | PRN
  Administered 2023-05-12: 4 mg via INTRAVENOUS
  Filled 2023-05-08: qty 2

## 2023-05-08 MED ORDER — MAGNESIUM SULFATE 2 GM/50ML IV SOLN
2.0000 g | Freq: Once | INTRAVENOUS | Status: AC
  Administered 2023-05-08: 2 g via INTRAVENOUS
  Filled 2023-05-08: qty 50

## 2023-05-08 MED ORDER — ACETAMINOPHEN 650 MG RE SUPP: 650.0000 mg | Freq: Four times a day (QID) | RECTAL | Status: DC | PRN

## 2023-05-08 MED ORDER — FUROSEMIDE 10 MG/ML IJ SOLN
60.0000 mg | Freq: Three times a day (TID) | INTRAMUSCULAR | Status: DC
  Administered 2023-05-08 – 2023-05-09 (×2): 60 mg via INTRAVENOUS
  Filled 2023-05-08 (×3): qty 6

## 2023-05-08 MED ORDER — SERTRALINE HCL 50 MG PO TABS
50.0000 mg | ORAL_TABLET | Freq: Every day | ORAL | Status: DC
  Administered 2023-05-09 – 2023-05-13 (×5): 50 mg via ORAL
  Filled 2023-05-08 (×5): qty 1

## 2023-05-08 MED ORDER — DM-GUAIFENESIN ER 30-600 MG PO TB12
1.0000 | ORAL_TABLET | Freq: Two times a day (BID) | ORAL | Status: DC
  Administered 2023-05-08 – 2023-05-13 (×12): 1 via ORAL
  Filled 2023-05-08 (×15): qty 1

## 2023-05-08 MED ORDER — FUROSEMIDE 10 MG/ML IJ SOLN
40.0000 mg | Freq: Two times a day (BID) | INTRAMUSCULAR | Status: DC
Start: 2023-05-08 — End: 2023-05-08
  Administered 2023-05-08: 40 mg via INTRAVENOUS
  Filled 2023-05-08: qty 4

## 2023-05-08 NOTE — Consult Note (Addendum)
 Virtual Visit Note  I connected with Anthony Little @ [facility]  @TODAY  by a video enabled telemedicine application and verified that I am speaking with the correct person using two identifiers.  Location: Patient: [facility] Provider: [facility]   I discussed the limitations of evaluation and management by telemedicine. The patient expressed understanding and agreed to proceed.  [facility]  Patient Active Problem List   Diagnosis Date Noted   Pleural effusion on left 05/08/2023   Paroxysmal atrial fibrillation (HCC) 05/08/2023   Sepsis due to pneumonia (HCC) 05/07/2023   Symptomatic hypotension 12/28/2022   Anemia due to alcoholism (HCC) 06/13/2022   Tinnitus of both ears 12/08/2021   COPD (chronic obstructive pulmonary disease) (HCC) 07/06/2021   Depression, major, single episode, moderate (HCC) 04/26/2021   Gastritis without bleeding    Atrial fibrillation and flutter (HCC)    CKD (chronic kidney disease) stage 3, GFR 30-59 ml/min (HCC)    Purpura senilis (HCC) 03/18/2020   Pulmonary nodule 07/14/2019   Ectatic thoracic aorta (HCC) 07/14/2019   Coronary artery disease involving native coronary artery of native heart with angina pectoris (HCC)    Aortic atherosclerosis (HCC) 03/07/2016   Mixed hyperlipidemia 01/14/2016   History of colonic polyps 10/21/2015   Essential hypertension 06/08/2014   Brain aneurysm 09/04/2013   Alcohol abuse 09/04/2013    Current Discharge Medication List     CONTINUE these medications which have NOT CHANGED   Details  albuterol (PROVENTIL) (2.5 MG/3ML) 0.083% nebulizer solution Take 3 mLs (2.5 mg total) by nebulization every 6 (six) hours as needed for wheezing or shortness of breath. Qty: 75 mL, Refills: 12    buPROPion (WELLBUTRIN XL) 150 MG 24 hr tablet TAKE 1 TABLET BY MOUTH EVERY DAY Qty: 90 tablet, Refills: 2    ELIQUIS 5 MG TABS tablet TAKE 1 TABLET BY MOUTH TWICE A DAY Qty: 60 tablet, Refills: 5     Fluticasone-Umeclidin-Vilant (TRELEGY ELLIPTA) 100-62.5-25 MCG/ACT AEPB Inhale 1 puff into the lungs daily in the afternoon. Qty: 60 each, Refills: 5    furosemide (LASIX) 20 MG tablet Take 1 tablet (20 mg total) by mouth daily. Qty: 30 tablet, Refills: 3    glucosamine-chondroitin 500-400 MG tablet Take 2 tablets by mouth every morning.    Glycerin-Hypromellose-PEG 400 (DRY EYE RELIEF DROPS OP) Apply 2 drops to eye daily as needed (irritation). Thera tears    Melatonin 10 MG TABS Take 1 tablet by mouth at bedtime.    metoprolol succinate (TOPROL-XL) 25 MG 24 hr tablet TAKE 1 TABLET (25 MG TOTAL) BY MOUTH DAILY. Qty: 90 tablet, Refills: 1   Associated Diagnoses: Permanent atrial fibrillation (HCC)    potassium chloride SA (KLOR-CON M) 20 MEQ tablet TAKE 1 TABLET BY MOUTH EVERY DAY Qty: 90 tablet, Refills: 1    Probiotic Product (PROBIOTIC ADVANCED PO) Take 1 tablet by mouth daily.    rosuvastatin (CRESTOR) 10 MG tablet TAKE 1 TABLET BY MOUTH EVERY DAY Qty: 90 tablet, Refills: 1    sertraline (ZOLOFT) 50 MG tablet TAKE 1 TABLET BY MOUTH EVERY DAY Qty: 90 tablet, Refills: 1        History of Present Illness: 73 year old male with prior history of COPD A-fib on AC, HTN, HLD, CKD, CVA, aortic aneurysm, tobacco abuse and alcohol abuse who presented to the ED on 2/24 for worsening of chronic shortness of breath including fevers, congestion and productive cough with phlegm. Per wife also had nausea and loose stools PTA. Wife reports his smoking  and alcohol has significantly decreased in the last few days as he is too weak.  At ED, afebrile, tachypneic Labs remarkable for WBC 11.5, platelets 137, creatinine 1.54, BNP 697, MCV 109.8 Influenza A, B, RSV, SARS-CoV-2 negative Chest Xray with dense left basilar consolidation consistent with pneumonia. Moderate left pleural effusion, partially loculated. Started on ceftriaxone and azithromycin including IV Cardizem  ROS all systems  reviewed including respiratory, negative except as above  Past Medical History:  Diagnosis Date   Abdominal aortic aneurysm (AAA) (HCC)    Alcoholic (HCC)    Atrial fibrillation and flutter (HCC)    CKD (chronic kidney disease) stage 3, GFR 30-59 ml/min (HCC)    CVA (cerebral infarction)    Essential hypertension    Hyperlipidemia    Thoracic aortic aneurysm [facility])    Past Surgical History:  Procedure Laterality Date   BIOPSY  08/17/2020   Procedure: BIOPSY;  Surgeon: Lanelle Bal, DO;  [facility];  Service: Endoscopy;;   BIOPSY  02/21/2021   Procedure: BIOPSY;  Surgeon: Lanelle Bal, DO;  [facility];  Service: Endoscopy;;   COLONOSCOPY  12/2009   Diverticulosis, mild internal hemorrhoids, multiple colon polyps removed (adenomatous). One irregular shaped polyp was tattooed. Next colonoscopy due October 2016.   COLONOSCOPY N/A 11/01/2015   Procedure: COLONOSCOPY;  Surgeon: West Bali, MD;  [facility]: [facility];  Service: Endoscopy;  Laterality: N/A;  100   COLONOSCOPY WITH PROPOFOL N/A 08/17/2020   Procedure: COLONOSCOPY WITH PROPOFOL;  Surgeon: Lanelle Bal, DO;  [facility];  Service: Endoscopy;  Laterality: N/A;  2:15pm   ESOPHAGOGASTRODUODENOSCOPY N/A 10/08/2012   SLF: Moderate erosive gastritis/duodenitis. Biopsies benign.   ESOPHAGOGASTRODUODENOSCOPY N/A 11/01/2015   Procedure: ESOPHAGOGASTRODUODENOSCOPY (EGD);  Surgeon: West Bali, MD;  [facility];  Service: Endoscopy;  Laterality: N/A;   ESOPHAGOGASTRODUODENOSCOPY (EGD) WITH PROPOFOL N/A 02/21/2021   Procedure: ESOPHAGOGASTRODUODENOSCOPY (EGD) WITH PROPOFOL;  Surgeon: Lanelle Bal, DO;  [facility];  Service: Endoscopy;  Laterality: N/A;   POLYPECTOMY  08/17/2020   Procedure: POLYPECTOMY;  Surgeon: Lanelle Bal, DO;  [facility];  Service: Endoscopy;;   Social History   Tobacco Use   Smoking status: Every Day    Current  packs/day: 0.50    Average packs/day: 0.5 packs/day for 7.6 years (3.8 ttl pk-yrs)    Types: Cigarettes    Start date: 10/04/2015   Smokeless tobacco: Never   Tobacco comments:    he would like to but he knows it is hard   Vaping Use   Vaping status: Never Used  Substance Use Topics   Alcohol use: Yes    Alcohol/week: 4.0 standard drinks of alcohol    Types: 4 Standard drinks or equivalent per week    Comment: over 6oz of whiskey a day   Drug use: No    Family History  Problem Relation Age of Onset   Diabetes Mother    Parkinson's disease Father    Diabetes Maternal Grandfather    Ulcers Neg Hx    Colon cancer Neg Hx     Allergies  Allergen Reactions   Ivp Dye [Iodinated Contrast Media] Nausea And Vomiting    Health Maintenance  Topic Date Due   Hepatitis C Screening  Never done   DTaP/Tdap/Td (1 - Tdap) Never done   Zoster Vaccines- Shingrix (1 of 2) Never done   Medicare Annual Wellness (AWV)  05/03/2018   Colonoscopy  08/17/2025   Pneumonia Vaccine 44+ Years old  Completed   INFLUENZA VACCINE  Completed   HPV VACCINES  Aged Out   COVID-19 Vaccine  Discontinued    Observations/Objective: Chronically ill appearing, on nasal cannula, cannot complete full sentences. Wife at bedside.  Assessment and Plan: # GPC bacteremia  - both sets are drawn from same site and very close timing, true bacteremia vs contaminant. Unclear if related to PNA. ID is pending.  - TTE limited study with no vegetations  - Will add Vancomycin for the time being to cover MRSA as well as pulmonary coverage. Avoiding daptomycin as would not cover for lungs.  - 2 sets of blood cx ordered for 2/26 - Monitor CBC, BMP and Vancomycin trough   # Acute Hypoxic Respiratory Failure in the setting of Left basilar PNA, moderate left loculated effusion, COPD - prior h/o left sided thoracentesis back in 02/23/2022, Cx NG and no malignant cells identified.  - On ceftriaxone and azithromycin for CAP  coverage  - MRSA PCR negative  - Fu strep pneumo ag, legionella urine and respiratory culture.  - IR has been consulted for thoracentesis.  - Consider CT chest and Pulmonary eval  I discussed the assessment and treatment plan with the patient. The patient was advised to call back or seek an in-person evaluation if the symptoms worsen or if the condition fails to improve as anticipated.  I have personally spent 80 minutes involved in face-to-face and non-face-to-face activities for this patient on the day of the visit. Professional time spent includes the following activities: Preparing to see the patient (review of tests), Obtaining and/or reviewing separately obtained history (admission/discharge record), Performing a medically appropriate examination and/or evaluation , Ordering medications/tests/procedures, referring and communicating with other health care professionals, Documenting clinical information in the EMR, Independently interpreting results (not separately reported), Communicating results to the patient/family/caregiver, Counseling and educating the patient/family/caregiver and Care coordination (not separately reported).  Electronically signed by:   Assessment and plan are based on conversation with the patient and chart review since NOT seen in person.  Portions of this note may have been created with voice recognition software. While this note has been edited for accuracy, occasional wrong-word or 'sound-a-like' substitutions may have occurred due to the inherent limitations of voice recognition software.   Victoriano Lain, MD [facility] [phone] pager   [phone] cell 05/08/2023, 1:33 PM

## 2023-05-08 NOTE — TOC CM/SW Note (Signed)
 Transition of Care [facility]) - Inpatient Brief Assessment   Patient Details  Name: Anthony Little MRN: [id number] Date of Birth: [birth year]  [facility] CM/SW Contact:    Isabella Bowens, LCSWA Phone Number: 05/08/2023, 9:29 AM   Clinical Narrative:  Transition of Care Department [facility]) has reviewed patient and no TOC needs have been identified at this time. We will continue to monitor patient advancement through interdisciplinary progression rounds. If new patient transition needs arise, please place a TOC consult.  CSW will continue to follow and monitor for O2 needs.    Transition of Care Asessment: Insurance and Status: Insurance coverage has been reviewed Patient has primary care physician: Yes Home environment has been reviewed: Single Family Home Prior level of function:: Independent Prior/Current Home Services: No current home services Social Drivers of Health Review: SDOH reviewed no interventions necessary Readmission risk has been reviewed: Yes Transition of care needs: no transition of care needs at this time

## 2023-05-08 NOTE — Progress Notes (Signed)
 I was alerted that the patient had increased work of breathing.  On evaluation at bedside patient is tachypneic but still maintaining 95% on 3 L nasal cannula.  He has no airway sounds on left lung, right lung remains clear to auscultation.  Repeat chest x-ray reveals complete opacification of left lung.  Patient reports he has had some success with diuretics today.  I have increased his IV Lasix to 60 every 8 hours.  Continue to follow strict I's and O's.  Bladder scan every 4 hours if output slows or concern of retention.  If tachypnea persists and patient appears to have increased work of breathing have instructed RN to place BiPAP.  Patient has planned thoracentesis in the morning.

## 2023-05-08 NOTE — Plan of Care (Signed)

## 2023-05-08 NOTE — Progress Notes (Signed)
 PROGRESS NOTE    [name]  BJY:[id number] DOB: [birth year] DOA: 05/07/2023 PCP: Babs Sciara, MD  Chief Complaint  Patient presents with   Shortness of Breath    Hospital Course:  Anthony Little is 73 y.o. male with hypertension, hyperlipidemia, paroxysmal A-fib, CKD stage IIIa, COPD, AAA, history of tobacco abuse, history of alcohol abuse, who presents to the ED with progressive shortness of breath, subjective fevers.  He endorses a history of thoracentesis 2 years prior. In the ED patient was tachypneic, blood pressure 136/92.  Workup revealed leukocytosis of 11.5, macrocytic anemia, thrombocytopenia to 137, elevated creatinine 1.54, BNP 697.  Chest x-ray showed dense left basilar consolidation consistent with pneumonia as well as moderate left pleural effusions.  He was started on ceftriaxone azithromycin, and IV Cardizem.  Original radiology was consulted for thoracentesis which they are planning on 2/26.  On 2/25 patient's blood cultures began growing gram-positive cocci.  Infectious disease was consulted.  Patient underwent echocardiogram which was a technically difficult study and had poor windows but did not demonstrate profound reduction in EF or vegetations.  Subjective: This morning patient is still endorsing shortness of breath.  He does report he feels somewhat improved since yesterday.  Heart rate is normalizing.   Objective: Vitals:   05/08/23 0645 05/08/23 0700 05/08/23 0724 05/08/23 0800  BP: (!) 151/86 (!) 128/103  (!) 142/84  Pulse: (!) 112     Resp: (!) 28 (!) 27  (!) 31  Temp:   (!) 97.5 F (36.4 C)   TempSrc:   Oral   SpO2: 98% 97%  97%  Weight:      Height:        Intake/Output Summary (Last 24 hours) at 05/08/2023 0865 Last data filed at 05/08/2023 0757 Gross per 24 hour  Intake 414.01 ml  Output 175 ml  Net 239.01 ml   Filed Weights   05/08/23 0050  Weight: 86.5 kg    Examination: General exam: Appears calm and comfortable, NAD   Respiratory system: shallow respirations, poor aeration left lung, right lung without wheeze Cardiovascular system: irregular, nontachycardic Gastrointestinal system: Abdomen is nondistended, soft and nontender.  Neuro: Alert and oriented. No focal neurological deficits. Extremities: Symmetric, expected ROM Skin: No rashes, lesions Psychiatry: Demonstrates appropriate judgement and insight. Mood & affect appropriate for situation.   Assessment & Plan:  Principal Problem:   Sepsis due to pneumonia [facility]) Active Problems:   Mixed hyperlipidemia   COPD (chronic obstructive pulmonary disease) (HCC)   Pleural effusion on left   Paroxysmal atrial fibrillation (HCC)  Sepsis secondary to pneumonia - Sepsis criteria met on arrival: Tachycardia, tachypnea, source: Pneumonia - Has been initiated on ceftriaxone azithromycin, continue -- MRSA PCR neg - Follow blood cultures, current Gram + Cocci - Follow sputum cultures - Urine Legionella and strep pneumo antigen: - Procalcitonin: 2.6 - Continue supportive treatment with Mucinex, Tylenol, I-S, FV  Left pleural effusion - Has required thoracentesis in the past - IR has been consulted for thoracentesis, will do tomorrow in house - In the interim we will trial IV diuretics, follow strict Ins and Outs -- BNP 697 - Echocardiogram technically difficult, some LVH but no reported EF dysfunction - Most recent echocardiogram available for review December 2023: LVEF 55 to 60%, mild concentric left ventricular hypertrophy, left ventricular diastolic parameters were indeterminate at that time. - Patient does take Eliquis outpatient, last dose 2/21 per patient  Gram + Cocci Bacteremia -- Blood cultures currently grown gram + Cocci --  Patient is already on ceftriaxone for pneumonia treatment as above - Echocardiogram without vegetation. May need TEE, will consult cards when respiratory status more stable and pt more likely to tolerate the procedure -  Follow for ID and sensitivities - Consult Infectious Disease  Paroxysmal A-fib, with RVR on arrival - Currently on IV Cardizem, improving -- On eliquis outpt, holding for thora - Will transition to p.o. meds when stable  Macrocytic Anemia -- Folate deficient. Start supplementation  Thrombocytopenia - May be reactive versus chronic due to history of alcohol abuse - Platelets 137 on arrival, trend CBC  COPD, not in exacerbation - Continue home dose albuterol  CKD stage III A - Appears baseline creatinine is around 1.5, - Currently creatinine 1.3 - Avoid nephrotoxic meds - Renally dose with a creatinine clearance 52  Hyperlipidemia - Continue statin  Tobacco abuse - quit 2 months ago  Alcohol abuse - Previously daily drinker, last drink 2 months ago     DVT prophylaxis: Hold AC for now in setting of thoracentesis. SCDs   Code Status: Full Code Family Communication: Discussed directly with patient Disposition:  Inpatient still hospitalized for work up, IV abx, IV diltiazem, IV diuresis, supplemental O2. Will engage PT/OT when more stable to participate and begin discharge planning at that time.  Consultants:  Infectious Disease  Procedures:    Antimicrobials:  Anti-infectives (From admission, onward)    Start     Dose/Rate Route Frequency Ordered Stop   05/08/23 1000  cefTRIAXone (ROCEPHIN) 1 g in sodium chloride 0.9 % 100 mL IVPB        1 g 200 mL/hr over 30 Minutes Intravenous Every 24 hours 05/08/23 0646     05/08/23 1000  azithromycin (ZITHROMAX) 500 mg in sodium chloride 0.9 % 250 mL IVPB        500 mg 250 mL/hr over 60 Minutes Intravenous Every 24 hours 05/08/23 0646 05/12/23 0959   05/07/23 2230  cefTRIAXone (ROCEPHIN) 2 g in sodium chloride 0.9 % 100 mL IVPB        2 g 200 mL/hr over 30 Minutes Intravenous Once 05/07/23 2220 05/07/23 2321   05/07/23 2230  azithromycin (ZITHROMAX) 500 mg in sodium chloride 0.9 % 250 mL IVPB        500 mg 250 mL/hr over  60 Minutes Intravenous  Once 05/07/23 2220 05/08/23 0005       Data Reviewed: I have personally reviewed following labs and imaging studies CBC: Recent Labs  Lab 05/07/23 2012 05/08/23 0548  WBC 11.5* 6.2  HGB 13.1 11.0*  HCT 40.2 33.7*  MCV 109.8* 104.7*  PLT 137* 111*   Basic Metabolic Panel: Recent Labs  Lab 05/07/23 2012 05/08/23 0548  NA 134* 135  K 4.6 3.7  CL 99 102  CO2 19* 22  GLUCOSE 84 85  BUN 26* 29*  CREATININE 1.54* 1.34*  CALCIUM 8.7* 8.1*  MG  --  1.4*  PHOS  --  3.7   GFR: Estimated Creatinine Clearance: 52.2 mL/min (A) (by C-G formula based on SCr of 1.34 mg/dL (H)). Liver Function Tests: Recent Labs  Lab 05/08/23 0548  AST 25  ALT 12  ALKPHOS 77  BILITOT 1.5*  PROT 6.2*  ALBUMIN 2.3*   CBG: No results for input(s): "GLUCAP" in the last 168 hours.  Recent Results (from the past 240 hours)  Resp panel by RT-PCR (RSV, Flu A&B, Covid) Anterior Nasal Swab     Status: None   Collection Time: 05/07/23  7:41  PM   Specimen: Anterior Nasal Swab  Result Value Ref Range Status   SARS Coronavirus 2 by RT PCR NEGATIVE NEGATIVE Final    Comment: (NOTE) SARS-CoV-2 target nucleic acids are NOT DETECTED.  The SARS-CoV-2 RNA is generally detectable in upper respiratory specimens during the acute phase of infection. The lowest concentration of SARS-CoV-2 viral copies this assay can detect is 138 copies/mL. A negative result does not preclude SARS-Cov-2 infection and should not be used as the sole basis for treatment or other patient management decisions. A negative result may occur with  improper specimen collection/handling, submission of specimen other than nasopharyngeal swab, presence of viral mutation(s) within the areas targeted by this assay, and inadequate number of viral copies(<138 copies/mL). A negative result must be combined with clinical observations, patient history, and epidemiological information. The expected result is  Negative.  Fact Sheet for Patients:  BloggerCourse.com  Fact Sheet for Healthcare Providers:  SeriousBroker.it  This test is no t yet approved or cleared by the [place] FDA and  has been authorized for detection and/or diagnosis of SARS-CoV-2 by FDA under an Emergency Use Authorization (EUA). This EUA will remain  in effect (meaning this test can be used) for the duration of the COVID-19 declaration under Section 564(b)(1) of the Act, 21 U.S.C.section 360bbb-3(b)(1), unless the authorization is terminated  or revoked sooner.       Influenza A by PCR NEGATIVE NEGATIVE Final   Influenza B by PCR NEGATIVE NEGATIVE Final    Comment: (NOTE) The Xpert Xpress SARS-CoV-2/FLU/RSV plus assay is intended as an aid in the diagnosis of influenza from Nasopharyngeal swab specimens and should not be used as a sole basis for treatment. Nasal washings and aspirates are unacceptable for Xpert Xpress SARS-CoV-2/FLU/RSV testing.  Fact Sheet for Patients: BloggerCourse.com  Fact Sheet for Healthcare Providers: SeriousBroker.it  This test is not yet approved or cleared by the [place] FDA and has been authorized for detection and/or diagnosis of SARS-CoV-2 by FDA under an Emergency Use Authorization (EUA). This EUA will remain in effect (meaning this test can be used) for the duration of the COVID-19 declaration under Section 564(b)(1) of the Act, 21 U.S.C. section 360bbb-3(b)(1), unless the authorization is terminated or revoked.     Resp Syncytial Virus by PCR NEGATIVE NEGATIVE Final    Comment: (NOTE) Fact Sheet for Patients: BloggerCourse.com  Fact Sheet for Healthcare Providers: SeriousBroker.it  This test is not yet approved or cleared by the [place] FDA and has been authorized for detection and/or diagnosis of  SARS-CoV-2 by FDA under an Emergency Use Authorization (EUA). This EUA will remain in effect (meaning this test can be used) for the duration of the COVID-19 declaration under Section 564(b)(1) of the Act, 21 U.S.C. section 360bbb-3(b)(1), unless the authorization is terminated or revoked.  Performed at [facility], [address] [place], [place] [address]   Blood Culture (routine x 2)     Status: None (Preliminary result)   Collection Time: 05/07/23 10:40 PM   Specimen: BLOOD  Result Value Ref Range Status   Specimen Description BLOOD BLOOD LEFT ARM  Final   Special Requests   Final    BOTTLES DRAWN AEROBIC AND ANAEROBIC Blood Culture adequate volume   Culture   Final    NO GROWTH < 12 HOURS Performed at  [facility], [address], [place], [place] [address]    Report Status PENDING  Incomplete  Blood Culture (routine x 2)     Status: None (Preliminary  result)   Collection Time: 05/07/23 10:42 PM   Specimen: BLOOD  Result Value Ref Range Status   Specimen Description BLOOD BLOOD LEFT ARM  Final   Special Requests   Final    BOTTLES DRAWN AEROBIC AND ANAEROBIC Blood Culture adequate volume   Culture   Final    NO GROWTH < 12 HOURS Performed at [facility], [address], [place], [place] [address]    Report Status PENDING  Incomplete  MRSA Next Gen by PCR, Nasal     Status: None   Collection Time: 05/08/23 12:40 AM   Specimen: Nasal Mucosa; Nasal Swab  Result Value Ref Range Status   MRSA by PCR Next Gen NOT DETECTED NOT DETECTED Final    Comment: (NOTE) The GeneXpert MRSA Assay (FDA approved for NASAL specimens only), is one component of a comprehensive MRSA colonization surveillance program. It is not intended to diagnose MRSA infection nor to guide or monitor treatment for MRSA infections. Test performance is not FDA approved in patients less than 16 years old. Performed at [facility], [address], [place], [place] [address]      Radiology Studies: DG Chest  2 View Result Date: 05/07/2023 CLINICAL DATA:  Short of breath, progressive weakness EXAM: CHEST - 2 VIEW COMPARISON:  12/28/2022 FINDINGS: Frontal and lateral views of the chest demonstrate an unremarkable cardiac silhouette. There is dense left basilar consolidation, with moderate left pleural effusion which is partially loculated. Right chest is clear. No pneumothorax. No acute bony abnormalities. IMPRESSION: 1. Dense left basilar consolidation consistent with pneumonia. 2. Moderate left pleural effusion, partially loculated. Electronically Signed   By: Sharlet Salina M.D.   On: 05/07/2023 21:41    Scheduled Meds:  Chlorhexidine Gluconate Cloth  6 each Topical Q0600   dextromethorphan-guaiFENesin  1 tablet Oral BID   Continuous Infusions:  azithromycin     cefTRIAXone (ROCEPHIN)  IV     diltiazem (CARDIZEM) infusion 15 mg/hr (05/08/23 0430)   lactated ringers Stopped (05/07/23 2302)     LOS: 1 day   CRITICAL CARE Performed by: Debarah Crape   Total critical care time: 70 minutes  Critical care time was exclusive of separately billable procedures and treating other patients. Critical care was necessary to treat or prevent imminent or life-threatening deterioration. Critical care was time spent personally by me on the following activities: development of treatment plan with patient and/or surrogate as well as nursing, discussions with consultants, evaluation of patient's response to treatment, examination of patient, obtaining history from patient or surrogate, ordering and performing treatments and interventions, ordering and review of laboratory studies, ordering and review of radiographic studies, pulse oximetry and re-evaluation of patient's condition.   Debarah Crape, DO [facility]  To contact the attending physician between 7A-7P please use Epic Chat. To contact the covering physician during after hours 7P-7A, please review Amion.   05/08/2023, 9:04 AM   *This  document has been created with the assistance of dictation software. Please excuse typographical errors. *

## 2023-05-08 NOTE — Progress Notes (Signed)
 Pharmacy Antibiotic Note  Anthony Little is a 73 y.o. male admitted on 05/07/2023 with pneumonia and left pleural effusion. Now with blood cultures showing gram positive cocci in both sets (drawn from the same site). Biofire pending. Pharmacy has been consulted for vancomycin dosing. SCr down to 1.34 today with CrCl ~ 50 ml/min.   Plan: Vancomycin 1750 mg x 1 then 1500 mg every 24 hours Predicted AUC 504 ( Goal: 400-550) F/u BCID, cultures, renal function   Height: 5\' 10"  (177.8 cm) Weight: 86.5 kg (190 lb 11.2 oz) IBW/kg (Calculated) : 73  Temp (24hrs), Avg:97.5 F (36.4 C), Min:97.4 F (36.3 C), Max:97.7 F (36.5 C)  Recent Labs  Lab 05/07/23 2012 05/07/23 2242 05/08/23 0548  WBC 11.5*  --  6.2  CREATININE 1.54*  --  1.34*  LATICACIDVEN  --  1.5  --     Estimated Creatinine Clearance: 52.2 mL/min (A) (by C-G formula based on SCr of 1.34 mg/dL (H)).    Allergies  Allergen Reactions   Ivp Dye [Iodinated Contrast Media] Nausea And Vomiting      Thank you for allowing pharmacy to be a part of this patient's care.  Sharin Mons, PharmD, BCPS, BCIDP Infectious Diseases Clinical Pharmacist Phone: [phone] 05/08/2023 3:41 PM

## 2023-05-08 NOTE — Progress Notes (Signed)
*  PRELIMINARY RESULTS* Echocardiogram 2D Echocardiogram has been performed.   Stacey Drain 05/08/2023, 10:30 AM

## 2023-05-08 NOTE — ED Notes (Signed)
 ED TO INPATIENT HANDOFF REPORT  ED Nurse Name and Phone #: Ephriam Knuckles Medic  S Name/Age/Gender Anthony Little 73 y.o. male Room/Bed: APA03/APA03  Code Status   Code Status: Prior  Home/SNF/Other Home Patient oriented to: self, place, time, and situation Is this baseline? Yes   Triage Complete: Triage complete  Chief Complaint Sepsis due to pneumonia (HCC) [J18.9, A41.9]  Triage Note Pt reports a "couple year" hx of shob and weakness but it has been getting worse the past month.  Called physician today and were told to come to the ED.  Congested cough.  Brown sputum.  Febrile today at home.  Hx of COPD.  No home O2 requirement.  Hx of afib.  On eliquis.  Wife reports he has been too weak to do anything other than go to the restroom and back.    Allergies Allergies  Allergen Reactions   Ivp Dye [Iodinated Contrast Media] Nausea And Vomiting    Level of Care/Admitting Diagnosis ED Disposition     ED Disposition  Admit   Condition  --   Comment  Hospital Area: [facility] [[id number]]  Level of Care: Stepdown [14]  Covid Evaluation: Asymptomatic - no recent exposure (last 10 days) testing not required  Diagnosis: Sepsis due to pneumonia [facility] [place]) [[id number]]  Admitting Physician: Frankey Shown [[id number]]  Attending Physician: Frankey Shown [[id number]]  Certification:: I certify this patient will need inpatient services for at least 2 midnights  Expected Medical Readiness: 05/10/2023          B Medical/Surgery History Past Medical History:  Diagnosis Date   Abdominal aortic aneurysm (AAA) (HCC)    Alcoholic (HCC)    Atrial fibrillation and flutter (HCC)    CKD (chronic kidney disease) stage 3, GFR 30-59 ml/min (HCC)    CVA (cerebral infarction)    Essential hypertension    Hyperlipidemia    Thoracic aortic aneurysm [facility])    Past Surgical History:  Procedure Laterality Date   BIOPSY  08/17/2020   Procedure: BIOPSY;  Surgeon: Lanelle Bal, DO;   [facility];  Service: Endoscopy;;   BIOPSY  02/21/2021   Procedure: BIOPSY;  Surgeon: Lanelle Bal, DO;  [facility];  Service: Endoscopy;;   COLONOSCOPY  12/2009   Diverticulosis, mild internal hemorrhoids, multiple colon polyps removed (adenomatous). One irregular shaped polyp was tattooed. Next colonoscopy due October 2016.   COLONOSCOPY N/A 11/01/2015   Procedure: COLONOSCOPY;  Surgeon: West Bali, MD;  [facility];  Service: Endoscopy;  Laterality: N/A;  100   COLONOSCOPY WITH PROPOFOL N/A 08/17/2020   Procedure: COLONOSCOPY WITH PROPOFOL;  Surgeon: Lanelle Bal, DO;  [facility];  Service: Endoscopy;  Laterality: N/A;  2:15pm   ESOPHAGOGASTRODUODENOSCOPY N/A 10/08/2012   SLF: Moderate erosive gastritis/duodenitis. Biopsies benign.   ESOPHAGOGASTRODUODENOSCOPY N/A 11/01/2015   Procedure: ESOPHAGOGASTRODUODENOSCOPY (EGD);  Surgeon: West Bali, MD;  [facility];  Service: Endoscopy;  Laterality: N/A;   ESOPHAGOGASTRODUODENOSCOPY (EGD) WITH PROPOFOL N/A 02/21/2021   Procedure: ESOPHAGOGASTRODUODENOSCOPY (EGD) WITH PROPOFOL;  Surgeon: Lanelle Bal, DO;  [facility];  Service: Endoscopy;  Laterality: N/A;   POLYPECTOMY  08/17/2020   Procedure: POLYPECTOMY;  Surgeon: Lanelle Bal, DO;  [facility];  Service: Endoscopy;;     A IV Location/Drains/Wounds Patient Lines/Drains/Airways Status     Active Line/Drains/Airways     Name Placement date Placement time Site Days   Peripheral IV 05/07/23 20 G  Anterior;Right Forearm 05/07/23  2028  Forearm  1   Peripheral IV 05/07/23 20 G Left;Posterior Forearm 05/07/23  2246  Forearm  1            Intake/Output Last 24 hours  Intake/Output Summary (Last 24 hours) at 05/08/2023 0019 Last data filed at 05/08/2023 0005 Gross per 24 hour  Intake 350 ml  Output --  Net 350 ml    Labs/Imaging Results for orders placed or performed during  the hospital encounter of 05/07/23 (from the past 48 hours)  Resp panel by RT-PCR (RSV, Flu A&B, Covid) Anterior Nasal Swab     Status: None   Collection Time: 05/07/23  7:41 PM   Specimen: Anterior Nasal Swab  Result Value Ref Range   SARS Coronavirus 2 by RT PCR NEGATIVE NEGATIVE    Comment: (NOTE) SARS-CoV-2 target nucleic acids are NOT DETECTED.  The SARS-CoV-2 RNA is generally detectable in upper respiratory specimens during the acute phase of infection. The lowest concentration of SARS-CoV-2 viral copies this assay can detect is 138 copies/mL. A negative result does not preclude SARS-Cov-2 infection and should not be used as the sole basis for treatment or other patient management decisions. A negative result may occur with  improper specimen collection/handling, submission of specimen other than nasopharyngeal swab, presence of viral mutation(s) within the areas targeted by this assay, and inadequate number of viral copies(<138 copies/mL). A negative result must be combined with clinical observations, patient history, and epidemiological information. The expected result is Negative.  Fact Sheet for Patients:  BloggerCourse.com  Fact Sheet for Healthcare Providers:  SeriousBroker.it  This test is no t yet approved or cleared by the [place] FDA and  has been authorized for detection and/or diagnosis of SARS-CoV-2 by FDA under an Emergency Use Authorization (EUA). This EUA will remain  in effect (meaning this test can be used) for the duration of the COVID-19 declaration under Section 564(b)(1) of the Act, 21 U.S.C.section 360bbb-3(b)(1), unless the authorization is terminated  or revoked sooner.       Influenza A by PCR NEGATIVE NEGATIVE   Influenza B by PCR NEGATIVE NEGATIVE    Comment: (NOTE) The Xpert Xpress SARS-CoV-2/FLU/RSV plus assay is intended as an aid in the diagnosis of influenza from Nasopharyngeal swab  specimens and should not be used as a sole basis for treatment. Nasal washings and aspirates are unacceptable for Xpert Xpress SARS-CoV-2/FLU/RSV testing.  Fact Sheet for Patients: BloggerCourse.com  Fact Sheet for Healthcare Providers: SeriousBroker.it  This test is not yet approved or cleared by the [place] FDA and has been authorized for detection and/or diagnosis of SARS-CoV-2 by FDA under an Emergency Use Authorization (EUA). This EUA will remain in effect (meaning this test can be used) for the duration of the COVID-19 declaration under Section 564(b)(1) of the Act, 21 U.S.C. section 360bbb-3(b)(1), unless the authorization is terminated or revoked.     Resp Syncytial Virus by PCR NEGATIVE NEGATIVE    Comment: (NOTE) Fact Sheet for Patients: BloggerCourse.com  Fact Sheet for Healthcare Providers: SeriousBroker.it  This test is not yet approved or cleared by the [place] FDA and has been authorized for detection and/or diagnosis of SARS-CoV-2 by FDA under an Emergency Use Authorization (EUA). This EUA will remain in effect (meaning this test can be used) for the duration of the COVID-19 declaration under Section 564(b)(1) of the Act, 21 U.S.C. section 360bbb-3(b)(1), unless the authorization is terminated or revoked.  Performed at [facility], [address] [place], [place] [address]   Basic metabolic panel     Status: Abnormal   Collection Time: 05/07/23  8:12 PM  Result Value Ref Range   Sodium 134 (L) 135 - 145 mmol/L   Potassium 4.6 3.5 - 5.1 mmol/L    Comment: HEMOLYSIS AT THIS LEVEL MAY AFFECT RESULT HEMOLYSIS AT THIS LEVEL MAY AFFECT RESULT    Chloride 99 98 - 111 mmol/L   CO2 19 (L) 22 - 32 mmol/L   Glucose, Bld 84 70 - 99 mg/dL    Comment: Glucose reference range applies only to samples taken after fasting for at least 8 hours.   BUN 26 (H) 8 -  23 mg/dL   Creatinine, Ser 9.56 (H) 0.61 - 1.24 mg/dL   Calcium 8.7 (L) 8.9 - 10.3 mg/dL   GFR, Estimated 48 (L) >60 mL/min    Comment: (NOTE) Calculated using the CKD-EPI Creatinine Equation (2021)    Anion gap 16 (H) 5 - 15    Comment: Performed at [facility], [address] [place], [place] [address]  CBC     Status: Abnormal   Collection Time: 05/07/23  8:12 PM  Result Value Ref Range   WBC 11.5 (H) 4.0 - 10.5 K/uL   RBC 3.66 (L) 4.22 - 5.81 MIL/uL   Hemoglobin 13.1 13.0 - 17.0 g/dL   HCT 43.3 29.5 - 18.8 %   MCV 109.8 (H) 80.0 - 100.0 fL   MCH 35.8 (H) 26.0 - 34.0 pg   MCHC 32.6 30.0 - 36.0 g/dL   RDW 41.6 60.6 - 30.1 %   Platelets 137 (L) 150 - 400 K/uL   nRBC 0.0 0.0 - 0.2 %    Comment: Performed at [facility], [address], [place], [place] [address]  Troponin I (High Sensitivity)     Status: None   Collection Time: 05/07/23  8:12 PM  Result Value Ref Range   Troponin I (High Sensitivity) 10 <18 ng/L    Comment: (NOTE) Elevated high sensitivity troponin I (hsTnI) values and significant  changes across serial measurements may suggest ACS but many other  chronic and acute conditions are known to elevate hsTnI results.  Refer to the "Links" section for chest pain algorithms and additional  guidance. Performed at [facility], [address] [place], [place] [address]   Protime-INR     Status: Abnormal   Collection Time: 05/07/23  9:12 PM  Result Value Ref Range   Prothrombin Time 15.6 (H) 11.4 - 15.2 seconds   INR 1.2 0.8 - 1.2    Comment: (NOTE) INR goal varies based on device and disease states. Performed at [facility], [address] [place], [place] [address]   Brain natriuretic peptide     Status: Abnormal   Collection Time: 05/07/23  9:12 PM  Result Value Ref Range   B Natriuretic Peptide 697.0 (H) 0.0 - 100.0 pg/mL    Comment: Performed at [facility], [address], [place], [place] [address]  Troponin I (High Sensitivity)     Status: None    Collection Time: 05/07/23  9:12 PM  Result Value Ref Range   Troponin I (High Sensitivity) 9 <18 ng/L    Comment: (NOTE) Elevated high sensitivity troponin I (hsTnI) values and significant  changes across serial measurements may suggest ACS but many other  chronic and acute conditions are known to elevate hsTnI results.  Refer to the "Links" section for chest pain algorithms and additional  guidance. Performed at [facility], [address], [place], [place] [address]   Blood Culture (routine x 2)     Status: None (Preliminary result)   Collection Time: 05/07/23 10:40 PM   Specimen: Blood  Result Value Ref Range   Specimen Description BLOOD BLOOD LEFT ARM    Special Requests      BOTTLES DRAWN AEROBIC AND ANAEROBIC Blood Culture adequate volume Performed at [facility], [address], [place], [place] [address]    Culture PENDING    Report Status PENDING   Lactic acid, plasma     Status: None   Collection Time: 05/07/23 10:42 PM  Result Value Ref Range   Lactic Acid, Venous 1.5 0.5 - 1.9 mmol/L    Comment: Performed at [facility], [address] [place], [place] [address]  Blood Culture (routine x 2)     Status: None (Preliminary result)   Collection Time: 05/07/23 10:42 PM   Specimen: Blood  Result Value Ref Range   Specimen Description BLOOD BLOOD LEFT ARM    Special Requests      BOTTLES DRAWN AEROBIC AND ANAEROBIC Blood Culture adequate volume Performed at [facility], [address] [place], [place] [address]    Culture PENDING    Report Status PENDING    DG Chest 2 View Result Date: 05/07/2023 CLINICAL DATA:  Short of breath, progressive weakness EXAM: CHEST - 2 VIEW COMPARISON:  12/28/2022 FINDINGS: Frontal and lateral views of the chest demonstrate an unremarkable cardiac silhouette. There is dense left basilar consolidation, with moderate left pleural effusion which is partially loculated. Right chest is clear. No pneumothorax. No acute bony abnormalities.  IMPRESSION: 1. Dense left basilar consolidation consistent with pneumonia. 2. Moderate left pleural effusion, partially loculated. Electronically Signed   By: Sharlet Salina M.D.   On: 05/07/2023 21:41    Pending Labs Unresulted Labs (From admission, onward)     Start     Ordered   05/08/23 0016  MRSA Next Gen by PCR, Nasal  Once,   R        05/08/23 0016            Vitals/Pain Today's Vitals   05/07/23 2145 05/07/23 2230 05/07/23 2300 05/08/23 0000  BP: (!) 154/93 (!) 151/81 (!) 152/98 139/88  Pulse: (!) 146 (!) 129 (!) 136 (!) 121  Resp: (!) 30 (!) 34 (!) 31 (!) 27  Temp:      TempSrc:      SpO2: 96% 96% 95% 93%  PainSc:        Isolation Precautions No active isolations  Medications Medications  diltiazem (CARDIZEM) 1 mg/mL load via infusion 20 mg (20 mg Intravenous Bolus from Bag 05/07/23 2105)    And  diltiazem (CARDIZEM) 125 mg in dextrose 5% 125 mL (1 mg/mL) infusion (5 mg/hr Intravenous New Bag/Given 05/07/23 2102)  lactated ringers infusion (0 mLs Intravenous Hold 05/07/23 2302)  Chlorhexidine Gluconate Cloth 2 % PADS 6 each (has no administration in time range)  cefTRIAXone (ROCEPHIN) 2 g in sodium chloride 0.9 % 100 mL IVPB (0 g Intravenous Stopped 05/07/23 2321)  azithromycin (ZITHROMAX) 500 mg in sodium chloride 0.9 % 250 mL IVPB (0 mg Intravenous Stopped 05/08/23 0005)    Mobility walks     Focused Assessments Cardiac Assessment Handoff:    No results found for: "CKTOTAL", "CKMB", "CKMBINDEX", "TROPONINI" Lab Results  Component Value Date   DDIMER 0.58 (H) 10/28/2018   Does the Patient currently have chest pain? No    R Recommendations: See  Admitting Provider Note  Report given to:   Additional Notes:   3lpm nasal cannula

## 2023-05-08 NOTE — Progress Notes (Signed)
 Date and time results received: 05/08/23 1050 (use smartphrase ".now" to insert current time)  Test: Blood Cultures Critical Value: Anaerobic bottle positive: gram positive cocci  Name of Provider Notified: Dr. Rennis Chris  Orders Received? Or Actions Taken?: MD notified. No new orders at this time.

## 2023-05-08 NOTE — Plan of Care (Signed)

## 2023-05-09 ENCOUNTER — Encounter (HOSPITAL_COMMUNITY): Payer: Self-pay | Admitting: Internal Medicine

## 2023-05-09 ENCOUNTER — Telehealth: Payer: Self-pay | Admitting: Family Medicine

## 2023-05-09 ENCOUNTER — Ambulatory Visit: Payer: Medicare Other | Admitting: Family Medicine

## 2023-05-09 ENCOUNTER — Inpatient Hospital Stay (HOSPITAL_COMMUNITY): Payer: Medicare Other

## 2023-05-09 DIAGNOSIS — E8721 Acute metabolic acidosis: Secondary | ICD-10-CM

## 2023-05-09 DIAGNOSIS — J90 Pleural effusion, not elsewhere classified: Secondary | ICD-10-CM | POA: Diagnosis not present

## 2023-05-09 DIAGNOSIS — E876 Hypokalemia: Secondary | ICD-10-CM

## 2023-05-09 DIAGNOSIS — D649 Anemia, unspecified: Secondary | ICD-10-CM

## 2023-05-09 DIAGNOSIS — J9601 Acute respiratory failure with hypoxia: Secondary | ICD-10-CM | POA: Diagnosis not present

## 2023-05-09 DIAGNOSIS — I4891 Unspecified atrial fibrillation: Secondary | ICD-10-CM

## 2023-05-09 DIAGNOSIS — J860 Pyothorax with fistula: Secondary | ICD-10-CM | POA: Diagnosis not present

## 2023-05-09 DIAGNOSIS — J869 Pyothorax without fistula: Secondary | ICD-10-CM | POA: Diagnosis present

## 2023-05-09 DIAGNOSIS — A419 Sepsis, unspecified organism: Secondary | ICD-10-CM | POA: Diagnosis not present

## 2023-05-09 DIAGNOSIS — J13 Pneumonia due to Streptococcus pneumoniae: Secondary | ICD-10-CM | POA: Diagnosis not present

## 2023-05-09 DIAGNOSIS — J189 Pneumonia, unspecified organism: Secondary | ICD-10-CM | POA: Diagnosis not present

## 2023-05-09 DIAGNOSIS — E871 Hypo-osmolality and hyponatremia: Secondary | ICD-10-CM

## 2023-05-09 DIAGNOSIS — E782 Mixed hyperlipidemia: Secondary | ICD-10-CM | POA: Diagnosis not present

## 2023-05-09 DIAGNOSIS — N1831 Chronic kidney disease, stage 3a: Secondary | ICD-10-CM

## 2023-05-09 DIAGNOSIS — I480 Paroxysmal atrial fibrillation: Secondary | ICD-10-CM | POA: Diagnosis not present

## 2023-05-09 LAB — GLUCOSE, PLEURAL OR PERITONEAL FLUID: Glucose, Fluid: <20 mg/dL

## 2023-05-09 LAB — PROCALCITONIN: Procalcitonin: 2.69 ng/mL

## 2023-05-09 LAB — POCT I-STAT 7, (LYTES, BLD GAS, ICA,H+H)
Acid-base deficit: 1 mmol/L (ref 0.0–2.0)
Bicarbonate: 23.2 mmol/L (ref 20.0–28.0)
Calcium, Ion: 1.12 mmol/L — ABNORMAL LOW (ref 1.15–1.40)
HCT: 35 % — ABNORMAL LOW (ref 39.0–52.0)
Hemoglobin: 11.9 g/dL — ABNORMAL LOW (ref 13.0–17.0)
O2 Saturation: 89 %
Potassium: 3.1 mmol/L — ABNORMAL LOW (ref 3.5–5.1)
Sodium: 132 mmol/L — ABNORMAL LOW (ref 135–145)
TCO2: 24 mmol/L (ref 22–32)
pCO2 arterial: 36 mm[Hg] (ref 32–48)
pH, Arterial: 7.418 (ref 7.35–7.45)
pO2, Arterial: 54 mm[Hg] — ABNORMAL LOW (ref 83–108)

## 2023-05-09 LAB — BASIC METABOLIC PANEL
Anion gap: 14 (ref 5–15)
BUN: 33 mg/dL — ABNORMAL HIGH (ref 8–23)
CO2: 24 mmol/L (ref 22–32)
Calcium: 8.3 mg/dL — ABNORMAL LOW (ref 8.9–10.3)
Chloride: 95 mmol/L — ABNORMAL LOW (ref 98–111)
Creatinine, Ser: 1.6 mg/dL — ABNORMAL HIGH (ref 0.61–1.24)
GFR, Estimated: 46 mL/min — ABNORMAL LOW (ref >60)
Glucose, Bld: 101 mg/dL — ABNORMAL HIGH (ref 70–99)
Potassium: 3.3 mmol/L — ABNORMAL LOW (ref 3.5–5.1)
Sodium: 133 mmol/L — ABNORMAL LOW (ref 135–145)

## 2023-05-09 LAB — COMPREHENSIVE METABOLIC PANEL
ALT: 18 U/L (ref 0–44)
AST: 41 U/L (ref 15–41)
Albumin: 2.3 g/dL — ABNORMAL LOW (ref 3.5–5.0)
Alkaline Phosphatase: 90 U/L (ref 38–126)
Anion gap: 16 — ABNORMAL HIGH (ref 5–15)
BUN: 34 mg/dL — ABNORMAL HIGH (ref 8–23)
CO2: 22 mmol/L (ref 22–32)
Calcium: 8.5 mg/dL — ABNORMAL LOW (ref 8.9–10.3)
Chloride: 95 mmol/L — ABNORMAL LOW (ref 98–111)
Creatinine, Ser: 1.59 mg/dL — ABNORMAL HIGH (ref 0.61–1.24)
GFR, Estimated: 46 mL/min — ABNORMAL LOW (ref >60)
Glucose, Bld: 111 mg/dL — ABNORMAL HIGH (ref 70–99)
Potassium: 3.2 mmol/L — ABNORMAL LOW (ref 3.5–5.1)
Sodium: 133 mmol/L — ABNORMAL LOW (ref 135–145)
Total Bilirubin: 1.3 mg/dL — ABNORMAL HIGH (ref 0.0–1.2)
Total Protein: 6.7 g/dL (ref 6.5–8.1)

## 2023-05-09 LAB — CBC
HCT: 34.2 % — ABNORMAL LOW (ref 39.0–52.0)
Hemoglobin: 11.5 g/dL — ABNORMAL LOW (ref 13.0–17.0)
MCH: 34.7 pg — ABNORMAL HIGH (ref 26.0–34.0)
MCHC: 33.6 g/dL (ref 30.0–36.0)
MCV: 103.3 fL — ABNORMAL HIGH (ref 80.0–100.0)
Platelets: 141 10*3/uL — ABNORMAL LOW (ref 150–400)
RBC: 3.31 MIL/uL — ABNORMAL LOW (ref 4.22–5.81)
RDW: 13.2 % (ref 11.5–15.5)
WBC: 4.9 10*3/uL (ref 4.0–10.5)
nRBC: 0 % (ref 0.0–0.2)

## 2023-05-09 LAB — MAGNESIUM: Magnesium: 1.8 mg/dL (ref 1.7–2.4)

## 2023-05-09 LAB — GRAM STAIN

## 2023-05-09 LAB — STREP PNEUMONIAE URINARY ANTIGEN: Strep Pneumo Urinary Antigen: NEGATIVE

## 2023-05-09 LAB — PROTEIN, PLEURAL OR PERITONEAL FLUID: Total protein, fluid: 3.1 g/dL

## 2023-05-09 LAB — LACTIC ACID, PLASMA
Lactic Acid, Venous: 1.4 mmol/L (ref 0.5–1.9)
Lactic Acid, Venous: 1.4 mmol/L (ref 0.5–1.9)

## 2023-05-09 LAB — ALBUMIN, PLEURAL OR PERITONEAL FLUID: Albumin, Fluid: <1.5 g/dL

## 2023-05-09 LAB — LACTATE DEHYDROGENASE, PLEURAL OR PERITONEAL FLUID: LD, Fluid: 645 U/L — ABNORMAL HIGH (ref 3–23)

## 2023-05-09 MED ORDER — REVEFENACIN 175 MCG/3ML IN SOLN
175.0000 ug | Freq: Every day | RESPIRATORY_TRACT | Status: DC
  Administered 2023-05-10 – 2023-05-12 (×3): 175 ug via RESPIRATORY_TRACT
  Filled 2023-05-09 (×3): qty 3

## 2023-05-09 MED ORDER — FENTANYL CITRATE PF 50 MCG/ML IJ SOSY: 50.0000 ug | PREFILLED_SYRINGE | Freq: Once | INTRAMUSCULAR | Status: AC

## 2023-05-09 MED ORDER — FUROSEMIDE 10 MG/ML IJ SOLN: 40.0000 mg | Freq: Every day | INTRAMUSCULAR | Status: DC

## 2023-05-09 MED ORDER — SODIUM CHLORIDE 0.9 % IV SOLN
2.0000 g | INTRAVENOUS | Status: DC
  Administered 2023-05-09 – 2023-05-10 (×2): 2 g via INTRAVENOUS
  Filled 2023-05-09 (×2): qty 20

## 2023-05-09 MED ORDER — ORAL CARE MOUTH RINSE: 15.0000 mL | OROMUCOSAL | Status: DC | PRN

## 2023-05-09 MED ORDER — SODIUM CHLORIDE 0.9% FLUSH
10.0000 mL | Freq: Three times a day (TID) | INTRAVENOUS | Status: DC
  Administered 2023-05-09 – 2023-05-11 (×5): 10 mL via INTRAPLEURAL

## 2023-05-09 MED ORDER — ARFORMOTEROL TARTRATE 15 MCG/2ML IN NEBU
15.0000 ug | INHALATION_SOLUTION | Freq: Two times a day (BID) | RESPIRATORY_TRACT | Status: DC
  Administered 2023-05-09 – 2023-05-12 (×6): 15 ug via RESPIRATORY_TRACT
  Filled 2023-05-09 (×6): qty 2

## 2023-05-09 MED ORDER — LIDOCAINE HCL (PF) 2 % IJ SOLN
INTRAMUSCULAR | Status: AC
  Filled 2023-05-09: qty 10

## 2023-05-09 MED ORDER — LIDOCAINE HCL (PF) 2 % IJ SOLN
10.0000 mL | Freq: Once | INTRAMUSCULAR | Status: AC
  Administered 2023-05-09: 10 mL

## 2023-05-09 MED ORDER — HEPARIN SODIUM (PORCINE) 5000 UNIT/ML IJ SOLN
5000.0000 [IU] | Freq: Three times a day (TID) | INTRAMUSCULAR | Status: DC
  Administered 2023-05-09 – 2023-05-15 (×17): 5000 [IU] via SUBCUTANEOUS
  Filled 2023-05-09 (×17): qty 1

## 2023-05-09 MED ORDER — FENTANYL CITRATE PF 50 MCG/ML IJ SOSY
12.5000 ug | PREFILLED_SYRINGE | INTRAMUSCULAR | Status: DC | PRN
  Administered 2023-05-09 – 2023-05-10 (×2): 12.5 ug via INTRAVENOUS
  Filled 2023-05-09 (×2): qty 1

## 2023-05-09 MED ORDER — ORAL CARE MOUTH RINSE
15.0000 mL | OROMUCOSAL | Status: DC
  Administered 2023-05-09 – 2023-05-12 (×7): 15 mL via OROMUCOSAL

## 2023-05-09 MED ORDER — POTASSIUM CHLORIDE CRYS ER 20 MEQ PO TBCR
40.0000 meq | EXTENDED_RELEASE_TABLET | Freq: Two times a day (BID) | ORAL | Status: AC
  Administered 2023-05-09 – 2023-05-10 (×2): 40 meq via ORAL
  Filled 2023-05-09 (×2): qty 2

## 2023-05-09 MED ORDER — FENTANYL CITRATE PF 50 MCG/ML IJ SOSY
PREFILLED_SYRINGE | INTRAMUSCULAR | Status: AC
  Administered 2023-05-09: 50 ug via INTRAVENOUS
  Filled 2023-05-09: qty 1

## 2023-05-09 MED ORDER — BUDESONIDE 0.5 MG/2ML IN SUSP
0.5000 mg | Freq: Two times a day (BID) | RESPIRATORY_TRACT | Status: DC
  Administered 2023-05-09 – 2023-05-12 (×6): 0.5 mg via RESPIRATORY_TRACT
  Filled 2023-05-09 (×6): qty 2

## 2023-05-09 MED ORDER — ORAL CARE MOUTH RINSE
15.0000 mL | OROMUCOSAL | Status: DC
  Administered 2023-05-11 – 2023-05-15 (×12): 15 mL via OROMUCOSAL

## 2023-05-09 NOTE — Progress Notes (Addendum)
 PROGRESS NOTE   Anthony Little  BJY:[id number] DOB: November 10, 1950 DOA: 05/07/2023 PCP: Babs Sciara, MD   Chief Complaint  Patient presents with   Shortness of Breath   Level of care: ICU  Brief Admission History:  73 y.o. male with medical history significant of hypertension, hyperlipidemia, paroxysmal atrial fibrillation, CKD 3A, COPD, AAA, history of tobacco abuse, alcohol abuse who presented to the emergency department due to several weeks of slowly progressive shortness of breath which worsened in the last 2 days.  He endorsed having subjective fevers.  Patient states that fluid was once drawn from his chest about 2 years ago.  In the emergency department, patient was tachypneic, BP was 136/92, other vital signs were within normal range.  Workup in the ED showed WBC of 11.5, hemoglobin 13.1 hematocrit 40.2, MCV 109.8, platelets 137.  BMP was normal except for sodium 134, bicarb 19, BUN 26, creatinine 1.54, BNP 697 (this was 642 on 02/13/22).  Magnesium 1.4.  Influenza A, B, SARS, respiratory, RSV was negative.   Chest x-ray showed dense left basilar consolidation consistent with pneumonia.  Moderate left pleural effusion partially loculated.  Patient was started on IV ceftriaxone and azithromycin, IV Cardizem drip was started.  Hospitalist was asked to admit patient for further evaluation and management.   Assessment and Plan:  Sepsis secondary to pneumonia - continue supportive treatments - blood cultures positive for strep pneumonia  - continue IV antibiotics   Left lung empyema  - CT chest demonstrating some loculations - discussed with PCCM Dr. Celine Mans who recommended transfer to ICU at [facility] or [facility] for chest tube placement  - continue IV antibiotics for now - requested IR to attempt thoracentesis to send fluid for testing  - transfer orders placed  - discussed with patient and wife at bedside - pt wants to remain full code and intubate if needed   Atrial fibrillation with  RVR - exacerbated by empyema, sepsis and pneumonia - continue IV diltiazem infusion for now - his anticoagulation was held due to procedures  - resume anticoagulation as soon as able to safely restart  Folic acid deficiency - continue daily supplementation   Hyperlipidemia - resumed home rosuvastatin   Thrombocytopenia - add on CBC to morning labs to recheck - daily CBC added on 2/26   Hypokalemia - repleted on 2/26  - recheck in AM   DVT prophylaxis: SCDs Code Status:  Full  Family Communication: wife at bedside  Disposition: transfer to ICU at [facility] or [facility] pending bed avail to PCCM service (Dr. Celine Mans)    Consultants:  PCCM  Interventional Radiology   Procedures:   Antimicrobials:  Ceftriaxone 2/25>> Azithromycin 2/25>>   Subjective: Pt reports having some palpitations, having SOB but only with some exertion, at rest he says he is able to manage his SOB and not as symptomatic.  He denies having chest pain.  He is having nonproductive cough.   Objective: Vitals:   05/09/23 1042 05/09/23 1043 05/09/23 1044 05/09/23 1110  BP:  (!) 137/93    Pulse:  99 98   Resp: (!) 29 (!) 23    Temp:    98.1 F (36.7 C)  TempSrc:    Oral  SpO2: 96% 94% 96%   Weight:      Height:        Intake/Output Summary (Last 24 hours) at 05/09/2023 1133 Last data filed at 05/09/2023 0400 Gross per 24 hour  Intake 1136.68 ml  Output 1150 ml  Net -  13.32 ml   Filed Weights   05/08/23 0050  Weight: 86.5 kg   Examination:  General exam: Appears calm and comfortable  Respiratory system: diminished BS on left lung fields; full BS heard on right.   Cardiovascular system: irregularly irregular and tachycardic rate; normal S1 & S2 heard. No JVD, murmurs, rubs, gallops or clicks. 1+ pedal edema. Gastrointestinal system: Abdomen is nondistended, soft and nontender. No organomegaly or masses felt. Normal bowel sounds heard. Central nervous system: Alert and oriented. No focal neurological  deficits. Extremities: Symmetric 5 x 5 power. Skin: No rashes, lesions or ulcers. Psychiatry: Judgement and insight appear normal. Mood & affect appropriate.   Data Reviewed: I have personally reviewed following labs and imaging studies  CBC: Recent Labs  Lab 05/07/23 2012 05/08/23 0548  WBC 11.5* 6.2  HGB 13.1 11.0*  HCT 40.2 33.7*  MCV 109.8* 104.7*  PLT 137* 111*    Basic Metabolic Panel: Recent Labs  Lab 05/07/23 2012 05/08/23 0548 05/09/23 0819  NA 134* 135 133*  K 4.6 3.7 3.2*  CL 99 102 95*  CO2 19* 22 22  GLUCOSE 84 85 111*  BUN 26* 29* 34*  CREATININE 1.54* 1.34* 1.59*  CALCIUM 8.7* 8.1* 8.5*  MG  --  1.4* 1.8  PHOS  --  3.7  --     CBG: No results for input(s): "GLUCAP" in the last 168 hours.  Recent Results (from the past 240 hours)  Resp panel by RT-PCR (RSV, Flu A&B, Covid) Anterior Nasal Swab     Status: None   Collection Time: 05/07/23  7:41 PM   Specimen: Anterior Nasal Swab  Result Value Ref Range Status   SARS Coronavirus 2 by RT PCR NEGATIVE NEGATIVE Final    Comment: (NOTE) SARS-CoV-2 target nucleic acids are NOT DETECTED.  The SARS-CoV-2 RNA is generally detectable in upper respiratory specimens during the acute phase of infection. The lowest concentration of SARS-CoV-2 viral copies this assay can detect is 138 copies/mL. A negative result does not preclude SARS-Cov-2 infection and should not be used as the sole basis for treatment or other patient management decisions. A negative result may occur with  improper specimen collection/handling, submission of specimen other than nasopharyngeal swab, presence of viral mutation(s) within the areas targeted by this assay, and inadequate number of viral copies(<138 copies/mL). A negative result must be combined with clinical observations, patient history, and epidemiological information. The expected result is Negative.  Fact Sheet for Patients:   BloggerCourse.com  Fact Sheet for Healthcare Providers:  SeriousBroker.it  This test is no t yet approved or cleared by the [place] FDA and  has been authorized for detection and/or diagnosis of SARS-CoV-2 by FDA under an Emergency Use Authorization (EUA). This EUA will remain  in effect (meaning this test can be used) for the duration of the COVID-19 declaration under Section 564(b)(1) of the Act, 21 U.S.C.section 360bbb-3(b)(1), unless the authorization is terminated  or revoked sooner.       Influenza A by PCR NEGATIVE NEGATIVE Final   Influenza B by PCR NEGATIVE NEGATIVE Final    Comment: (NOTE) The Xpert Xpress SARS-CoV-2/FLU/RSV plus assay is intended as an aid in the diagnosis of influenza from Nasopharyngeal swab specimens and should not be used as a sole basis for treatment. Nasal washings and aspirates are unacceptable for Xpert Xpress SARS-CoV-2/FLU/RSV testing.  Fact Sheet for Patients: BloggerCourse.com  Fact Sheet for Healthcare Providers: SeriousBroker.it  This test is not yet approved or cleared by the  [place] [employer] trader and has been authorized for detection and/or diagnosis of SARS-CoV-2 by FDA under an TEFL teacher (EUA). This EUA will remain in effect (meaning this test can be used) for the duration of the COVID-19 declaration under Section 564(b)(1) of the Act, 21 U.S.C. section 360bbb-3(b)(1), unless the authorization is terminated or revoked.     Resp Syncytial Virus by PCR NEGATIVE NEGATIVE Final    Comment: (NOTE) Fact Sheet for Patients: BloggerCourse.com  Fact Sheet for Healthcare Providers: SeriousBroker.it  This test is not yet approved or cleared by the [place] FDA and has been authorized for detection and/or diagnosis of SARS-CoV-2 by FDA under an Emergency Use  Authorization (EUA). This EUA will remain in effect (meaning this test can be used) for the duration of the COVID-19 declaration under Section 564(b)(1) of the Act, 21 U.S.C. section 360bbb-3(b)(1), unless the authorization is terminated or revoked.  Performed at [facility], [address], [place], [place] [address]   Blood Culture (routine x 2)     Status: Abnormal (Preliminary result)   Collection Time: 05/07/23 10:40 PM   Specimen: BLOOD LEFT ARM  Result Value Ref Range Status   Specimen Description   Final    BLOOD LEFT ARM Performed at [facility], [address], [place], [place] [address]    Special Requests   Final    BOTTLES DRAWN AEROBIC AND ANAEROBIC Blood Culture adequate volume Performed at [facility], [address] [place], [place] [address]    Culture  Setup Time   Final    GRAM POSITIVE COCCI IN BOTH AEROBIC AND ANAEROBIC BOTTLES Gram Stain Report Called to,Read Back By and Verified With: Melvern Banker, RN AT 1041 05/08/23 BY Kandice Moos Performed at [facility], [address] [place], [place] [address]    Culture STREPTOCOCCUS PNEUMONIAE (A)  Final   Report Status PENDING  Incomplete  Blood Culture (routine x 2)     Status: Abnormal (Preliminary result)   Collection Time: 05/07/23 10:42 PM   Specimen: BLOOD LEFT ARM  Result Value Ref Range Status   Specimen Description   Final    BLOOD LEFT ARM Performed at [facility], [address] [place], [place] [address]    Special Requests   Final    BOTTLES DRAWN AEROBIC AND ANAEROBIC Blood Culture adequate volume Performed at [facility], [address] [place], [place] [address]    Culture  Setup Time   Final    GRAM POSITIVE COCCI IN BOTH AEROBIC AND ANAEROBIC BOTTLES Gram Stain Report Called to,Read Back By and Verified With: EDGAR TINAJERO,RN AT 1041 05/08/23 BY A. SNYDER CRITICAL RESULT CALLED TO, READ BACK BY AND VERIFIED WITH: RN A. Rubbie Battiest [phone] @1912  FH    Culture (A)  Final     STREPTOCOCCUS PNEUMONIAE SUSCEPTIBILITIES TO FOLLOW Performed at [facility], [address] [place], [place] [address]    Report Status PENDING  Incomplete  Blood Culture ID Panel (Reflexed)     Status: Abnormal   Collection Time: 05/07/23 10:42 PM  Result Value Ref Range Status   Enterococcus faecalis NOT DETECTED NOT DETECTED Final   Enterococcus Faecium NOT DETECTED NOT DETECTED Final   Listeria monocytogenes NOT DETECTED NOT DETECTED Final   Staphylococcus species NOT DETECTED NOT DETECTED Final   Staphylococcus aureus (BCID) NOT DETECTED NOT DETECTED Final   Staphylococcus epidermidis NOT DETECTED NOT DETECTED Final   Staphylococcus lugdunensis NOT DETECTED NOT DETECTED Final  Streptococcus species DETECTED (A) NOT DETECTED Final    Comment: CRITICAL RESULT CALLED TO, READ BACK BY AND VERIFIED WITH: RN A. RONE [phone] @1912  FH`    Streptococcus agalactiae NOT DETECTED NOT DETECTED Final   Streptococcus pneumoniae DETECTED (A) NOT DETECTED Final    Comment: CRITICAL RESULT CALLED TO, READ BACK BY AND VERIFIED WITH: RN A. RONE [phone] @1912  FH    Streptococcus pyogenes NOT DETECTED NOT DETECTED Final   A.calcoaceticus-baumannii NOT DETECTED NOT DETECTED Final   Bacteroides fragilis NOT DETECTED NOT DETECTED Final   Enterobacterales NOT DETECTED NOT DETECTED Final   Enterobacter cloacae complex NOT DETECTED NOT DETECTED Final   Escherichia coli NOT DETECTED NOT DETECTED Final   Klebsiella aerogenes NOT DETECTED NOT DETECTED Final   Klebsiella oxytoca NOT DETECTED NOT DETECTED Final   Klebsiella pneumoniae NOT DETECTED NOT DETECTED Final   Proteus species NOT DETECTED NOT DETECTED Final   Salmonella species NOT DETECTED NOT DETECTED Final   Serratia marcescens NOT DETECTED NOT DETECTED Final   Haemophilus influenzae NOT DETECTED NOT DETECTED Final   Neisseria meningitidis NOT DETECTED NOT DETECTED Final   Pseudomonas aeruginosa NOT DETECTED NOT DETECTED Final    Stenotrophomonas maltophilia NOT DETECTED NOT DETECTED Final   Candida albicans NOT DETECTED NOT DETECTED Final   Candida auris NOT DETECTED NOT DETECTED Final   Candida glabrata NOT DETECTED NOT DETECTED Final   Candida krusei NOT DETECTED NOT DETECTED Final   Candida parapsilosis NOT DETECTED NOT DETECTED Final   Candida tropicalis NOT DETECTED NOT DETECTED Final   Cryptococcus neoformans/gattii NOT DETECTED NOT DETECTED Final    Comment: Performed at [facility], [address] [place], [place] [address]  MRSA Next Gen by PCR, Nasal     Status: None   Collection Time: 05/08/23 12:40 AM   Specimen: Nasal Mucosa; Nasal Swab  Result Value Ref Range Status   MRSA by PCR Next Gen NOT DETECTED NOT DETECTED Final    Comment: (NOTE) The GeneXpert MRSA Assay (FDA approved for NASAL specimens only), is one component of a comprehensive MRSA colonization surveillance program. It is not intended to diagnose MRSA infection nor to guide or monitor treatment for MRSA infections. Test performance is not FDA approved in patients less than 42 years old. Performed at [facility], [address] [place], [place] [address]      Radiology Studies: [facility] 1 View Result Date: 05/08/2023 CLINICAL DATA:  Short of breath EXAM: PORTABLE CHEST 1 VIEW COMPARISON:  Radiograph 05/07/2023 FINDINGS: Interval increase in volume of LEFT pleural effusion. The entire LEFT hemithorax is now opacified. No pneumothorax. RIGHT lung clear. No acute osseous abnormality. IMPRESSION: Progressive opacification entire LEFT hemithorax. Findings most consistent with increasing LEFT pleural effusion. Electronically Signed   By: Genevive Bi M.D.   On: 05/08/2023 17:42   ECHOCARDIOGRAM COMPLETE Result Date: 05/08/2023    ECHOCARDIOGRAM REPORT   Patient Name:   HANNAH CRILL Date of Exam: 05/08/2023 Medical Rec #:  [id number]          Height:       70.0 in Accession #:    [id number]         Weight:       190.7 lb  Date of Birth:  Jul 20, 1950          BSA:          2.045 m Patient Age:    53 years  BP:           121/94 mmHg Patient Gender: M                  HR:           105 bpm. [facility] Procedure: 2D Echo, Cardiac Doppler and Color Doppler (Both Spectral and Color            Flow Doppler were utilized during procedure). STAT ECHO Indications:    Pericardial Effusion l31.3  History:        Patient has prior history of Echocardiogram examinations, most                 recent 03/03/2022.  Sonographer:    Celesta Gentile RCS Referring Phys: [id number] ALEXANDRA DEZII IMPRESSIONS  1. Very difficult study. Limited windows for imaging that were off axis. All walls of LV not fully visualized well enough to comment of regional wall motion. Overall LVEF appears grossly normal . There is mild concentric left ventricular hypertrophy.  2. Right ventricular systolic function is low normal. The right ventricular size is normal.  3. Left atrial size was mildly dilated.  4. Right atrial size was mildly dilated.  5. Large pleural effusion.  6. The mitral valve is normal in structure. Trivial mitral valve regurgitation.  7. The aortic valve is tricuspid. Aortic valve regurgitation is not visualized. Aortic valve sclerosis/calcification is present, without any evidence of aortic stenosis.  8. The inferior vena cava is dilated in size with <50% respiratory variability, suggesting right atrial pressure of 15 mmHg. FINDINGS  Left Ventricle: Very difficult study. Limited windows for imaging that were off axis. All walls of LV not fully visualized well enough to comment of regional wall motion. Overall LVEF appears grossly normal. The left ventricular internal cavity size was  normal in size. There is mild concentric left ventricular hypertrophy. Right Ventricle: The right ventricular size is normal. Right vetricular wall thickness was not assessed. Right ventricular systolic function is low normal. Left Atrium: Left atrial  size was mildly dilated. Right Atrium: Right atrial size was mildly dilated. Pericardium: Trivial pericardial effusion is present. Mitral Valve: The mitral valve is normal in structure. Trivial mitral valve regurgitation. Tricuspid Valve: The tricuspid valve is normal in structure. Tricuspid valve regurgitation is trivial. Aortic Valve: The aortic valve is tricuspid. Aortic valve regurgitation is not visualized. Aortic valve sclerosis/calcification is present, without any evidence of aortic stenosis. Pulmonic Valve: The pulmonic valve was normal in structure. Pulmonic valve regurgitation is not visualized. Aorta: The aortic root is normal in size and structure. Venous: The inferior vena cava is dilated in size with less than 50% respiratory variability, suggesting right atrial pressure of 15 mmHg. IAS/Shunts: No atrial level shunt detected by color flow Doppler. Additional Comments: There is a large pleural effusion.  LEFT VENTRICLE PLAX 2D LVIDd:         4.60 cm LVIDs:         3.60 cm LV PW:         1.20 cm LV IVS:        1.20 cm LVOT diam:     2.10 cm LV SV:         33 LV SV Index:   16 LVOT Area:     3.46 cm  RIGHT VENTRICLE RV S prime:     6.74 cm/s TAPSE (M-mode): 1.6 cm LEFT ATRIUM           Index  RIGHT ATRIUM           Index LA diam:      4.10 cm 2.00 cm/m   RA Area:     21.90 cm LA Vol (A4C): 88.4 ml 43.22 ml/m  RA Volume:   72.90 ml  35.64 ml/m  AORTIC VALVE LVOT Vmax:   77.38 cm/s LVOT Vmean:  44.250 cm/s LVOT VTI:    0.095 m  AORTA Ao Root diam: 4.00 cm MITRAL VALVE MV Area (PHT): 5.02 cm    SHUNTS MV Decel Time: 151 msec    Systemic VTI:  0.09 m MV E velocity: 63.80 cm/s  Systemic Diam: 2.10 cm MV A velocity: 54.40 cm/s MV E/A ratio:  1.17 Dietrich Pates MD Electronically signed by Dietrich Pates MD Signature Date/Time: 05/08/2023/10:38:17 AM    Final    DG Chest 2 View Result Date: 05/07/2023 CLINICAL DATA:  Short of breath, progressive weakness EXAM: CHEST - 2 VIEW COMPARISON:  12/28/2022  FINDINGS: Frontal and lateral views of the chest demonstrate an unremarkable cardiac silhouette. There is dense left basilar consolidation, with moderate left pleural effusion which is partially loculated. Right chest is clear. No pneumothorax. No acute bony abnormalities. IMPRESSION: 1. Dense left basilar consolidation consistent with pneumonia. 2. Moderate left pleural effusion, partially loculated. Electronically Signed   By: Sharlet Salina M.D.   On: 05/07/2023 21:41    Scheduled Meds:  Chlorhexidine Gluconate Cloth  6 each Topical Q0600   dextromethorphan-guaiFENesin  1 tablet Oral BID   folic acid  1 mg Oral Daily   [START ON 05/10/2023] furosemide  40 mg Intravenous Daily   mouth rinse  15 mL Mouth Rinse 4 times per day   rosuvastatin  10 mg Oral Daily   sertraline  50 mg Oral Daily   Continuous Infusions:  azithromycin 500 mg (05/09/23 0913)   cefTRIAXone (ROCEPHIN)  IV 2 g (05/09/23 1110)   diltiazem (CARDIZEM) infusion 15 mg/hr (05/09/23 0606)     LOS: 2 days   Critical Care Procedure Note Authorized and Performed by: Maryln Manuel MD  Total Critical Care time:  70 mins  Due to a high probability of clinically significant, life threatening deterioration, the patient required my highest level of preparedness to intervene emergently and I personally spent this critical care time directly and personally managing the patient.  This critical care time included obtaining a history; examining the patient, pulse oximetry; ordering and review of studies; arranging urgent treatment with development of a management plan; evaluation of patient's response of treatment; frequent reassessment; and discussions with other providers.  This critical care time was performed to assess and manage the high probability of imminent and life threatening deterioration that could result in multi-organ failure.  It was exclusive of separately billable procedures and treating other patients and teaching time.     Standley Dakins, MD How to contact the [facility] Attending or Consulting provider 7A - 7P or covering provider during after hours 7P -7A, for this patient?  Check the care team in [facility] and look for a) attending/consulting [facility] provider listed and b) the [facility] team listed Log into [email or web address] to find provider on call.  Locate the [facility] provider you are looking for under [facility] and page to a number that you can be directly reached. If you still have difficulty reaching the provider, please page the [facility] (Director on Call) for the Hospitalists listed on amion for assistance.  05/09/2023, 11:33 AM

## 2023-05-09 NOTE — Telephone Encounter (Signed)
 FYI- [id number] called to let you know patient is in ICU at [facility]

## 2023-05-09 NOTE — H&P (Addendum)
 NAME:  Anthony Little, MRN:  [id number], DOB:  [birth year], LOS: 2 ADMISSION DATE:  05/07/2023, CONSULTATION DATE:  2/26 REFERRING MD:  Laural Benes, CHIEF COMPLAINT:  empyema, CAP and bacteremia   History of Present Illness:  73 year old male patient typically lives at home with his wife presented to [facility] on 2/24 with chief complaint of progressive shortness of breath, weakness, and cough.  He is typically ambulatory at home but does admit to being more sedentary over the winter. Noted he was starting to feel more short of breath around 2/20 with new cough, and intermittent subjective fever and worsening weakness.  In the emergency room he was found to be tachypneic, had a mild white blood cell count of 11.5, bicarbonate was 19 creatinine 1.54 respiratory viral panel was negative he had a large left loculated pleural effusion, He was admitted by the medical team started on IV ceftriaxone and azithromycin.  He was also found to be in atrial fibrillation with RVR and therefore was started on a Cardizem drip.  Cultures were sent, he was admitted to the intensive care subsequent evaluation is as follows: Blood cultures positive for strep pneumoniae, he underwent a left-sided thoracentesis yielding 1 L of fluid the Gram stain was positive for GPC, post chest x-ray still showing significant volume loss on the left side, he has now been referred to [facility] for sepsis, left-sided empyema, and pneumococcal bacteremia.  Infectious diseases already seen remotely he remains on antibiotics  Pertinent  Medical History  Accessible atrial fibrillation, typically on Eliquis and beta-blockade, hyperlipidemia, CKD stage IIIa, COPD, AAA, history of tobacco abuse, history of alcohol abuse. Raynauds   Significant Hospital Events: Including procedures, antibiotic start and stop dates in addition to other pertinent events   2/24 admitted to [facility] PNA, loculated effusion. Started on ABX. IVFs. Was in AF  w/ RVR.,started on CCB infusion.  2/25 increased WOB still on 3 lpm but had complete opacification of left hemithorax,. Lasix increased.  2/26 seen remotely in consult by ID for strep bacteremia. Felt bacteremia 2/2 CAP. Recommended drainage of pleural space, 5d azith and to cont ctx w/ f/u blood cultures for clearance. Underwent left thoracentesis. Drained 1 liter. Glucose 20, gm stain GPC. Transferred to cone for chest tube placement and additional support   Interim History / Subjective:  Sob. Now s/p left thoracentesis. Still in af  Objective   Blood pressure (!) 144/86, pulse (!) 109, temperature 98.3 F (36.8 C), temperature source Oral, resp. rate (!) 31, height 5\' 10"  (1.778 m), weight 86.5 kg, SpO2 93%.    Vent Mode: BIPAP FiO2 (%):  [40 %] 40 % Set Rate:  [15 bmp-20 bmp] 20 bmp PEEP:  [5 cmH20] 5 cmH20   Intake/Output Summary (Last 24 hours) at 05/09/2023 1805 Last data filed at 05/09/2023 0400 Gross per 24 hour  Intake 343 ml  Output 800 ml  Net -457 ml   Filed Weights   05/08/23 0050  Weight: 86.5 kg    Examination: General: 73 year old male. Laying in bed. No distress but does exhibit increased WOB. And Mottled LEs  HENT: NCAT  no JVD  Lungs: crackles right base. Marked decrease in left sided breath sounds. Pcxr cont to show near complete opacification of left side even s/p thoracentesis. There is a component of leftward airway shift so suspect a component of this is also atx. POCUS w/ mod effusion w/ fibrotic stranding  Cardiovascular: Reg irreg af on tele af  Abdomen:  soft  Extremities: cool a little mottled. Trace LE edema  Neuro: awake and oriented  GU: due to void   Resolved Hospital Problem list     Assessment & Plan:   acute hypoxic respiratory failure secondary to community-acquired pneumonia (pneumococcal) and empyema, post-thoracentesis film also shows probable element of atelectasis given airway shifting towards the left Underlying COPD Plan He will  need thoracostomy tube placement with continuous drainage Will likely need pleural lytics BIPAP PRN. Will see how he does s/p drainage of effusion Scheduled bronchodilators for underlying COPD Continue broad-spectrum antibiotics as mentioned below Follow-up pleural fluid culture Will hold his DOAC as he will likely need pleural lytic therapy  Sepsis secondary to community-acquired pneumonia with pneumococcal  bacteremia -Seen by infectious disease Plan Continuing supportive care Complete 5 days of azithromycin Continue ceftriaxone, follow-up blood cultures to assess for bacterial clearance Length of therapy still to be determined We can hold off on TEE for now Checking lactic acid, he is mottled I am worried about occult shock  Atrial fibrillation with RVR.  Has a history of known A-fib on a DOAC at baseline and beta-blocker Plan Continuing calcium channel blocker infusion for now with goal of rate control Keep euvolemic Holding systemic anticoagulation, okay to continue prophylactic dosing Continue telemetry monitoring  Anion gap metabolic acidosis.  In setting of sepsis highly concerning for lactic acidosis Plan Checking lactate as mentioned  During her adequate mean arterial pressure  Fluid and electrolyte imbalance: Hypokalemia and hyponatremia. Plan Reportedly replace potassium earlier today, unclear We will repeat chemistry now, and replace as indicated  CKD stage IIIa.  Currently at baseline Plan Continue Ensure adequate mean arterial pressure Renal dose medications Strict intake output A.m. chemistry  Anemia.  No acute evidence of bleeding Plan Will continue to monitor.  Trigger for transfusion is hemoglobin less than 7.. Serial lactatesBest Practice (right click and "Reselect all SmartList Selections" daily)   Diet/type: NPO w/ oral meds DVT prophylaxis prophylactic heparin  Pressure ulcer(s): N/A GI prophylaxis: N/A Lines: N/A Foley:  N/A Code Status:   full code Last date of multidisciplinary goals of care discussion [pendign]  Labs   CBC: Recent Labs  Lab 05/07/23 2012 05/08/23 0548 05/09/23 0819  WBC 11.5* 6.2 4.9  HGB 13.1 11.0* 11.5*  HCT 40.2 33.7* 34.2*  MCV 109.8* 104.7* 103.3*  PLT 137* 111* 141*    Basic Metabolic Panel: Recent Labs  Lab 05/07/23 2012 05/08/23 0548 05/09/23 0819  NA 134* 135 133*  K 4.6 3.7 3.2*  CL 99 102 95*  CO2 19* 22 22  GLUCOSE 84 85 111*  BUN 26* 29* 34*  CREATININE 1.54* 1.34* 1.59*  CALCIUM 8.7* 8.1* 8.5*  MG  --  1.4* 1.8  PHOS  --  3.7  --    GFR: Estimated Creatinine Clearance: 44 mL/min (A) (by C-G formula based on SCr of 1.59 mg/dL (H)). Recent Labs  Lab 05/07/23 2012 05/07/23 2242 05/08/23 0548 05/09/23 0819  PROCALCITON  --   --  2.62  --   WBC 11.5*  --  6.2 4.9  LATICACIDVEN  --  1.5  --   --     Liver Function Tests: Recent Labs  Lab 05/08/23 0548 05/09/23 0819  AST 25 41  ALT 12 18  ALKPHOS 77 90  BILITOT 1.5* 1.3*  PROT 6.2* 6.7  ALBUMIN 2.3* 2.3*   No results for input(s): "LIPASE", "AMYLASE" in the last 168 hours. No results for input(s): "AMMONIA" in the last  168 hours.  ABG    Component Value Date/Time   TCO2 21 (L) 07/01/2022 2211     Coagulation Profile: Recent Labs  Lab 05/07/23 2112  INR 1.2    Cardiac Enzymes: No results for input(s): "CKTOTAL", "CKMB", "CKMBINDEX", "TROPONINI" in the last 168 hours.  HbA1C: Hgb A1c MFr Bld  Date/Time Value Ref Range Status  07/17/2022 12:16 PM 5.1 4.8 - 5.6 % Final    Comment:             Prediabetes: 5.7 - 6.4          Diabetes: >6.4          Glycemic control for adults with diabetes: <7.0     CBG: No results for input(s): "GLUCAP" in the last 168 hours.  Review of Systems:   Review of Systems  Constitutional:  Positive for fever and malaise/fatigue.  HENT: Negative.    Eyes: Negative.   Respiratory:  Positive for cough, sputum production and shortness of breath.    Cardiovascular:  Positive for chest pain and leg swelling.  Gastrointestinal: Negative.   Genitourinary: Negative.   Musculoskeletal: Negative.   Skin: Negative.   Neurological: Negative.   Endo/Heme/Allergies: Negative.   Psychiatric/Behavioral: Negative.       Past Medical History:  He,  has a past medical history of Abdominal aortic aneurysm (AAA) (HCC), Alcoholic (HCC), Atrial fibrillation and flutter (HCC), CKD (chronic kidney disease) stage 3, GFR 30-59 ml/min (HCC), CVA (cerebral infarction), Essential hypertension, Hyperlipidemia, and Thoracic aortic aneurysm (HCC).   Surgical History:   Past Surgical History:  Procedure Laterality Date   BIOPSY  08/17/2020   Procedure: BIOPSY;  Surgeon: Lanelle Bal, DO;  [facility];  Service: Endoscopy;;   BIOPSY  02/21/2021   Procedure: BIOPSY;  Surgeon: Lanelle Bal, DO;  [facility];  Service: Endoscopy;;   COLONOSCOPY  12/2009   Diverticulosis, mild internal hemorrhoids, multiple colon polyps removed (adenomatous). One irregular shaped polyp was tattooed. Next colonoscopy due October 2016.   COLONOSCOPY N/A 11/01/2015   Procedure: COLONOSCOPY;  Surgeon: West Bali, MD;  [facility];  Service: Endoscopy;  Laterality: N/A;  100   COLONOSCOPY WITH PROPOFOL N/A 08/17/2020   Procedure: COLONOSCOPY WITH PROPOFOL;  Surgeon: Lanelle Bal, DO;  [facility];  Service: Endoscopy;  Laterality: N/A;  2:15pm   ESOPHAGOGASTRODUODENOSCOPY N/A 10/08/2012   SLF: Moderate erosive gastritis/duodenitis. Biopsies benign.   ESOPHAGOGASTRODUODENOSCOPY N/A 11/01/2015   Procedure: ESOPHAGOGASTRODUODENOSCOPY (EGD);  Surgeon: West Bali, MD;  [facility];  Service: Endoscopy;  Laterality: N/A;   ESOPHAGOGASTRODUODENOSCOPY (EGD) WITH PROPOFOL N/A 02/21/2021   Procedure: ESOPHAGOGASTRODUODENOSCOPY (EGD) WITH PROPOFOL;  Surgeon: Lanelle Bal, DO;  [facility];  Service:  Endoscopy;  Laterality: N/A;   POLYPECTOMY  08/17/2020   Procedure: POLYPECTOMY;  Surgeon: Lanelle Bal, DO;  [facility];  Service: Endoscopy;;     Social History:   reports that he has been smoking cigarettes. He started smoking about 7 years ago. He has a 3.8 pack-year smoking history. He has never used smokeless tobacco. He reports current alcohol use of about 4.0 standard drinks of alcohol per week. He reports that he does not use drugs.   Family History:  His family history includes Diabetes in his maternal grandfather and mother; Parkinson's disease in his father. There is no history of Ulcers or Colon cancer.   Allergies Allergies  Allergen Reactions  Ivp Dye [Iodinated Contrast Media] Nausea And Vomiting     Home Medications  Prior to Admission medications   Medication Sig Start Date End Date Taking? Authorizing Provider  albuterol (PROVENTIL) (2.5 MG/3ML) 0.083% nebulizer solution Take 3 mLs (2.5 mg total) by nebulization every 6 (six) hours as needed for wheezing or shortness of breath. 07/11/22  Yes Coralyn Helling, MD  ELIQUIS 5 MG TABS tablet TAKE 1 TABLET BY MOUTH TWICE A DAY 03/12/23  Yes Jonelle Sidle, MD  glucosamine-chondroitin 500-400 MG tablet Take 2 tablets by mouth every morning.   Yes [provider]  Glycerin-Hypromellose-PEG 400 (DRY EYE RELIEF DROPS OP) Apply 2 drops to eye daily as needed (irritation). Thera tears   Yes [provider]  Melatonin 10 MG TABS Take 1 tablet by mouth at bedtime.   Yes [provider]  metoprolol succinate (TOPROL-XL) 25 MG 24 hr tablet TAKE 1 TABLET (25 MG TOTAL) BY MOUTH DAILY. 11/27/22  Yes Babs Sciara, MD  potassium chloride SA (KLOR-CON M) 20 MEQ tablet TAKE 1 TABLET BY MOUTH EVERY DAY 03/09/23  Yes Babs Sciara, MD  Probiotic Product (PROBIOTIC ADVANCED PO) Take 1 tablet by mouth daily.   Yes [provider]  rosuvastatin (CRESTOR) 10 MG tablet TAKE 1 TABLET BY MOUTH  EVERY DAY 01/15/23  Yes Luking, Scott A, MD  sertraline (ZOLOFT) 50 MG tablet TAKE 1 TABLET BY MOUTH EVERY DAY 01/15/23  Yes Luking, Scott A, MD  furosemide (LASIX) 20 MG tablet Take 1 tablet (20 mg total) by mouth daily. Patient not taking: Reported on 05/08/2023 02/12/23   Babs Sciara, MD     Critical care time: 45 min

## 2023-05-09 NOTE — Progress Notes (Signed)
 ID brief note ( remote, not seen in person)  Remains  afebrile On 3 L [place]  2/24 blood cx with strep pneumoniae, BC ID with no resistance markers  TTE 2/25 Limited study with no definite vegetations.    Assessment # Strep pneumoniae bacteremia 2/2, no meningitis  # PNA/loculated pleural effusion   Plan -Continue ceftriaxone, complete 5 days of azithromycin   - Fu 2/26 blood cx for clearance. Strep pneumoniae uncommon cause for native valve endocarditis and given bacteremia  secondary to pneumonia, may not need TEE if repeat blood cultures clears - Fu strep pneumonia and Legionella pneumonia antigen plus sputum culture -Monitor CBC and CMP on antibiotics -Follow-up thoracentesis by IR today.  Please send cultures as appropriate.  Consider pulmonary consultation   Odette Fraction, MD Infectious Disease Physician [facility] [address] [place], [place] [address] Phone: [phone]  Fax: [phone]

## 2023-05-09 NOTE — Hospital Course (Signed)
 73 y.o. male with medical history significant of hypertension, hyperlipidemia, paroxysmal atrial fibrillation, CKD 3A, COPD, AAA, history of tobacco abuse, alcohol abuse who presented to the emergency department due to several weeks of slowly progressive shortness of breath which worsened in the last 2 days.  He endorsed having subjective fevers.  Patient states that fluid was once drawn from his chest about 2 years ago.  In the emergency department, patient was tachypneic, BP was 136/92, other vital signs were within normal range.  Workup in the ED showed WBC of 11.5, hemoglobin 13.1 hematocrit 40.2, MCV 109.8, platelets 137.  BMP was normal except for sodium 134, bicarb 19, BUN 26, creatinine 1.54, BNP 697 (this was 642 on 02/13/22).  Magnesium 1.4.  Influenza A, B, SARS, respiratory, RSV was negative.   Chest x-ray showed dense left basilar consolidation consistent with pneumonia.  Moderate left pleural effusion partially loculated.  Patient was started on IV ceftriaxone and azithromycin, IV Cardizem drip was started.  Hospitalist was asked to admit patient for further evaluation and management.

## 2023-05-09 NOTE — Procedures (Signed)
 PROCEDURE SUMMARY:  Successful image-guided left thoracentesis. Yielded 1.0 liters of cloudy yellow fluid. Patient tolerated procedure well. EBL: Zero No immediate complications.  Specimen was sent for labs. Post procedure CXR shows no pneumothorax.  Please see imaging section of Epic for full dictation.  Villa Herb PA-C 05/09/2023 1:27 PM

## 2023-05-09 NOTE — Procedures (Signed)
 Insertion of Chest Tube Procedure Note  Anthony Little  [id number]  May 03, 1950  Date:05/09/23  Time:7:11 PM    Provider Performing: Osborne Oman Student Nurse Practitioner with Anders Simmonds NP  Procedure: Pleural Catheter Insertion w/ Imaging Guidance (40981)  Indication(s) Effusion  Consent Risks of the procedure as well as the alternatives and risks of each were explained to the patient and/or caregiver.  Consent for the procedure was obtained and is signed in the bedside chart  Anesthesia Topical only with 1% lidocaine    Time Out Verified patient identification, verified procedure, site/side was marked, verified correct patient position, special equipment/implants available, medications/allergies/relevant history reviewed, required imaging and test results available.   Sterile Technique Maximal sterile technique including full sterile barrier drape, hand hygiene, sterile gown, sterile gloves, mask, hair covering, sterile ultrasound probe cover (if used).   Procedure Description Ultrasound used to identify appropriate pleural anatomy for placement and overlying skin marked. Area of placement cleaned and draped in sterile fashion.  A 14 French pigtail pleural catheter was placed into the left pleural space using Seldinger technique. Appropriate return of fluid was obtained.  The tube was connected to atrium and placed on -20 cm H2O wall suction.   Complications/Tolerance None; patient tolerated the procedure well. Chest X-ray is ordered to verify placement.   EBL Minimal  Specimen(s) none

## 2023-05-10 ENCOUNTER — Inpatient Hospital Stay (HOSPITAL_COMMUNITY): Payer: Medicare Other

## 2023-05-10 DIAGNOSIS — J189 Pneumonia, unspecified organism: Secondary | ICD-10-CM | POA: Diagnosis not present

## 2023-05-10 DIAGNOSIS — J13 Pneumonia due to Streptococcus pneumoniae: Secondary | ICD-10-CM | POA: Diagnosis not present

## 2023-05-10 DIAGNOSIS — J9601 Acute respiratory failure with hypoxia: Secondary | ICD-10-CM | POA: Diagnosis not present

## 2023-05-10 DIAGNOSIS — A419 Sepsis, unspecified organism: Secondary | ICD-10-CM | POA: Diagnosis not present

## 2023-05-10 DIAGNOSIS — J860 Pyothorax with fistula: Secondary | ICD-10-CM

## 2023-05-10 DIAGNOSIS — F17210 Nicotine dependence, cigarettes, uncomplicated: Secondary | ICD-10-CM | POA: Diagnosis not present

## 2023-05-10 LAB — CULTURE, BLOOD (ROUTINE X 2)
Special Requests: ADEQUATE
Special Requests: ADEQUATE

## 2023-05-10 LAB — COMPREHENSIVE METABOLIC PANEL WITH GFR
ALT: 52 U/L — ABNORMAL HIGH (ref 0–44)
AST: 177 U/L — ABNORMAL HIGH (ref 15–41)
Albumin: 1.8 g/dL — ABNORMAL LOW (ref 3.5–5.0)
Alkaline Phosphatase: 123 U/L (ref 38–126)
Anion gap: 15 (ref 5–15)
BUN: 37 mg/dL — ABNORMAL HIGH (ref 8–23)
CO2: 23 mmol/L (ref 22–32)
Calcium: 8.3 mg/dL — ABNORMAL LOW (ref 8.9–10.3)
Chloride: 95 mmol/L — ABNORMAL LOW (ref 98–111)
Creatinine, Ser: 1.73 mg/dL — ABNORMAL HIGH (ref 0.61–1.24)
GFR, Estimated: 42 mL/min — ABNORMAL LOW
Glucose, Bld: 99 mg/dL (ref 70–99)
Potassium: 4.1 mmol/L (ref 3.5–5.1)
Sodium: 133 mmol/L — ABNORMAL LOW (ref 135–145)
Total Bilirubin: 1.2 mg/dL (ref 0.0–1.2)
Total Protein: 5.9 g/dL — ABNORMAL LOW (ref 6.5–8.1)

## 2023-05-10 LAB — CBC
HCT: 33.3 % — ABNORMAL LOW (ref 39.0–52.0)
Hemoglobin: 11.4 g/dL — ABNORMAL LOW (ref 13.0–17.0)
MCH: 35 pg — ABNORMAL HIGH (ref 26.0–34.0)
MCHC: 34.2 g/dL (ref 30.0–36.0)
MCV: 102.1 fL — ABNORMAL HIGH (ref 80.0–100.0)
Platelets: 133 K/uL — ABNORMAL LOW (ref 150–400)
RBC: 3.26 MIL/uL — ABNORMAL LOW (ref 4.22–5.81)
RDW: 13.1 % (ref 11.5–15.5)
WBC: 6.4 K/uL (ref 4.0–10.5)
nRBC: 0 % (ref 0.0–0.2)

## 2023-05-10 LAB — PROCALCITONIN: Procalcitonin: 2.77 ng/mL

## 2023-05-10 LAB — MAGNESIUM: Magnesium: 1.7 mg/dL (ref 1.7–2.4)

## 2023-05-10 MED ORDER — SODIUM CHLORIDE (PF) 0.9 % IJ SOLN
10.0000 mg | Freq: Once | INTRAMUSCULAR | Status: AC
  Administered 2023-05-10: 10 mg via INTRAPLEURAL
  Filled 2023-05-10: qty 10

## 2023-05-10 MED ORDER — CHLORHEXIDINE GLUCONATE CLOTH 2 % EX PADS
6.0000 | MEDICATED_PAD | Freq: Every day | CUTANEOUS | Status: DC
  Administered 2023-05-10 – 2023-05-14 (×4): 6 via TOPICAL

## 2023-05-10 MED ORDER — PENICILLIN G POTASSIUM 20000000 UNITS IJ SOLR
9.0000 10*6.[IU] | Freq: Two times a day (BID) | INTRAVENOUS | Status: DC
  Administered 2023-05-11 – 2023-05-15 (×8): 9 10*6.[IU] via INTRAVENOUS
  Filled 2023-05-10 (×10): qty 9

## 2023-05-10 MED ORDER — DILTIAZEM HCL 30 MG PO TABS
30.0000 mg | ORAL_TABLET | Freq: Four times a day (QID) | ORAL | Status: DC
  Administered 2023-05-10 – 2023-05-14 (×17): 30 mg via ORAL
  Filled 2023-05-10 (×19): qty 1

## 2023-05-10 MED ORDER — STERILE WATER FOR INJECTION IJ SOLN
5.0000 mg | Freq: Once | RESPIRATORY_TRACT | Status: AC
  Administered 2023-05-10: 5 mg via INTRAPLEURAL
  Filled 2023-05-10: qty 5

## 2023-05-10 MED ORDER — SODIUM CHLORIDE (PF) 0.9 % IJ SOLN
10.0000 mg | Freq: Once | INTRAMUSCULAR | Status: AC
  Administered 2023-05-10: 10 mg via INTRAPLEURAL
  Filled 2023-05-10 (×2): qty 10

## 2023-05-10 MED ORDER — LACTATED RINGERS IV SOLN: INTRAVENOUS | Status: AC

## 2023-05-10 MED ORDER — OXYCODONE-ACETAMINOPHEN 5-325 MG PO TABS
1.0000 | ORAL_TABLET | Freq: Four times a day (QID) | ORAL | Status: DC | PRN
  Administered 2023-05-10 – 2023-05-11 (×2): 2 via ORAL
  Filled 2023-05-10 (×2): qty 2

## 2023-05-10 MED ORDER — STERILE WATER FOR INJECTION IJ SOLN
5.0000 mg | Freq: Once | INTRAMUSCULAR | Status: AC
  Administered 2023-05-10: 5 mg via INTRAPLEURAL
  Filled 2023-05-10 (×2): qty 5

## 2023-05-10 MED ORDER — QUETIAPINE FUMARATE 25 MG PO TABS
25.0000 mg | ORAL_TABLET | Freq: Every day | ORAL | Status: DC
  Administered 2023-05-11 – 2023-05-13 (×3): 25 mg via ORAL
  Filled 2023-05-10 (×4): qty 1

## 2023-05-10 MED ORDER — FENTANYL CITRATE PF 50 MCG/ML IJ SOSY: 25.0000 ug | PREFILLED_SYRINGE | INTRAMUSCULAR | Status: DC | PRN

## 2023-05-10 MED ORDER — SODIUM CHLORIDE 3 % IN NEBU
4.0000 mL | INHALATION_SOLUTION | Freq: Two times a day (BID) | RESPIRATORY_TRACT | Status: DC
Start: 2023-05-10 — End: 2023-05-13
  Administered 2023-05-10 – 2023-05-11 (×3): 4 mL via RESPIRATORY_TRACT
  Filled 2023-05-10 (×3): qty 4

## 2023-05-10 NOTE — Progress Notes (Signed)
 RT attempted  CPT- metaneb, patient unable to do at this time due to nausea.

## 2023-05-10 NOTE — IPAL (Signed)
  Interdisciplinary Goals of Care Family Meeting   Date carried out: 05/10/2023  Location of the meeting: Bedside  Member's involved: Physician, Nurse Practitioner, Bedside Registered Nurse, and Family Member or next of kin  Durable Power of Attorney or acting medical decision maker: We spoke outside the room with the patient's wife Anthony Little  Discussion: We discussed goals of care for Anthony Little .   Sink shared with [place] a concern about raise slow decline.  He really has had about 2 years where his mobility has been getting worse.  Really requires a fair amount of support at baseline.  She is concerned about his ability to recover from this acute illness, and also her ability to care for him afterwards.  We discussed with her treatment of pneumonia, bacteremia, and overall acute illness and its effect on his other chronic comorbidities particularly physical decline.  it is unclear as to whether or not he is left lung collapse is secondary to mucous plugging or could there be a tumor resulting in airway obstruction.  Freda  asked specifically about prognosis, currently it is too early to tell, however failure to improve left lung aeration would raise suspicion of possible occult malignancy which would clearly make prognosis much more.  Code status:   Code Status: Do not attempt resuscitation (DNR) PRE-ARREST INTERVENTIONS DESIRED  We would consider short-term ventilation to prevent cardiopulmonary arrest, however patient would not want long-term ventilation or tracheostomy.  Should he have a cardiac arrest he is DO NOT RESUSCITATE   Disposition: Continue current acute care  Time spent for the meeting: 22 minutes    Shelby Mattocks, NP  05/10/2023, 2:35 PM

## 2023-05-10 NOTE — TOC Initial Note (Signed)
 Transition of Care [facility]) - Initial/Assessment Note    Patient Details  Name: Anthony Little MRN: [id number] Date of Birth: February 01, 1951  Transition of [facility]) CM/SW Contact:    Elliot Cousin, RN Phone Number: [phone] 05/10/2023, 5:19 PM  Clinical Narrative:                   TOC CM spoke to pt and wife at bedside. States he was independent at home. Had [employer] in the past for [facility]. They have needed DME in the home. Wife at home to assist, but physically cannot lift at this time.   Will continue to follow for dc needs.    Expected Discharge Plan: IP Rehab Facility Barriers to Discharge: Continued Medical Work up   Patient Goals and CMS Choice            Expected Discharge Plan and Services   Discharge Planning Services: CM Consult Post Acute Care Choice: IP Rehab Living arrangements for the past 2 months: Single Family Home                                      Prior Living Arrangements/Services Living arrangements for the past 2 months: Single Family Home Lives with:: Spouse Patient language and need for interpreter reviewed:: Yes Do you feel safe going back to the place where you live?: Yes      Need for Family Participation in Patient Care: Yes (Comment) Care giver support system in place?: Yes (comment) Current home services: DME (rolling walker, wheelchair, bedside commode, shower chair, cane) Criminal Activity/Legal Involvement Pertinent to Current Situation/Hospitalization: No - Comment as needed  Activities of Daily Living   ADL Screening (condition at time of admission) Independently performs ADLs?: No Does the patient have a NEW difficulty with bathing/dressing/toileting/self-feeding that is expected to last >3 days?: Yes (Initiates electronic notice to provider for possible OT consult) Does the patient have a NEW difficulty with getting in/out of bed, walking, or climbing stairs that is expected to last >3 days?: No (sleeps in  chair) Does the patient have a NEW difficulty with communication that is expected to last >3 days?: No Is the patient deaf or have difficulty hearing?: No Does the patient have difficulty seeing, even when wearing glasses/contacts?: No Does the patient have difficulty concentrating, remembering, or making decisions?: Yes  Permission Sought/Granted Permission sought to share information with : Case Manager, Family Supports, PCP Permission granted to share information with : Yes, Verbal Permission Granted  Share Information with NAME: [name] Grullon     Permission granted to share info w Relationship: wife  Permission granted to share info w Contact Information: [phone]  Emotional Assessment Appearance:: Appears stated age Attitude/Demeanor/Rapport: Lethargic Affect (typically observed): Appropriate Orientation: : Oriented to Self, Oriented to Place, Oriented to  Time, Oriented to Situation   Psych Involvement: No (comment)  Admission diagnosis:  Pleural effusion [J90] Atrial fibrillation with RVR (HCC) [I48.91] Severe sepsis (HCC) [A41.9, R65.20] Sepsis due to pneumonia (HCC) [J18.9, A41.9] Acute hypoxemic respiratory failure (HCC) [J96.01] Empyema (HCC) [J86.9] Patient Active Problem List   Diagnosis Date Noted   Empyema (HCC) 05/09/2023   Pleural effusion on left 05/08/2023   Paroxysmal atrial fibrillation (HCC) 05/08/2023   Sepsis due to pneumonia (HCC) 05/07/2023   Symptomatic hypotension 12/28/2022   Anemia due to alcoholism (HCC) 06/13/2022   Tinnitus of both ears 12/08/2021   COPD (  chronic obstructive pulmonary disease) (HCC) 07/06/2021   Depression, major, single episode, moderate (HCC) 04/26/2021   Gastritis without bleeding    Atrial fibrillation and flutter (HCC)    CKD (chronic kidney disease) stage 3, GFR 30-59 ml/min (HCC)    Purpura senilis (HCC) 03/18/2020   Pulmonary nodule 07/14/2019   Ectatic thoracic aorta (HCC) 07/14/2019   Coronary artery  disease involving native coronary artery of native heart with angina pectoris [facility])    Aortic atherosclerosis (HCC) 03/07/2016   Mixed hyperlipidemia 01/14/2016   History of colonic polyps 10/21/2015   Essential hypertension 06/08/2014   Brain aneurysm 09/04/2013   Alcohol abuse 09/04/2013   PCP:  Babs Sciara, MD Pharmacy:   [facility] [phone] - [place] [place] - [address] [place] [address] Phone: [phone] Fax: [phone]     Social Drivers of Health (SDOH) Social History: SDOH Screenings   Food Insecurity: No Food Insecurity (05/08/2023)  Housing: Low Risk  (05/08/2023)  Transportation Needs: No Transportation Needs (05/08/2023)  Utilities: Not At Risk (05/08/2023)  Depression (PHQ2-9): Medium Risk (01/09/2023)  Social Connections: Moderately Integrated (05/08/2023)  Tobacco Use: High Risk (05/07/2023)   SDOH Interventions:     Readmission Risk Interventions    05/09/2023   10:27 AM 05/08/2023    9:28 AM  Readmission Risk Prevention Plan  Transportation Screening Complete Complete  Home Care Screening Complete Complete  Medication Review (RN CM) Complete Complete

## 2023-05-10 NOTE — Plan of Care (Signed)

## 2023-05-10 NOTE — Progress Notes (Signed)
 Had his first dose of pleural tPA/DNase this morning  Portable chest x-ray personally reviewed.  Rotated film.  Perhaps some marginal improvement in aeration on the left but really still with essential complete left opacification, again raising concern for airway obstruction and volume loss  Current pleural output since tPA: 450 ml with total of 1750 since CT insertion.   Started using [employer] to provide chest physiotherapy in addition to chest physiotherapy with the bed  He's already up in chair for his second time today   Wife updated earlier today.  See IPAL note

## 2023-05-10 NOTE — Procedures (Signed)
 Pleural Fibrinolytic Administration Procedure Note  Anthony Little  [id number]  [birth year]  Date:05/10/23  Time:6:06 PM   Provider Performing:Pete E Tanja Port   Procedure: Pleural Fibrinolysis Subsequent day (40981)  Indication(s) Fibrinolysis of complicated pleural effusion  Consent Risks of the procedure as well as the alternatives and risks of each were explained to the patient and/or caregiver.  Consent for the procedure was obtained.   Anesthesia None   Time Out Verified patient identification, verified procedure, site/side was marked, verified correct patient position, special equipment/implants available, medications/allergies/relevant history reviewed, required imaging and test results available.   Sterile Technique Hand hygiene, gloves   Procedure Description Existing pleural catheter was cleaned and accessed in sterile manner.  10mg  of tPA in 30cc of saline and 5mg  of dornase in 30cc of sterile water were injected into pleural space using existing pleural catheter.  Catheter will be clamped for 1 hour and then placed back to suction.   Complications/Tolerance None; patient tolerated the procedure well.  EBL None   Specimen(s) None

## 2023-05-10 NOTE — Procedures (Signed)
 Pleural Fibrinolytic Administration Procedure Note  Anthony Little  [id number]  1951/01/19  Date:05/10/23  Time:9:46 AM   Provider Performing:Pete E Tanja Port   Procedure: Pleural Fibrinolysis Subsequent day (40981)  Indication(s) Fibrinolysis of complicated pleural effusion  Consent Risks of the procedure as well as the alternatives and risks of each were explained to the patient and/or caregiver.  Consent for the procedure was obtained.   Anesthesia None   Time Out Verified patient identification, verified procedure, site/side was marked, verified correct patient position, special equipment/implants available, medications/allergies/relevant history reviewed, required imaging and test results available.   Sterile Technique Hand hygiene, gloves   Procedure Description Existing pleural catheter was cleaned and accessed in sterile manner.  10mg  of tPA in 30cc of saline and 5mg  of dornase in 30cc of sterile water were injected into pleural space using existing pleural catheter.  Catheter will be clamped for 1 hour and then placed back to suction.   Complications/Tolerance None; patient tolerated the procedure well.  EBL None   Specimen(s) None

## 2023-05-10 NOTE — Progress Notes (Signed)
 [facility]  Date of Admission:  05/07/2023       Abx: 2/27-c pcn g  Azith/ceftriaxone  ASSESSMENT: 73 yo male admitted in transfer from [facility] 2/26 for strep pna bacteremia complcated by left empyema/pna  Tte no obvious sign endocarditis  No meningitis or peripheral joint tenderness that is new; chronic mild left knee pain -- ambulatory   Getting tpa trial via left chest tube placed 2/26   PLAN: Change abx to penicillin g Continue tpa per pulm/ccm; hopefully can avoid vats Anticipate 4 weeks of abx   Principal Problem:   Sepsis due to pneumonia (HCC) Active Problems:   Mixed hyperlipidemia   COPD (chronic obstructive pulmonary disease) (HCC)   Pleural effusion on left   Paroxysmal atrial fibrillation (HCC)   Empyema (HCC)   Allergies  Allergen Reactions   Ivp Dye [Iodinated Contrast Media] Nausea And Vomiting    Scheduled Meds:  alteplase (CATHFLO ACTIVASE) 10 mg in sodium chloride (PF) 0.9 % 30 mL  10 mg Intrapleural Once   And   dornase alfa (PULMOZYME) 5 mg in sterile water (preservative free) 30 mL  5 mg Intrapleural Once   arformoterol  15 mcg Nebulization BID   budesonide (PULMICORT) nebulizer solution  0.5 mg Nebulization BID   Chlorhexidine Gluconate Cloth  6 each Topical Daily   dextromethorphan-guaiFENesin  1 tablet Oral BID   diltiazem  30 mg Oral Q6H   folic acid  1 mg Oral Daily   heparin injection (subcutaneous)  5,000 Units Subcutaneous Q8H   mouth rinse  15 mL Mouth Rinse 4 times per day   mouth rinse  15 mL Mouth Rinse 4 times per day   QUEtiapine  25 mg Oral QHS   revefenacin  175 mcg Nebulization Daily   rosuvastatin  10 mg Oral Daily   sertraline  50 mg Oral Daily   sodium chloride flush  10 mL Intrapleural Q8H   sodium chloride HYPERTONIC  4 mL Nebulization BID   Continuous Infusions:  lactated ringers 75 mL/hr at 05/10/23 1150   [START ON 05/11/2023] penicillin G potassium 9 Million Units in  dextrose 5 % 500 mL CONTINUOUS infusion     PRN Meds:.acetaminophen **OR** acetaminophen, fentaNYL (SUBLIMAZE) injection, [DISCONTINUED] ondansetron **OR** ondansetron (ZOFRAN) IV, mouth rinse, mouth rinse, oxyCODONE-acetaminophen   SUBJECTIVE: Pain left chest much better Afebrile No leukocytosis  Getting tpa   Review of Systems: ROS All other ROS was negative, except mentioned above     OBJECTIVE: Vitals:   05/10/23 1200 05/10/23 1300 05/10/23 1400 05/10/23 1500  BP: 100/71 105/72 101/88 (!) 118/99  Pulse: (!) 175 (!) 162 (!) 172 (!) 115  Resp: (!) 23 19 18  (!) 24  Temp:      TempSrc:      SpO2: 94% (!) 86% 96% (!) 86%  Weight:      Height:       Body mass index is 27.36 kg/m.  Physical Exam  General/constitutional: no distress, pleasant HEENT: Normocephalic, PER, Conj Clear, EOMI, Oropharynx clear Neck supple CV: rrr no mrg Lungs: normal respiratory effort; left chest tube functioning; chamber wit serosanguinous output; no airleak Abd: Soft, Nontender Ext: no edema Skin: No Rash Neuro: nonfocal MSK: no peripheral joint swelling/tenderness/warmth; back spines nontender  Lab Results Lab Results  Component Value Date   WBC 6.4 05/10/2023   HGB 11.4 (L) 05/10/2023   HCT 33.3 (L) 05/10/2023   MCV 102.1 (H) 05/10/2023  PLT 133 (L) 05/10/2023    Lab Results  Component Value Date   CREATININE 1.73 (H) 05/10/2023   BUN 37 (H) 05/10/2023   NA 133 (L) 05/10/2023   K 4.1 05/10/2023   CL 95 (L) 05/10/2023   CO2 23 05/10/2023    Lab Results  Component Value Date   ALT 52 (H) 05/10/2023   AST 177 (H) 05/10/2023   ALKPHOS 123 05/10/2023   BILITOT 1.2 05/10/2023      Microbiology: Recent Results (from the past 240 hours)  Resp panel by RT-PCR (RSV, Flu A&B, Covid) Anterior Nasal Swab     Status: None   Collection Time: 05/07/23  7:41 PM   Specimen: Anterior Nasal Swab  Result Value Ref Range Status   SARS Coronavirus 2 by RT PCR NEGATIVE NEGATIVE  Final    Comment: (NOTE) SARS-CoV-2 target nucleic acids are NOT DETECTED.  The SARS-CoV-2 RNA is generally detectable in upper respiratory specimens during the acute phase of infection. The lowest concentration of SARS-CoV-2 viral copies this assay can detect is 138 copies/mL. A negative result does not preclude SARS-Cov-2 infection and should not be used as the sole basis for treatment or other patient management decisions. A negative result may occur with  improper specimen collection/handling, submission of specimen other than nasopharyngeal swab, presence of viral mutation(s) within the areas targeted by this assay, and inadequate number of viral copies(<138 copies/mL). A negative result must be combined with clinical observations, patient history, and epidemiological information. The expected result is Negative.  Fact Sheet for Patients:  BloggerCourse.com  Fact Sheet for Healthcare Providers:  SeriousBroker.it  This test is no t yet approved or cleared by the [place] FDA and  has been authorized for detection and/or diagnosis of SARS-CoV-2 by FDA under an Emergency Use Authorization (EUA). This EUA will remain  in effect (meaning this test can be used) for the duration of the COVID-19 declaration under Section 564(b)(1) of the Act, 21 U.S.C.section 360bbb-3(b)(1), unless the authorization is terminated  or revoked sooner.       Influenza A by PCR NEGATIVE NEGATIVE Final   Influenza B by PCR NEGATIVE NEGATIVE Final    Comment: (NOTE) The Xpert Xpress SARS-CoV-2/FLU/RSV plus assay is intended as an aid in the diagnosis of influenza from Nasopharyngeal swab specimens and should not be used as a sole basis for treatment. Nasal washings and aspirates are unacceptable for Xpert Xpress SARS-CoV-2/FLU/RSV testing.  Fact Sheet for Patients: BloggerCourse.com  Fact Sheet for Healthcare  Providers: SeriousBroker.it  This test is not yet approved or cleared by the [place] FDA and has been authorized for detection and/or diagnosis of SARS-CoV-2 by FDA under an Emergency Use Authorization (EUA). This EUA will remain in effect (meaning this test can be used) for the duration of the COVID-19 declaration under Section 564(b)(1) of the Act, 21 U.S.C. section 360bbb-3(b)(1), unless the authorization is terminated or revoked.     Resp Syncytial Virus by PCR NEGATIVE NEGATIVE Final    Comment: (NOTE) Fact Sheet for Patients: BloggerCourse.com  Fact Sheet for Healthcare Providers: SeriousBroker.it  This test is not yet approved or cleared by the [place] FDA and has been authorized for detection and/or diagnosis of SARS-CoV-2 by FDA under an Emergency Use Authorization (EUA). This EUA will remain in effect (meaning this test can be used) for the duration of the COVID-19 declaration under Section 564(b)(1) of the Act, 21 U.S.C. section 360bbb-3(b)(1), unless the authorization is terminated or revoked.  Performed at  [facility], [address] [place], [place] [address]   Blood Culture (routine x 2)     Status: Abnormal   Collection Time: 05/07/23 10:40 PM   Specimen: BLOOD LEFT ARM  Result Value Ref Range Status   Specimen Description   Final    BLOOD LEFT ARM Performed at [facility], [address], [place], [place] [address]    Special Requests   Final    BOTTLES DRAWN AEROBIC AND ANAEROBIC Blood Culture adequate volume Performed at [facility], [address], [place], [place] [address]    Culture  Setup Time   Final    GRAM POSITIVE COCCI IN BOTH AEROBIC AND ANAEROBIC BOTTLES Gram Stain Report Called to,Read Back By and Verified With: Melvern Banker, RN AT 1041 05/08/23 BY Kandice Moos Performed at [facility], [address], [place], [place] [address]    Culture (A)   Final    STREPTOCOCCUS PNEUMONIAE SUSCEPTIBILITIES PERFORMED ON PREVIOUS CULTURE WITHIN THE LAST 5 DAYS. Performed at [facility], [address], [place], [place] [address]    Report Status 05/10/2023 FINAL  Final  Blood Culture (routine x 2)     Status: Abnormal   Collection Time: 05/07/23 10:42 PM   Specimen: BLOOD LEFT ARM  Result Value Ref Range Status   Specimen Description   Final    BLOOD LEFT ARM Performed at [facility], [address] [place], [place] [address]    Special Requests   Final    BOTTLES DRAWN AEROBIC AND ANAEROBIC Blood Culture adequate volume Performed at [facility], [address], [place], [place] [address]    Culture  Setup Time   Final    GRAM POSITIVE COCCI IN BOTH AEROBIC AND ANAEROBIC BOTTLES Gram Stain Report Called to,Read Back By and Verified With: EDGAR TINAJERO,RN AT 1041 05/08/23 BY A. SNYDER CRITICAL RESULT CALLED TO, READ BACK BY AND VERIFIED WITH: RN Marigene Ehlers [phone] @1912  FH Performed at [facility], [address] [place], [place] [address]    Culture STREPTOCOCCUS PNEUMONIAE (A)  Final   Report Status 05/10/2023 FINAL  Final   Organism ID, Bacteria STREPTOCOCCUS PNEUMONIAE  Final      Susceptibility   Streptococcus pneumoniae - MIC*    ERYTHROMYCIN <=0.12 SENSITIVE Sensitive     LEVOFLOXACIN 1 SENSITIVE Sensitive     VANCOMYCIN 0.5 SENSITIVE Sensitive     PENICILLIN (meningitis) <=0.06 SENSITIVE Sensitive     PENO - penicillin <=0.06      PENICILLIN (non-meningitis) <=0.06 SENSITIVE Sensitive     PENICILLIN (oral) <=0.06 SENSITIVE Sensitive     CEFTRIAXONE (non-meningitis) <=0.12 SENSITIVE Sensitive     CEFTRIAXONE (meningitis) <=0.12 SENSITIVE Sensitive     * STREPTOCOCCUS PNEUMONIAE  Blood Culture ID Panel (Reflexed)     Status: Abnormal   Collection Time: 05/07/23 10:42 PM  Result Value Ref Range Status   Enterococcus faecalis NOT DETECTED NOT DETECTED Final   Enterococcus Faecium NOT DETECTED NOT  DETECTED Final   Listeria monocytogenes NOT DETECTED NOT DETECTED Final   Staphylococcus species NOT DETECTED NOT DETECTED Final   Staphylococcus aureus (BCID) NOT DETECTED NOT DETECTED Final   Staphylococcus epidermidis NOT DETECTED NOT DETECTED Final   Staphylococcus lugdunensis NOT DETECTED NOT DETECTED Final   Streptococcus species DETECTED (A) NOT DETECTED Final    Comment: CRITICAL RESULT CALLED TO, READ BACK BY AND VERIFIED WITH: RN A. RONE [phone] @1912  FH`    Streptococcus agalactiae NOT DETECTED  NOT DETECTED Final   Streptococcus pneumoniae DETECTED (A) NOT DETECTED Final    Comment: CRITICAL RESULT CALLED TO, READ BACK BY AND VERIFIED WITH: RN A. RONE [phone] @1912  FH    Streptococcus pyogenes NOT DETECTED NOT DETECTED Final   A.calcoaceticus-baumannii NOT DETECTED NOT DETECTED Final   Bacteroides fragilis NOT DETECTED NOT DETECTED Final   Enterobacterales NOT DETECTED NOT DETECTED Final   Enterobacter cloacae complex NOT DETECTED NOT DETECTED Final   Escherichia coli NOT DETECTED NOT DETECTED Final   Klebsiella aerogenes NOT DETECTED NOT DETECTED Final   Klebsiella oxytoca NOT DETECTED NOT DETECTED Final   Klebsiella pneumoniae NOT DETECTED NOT DETECTED Final   Proteus species NOT DETECTED NOT DETECTED Final   Salmonella species NOT DETECTED NOT DETECTED Final   Serratia marcescens NOT DETECTED NOT DETECTED Final   Haemophilus influenzae NOT DETECTED NOT DETECTED Final   Neisseria meningitidis NOT DETECTED NOT DETECTED Final   Pseudomonas aeruginosa NOT DETECTED NOT DETECTED Final   Stenotrophomonas maltophilia NOT DETECTED NOT DETECTED Final   Candida albicans NOT DETECTED NOT DETECTED Final   Candida auris NOT DETECTED NOT DETECTED Final   Candida glabrata NOT DETECTED NOT DETECTED Final   Candida krusei NOT DETECTED NOT DETECTED Final   Candida parapsilosis NOT DETECTED NOT DETECTED Final   Candida tropicalis NOT DETECTED NOT DETECTED Final   Cryptococcus  neoformans/gattii NOT DETECTED NOT DETECTED Final    Comment: Performed at [facility], [address], [place], [place] [address]  MRSA Next Gen by PCR, Nasal     Status: None   Collection Time: 05/08/23 12:40 AM   Specimen: Nasal Mucosa; Nasal Swab  Result Value Ref Range Status   MRSA by PCR Next Gen NOT DETECTED NOT DETECTED Final    Comment: (NOTE) The GeneXpert MRSA Assay (FDA approved for NASAL specimens only), is one component of a comprehensive MRSA colonization surveillance program. It is not intended to diagnose MRSA infection nor to guide or monitor treatment for MRSA infections. Test performance is not FDA approved in patients less than 55 years old. Performed at [facility], [address] [place], [place] [address]   Culture, blood (Routine X 2) w Reflex to ID Panel     Status: None (Preliminary result)   Collection Time: 05/09/23  4:14 AM   Specimen: BLOOD  Result Value Ref Range Status   Specimen Description BLOOD BLOOD LEFT ARM  Final   Special Requests   Final    BOTTLES DRAWN AEROBIC AND ANAEROBIC Blood Culture adequate volume   Culture   Final    NO GROWTH 1 DAY Performed at [facility], [address] [place], [place] [address]    Report Status PENDING  Incomplete  Culture, blood (Routine X 2) w Reflex to ID Panel     Status: None (Preliminary result)   Collection Time: 05/09/23  4:16 AM   Specimen: BLOOD  Result Value Ref Range Status   Specimen Description BLOOD BLOOD LEFT HAND  Final   Special Requests   Final    BOTTLES DRAWN AEROBIC AND ANAEROBIC Blood Culture adequate volume   Culture   Final    NO GROWTH 1 DAY Performed at [facility], [address], [place], [place] [address]    Report Status PENDING  Incomplete  Gram stain     Status: None   Collection Time: 05/09/23 12:55 PM   Specimen: Pleura  Result Value Ref Range Status   Specimen Description PLEURAL  Final  Special Requests NONE  Final   Gram Stain   Final    PLEURAL GRAM  POSITIVE COCCI WBC PRESENT, PREDOMINANTLY PMN CYTOSPIN SMEAR Gram Stain Report Called to,Read Back By and Verified With: Quin Hoop, RN AT [phone] 05/09/23 BY Kandice Moos Performed at [facility], [address] [place], [place] [address]    Report Status 05/09/2023 FINAL  Final  Culture, body fluid w Gram Stain-bottle     Status: None (Preliminary result)   Collection Time: 05/09/23 12:55 PM   Specimen: Pleura  Result Value Ref Range Status   Specimen Description PLEURAL  Final   Special Requests BOTTLES DRAWN AEROBIC AND ANAEROBIC 10CC  Final   Culture   Final    NO GROWTH < 24 HOURS Performed at [facility], [address] [place], [place] [address]    Report Status PENDING  Incomplete     Serology:   Imaging: If present, new imagings (plain films, ct scans, and mri) have been personally visualized and interpreted; radiology reports have been reviewed. Decision making incorporated into the Impression / Recommendations.  2/27 cxr Stable position of left-sided chest tube. Continued near complete opacification of left hemithorax secondary to probable combination of pleural effusion and atelectasis with reduced amount of aerated lung compared to prior exam.  Raymondo Band, MD [facility] [phone] pager    05/10/2023, 4:06 PM

## 2023-05-10 NOTE — Telephone Encounter (Signed)
 Went to the hospital patient was transferred to [facility]

## 2023-05-10 NOTE — Progress Notes (Addendum)
 NAME:  Anthony Little, MRN:  [id number], DOB:  [birth year], LOS: 3 ADMISSION DATE:  05/07/2023, CONSULTATION DATE:  2/26 REFERRING MD:  Laural Benes, CHIEF COMPLAINT:  empyema, CAP and bacteremia   History of Present Illness:  73 year old male patient typically lives at home with his wife presented to [facility] on 2/24 with chief complaint of progressive shortness of breath, weakness, and cough.  He is typically ambulatory at home but does admit to being more sedentary over the winter. Noted he was starting to feel more short of breath around 2/20 with new cough, and intermittent subjective fever and worsening weakness.  In the emergency room he was found to be tachypneic, had a mild white blood cell count of 11.5, bicarbonate was 19 creatinine 1.54 respiratory viral panel was negative he had a large left loculated pleural effusion, He was admitted by the medical team started on IV ceftriaxone and azithromycin.  He was also found to be in atrial fibrillation with RVR and therefore was started on a Cardizem drip.  Cultures were sent, he was admitted to the intensive care subsequent evaluation is as follows: Blood cultures positive for strep pneumoniae, he underwent a left-sided thoracentesis yielding 1 L of fluid the Gram stain was positive for GPC, post chest x-ray still showing significant volume loss on the left side, he has now been referred to [facility] for sepsis, left-sided empyema, and pneumococcal bacteremia.  Infectious diseases already seen remotely he remains on antibiotics  Pertinent  Medical History  Accessible atrial fibrillation, typically on Eliquis and beta-blockade, hyperlipidemia, CKD stage IIIa, COPD, AAA, history of tobacco abuse, history of alcohol abuse. Raynauds   Significant Hospital Events: Including procedures, antibiotic start and stop dates in addition to other pertinent events   2/24 admitted to [facility], loculated effusion. Started on ABX. IVFs. Was in AF  w/ RVR.,started on CCB infusion.  2/25 increased WOB still on 3 lpm but had complete opacification of left hemithorax,. Lasix increased.  2/26 seen remotely in consult by ID for strep bacteremia. Felt bacteremia 2/2 CAP. Recommended drainage of pleural space, 5d azith and to cont ctx w/ f/u blood cultures for clearance. Underwent left thoracentesis. Drained 1 liter. Glucose 20, gm stain GPC. Transferred to [facility] for chest tube placement and additional support a left thoracostomy tube was placed on arrival 2/27 hemodynamics remained stable, chest tube output 1300 mL since insertion mild delirium during the at bedtime hours still requiring 10 L chest x-ray suggesting significant component of atelectasis receiving first dose of pleural lytic therapy  Interim History / Subjective:  [name] confused over the evening hours no distress no significant improvement in work of breathing and spite of putting out 1300 cc of pleural fluid  Objective   Blood pressure 105/87, pulse 94, temperature (!) 96.5 F (35.8 C), temperature source Axillary, resp. rate (!) 21, height 5\' 10"  (1.778 m), weight 86.5 kg, SpO2 98%.        Intake/Output Summary (Last 24 hours) at 05/10/2023 0846 Last data filed at 05/10/2023 0700 Gross per 24 hour  Intake 929.79 ml  Output 1660 ml  Net -730.21 ml   Filed Weights   05/08/23 0050  Weight: 86.5 kg    Examination: General 73 year old male patient lying in bed he is currently in no acute distress he still has some mild labored breathing and remains on 10 L oxygen HEENT normocephalic atraumatic no jugular venous distention Pulmonary: Continues to have crackles in the right base, remains fairly  diminished with asymmetric chest rise and decreased breath sounds on the left.  This is in spite of 1300 mL of pleural output after chest tube placement yesterday's portable chest x-ray personally reviewed by myself The chest tube remains in the pleural space, the right side fairly well  aerated, the left chest remains almost completely opacified with decreased aeration compared to imaging post chest tube placement the airway is shifted to the left suggesting mucous plugging and atelectasis Cardiac regular irregular with atrial fibrillation on telemetry he is still on a Cardizem infusion Abdomen soft nontender no organomegaly Extremities cool, fingers are cool and red, note history of prior Raynaud's disease pulses are palpable trace lower extremity edema Neuro awake, oriented x 2 currently, little more confused over the evening GU clear yellow  Resolved Hospital Problem list    Sepsis secondary to community-acquired pneumonia   Assessment & Plan:   acute hypoxic respiratory failure secondary to community-acquired pneumonia (pneumococcal) and empyema, as well as severe mucous plugging with left-sided atelectasis superimposed on Underlying COPD He is status post tube thoracostomy on 2/26 he has put out a total of 1300 from the chest tube since insertion Plan Continuing supplemental oxygen  Adding spirometry, and flutter  Adding inhaled hypertonic saline  I have asked respiratory to see if we can get CoughAssist  Get him out of bed  Continue his scheduled Brovana, budesonide, and Mikael Spray Will give him a dose of pleural thrombolytics today and follow chest tube output keeping it at 20 cm water suction continue to watch pleural output  Repeat chest x-ray later today to assess if any further recruitment.  I am reluctant to put him on BiPAP given poor cough, may need to consider elective intubation with bronchoscopy and a couple days of PEEP therapy for recruitment, would utilize this as a last resort Antibiotics as mentioned below Will also obtain a.m. chest x-ray Complete 5 days of azithromycin Currently day #4 ceftriaxone (see below)  pneumococcal  bacteremia -Seen by infectious disease, lactate was negative, all infectious parameters improving Plan Continue ceftriaxone,  follow-up blood cultures to assess for bacterial clearance; Length of therapy still to be determined Will obtain follow-up blood cultures today, may not need to have TEE if clearing per infectious disease consultation  Atrial fibrillation with RVR.   Has a history of known A-fib on a DOAC at baseline and beta-blocker. Echo w/ grossly intact EF from 25th. Plan Transition diltiazem to oral We will hold off on his beta-blocker Keep euvolemic Holding his systemic anticoagulation at this point given plan for pleural thrombolytic therapy, we are continuing DVT prophylaxis dosing Continue telemetry  Anion gap metabolic acidosis.  This is stable, his lactate was normal, I suspect a component of this is due to his underlying renal function, however he does appear malnourished so starvation ketosis is a possibility I guess  plan Continuing supportive care as above Checking beta hydroxybutyric acid  Delirium Improved during the daytime, but noted fairly significantly at night Plan Will add low-dose Seroquel at at bedtime Get him out of bed Encourage day night routine Frequent reorientation  Fluid and electrolyte imbalance: Hypokalemia and hyponatremia. Plan Reportedly replace potassium earlier today, unclear We will repeat chemistry now, and replace as indicated  Mild acute on chronic renal failure with CKD stage IIIa.  Serum creatinine increased a little from baseline Plan I am going to continue to hold his diuresis His p.o. intake has been poor, I am going to add some gentle IV hydration Will continue  to renal dose medications Strict intake output Repeat a.m. chemistry  Anemia.  No acute evidence of bleeding, his hemoglobin is currently stable Plan Will continue to monitor.  Trigger for transfusion is hemoglobin less than 7.. Serial lactatesBest Practice (right click and "Reselect all SmartList Selections" daily)   Diet/type: NPO w/ oral meds, depending on his chest x-ray later today  I may advance him to clears DVT prophylaxis prophylactic heparin  Pressure ulcer(s): N/A GI prophylaxis: N/A Lines: N/A Foley:  N/A Code Status:  full code Last date of multidisciplinary goals of care discussion [pendign]   Critical care time: 34 min

## 2023-05-11 ENCOUNTER — Inpatient Hospital Stay (HOSPITAL_COMMUNITY): Payer: Medicare Other

## 2023-05-11 DIAGNOSIS — I4891 Unspecified atrial fibrillation: Secondary | ICD-10-CM | POA: Diagnosis not present

## 2023-05-11 DIAGNOSIS — A419 Sepsis, unspecified organism: Secondary | ICD-10-CM | POA: Diagnosis not present

## 2023-05-11 DIAGNOSIS — J9601 Acute respiratory failure with hypoxia: Secondary | ICD-10-CM | POA: Diagnosis not present

## 2023-05-11 DIAGNOSIS — J189 Pneumonia, unspecified organism: Secondary | ICD-10-CM | POA: Diagnosis not present

## 2023-05-11 LAB — COMPREHENSIVE METABOLIC PANEL
ALT: 50 U/L — ABNORMAL HIGH (ref 0–44)
AST: 107 U/L — ABNORMAL HIGH (ref 15–41)
Albumin: <1.5 g/dL — ABNORMAL LOW (ref 3.5–5.0)
Alkaline Phosphatase: 116 U/L (ref 38–126)
Anion gap: 15 (ref 5–15)
BUN: 35 mg/dL — ABNORMAL HIGH (ref 8–23)
CO2: 19 mmol/L — ABNORMAL LOW (ref 22–32)
Calcium: 7.9 mg/dL — ABNORMAL LOW (ref 8.9–10.3)
Chloride: 98 mmol/L (ref 98–111)
Creatinine, Ser: 1.47 mg/dL — ABNORMAL HIGH (ref 0.61–1.24)
GFR, Estimated: 51 mL/min — ABNORMAL LOW (ref >60)
Glucose, Bld: 79 mg/dL (ref 70–99)
Potassium: 4.1 mmol/L (ref 3.5–5.1)
Sodium: 132 mmol/L — ABNORMAL LOW (ref 135–145)
Total Bilirubin: 0.6 mg/dL (ref 0.0–1.2)
Total Protein: 4.6 g/dL — ABNORMAL LOW (ref 6.5–8.1)

## 2023-05-11 LAB — CBC
HCT: 28.5 % — ABNORMAL LOW (ref 39.0–52.0)
Hemoglobin: 9.4 g/dL — ABNORMAL LOW (ref 13.0–17.0)
MCH: 34.3 pg — ABNORMAL HIGH (ref 26.0–34.0)
MCHC: 33 g/dL (ref 30.0–36.0)
MCV: 104 fL — ABNORMAL HIGH (ref 80.0–100.0)
Platelets: 105 10*3/uL — ABNORMAL LOW (ref 150–400)
RBC: 2.74 MIL/uL — ABNORMAL LOW (ref 4.22–5.81)
RDW: 13.3 % (ref 11.5–15.5)
WBC: 8.6 10*3/uL (ref 4.0–10.5)
nRBC: 0 % (ref 0.0–0.2)

## 2023-05-11 LAB — CYTOLOGY - NON PAP

## 2023-05-11 LAB — MAGNESIUM: Magnesium: 1.5 mg/dL — ABNORMAL LOW (ref 1.7–2.4)

## 2023-05-11 LAB — PROCALCITONIN: Procalcitonin: 1.86 ng/mL

## 2023-05-11 MED ORDER — FENTANYL CITRATE PF 50 MCG/ML IJ SOSY
50.0000 ug | PREFILLED_SYRINGE | Freq: Once | INTRAMUSCULAR | Status: AC
  Administered 2023-05-11: 50 ug via INTRAVENOUS
  Filled 2023-05-11: qty 1

## 2023-05-11 MED ORDER — POLYETHYLENE GLYCOL 3350 17 G PO PACK
17.0000 g | PACK | Freq: Every day | ORAL | Status: DC
  Administered 2023-05-12 – 2023-05-13 (×2): 17 g via ORAL
  Filled 2023-05-11 (×2): qty 1

## 2023-05-11 MED ORDER — MAGNESIUM SULFATE 4 GM/100ML IV SOLN
4.0000 g | Freq: Once | INTRAVENOUS | Status: AC
  Administered 2023-05-11: 4 g via INTRAVENOUS
  Filled 2023-05-11: qty 100

## 2023-05-11 MED ORDER — SENNOSIDES-DOCUSATE SODIUM 8.6-50 MG PO TABS
1.0000 | ORAL_TABLET | Freq: Every day | ORAL | Status: DC
  Administered 2023-05-12 – 2023-05-13 (×3): 1 via ORAL
  Filled 2023-05-11 (×2): qty 1

## 2023-05-11 NOTE — Procedures (Signed)
 Insertion of Chest Tube Procedure Note  Anthony Little  [id number]  Jan 06, 1951  Date:05/11/23  Time:2:52 PM    Provider Performing: Shelby Mattocks   Procedure: Chest Tube Insertion (32551)  Indication(s) Effusion  Consent Risks of the procedure as well as the alternatives and risks of each were explained to the patient and/or caregiver.  Consent for the procedure was obtained and is signed in the bedside chart  Anesthesia Topical only with 1% lidocaine    Time Out Verified patient identification, verified procedure, site/side was marked, verified correct patient position, special equipment/implants available, medications/allergies/relevant history reviewed, required imaging and test results available.   Sterile Technique Maximal sterile technique including full sterile barrier drape, hand hygiene, sterile gown, sterile gloves, mask, hair covering, sterile ultrasound probe cover (if used).   Procedure Description Ultrasound used to identify appropriate pleural anatomy for placement and overlying skin marked. Area of placement cleaned and draped in sterile fashion.  A 14 French pigtail pleural catheter was placed into the left pleural space using Seldinger technique. Appropriate return of pus and air was obtained.  The tube was connected to atrium and placed on -20 cm H2O wall suction.   Complications/Tolerance None; patient tolerated the procedure well. Chest X-ray is ordered to verify placement.   EBL Minimal  Specimen(s) none

## 2023-05-11 NOTE — Progress Notes (Signed)
 OT Cancellation Note  Patient Details Name: Anthony Little MRN: [id number] DOB: Jul 04, 1950   Cancelled Treatment:    Reason Eval/Treat Not Completed: Patient at procedure or test/ unavailable (Pt receiving chest tube placement.). Will continue to follow.   Evern Bio 05/11/2023, 2:36 PM Berna Spare, OTR/L Acute Rehabilitation Services Office: [phone]

## 2023-05-11 NOTE — Progress Notes (Signed)
 PT Cancellation Note  Patient Details Name: Anthony Little MRN: [id number] DOB: May 31, 1950   Cancelled Treatment:    Reason Eval/Treat Not Completed: Patient at procedure or test/unavailable (pt leaving room for CT)   Jerelene Salaam B Jaylenne Hamelin 05/11/2023, 10:57 AM Merryl Hacker, PT Acute Rehabilitation Services Office: [phone]

## 2023-05-11 NOTE — TOC Initial Note (Signed)
 Transition of Care [facility]) - Initial/Assessment Note    Patient Details  Name: Anthony Little MRN: [id number] Date of Birth: 01-27-1951  [facility] CM/SW Contact:    Delilah Shan, LCSWA Phone Number: 05/11/2023, 3:21 PM  Clinical Narrative:                  CSW received consult for possible SNF placement at time of discharge. Due to patients current orientation CSW spoke with patients spouse regarding PT recommendation of SNF placement at time of discharge. Patients spouse reports PTA patient comes from home with her. Patients spouse expressed understanding of PT recommendation and is agreeable to SNF placement for patient at time of discharge. CSW discussed insurance authorization process. No further questions reported at this time. CSW to continue to follow and assist with discharge planning needs.   Expected Discharge Plan: Skilled Nursing Facility Barriers to Discharge: Continued Medical Work up   Patient Goals and CMS Choice Patient states their goals for this hospitalization and ongoing recovery are:: SNF   Choice offered to / list presented to : Spouse      Expected Discharge Plan and Services In-house Referral: Clinical Social Work Discharge Planning Services: CM Consult Post Acute Care Choice: IP Rehab Living arrangements for the past 2 months: Single Family Home                                      Prior Living Arrangements/Services Living arrangements for the past 2 months: Single Family Home Lives with:: Spouse Patient language and need for interpreter reviewed:: Yes Do you feel safe going back to the place where you live?: No   SNF  Need for Family Participation in Patient Care: Yes (Comment) Care giver support system in place?: Yes (comment) Current home services: DME (rolling walker, wheelchair, bedside commode, shower chair, cane) Criminal Activity/Legal Involvement Pertinent to Current Situation/Hospitalization: No - Comment as  needed  Activities of Daily Living   ADL Screening (condition at time of admission) Independently performs ADLs?: No Does the patient have a NEW difficulty with bathing/dressing/toileting/self-feeding that is expected to last >3 days?: Yes (Initiates electronic notice to provider for possible OT consult) Does the patient have a NEW difficulty with getting in/out of bed, walking, or climbing stairs that is expected to last >3 days?: No (sleeps in chair) Does the patient have a NEW difficulty with communication that is expected to last >3 days?: No Is the patient deaf or have difficulty hearing?: No Does the patient have difficulty seeing, even when wearing glasses/contacts?: No Does the patient have difficulty concentrating, remembering, or making decisions?: Yes  Permission Sought/Granted Permission sought to share information with : Case Manager, Magazine features editor, Family Supports Permission granted to share information with : Yes, Verbal Permission Granted  Share Information with NAME: [name] Marshman     Permission granted to share info w Relationship: wife  Permission granted to share info w Contact Information: [phone]  Emotional Assessment Appearance:: Appears stated age Attitude/Demeanor/Rapport: Lethargic Affect (typically observed): Appropriate Orientation: : Oriented to Self, Oriented to Place, Oriented to  Time, Oriented to Situation Alcohol / Substance Use: Not Applicable Psych Involvement: No (comment)  Admission diagnosis:  Pleural effusion [J90] Atrial fibrillation with RVR (HCC) [I48.91] Severe sepsis (HCC) [A41.9, R65.20] Sepsis due to pneumonia (HCC) [J18.9, A41.9] Acute hypoxemic respiratory failure (HCC) [J96.01] Empyema (HCC) [J86.9] Patient Active Problem List   Diagnosis  Date Noted   Acute respiratory failure with hypoxia (HCC) 05/10/2023   Pneumonia of left lung due to Streptococcus pneumoniae (HCC) 05/10/2023   Empyema (HCC)  05/09/2023   Pleural effusion on left 05/08/2023   Paroxysmal atrial fibrillation (HCC) 05/08/2023   Sepsis due to pneumonia (HCC) 05/07/2023   Symptomatic hypotension 12/28/2022   Anemia due to alcoholism (HCC) 06/13/2022   Tinnitus of both ears 12/08/2021   COPD (chronic obstructive pulmonary disease) (HCC) 07/06/2021   Depression, major, single episode, moderate (HCC) 04/26/2021   Gastritis without bleeding    Atrial fibrillation and flutter (HCC)    CKD (chronic kidney disease) stage 3, GFR 30-59 ml/min (HCC)    Purpura senilis (HCC) 03/18/2020   Pulmonary nodule 07/14/2019   Ectatic thoracic aorta (HCC) 07/14/2019   Coronary artery disease involving native coronary artery of native heart with angina pectoris (HCC)    Aortic atherosclerosis (HCC) 03/07/2016   Mixed hyperlipidemia 01/14/2016   History of colonic polyps 10/21/2015   Essential hypertension 06/08/2014   Brain aneurysm 09/04/2013   Alcohol abuse 09/04/2013   PCP:  Babs Sciara, MD Pharmacy:   [facility] [phone] - [place], [place] - [address] AT [facility] [address] [place] [address] Phone: [phone] Fax: [phone]     Social Drivers of Health (SDOH) Social History: SDOH Screenings   Food Insecurity: No Food Insecurity (05/08/2023)  Housing: Low Risk  (05/08/2023)  Transportation Needs: No Transportation Needs (05/08/2023)  Utilities: Not At Risk (05/08/2023)  Depression (PHQ2-9): Medium Risk (01/09/2023)  Social Connections: Moderately Integrated (05/08/2023)  Tobacco Use: High Risk (05/07/2023)   SDOH Interventions:     Readmission Risk Interventions    05/09/2023   10:27 AM 05/08/2023    9:28 AM  Readmission Risk Prevention Plan  Transportation Screening Complete Complete  Home Care Screening Complete Complete  Medication Review (RN CM) Complete Complete

## 2023-05-11 NOTE — Evaluation (Signed)
 Physical Therapy Evaluation Patient Details Name: Anthony Little MRN: [id number] DOB: 1950/08/27 Today's Date: 05/11/2023  History of Present Illness  73 yo male admitted to [facility] 2/24 with SOB, cough, pleural effusion, Afib with RVR. 2/26 thoracentesis and transfer to [facility] for chest tube placement PMHx: ETOH abuse, HTN, CAD Afib, CKD, depression, tinnitus  Clinical Impression  Pt pleasant with disjointed thoughts at times and difficulty with consistent responses of PLOF. Pt does state he was caring for wife at home and was independent. Pt currently with decreased transfers, gait and activity tolerance requiring 6L for gait and 4L at rest who will benefit from acute therapy to maximize mobility, safety and function to decrease burden of care.        If plan is discharge home, recommend the following: [name] help with walking and/or transfers;[name] help with bathing/dressing/bathroom;Assistance with cooking/housework;Assist for transportation;Help with stairs or ramp for entrance   Can travel by private vehicle   Yes    Equipment Recommendations Rolling walker (2 wheels);BSC/3in1  Recommendations for Other Services  OT consult    Functional Status Assessment Patient has had a recent decline in their functional status and demonstrates the ability to make significant improvements in function in a reasonable and predictable amount of time.     Precautions / Restrictions Precautions Precautions: Fall;Other (comment) Recall of Precautions/Restrictions: Impaired Precaution/Restrictions Comments: chest tube, watch SPO2      Mobility  Bed Mobility Overal bed mobility: Needs Assistance Bed Mobility: Supine to Sit, Sit to Supine     Supine to sit: Min assist Sit to supine: Min assist   General bed mobility comments: cues for sequence with assist for lines, assist to lift trunk from surface and assist to return legs to bed    Transfers Overall transfer level: Needs  assistance   Transfers: Sit to/from Stand Sit to Stand: Contact guard assist           General transfer comment: cues for hand placement, sequence and safety    Ambulation/Gait Ambulation/Gait assistance: Contact guard assist, +2 safety/equipment Gait Distance (Feet): 20 Feet Assistive device: Rolling walker (2 wheels) Gait Pattern/deviations: Step-through pattern   Gait velocity interpretation: <1.31 ft/sec, indicative of household ambulator   General Gait Details: pt walked 10', 20', 20' respectively over 3 trials with pt needing to sit quickly when fatigued and chair pulled behind pt. Pt tolerating gait with SPO2 >90% on 1st and last trial at 6L brief drop to 86% during 2nd trial which recovered with seated rest  Stairs            Wheelchair Mobility     Tilt Bed    Modified Rankin (Stroke Patients Only)       Balance Overall balance assessment: Needs assistance Sitting-balance support: No upper extremity supported, Feet supported Sitting balance-Leahy Scale: Fair     Standing balance support: Bilateral upper extremity supported, During functional activity, Reliant on assistive device for balance Standing balance-Leahy Scale: Poor Standing balance comment: Rw for gait                             Pertinent Vitals/Pain Pain Assessment Pain Assessment: No/denies pain    Home Living Family/patient expects to be discharged to:: Private residence Living Arrangements: Spouse/significant other Available Help at Discharge: Available 24 hours/day Type of Home: House Home Access: Stairs to enter   [employer] of Steps: 2   Home Layout: One level Home Equipment: Shower  seat - built in      Prior Function Prior Level of Function : Independent/Modified Independent             Mobility Comments: reports he was independent and assisting wife after a recent pelvic fx       Extremity/Trunk Assessment   Upper Extremity  Assessment Upper Extremity Assessment: Overall WFL for tasks assessed    Lower Extremity Assessment Lower Extremity Assessment: Overall WFL for tasks assessed    Cervical / Trunk Assessment Cervical / Trunk Assessment: Normal  Communication   Communication Communication: No apparent difficulties    Cognition Arousal: Alert Behavior During Therapy: WFL for tasks assessed/performed   PT - Cognitive impairments: No family/caregiver present to determine baseline, Memory, Attention, Problem solving                         Following commands: Intact       Cueing Cueing Techniques: Verbal cues     General Comments      Exercises     Assessment/Plan    PT Assessment Patient needs continued PT services  PT Problem List Decreased activity tolerance;Decreased balance;Decreased mobility;Decreased safety awareness;Decreased knowledge of use of DME;Decreased cognition;Cardiopulmonary status limiting activity       PT Treatment Interventions DME instruction;Balance training;Gait training;Stair training;Therapeutic exercise;Therapeutic activities;Functional mobility training;Patient/family education;Cognitive remediation    PT Goals (Current goals can be found in the Care Plan section)  Acute Rehab PT Goals Patient Stated Goal: return home PT Goal Formulation: With patient Time For Goal Achievement: 05/25/23 Potential to Achieve Goals: Good    Frequency Min 1X/week     Co-evaluation               AM-PAC PT "6 Clicks" Mobility  Outcome Measure Help needed turning from your back to your side while in a flat bed without using bedrails?: [name] Help needed moving from lying on your back to sitting on the side of a flat bed without using bedrails?: [name] Help needed moving to and from a bed to a chair (including a wheelchair)?: [name] Help needed standing up from a chair using your arms (e.g., wheelchair or bedside chair)?: [name] Help needed to walk  in hospital room?: A Lot Help needed climbing 3-5 steps with a railing? : Total 6 Click Score: 15    End of Session   Activity Tolerance: Patient tolerated treatment well Patient left: in bed;with call bell/phone within reach (returned to bed for chest tube removal) Nurse Communication: Mobility status;Precautions PT Visit Diagnosis: Other abnormalities of gait and mobility (R26.89);Difficulty in walking, not elsewhere classified (R26.2)    Time: 1232-1300 PT Time Calculation (min) (ACUTE ONLY): 28 min   Charges:   PT Evaluation $PT Eval Moderate Complexity: 1 Mod PT Treatments $Gait Training: 8-22 mins PT General Charges $$ ACUTE PT VISIT: 1 Visit         Merryl Hacker, PT Acute Rehabilitation Services Office: [phone]   Enedina Finner [place] Nordmeyer 05/11/2023, 1:47 PM

## 2023-05-11 NOTE — [place] FL2 (Signed)
 [place] MEDICAID FL2 LEVEL OF CARE FORM     IDENTIFICATION  Patient Name: Anthony Little Birthdate: 1950/10/26 Sex: male Admission Date (Current Location): 05/07/2023  [facility] and [place] Number:  Producer, television/film/video and Address:  [facility] [place], [address], [place], [place] [address]      Provider Number: [id number]  Attending Physician Name and Address:  Cheri Fowler, MD  Relative Name and Phone Number:  [name] (spouse) [phone]    Current Level of Care: Hospital Recommended Level of Care: Skilled Nursing Facility Prior Approval Number:    Date Approved/Denied:   PASRR Number: PASSR under review  Discharge Plan: SNF    Current Diagnoses: Patient Active Problem List   Diagnosis Date Noted   Acute respiratory failure with hypoxia (HCC) 05/10/2023   Pneumonia of left lung due to Streptococcus pneumoniae (HCC) 05/10/2023   Empyema (HCC) 05/09/2023   Pleural effusion on left 05/08/2023   Paroxysmal atrial fibrillation (HCC) 05/08/2023   Sepsis due to pneumonia (HCC) 05/07/2023   Symptomatic hypotension 12/28/2022   Anemia due to alcoholism (HCC) 06/13/2022   Tinnitus of both ears 12/08/2021   COPD (chronic obstructive pulmonary disease) (HCC) 07/06/2021   Depression, major, single episode, moderate (HCC) 04/26/2021   Gastritis without bleeding    Atrial fibrillation and flutter (HCC)    CKD (chronic kidney disease) stage 3, GFR 30-59 ml/min (HCC)    Purpura senilis (HCC) 03/18/2020   Pulmonary nodule 07/14/2019   Ectatic thoracic aorta (HCC) 07/14/2019   Coronary artery disease involving native coronary artery of native heart with angina pectoris (HCC)    Aortic atherosclerosis (HCC) 03/07/2016   Mixed hyperlipidemia 01/14/2016   History of colonic polyps 10/21/2015   Essential hypertension 06/08/2014   Brain aneurysm 09/04/2013   Alcohol abuse 09/04/2013    Orientation RESPIRATION BLADDER Height & Weight     Self, Time,  Situation, Place  O2 (Nasal Cannula 6 liters) Continent, External catheter (External Urinary Catheter) Weight: 190 lb 11.2 oz (86.5 kg) Height:  5\' 10"  (177.8 cm)  BEHAVIORAL SYMPTOMS/MOOD NEUROLOGICAL BOWEL NUTRITION STATUS        Diet (Please see discharge summary)  AMBULATORY STATUS COMMUNICATION OF NEEDS Skin   Limited Assist Verbally Other (Comment) (Abrasion,Arm,Bil.,Ecchymosis,arm,Leg,Bil.,Scratch marks,arm,chest,L,upper,Wound/Incision LDAs)                       Personal Care Assistance Level of Assistance  Bathing, Feeding, Dressing Bathing Assistance: Limited assistance Feeding assistance: Independent Dressing Assistance: Limited assistance     Functional Limitations Info  Hearing, Speech   Hearing Info: Adequate Speech Info: Adequate    SPECIAL CARE FACTORS FREQUENCY  PT (By licensed PT), OT (By licensed OT)     PT Frequency: 5x min weekly OT Frequency: 5x min weekly            Contractures Contractures Info: Not present    Additional Factors Info  Code Status, Allergies, Psychotropic Code Status Info: DNR Allergies Info: Ivp Dye (iodinated Contrast Media) Psychotropic Info: QUEtiapine (SEROQUEL) tablet 25 mg daily at bedtime,  sertraline (ZOLOFT) tablet 50 mg daily         Current Medications (05/11/2023):  This is the current hospital active medication list Current Facility-Administered Medications  Medication Dose Route Frequency Provider Last Rate Last Admin   acetaminophen (TYLENOL) tablet 650 mg  650 mg Oral Q6H PRN Simonne Martinet, NP   650 mg at 05/10/23 1141   Or   acetaminophen (TYLENOL)  suppository 650 mg  650 mg Rectal Q6H PRN Simonne Martinet, NP       arformoterol [facility]) nebulizer solution 15 mcg  15 mcg Nebulization BID Simonne Martinet, NP   15 mcg at 05/11/23 0741   budesonide (PULMICORT) nebulizer solution 0.5 mg  0.5 mg Nebulization BID Simonne Martinet, NP   0.5 mg at 05/11/23 6045   Chlorhexidine Gluconate Cloth 2 % PADS 6  each  6 each Topical Daily Steffanie Dunn, DO   6 each at 05/10/23 1000   dextromethorphan-guaiFENesin (MUCINEX DM) 30-600 MG per 12 hr tablet 1 tablet  1 tablet Oral BID Simonne Martinet, NP   1 tablet at 05/11/23 1005   diltiazem (CARDIZEM) tablet 30 mg  30 mg Oral Q6H Simonne Martinet, NP   30 mg at 05/11/23 1006   fentaNYL (SUBLIMAZE) injection 25 mcg  25 mcg Intravenous Q2H PRN Simonne Martinet, NP       folic acid (FOLVITE) tablet 1 mg  1 mg Oral Daily Simonne Martinet, NP   1 mg at 05/11/23 1005   heparin injection 5,000 Units  5,000 Units Subcutaneous Q8H Simonne Martinet, NP   5,000 Units at 05/11/23 1447   ondansetron (ZOFRAN) injection 4 mg  4 mg Intravenous Q6H PRN Simonne Martinet, NP       Oral care mouth rinse  15 mL Mouth Rinse 4 times per day Simonne Martinet, NP   15 mL at 05/10/23 2314   Oral care mouth rinse  15 mL Mouth Rinse PRN Simonne Martinet, NP       Oral care mouth rinse  15 mL Mouth Rinse 4 times per day Steffanie Dunn, DO       Oral care mouth rinse  15 mL Mouth Rinse PRN Steffanie Dunn, DO       oxyCODONE-acetaminophen (PERCOCET/ROXICET) 5-325 MG per tablet 1-2 tablet  1-2 tablet Oral Q6H PRN Simonne Martinet, NP   2 tablet at 05/11/23 1004   penicillin G potassium 9 Million Units in dextrose 5 % 500 mL CONTINUOUS infusion  9 Million Units Intravenous Q12H Vu, Trung T, MD 41.7 mL/hr at 05/11/23 1000 Infusion Verify at 05/11/23 1000   polyethylene glycol (MIRALAX / GLYCOLAX) packet 17 g  17 g Oral Daily Simonne Martinet, NP       QUEtiapine (SEROQUEL) tablet 25 mg  25 mg Oral QHS Simonne Martinet, NP       revefenacin (YUPELRI) nebulizer solution 175 mcg  175 mcg Nebulization Daily Simonne Martinet, NP   175 mcg at 05/11/23 0741   rosuvastatin (CRESTOR) tablet 10 mg  10 mg Oral Daily Simonne Martinet, NP   10 mg at 05/11/23 1004   senna-docusate (Senokot-S) tablet 1 tablet  1 tablet Oral Daily Simonne Martinet, NP       sertraline (ZOLOFT) tablet 50 mg  50 mg Oral  Daily Simonne Martinet, NP   50 mg at 05/11/23 1005   sodium chloride flush (NS) 0.9 % injection 10 mL  10 mL Intrapleural Q8H Simonne Martinet, NP   10 mL at 05/11/23 0307   sodium chloride HYPERTONIC 3 % nebulizer solution 4 mL  4 mL Nebulization BID Simonne Martinet, NP   4 mL at 05/11/23 4098     Discharge Medications: Please see discharge summary for a list of discharge medications.  Relevant Imaging Results:  Relevant Lab Results:   Additional Information  [id number]  Delilah Shan, LCSWA

## 2023-05-11 NOTE — Progress Notes (Addendum)
 NAME:  Anthony Little, MRN:  [id number], DOB:  08/04/1950, LOS: 4 ADMISSION DATE:  05/07/2023, CONSULTATION DATE:  2/26 REFERRING MD:  Laural Benes, CHIEF COMPLAINT:  empyema, CAP and bacteremia   History of Present Illness:  73 year old male patient typically lives at home with his wife presented to [facility] on 2/24 with chief complaint of progressive shortness of breath, weakness, and cough.  He is typically ambulatory at home but does admit to being more sedentary over the winter. Noted he was starting to feel more short of breath around 2/20 with new cough, and intermittent subjective fever and worsening weakness.  In the emergency room he was found to be tachypneic, had a mild white blood cell count of 11.5, bicarbonate was 19 creatinine 1.54 respiratory viral panel was negative he had a large left loculated pleural effusion, He was admitted by the medical team started on IV ceftriaxone and azithromycin.  He was also found to be in atrial fibrillation with RVR and therefore was started on a Cardizem drip.  Cultures were sent, he was admitted to the intensive care subsequent evaluation is as follows: Blood cultures positive for strep pneumoniae, he underwent a left-sided thoracentesis yielding 1 L of fluid the Gram stain was positive for GPC, post chest x-ray still showing significant volume loss on the left side, he has now been referred to [facility] for sepsis, left-sided empyema, and pneumococcal bacteremia.  Infectious diseases already seen remotely he remains on antibiotics  Pertinent  Medical History  Accessible atrial fibrillation, typically on Eliquis and beta-blockade, hyperlipidemia, CKD stage IIIa, COPD, AAA, history of tobacco abuse, history of alcohol abuse. Raynauds   Significant Hospital Events: Including procedures, antibiotic start and stop dates in addition to other pertinent events   2/24 admitted to [facility] PNA, loculated effusion. Started on ABX. IVFs. Was in AF  w/ RVR.,started on CCB infusion.  2/25 increased WOB still on 3 lpm but had complete opacification of left hemithorax,. Lasix increased.  2/26 seen remotely in consult by ID for strep bacteremia. Felt bacteremia 2/2 CAP. Recommended drainage of pleural space, 5d azith and to cont ctx w/ f/u blood cultures for clearance. Underwent left thoracentesis. Drained 1 liter. Glucose 20, gm stain GPC. Transferred to [facility] for chest tube placement and additional support a left thoracostomy tube was placed on arrival 2/27 hemodynamics remained stable, chest tube output 1300 mL since insertion mild delirium during the at bedtime hours still requiring 10 L chest x-ray suggesting significant component of atelectasis receiving first dose of pleural lytic therapy, got a second dose of pleural lytics only put out about 90 cc.   2/28 down to 8 L.  Chest x-ray still with marked volume loss.  A total of 1850 output pleural fluid since chest tube insertion, getting CT of chest  Interim History / Subjective:  No distress today  Objective   Blood pressure 112/67, pulse 75, temperature (!) 96.6 F (35.9 C), temperature source Axillary, resp. rate 12, height 5\' 10"  (1.778 m), weight 86.5 kg, SpO2 99%.        Intake/Output Summary (Last 24 hours) at 05/11/2023 6295 Last data filed at 05/11/2023 0700 Gross per 24 hour  Intake 1991.68 ml  Output 1032 ml  Net 959.68 ml   Filed Weights   05/08/23 0050  Weight: 86.5 kg    Examination: General: Lying in bed no acute distress HEENT normocephalic atraumatic mucous membranes are moist today no JVD Pulmonary: Clear on the right, very diminished on  the left still, perhaps a little better air movement comparing prior exams.  Currently on 8 L/min still speaking in 2-3 word phrases portable chest x-ray personally reviewed this shows perhaps a little bit of improvement in aeration on the left but still marked volume loss.  The chest tube appears to have migrated a little. Chest  tube: No airleak.  He put out 90 cc of fluid over the last 12 hours Cardiac irregular regular atrial fibrillation with controlled ventricular rhythm Abdomen soft not tender no organomegaly he is tolerating clear liquids well Extremities warm trace lower extremity edema he has Raynaud's, and associated discoloration of his fingers.  Pulses are strong Neuro: Awake alert, oriented but intermittently confused generalized weakness persists GU, voids.  Resolved Hospital Problem list    Sepsis secondary to community-acquired pneumonia   Assessment & Plan:   acute hypoxic respiratory failure secondary to community-acquired pneumonia (pneumococcal) and empyema, as well as severe mucous plugging with left-sided atelectasis superimposed on Underlying COPD He is status post tube thoracostomy on 2/26 he has put out a total of 1840 mL, he had pleural lytics twice on 2/28 his initial response was fairly good with over 400 mL, put out only 90 cc after second dose.  Still with marked volume loss becoming more concerned that this is more complicated than simply mucous plugging Plan Continuing supplemental oxygen, is down to 8 L from 10 Continue spirometry, flutter, and MetaNeb Continue inhaled hypertonic saline  Out of bed at least twice daily Continue his scheduled Brovana, budesonide, and Yupelri Keep chest tube to suction, we will obtain noncontrasted CT this morning to better evaluate pleural fluid, also looks like the chest tube is moved a little so getting a better idea of placement helpful Right on further pleural thrombolytics pending CT [name] concerned his volume loss pain be more complicated than mucous plugging i.e. worried about a mass Antibiotics as mentioned below Currently day #5 abx (see below) F/u pleural cytology from 26th and the repeat from 28th  pneumococcal  bacteremia -Seen by infectious disease, lactate was negative, all infectious parameters improving Plan Continue PCN-G-K,  follow-up blood cultures to assess for bacterial clearance; Length of therapy still to be determined Follow-up cultures were sent on the 26th Additional recommended patients to be determined additional recommendations to be determined by infectious disease  Atrial fibrillation with RVR.   Has a history of known A-fib on a DOAC at baseline and beta-blocker. Echo w/ grossly intact EF from 25th. Plan Continue telemetry monitoring Continue calcium channel blocker Open holding his home metoprolol Holding home anticoagulation as he has been getting pleural thrombolytic therapy  Delirium Improved during the daytime, but noted fairly significantly at night Plan Pain control Out of bed as tolerated, at least twice a day Continue nocturnal Seroquel Frequent reorientation  Fluid and electrolyte imbalance: Hyponatremia, borderline anion gap metabolic acidosis.  Lactates have been negative I do not see worsening shock state or hemodynamics that support that Plan Continuing supportive care Monitor chemistries Replace lites as needed Was going to check her beta hydroxybutyric acid yesterday does not look like that happened will check today  Mild acute on chronic renal failure with CKD stage IIIa.  Serum creatinine seems to be improving with addition of gentle IV hydration, he is now taking orals Plan We can saline lock his IV Encourage oral intake Renal dose medications Continue to hold diuretics Strict intake output A.m. chemistry  Mild thrombocytopenia Plan Monitor Anemia.  Hemoglobin trending down, he has  some blood tinge/bloody pleural output but I do not think this reflects pleural hemorrhage more likely hemodilution after adding IV hydration  plan Will continue to monitor.  Trigger for transfusion is hemoglobin less than 7.. Serial lactatesBest Practice (right click and "Reselect all SmartList Selections" daily)   Diet/type: clear liquids,  DVT prophylaxis prophylactic heparin   Pressure ulcer(s): N/A GI prophylaxis: N/A Lines: N/A Foley:  N/A Code Status:  DNR Last date of multidisciplinary goals of care discussion [pendign]   Critical care time: 33 min

## 2023-05-11 NOTE — Progress Notes (Addendum)
 RE: Anthony Little  Date of Birth: 1950-08-09  Date: 05/11/2023    To Whom It May Concern:   Please be advised that the above-named patient will require a short-term nursing home stay - anticipated 30 days or less for rehabilitation and strengthening. The plan is for return home.

## 2023-05-11 NOTE — Progress Notes (Signed)
 New CT in. Drained about 500 ml. Does have sig airleak. This occurred after removal of fluid. I suspect this reflects trapped lung as he has had the effusion for some time.   I have reviewed the new CXR. Aeration much improved but there is sig loculated  left basilar PTX.  Plan Cont CT to sxn-->I've changed it to 30 cmH2O (do not remove fm sxn ) Cont abx. ID has since changed to PCN G Am cxr Will hold off on pleural TPA/DNase Pain rx Will move to pulm progressive

## 2023-05-11 NOTE — Progress Notes (Signed)
 [facility] ADULT ICU REPLACEMENT PROTOCOL   The patient does apply for the [facility] Adult ICU Electrolyte Replacment Protocol based on the criteria listed below:   1.Exclusion criteria: TCTS, ECMO, Dialysis, and Myasthenia Gravis patients 2. Is GFR >/= 30 ml/min? Yes.    Patient's GFR today is 51 3. Is SCr </= 2? Yes.   Patient's SCr is 1.47 mg/dL 4. Did SCr increase >/= 0.5 in 24 hours? No. 5.Pt's weight >40kg  Yes.   6. Abnormal electrolyte(s): mag 1.5  7. Electrolytes replaced per protocol 8.  Call MD STAT for K+ </= 2.5, Phos </= 1, or Mag </= 1 Physician:    Markus Daft A 05/11/2023 4:45 AM

## 2023-05-11 NOTE — Plan of Care (Signed)

## 2023-05-12 ENCOUNTER — Inpatient Hospital Stay (HOSPITAL_COMMUNITY)

## 2023-05-12 DIAGNOSIS — J189 Pneumonia, unspecified organism: Secondary | ICD-10-CM | POA: Diagnosis not present

## 2023-05-12 DIAGNOSIS — A419 Sepsis, unspecified organism: Secondary | ICD-10-CM | POA: Diagnosis not present

## 2023-05-12 LAB — LEGIONELLA PNEUMOPHILA SEROGP 1 UR AG: L. pneumophila Serogp 1 Ur Ag: NEGATIVE

## 2023-05-12 LAB — COMPREHENSIVE METABOLIC PANEL
ALT: 44 U/L (ref 0–44)
AST: 73 U/L — ABNORMAL HIGH (ref 15–41)
Albumin: 1.5 g/dL — ABNORMAL LOW (ref 3.5–5.0)
Alkaline Phosphatase: 150 U/L — ABNORMAL HIGH (ref 38–126)
Anion gap: 9 (ref 5–15)
BUN: 40 mg/dL — ABNORMAL HIGH (ref 8–23)
CO2: 23 mmol/L (ref 22–32)
Calcium: 8 mg/dL — ABNORMAL LOW (ref 8.9–10.3)
Chloride: 96 mmol/L — ABNORMAL LOW (ref 98–111)
Creatinine, Ser: 1.74 mg/dL — ABNORMAL HIGH (ref 0.61–1.24)
GFR, Estimated: 41 mL/min — ABNORMAL LOW (ref >60)
Glucose, Bld: 110 mg/dL — ABNORMAL HIGH (ref 70–99)
Potassium: 3.9 mmol/L (ref 3.5–5.1)
Sodium: 128 mmol/L — ABNORMAL LOW (ref 135–145)
Total Bilirubin: 0.5 mg/dL (ref 0.0–1.2)
Total Protein: 5.2 g/dL — ABNORMAL LOW (ref 6.5–8.1)

## 2023-05-12 LAB — MAGNESIUM: Magnesium: 2.6 mg/dL — ABNORMAL HIGH (ref 1.7–2.4)

## 2023-05-12 LAB — PROCALCITONIN: Procalcitonin: 1.95 ng/mL

## 2023-05-12 MED ORDER — SODIUM CHLORIDE (PF) 0.9 % IJ SOLN
10.0000 mg | Freq: Once | INTRAMUSCULAR | Status: AC
  Administered 2023-05-12: 10 mg via INTRAPLEURAL
  Filled 2023-05-12: qty 10

## 2023-05-12 MED ORDER — STERILE WATER FOR INJECTION IJ SOLN
5.0000 mg | Freq: Once | RESPIRATORY_TRACT | Status: AC
  Administered 2023-05-12: 5 mg via INTRAPLEURAL
  Filled 2023-05-12: qty 5

## 2023-05-12 MED ORDER — IPRATROPIUM-ALBUTEROL 0.5-2.5 (3) MG/3ML IN SOLN
3.0000 mL | RESPIRATORY_TRACT | Status: DC | PRN
  Administered 2023-05-14 – 2023-05-15 (×2): 3 mL via RESPIRATORY_TRACT
  Filled 2023-05-12 (×2): qty 3

## 2023-05-12 NOTE — Progress Notes (Signed)
 NAME:  Anthony Little, MRN:  [id number], DOB:  08-02-1950, LOS: 5 ADMISSION DATE:  05/07/2023, CONSULTATION DATE:  2/26 REFERRING MD:  Laural Benes, CHIEF COMPLAINT:  empyema, CAP and bacteremia   History of Present Illness:  73 year old male patient typically lives at home with his wife presented to [facility] on 2/24 with chief complaint of progressive shortness of breath, weakness, and cough.  He is typically ambulatory at home but does admit to being more sedentary over the winter. Noted he was starting to feel more short of breath around 2/20 with new cough, and intermittent subjective fever and worsening weakness.  In the emergency room he was found to be tachypneic, had a mild white blood cell count of 11.5, bicarbonate was 19 creatinine 1.54 respiratory viral panel was negative he had a large left loculated pleural effusion, He was admitted by the medical team started on IV ceftriaxone and azithromycin.  He was also found to be in atrial fibrillation with RVR and therefore was started on a Cardizem drip.  Cultures were sent, he was admitted to the intensive care subsequent evaluation is as follows: Blood cultures positive for strep pneumoniae, he underwent a left-sided thoracentesis yielding 1 L of fluid the Gram stain was positive for GPC, post chest x-ray still showing significant volume loss on the left side, he has now been referred to [facility] for sepsis, left-sided empyema, and pneumococcal bacteremia.  Infectious diseases already seen remotely he remains on antibiotics  Pertinent  Medical History  Accessible atrial fibrillation, typically on Eliquis and beta-blockade, hyperlipidemia, CKD stage IIIa, COPD, AAA, history of tobacco abuse, history of alcohol abuse. Raynauds   Significant Hospital Events: Including procedures, antibiotic start and stop dates in addition to other pertinent events   2/24 admitted to [facility] PNA, loculated effusion. Started on ABX. IVFs. Was in AF  w/ RVR.,started on CCB infusion.  2/25 increased WOB still on 3 lpm but had complete opacification of left hemithorax,. Lasix increased.  2/26 seen remotely in consult by ID for strep bacteremia. Felt bacteremia 2/2 CAP. Recommended drainage of pleural space, 5d azith and to cont ctx w/ f/u blood cultures for clearance. Underwent left thoracentesis. Drained 1 liter. Glucose 20, gm stain GPC. Transferred to [facility] for chest tube placement and additional support a left thoracostomy tube was placed on arrival 2/27 hemodynamics remained stable, chest tube output 1300 mL since insertion mild delirium during the at bedtime hours still requiring 10 L chest x-ray suggesting significant component of atelectasis receiving first dose of pleural lytic therapy, got a second dose of pleural lytics only put out about 90 cc.   2/28 down to 8 L.  Chest x-ray still with marked volume loss.  A total of 1850 output pleural fluid since chest tube insertion,. CT of chest shows the chest tube to be outside the pleural space. New chest tube placed  Interim History / Subjective:   Second chest tube placed first chest tube was out of pleural space.  No acute events overnight No airleak appreciated today  Objective   Blood pressure (!) 96/59, pulse 74, temperature (!) 97.3 F (36.3 C), temperature source Axillary, resp. rate 14, height 5\' 10"  (1.778 m), weight 86.3 kg, SpO2 98%.    FiO2 (%):  [36 %] 36 %   Intake/Output Summary (Last 24 hours) at 05/12/2023 1346 Last data filed at 05/12/2023 0719 Gross per 24 hour  Intake 1128.91 ml  Output 1285 ml  Net -156.09 ml   Ceasar Mons  Weights   05/08/23 0050 05/11/23 2309  Weight: 86.5 kg 86.3 kg    Examination: Blood pressure 90/70, pulse 74, temperature (!) 97.3 F (36.3 C), temperature source Axillary, resp. rate 14, height 5\' 10"  (1.778 m), weight 86.3 kg, SpO2 98%. Gen:      No acute distress, frail HEENT:  EOMI, sclera anicteric Neck:     No masses; no thyromegaly Lungs:     Rhonchi, diminished breath sounds on the left CV:         Regular rate and rhythm; no murmurs Abd:      + bowel sounds; soft, non-tender; no palpable masses, no distension Ext:    No edema; adequate peripheral perfusion Skin:      Warm and dry; no rash Neuro: Somnolent, arousable  Lab/imaging reviewed Significant for sodium 128, BUN/creatinine 40/1.74 AST 73, ALT 44 WBC 8.8, hemoglobin 9.4, platelets 105 Chest x-ray with hydropneumothorax on the left, and trapped lung  Resolved Hospital Problem list    Sepsis secondary to community-acquired pneumonia   Assessment & Plan:   acute hypoxic respiratory failure secondary to community-acquired pneumonia (pneumococcal) and empyema, as well as severe mucous plugging with left-sided atelectasis superimposed on Underlying COPD He is status post tube thoracostomy on 2/26 he has put out a total of 1840 mL, he had pleural lytics twice on 2/28 his initial response was fairly good with over 400 mL, put out only 90 cc after second dose.  Still with marked volume loss becoming more concerned that this is more complicated than simply mucous plugging Second chest tube placed Plan On supplemental oxygen Spirometry, flutter valve, nebulizers, hypertonic saline Brovana, budesonide, Yupelri nebs Continue chest tube to suction Output from the new chest tube has diminished to 30 cc since overnight.  He may have a component of an trapped lung due to presence of hydropneumothorax and incomplete expansion We will attempt lytics through the new chest tube to see if we can help expand the lung Continue antibiotics Follow pleural fluid cytology  pneumococcal  bacteremia -Seen by infectious disease, lactate was negative, all infectious parameters improving Plan Continue PCN-G-K, follow-up blood cultures to assess for bacterial clearance; Length of therapy still to be determined Follow-up cultures were sent on the 26th Additional recommended patients to be  determined additional recommendations to be determined by infectious disease  Best Practice (right click and "Reselect all SmartList Selections" daily)   Per primary team  Critical care time: NA   Chilton Greathouse MD  Pulmonary & Critical care See Amion for pager  If no response to pager , please call [phone] until 7pm After 7:00 pm call Elink  [phone] 05/12/2023, 1:46 PM

## 2023-05-12 NOTE — Progress Notes (Signed)
 OT Cancellation Note  Patient Details Name: VINCENTE ASBRIDGE MRN: [id number] DOB: May 23, 1950   Cancelled Treatment:    Reason Eval/Treat Not Completed: Other (comment) Pt just completed Pleural Fibrinolytic Administration Procedure. Plan to reattempt at a later date/time.  Raynald Kemp, OT Acute Rehabilitation Services Office: [phone]   Pilar Grammes 05/12/2023, 4:25 PM

## 2023-05-12 NOTE — TOC Progression Note (Signed)
 Transition of Care [facility]) - Progression Note    Patient Details  Name: Anthony Little MRN: [id number] Date of Birth: Jan 25, 1951  Transition of [facility]) CM/SW Contact  Carley Hammed, LCSW Phone Number: 05/12/2023, 11:04 AM  Clinical Narrative:    CSW spoke with pt's spouse and provided the available bed offer for [facility]. Spouse notes that is very far from their house. She states a preference for a facility with an [place] address. CSW informed her that [facility] has declined, but [facility] was still pending. CSW requested [facility] review pt for admission. TOC will continue to follow.    Expected Discharge Plan: Skilled Nursing Facility Barriers to Discharge: Continued Medical Work up  Expected Discharge Plan and Services In-house Referral: Clinical Social Work Discharge Planning Services: CM Consult Post Acute Care Choice: IP Rehab Living arrangements for the past 2 months: Single Family Home                                       Social Determinants of Health (SDOH) Interventions SDOH Screenings   Food Insecurity: No Food Insecurity (05/08/2023)  Housing: Low Risk  (05/08/2023)  Transportation Needs: No Transportation Needs (05/08/2023)  Utilities: Not At Risk (05/08/2023)  Depression (PHQ2-9): Medium Risk (01/09/2023)  Social Connections: Moderately Integrated (05/08/2023)  Tobacco Use: High Risk (05/07/2023)    Readmission Risk Interventions    05/09/2023   10:27 AM 05/08/2023    9:28 AM  Readmission Risk Prevention Plan  Transportation Screening Complete Complete  Home Care Screening Complete Complete  Medication Review (RN CM) Complete Complete

## 2023-05-12 NOTE — Plan of Care (Signed)

## 2023-05-12 NOTE — Plan of Care (Signed)

## 2023-05-12 NOTE — Progress Notes (Signed)
 Mobility Specialist Progress Note;   05/12/23 0940  Mobility  Activity Transferred from bed to chair  Level of Assistance Minimal assist, patient does 75% or more  Assistive Device Front wheel walker  Distance Ambulated (ft) 5 ft  Activity Response Tolerated well  Mobility Referral Yes  Mobility visit 1 Mobility  Mobility Specialist Start Time (ACUTE ONLY) 0940  Mobility Specialist Stop Time (ACUTE ONLY) 1000  Mobility Specialist Time Calculation (min) (ACUTE ONLY) 20 min   Pt agreeable to mobility. Required MinA to safely transfer pt from bed to chair. Verbal cues required throughout for RW proximity and safety. VSS on 4LO2. No c/o when asked. Pt left comfortably in chair with all needs met, alarm on. RN aware.   Caesar Bookman Mobility Specialist Please contact via SecureChat or [employer] [phone]

## 2023-05-12 NOTE — Progress Notes (Signed)
 PROGRESS NOTE    [name]  VFI:[id number] DOB: 11-09-1950 DOA: 05/07/2023 PCP: Babs Sciara, MD     Brief Narrative:  Anthony Little is a 73 year old male patient typically lives at home with his wife presented to [facility] on 2/24 with chief complaint of progressive shortness of breath, weakness, and cough. He is typically ambulatory at home but does admit to being more sedentary over the winter. Noted he was starting to feel more short of breath around 2/20 with new cough, and intermittent subjective fever and worsening weakness. In the emergency room he was found to be tachypneic, had a mild white blood cell count of 11.5, bicarbonate was 19 creatinine 1.54 respiratory viral panel was negative. He had a large left loculated pleural effusion, He was admitted by the medical team started on IV ceftriaxone and azithromycin. He was also found to be in atrial fibrillation with RVR and therefore was started on a Cardizem drip. Cultures were sent, he was admitted to the intensive care subsequent evaluation is as follows: Blood cultures positive for strep pneumoniae, he underwent a left-sided thoracentesis yielding 1 L of fluid the Gram stain was positive for GPC, post chest x-ray still showing significant volume loss on the left side, he has now been referred to [facility] for sepsis, left-sided empyema, and pneumococcal bacteremia. Infectious diseases already seen remotely he remains on antibiotics.   Significant Hospital Events:  2/24 admitted to [facility] PNA, loculated effusion. Started on ABX. IVFs. Was in AF w/ RVR.,started on CCB infusion.  2/25 increased WOB still on 3 lpm but had complete opacification of left hemithorax,. Lasix increased.  2/26 seen remotely in consult by ID for strep bacteremia. Felt bacteremia 2/2 CAP. Recommended drainage of pleural space, 5d azith and to cont ctx w/ f/u blood cultures for clearance. Underwent left thoracentesis. Drained 1 liter.  Glucose 20, gm stain GPC. [facility]red to [facility] for [facility] tube placement and additional support a left thoracostomy tube was placed on arrival 2/27 hemodynamics remained stable, chest tube output 1300 mL since insertion mild delirium during the at bedtime hours still requiring 10 L chest x-ray suggesting significant component of atelectasis receiving first dose of pleural lytic therapy, got a second dose of pleural lytics only put out about 90 cc.   2/28 down to 8 L.  Chest x-ray still with marked volume loss.  A total of 1850 output pleural fluid since chest tube insertion, getting CT of chest 3/1 transferred to [facility] team  New events last 24 hours / Subjective: Patient sitting in recliner, was able to get up with aid  Assessment & Plan:   Principal Problem:   Sepsis due [facility] Active Problems:   Mixed hyperlipidemia   COPD (chronic obstructive pulmonary disease) (HCC)   Pleural effusion on left   Paroxysmal atrial fibrillation (HCC)   Empyema (HCC)   Acute respiratory failure with hypoxia (HCC)   Pneumonia of left lung due to Streptococcus pneumoniae (HCC)   Acute hypoxic respiratory failure secondary to pneumococcal pneumonia, empyema -Remains on 4 L oxygen -Chest tube in place, PCCM following for management -Infectious disease following, remains on IV penicillin   A-fib RVR -Continue Cardizem -Holding anticoagulation as he has been getting pleural thrombolytic therapy  Delirium, sundowning -Delirium precaution -Seroquel  CKD stage IIIa -Baseline creatinine around 1.6 -Stable  Chronic hyponatremia -Baseline sodium levels range 131-135 -Continue to monitor closely, sodium level slightly lower at 128 this morning  Hyperlipidemia -Crestor  Depression -  Zoloft   DVT prophylaxis:  heparin injection 5,000 Units Start: 05/09/23 2200 Place and maintain sequential compression device Start: 05/08/23 1324  Code Status: DNR Family Communication: None at  bedside Disposition Plan: SNF Status is: Inpatient Remains inpatient appropriate because: IV antibiotics, chest tube in place, on oxygen    Antimicrobials:  Anti-infectives (From admission, onward)    Start     Dose/Rate Route Frequency Ordered Stop   05/11/23 1000  penicillin G potassium 9 Million Units in dextrose 5 % 500 mL CONTINUOUS infusion        9 Million Units 41.7 mL/hr over 12 Hours Intravenous Every 12 hours 05/10/23 1222     05/09/23 1630  vancomycin (VANCOREADY) IVPB 1500 mg/300 mL  Status:  Discontinued        1,500 mg 150 mL/hr over 120 Minutes Intravenous Every 24 hours 05/08/23 1544 05/09/23 0901   05/09/23 1000  cefTRIAXone (ROCEPHIN) 2 g in sodium chloride 0.9 % 100 mL IVPB  Status:  Discontinued        2 g 200 mL/hr over 30 Minutes Intravenous Every 24 hours 05/09/23 0901 05/10/23 1222   05/08/23 1630  vancomycin (VANCOREADY) IVPB 1750 mg/350 mL        1,750 mg 175 mL/hr over 120 Minutes Intravenous  Once 05/08/23 1544 05/08/23 1845   05/08/23 1000  cefTRIAXone (ROCEPHIN) 1 g in sodium chloride 0.9 % 100 mL IVPB  Status:  Discontinued        1 g 200 mL/hr over 30 Minutes Intravenous Every 24 hours 05/08/23 0646 05/09/23 0901   05/08/23 1000  azithromycin (ZITHROMAX) 500 mg in sodium chloride 0.9 % 250 mL IVPB  Status:  Discontinued        500 mg 250 mL/hr over 60 Minutes Intravenous Every 24 hours 05/08/23 0646 05/10/23 1222   05/07/23 2230  cefTRIAXone (ROCEPHIN) 2 g in sodium chloride 0.9 % 100 mL IVPB        2 g 200 mL/hr over 30 Minutes Intravenous Once 05/07/23 2220 05/07/23 2321   05/07/23 2230  azithromycin (ZITHROMAX) 500 mg in sodium chloride 0.9 % 250 mL IVPB        500 mg 250 mL/hr over 60 Minutes Intravenous  Once 05/07/23 2220 05/08/23 0005        Objective: Vitals:   05/12/23 1029 05/12/23 1200 05/12/23 1216 05/12/23 1321  BP: 94/62 (!) 94/56 (!) 96/59 90/70  Pulse:  73 74   Resp:  14    Temp: (!) 97.3 F (36.3 C)     TempSrc:  Axillary     SpO2:  98%    Weight:      Height:        Intake/Output Summary (Last 24 hours) at 05/12/2023 1356 Last data filed at 05/12/2023 0719 Gross per 24 hour  Intake 1128.91 ml  Output 1285 ml  Net -156.09 ml   Filed Weights   05/08/23 0050 05/11/23 2309  Weight: 86.5 kg 86.3 kg    Examination:  General exam: Appears calm, chronically ill-appearing Respiratory system: Without respiratory distress, chest tube is in place.  Cardiovascular system: Irregular rhythm Gastrointestinal system: Abdomen is nondistended, soft  Central nervous system: Alert and oriented.  Extremities: Symmetric in appearance  Psychiatry: Mood & affect appropriate.   Data Reviewed: I have personally reviewed following labs and imaging studies  CBC: Recent Labs  Lab 05/07/23 2012 05/08/23 0548 05/09/23 0819 05/09/23 2005 05/10/23 0239 05/11/23 0303  WBC 11.5* 6.2 4.9  --  6.4  8.6  HGB 13.1 11.0* 11.5* 11.9* 11.4* 9.4*  HCT 40.2 33.7* 34.2* 35.0* 33.3* 28.5*  MCV 109.8* 104.7* 103.3*  --  102.1* 104.0*  PLT 137* 111* 141*  --  133* 105*   Basic Metabolic Panel: Recent Labs  Lab 05/08/23 0548 05/09/23 0819 05/09/23 2005 05/09/23 2010 05/10/23 0239 05/11/23 0303 05/12/23 0435  NA 135 133* 132* 133* 133* 132* 128*  K 3.7 3.2* 3.1* 3.3* 4.1 4.1 3.9  CL 102 95*  --  95* 95* 98 96*  CO2 22 22  --  24 23 19* 23  GLUCOSE 85 111*  --  101* 99 79 110*  BUN 29* 34*  --  33* 37* 35* 40*  CREATININE 1.34* 1.59*  --  1.60* 1.73* 1.47* 1.74*  CALCIUM 8.1* 8.5*  --  8.3* 8.3* 7.9* 8.0*  MG 1.4* 1.8  --   --  1.7 1.5* 2.6*  PHOS 3.7  --   --   --   --   --   --    GFR: Estimated Creatinine Clearance: 40.2 mL/min (A) (by C-G formula based on SCr of 1.74 mg/dL (H)). Liver Function Tests: Recent Labs  Lab 05/08/23 0548 05/09/23 0819 05/10/23 0239 05/11/23 0303 05/12/23 0435  AST 25 41 177* 107* 73*  ALT 12 18 52* 50* 44  ALKPHOS 77 90 123 116 150*  BILITOT 1.5* 1.3* 1.2 0.6 0.5  PROT  6.2* 6.7 5.9* 4.6* 5.2*  ALBUMIN 2.3* 2.3* 1.8* <1.5* 1.5*   No results for input(s): "LIPASE", "AMYLASE" in the last 168 hours. No results for input(s): "AMMONIA" in the last 168 hours. Coagulation Profile: Recent Labs  Lab 05/07/23 2112  INR 1.2   Cardiac Enzymes: No results for input(s): "CKTOTAL", "CKMB", "CKMBINDEX", "TROPONINI" in the last 168 hours. BNP (last 3 results) No results for input(s): "PROBNP" in the last 8760 hours. HbA1C: No results for input(s): "HGBA1C" in the last 72 hours. CBG: No results for input(s): "GLUCAP" in the last 168 hours. Lipid Profile: No results for input(s): "CHOL", "HDL", "LDLCALC", "TRIG", "CHOLHDL", "LDLDIRECT" in the last 72 hours. Thyroid Function Tests: No results for input(s): "TSH", "T4TOTAL", "FREET4", "T3FREE", "THYROIDAB" in the last 72 hours. Anemia Panel: No results for input(s): "VITAMINB12", "FOLATE", "FERRITIN", "TIBC", "IRON", "RETICCTPCT" in the last 72 hours. Sepsis Labs: Recent Labs  Lab 05/07/23 2242 05/08/23 0548 05/09/23 2010 05/09/23 2139 05/10/23 0239 05/11/23 0303 05/12/23 0435  PROCALCITON  --    < > 2.69  --  2.77 1.86 1.95  LATICACIDVEN 1.5  --  1.4 1.4  --   --   --    < > = values in this interval not displayed.    Recent Results (from the past 240 hours)  Resp panel by RT-PCR (RSV, Flu A&B, Covid) Anterior Nasal Swab     Status: None   Collection Time: 05/07/23  7:41 PM   Specimen: Anterior Nasal Swab  Result Value Ref Range Status   SARS Coronavirus 2 by RT PCR NEGATIVE NEGATIVE Final    Comment: (NOTE) SARS-CoV-2 target nucleic acids are NOT DETECTED.  The SARS-CoV-2 RNA is generally detectable in upper respiratory specimens during the acute phase of infection. The lowest concentration of SARS-CoV-2 viral copies this assay can detect is 138 copies/mL. A negative result does not preclude SARS-Cov-2 infection and should not be used as the sole basis for treatment or other patient management  decisions. A negative result may occur with  improper specimen collection/handling, submission of specimen other  than nasopharyngeal swab, presence of viral mutation(s) within the areas targeted by this assay, and inadequate number of viral copies(<138 copies/mL). A negative result must be combined with clinical observations, patient history, and epidemiological information. The expected result is Negative.  Fact Sheet for Patients:  BloggerCourse.com  Fact Sheet for Healthcare Providers:  SeriousBroker.it  This test is no t yet approved or cleared by the [place] FDA and  has been authorized for detection and/or diagnosis of SARS-CoV-2 by FDA under an Emergency Use Authorization (EUA). This EUA will remain  in effect (meaning this test can be used) for the duration of the COVID-19 declaration under Section 564(b)(1) of the Act, 21 U.S.C.section 360bbb-3(b)(1), unless the authorization is terminated  or revoked sooner.       Influenza A by PCR NEGATIVE NEGATIVE Final   Influenza B by PCR NEGATIVE NEGATIVE Final    Comment: (NOTE) The Xpert Xpress SARS-CoV-2/FLU/RSV plus assay is intended as an aid in the diagnosis of influenza from Nasopharyngeal swab specimens and should not be used as a sole basis for treatment. Nasal washings and aspirates are unacceptable for Xpert Xpress SARS-CoV-2/FLU/RSV testing.  Fact Sheet for Patients: BloggerCourse.com  Fact Sheet for Healthcare Providers: SeriousBroker.it  This test is not yet approved or cleared by the [place] FDA and has been authorized for detection and/or diagnosis of SARS-CoV-2 by FDA under an Emergency Use Authorization (EUA). This EUA will remain in effect (meaning this test can be used) for the duration of the COVID-19 declaration under Section 564(b)(1) of the Act, 21 U.S.C. section 360bbb-3(b)(1), unless the  authorization is terminated or revoked.     Resp Syncytial Virus by PCR NEGATIVE NEGATIVE Final    Comment: (NOTE) Fact Sheet for Patients: BloggerCourse.com  Fact Sheet for Healthcare Providers: SeriousBroker.it  This test is not yet approved or cleared by the [place] FDA and has been authorized for detection and/or diagnosis of SARS-CoV-2 by FDA under an Emergency Use Authorization (EUA). This EUA will remain in effect (meaning this test can be used) for the duration of the COVID-19 declaration under Section 564(b)(1) of the Act, 21 U.S.C. section 360bbb-3(b)(1), unless the authorization is terminated or revoked.  Performed at [facility], [address], [place], [place] [address]   Blood Culture (routine x 2)     Status: Abnormal   Collection Time: 05/07/23 10:40 PM   Specimen: BLOOD LEFT ARM  Result Value Ref Range Status   Specimen Description   Final    BLOOD LEFT ARM Performed at [facility], [address] [place], [place] [address]    Special Requests   Final    BOTTLES DRAWN AEROBIC AND ANAEROBIC Blood Culture adequate volume Performed at [facility] [address], [place], [place] [address]    Culture  Setup Time   Final    GRAM POSITIVE COCCI IN BOTH AEROBIC AND ANAEROBIC BOTTLES Gram Stain Report Called to,Read Back By and Verified With: Melvern Banker, RN AT 1041 05/08/23 BY Kandice Moos Performed at [facility], [address], [place], [place] [address]    Culture (A)  Final    STREPTOCOCCUS PNEUMONIAE SUSCEPTIBILITIES PERFORMED ON PREVIOUS CULTURE WITHIN THE LAST 5 DAYS. Performed at [facility], [address], [place], [place] [address]    Report Status 05/10/2023 FINAL  Final  Blood Culture (routine x 2)     Status: Abnormal   Collection Time: 05/07/23 10:42 PM   Specimen: BLOOD LEFT ARM  Result Value  Ref Range Status   Specimen Description   Final    BLOOD LEFT ARM Performed  at [facility], [address], [place], [place] [address]    Special Requests   Final    BOTTLES DRAWN AEROBIC AND ANAEROBIC Blood Culture adequate volume Performed at [facility], [address] [place], [place] [address]    Culture  Setup Time   Final    GRAM POSITIVE COCCI IN BOTH AEROBIC AND ANAEROBIC BOTTLES Gram Stain Report Called to,Read Back By and Verified With: EDGAR TINAJERO,RN AT 1041 05/08/23 BY A. SNYDER CRITICAL RESULT CALLED TO, READ BACK BY AND VERIFIED WITH: RN Marigene Ehlers [phone] @1912  FH Performed at [facility], [address], [place], [place] [address]    Culture STREPTOCOCCUS PNEUMONIAE (A)  Final   Report Status 05/10/2023 FINAL  Final   Organism ID, Bacteria STREPTOCOCCUS PNEUMONIAE  Final      Susceptibility   Streptococcus pneumoniae - MIC*    ERYTHROMYCIN <=0.12 SENSITIVE Sensitive     LEVOFLOXACIN 1 SENSITIVE Sensitive     VANCOMYCIN 0.5 SENSITIVE Sensitive     PENICILLIN (meningitis) <=0.06 SENSITIVE Sensitive     PENO - penicillin <=0.06      PENICILLIN (non-meningitis) <=0.06 SENSITIVE Sensitive     PENICILLIN (oral) <=0.06 SENSITIVE Sensitive     CEFTRIAXONE (non-meningitis) <=0.12 SENSITIVE Sensitive     CEFTRIAXONE (meningitis) <=0.12 SENSITIVE Sensitive     * STREPTOCOCCUS PNEUMONIAE  Blood Culture ID Panel (Reflexed)     Status: Abnormal   Collection Time: 05/07/23 10:42 PM  Result Value Ref Range Status   Enterococcus faecalis NOT DETECTED NOT DETECTED Final   Enterococcus Faecium NOT DETECTED NOT DETECTED Final   Listeria monocytogenes NOT DETECTED NOT DETECTED Final   Staphylococcus species NOT DETECTED NOT DETECTED Final   Staphylococcus aureus (BCID) NOT DETECTED NOT DETECTED Final   Staphylococcus epidermidis NOT DETECTED NOT DETECTED Final   Staphylococcus lugdunensis NOT DETECTED NOT DETECTED Final   Streptococcus species DETECTED (A) NOT DETECTED Final    Comment: CRITICAL RESULT CALLED TO, READ BACK BY AND VERIFIED  WITH: RN A. RONE [id number] @1912  FH`    Streptococcus agalactiae NOT DETECTED NOT DETECTED Final   Streptococcus pneumoniae DETECTED (A) NOT DETECTED Final    Comment: CRITICAL RESULT CALLED TO, READ BACK BY AND VERIFIED WITH: RN A. RONE [id number] @1912  FH    Streptococcus pyogenes NOT DETECTED NOT DETECTED Final   A.calcoaceticus-baumannii NOT DETECTED NOT DETECTED Final   Bacteroides fragilis NOT DETECTED NOT DETECTED Final   Enterobacterales NOT DETECTED NOT DETECTED Final   Enterobacter cloacae complex NOT DETECTED NOT DETECTED Final   Escherichia coli NOT DETECTED NOT DETECTED Final   Klebsiella aerogenes NOT DETECTED NOT DETECTED Final   Klebsiella oxytoca NOT DETECTED NOT DETECTED Final   Klebsiella pneumoniae NOT DETECTED NOT DETECTED Final   Proteus species NOT DETECTED NOT DETECTED Final   Salmonella species NOT DETECTED NOT DETECTED Final   Serratia marcescens NOT DETECTED NOT DETECTED Final   Haemophilus influenzae NOT DETECTED NOT DETECTED Final   Neisseria meningitidis NOT DETECTED NOT DETECTED Final   Pseudomonas aeruginosa NOT DETECTED NOT DETECTED Final   Stenotrophomonas maltophilia NOT DETECTED NOT DETECTED Final   Candida albicans NOT DETECTED NOT DETECTED Final   Candida auris NOT DETECTED NOT DETECTED Final   Candida glabrata NOT DETECTED NOT DETECTED Final   Candida krusei NOT DETECTED NOT DETECTED Final   Candida parapsilosis NOT DETECTED NOT DETECTED Final  Candida tropicalis NOT DETECTED NOT DETECTED Final   Cryptococcus neoformans/gattii NOT DETECTED NOT DETECTED Final    Comment: Performed at [facility], [address] [place], [place] [address]  MRSA Next Gen by PCR, Nasal     Status: None   Collection Time: 05/08/23 12:40 AM   Specimen: Nasal Mucosa; Nasal Swab  Result Value Ref Range Status   MRSA by PCR Next Gen NOT DETECTED NOT DETECTED Final    Comment: (NOTE) The GeneXpert MRSA Assay (FDA approved for NASAL specimens only), is one  component of a comprehensive MRSA colonization surveillance program. It is not intended to diagnose MRSA infection nor to guide or monitor treatment for MRSA infections. Test performance is not FDA approved in patients less than 38 years old. Performed at [facility], [address], [place], [place] [address]   Culture, blood (Routine X 2) w Reflex to ID Panel     Status: None (Preliminary result)   Collection Time: 05/09/23  4:14 AM   Specimen: BLOOD  Result Value Ref Range Status   Specimen Description BLOOD BLOOD LEFT ARM  Final   Special Requests   Final    BOTTLES DRAWN AEROBIC AND ANAEROBIC Blood Culture adequate volume   Culture   Final    NO GROWTH 3 DAYS Performed at [facility], [address], [place], [place] [address]    Report Status PENDING  Incomplete  Culture, blood (Routine X 2) w Reflex to ID Panel     Status: None (Preliminary result)   Collection Time: 05/09/23  4:16 AM   Specimen: BLOOD  Result Value Ref Range Status   Specimen Description BLOOD BLOOD LEFT HAND  Final   Special Requests   Final    BOTTLES DRAWN AEROBIC AND ANAEROBIC Blood Culture adequate volume   Culture   Final    NO GROWTH 3 DAYS Performed at [facility], [address] [place], [place] [address]    Report Status PENDING  Incomplete  Gram stain     Status: None   Collection Time: 05/09/23 12:55 PM   Specimen: Pleura  Result Value Ref Range Status   Specimen Description PLEURAL  Final   Special Requests NONE  Final   Gram Stain   Final    PLEURAL GRAM POSITIVE COCCI WBC PRESENT, PREDOMINANTLY PMN CYTOSPIN SMEAR Gram Stain Report Called to,Read Back By and Verified With: Quin Hoop, RN AT 1514 05/09/23 BY Kandice Moos Performed at [facility], [address] [place], [place] [address]    Report Status 05/09/2023 FINAL  Final  Culture, body fluid w Gram Stain-bottle     Status: None (Preliminary result)   Collection Time: 05/09/23 12:55 PM   Specimen: Pleura  Result Value Ref  Range Status   Specimen Description PLEURAL  Final   Special Requests BOTTLES DRAWN AEROBIC AND ANAEROBIC 10CC  Final   Culture   Final    NO GROWTH 3 DAYS Performed at [facility], [address] [place], [place] [address]    Report Status PENDING  Incomplete      Radiology Studies: DG Chest Port 1 View Result Date: 05/12/2023 CLINICAL DATA:  Pleural effusion. EXAM: PORTABLE CHEST 1 VIEW COMPARISON:  Radiographs 05/11/2023 and 05/10/2023.  CT 05/11/2023. FINDINGS: 0738 hours. Left pleural pigtail catheter is unchanged from the most recent study of yesterday afternoon. The pigtail is incompletely formed, and the catheter appears kinked peripheral to the chest wall. The catheter is peripherally positioned and its position within  the pleural space cannot be confirmed based on this single view. Incomplete expansion of the left lung with unchanged left pulmonary opacity and left basilar hydropneumothorax. The right lung is clear. The heart size and mediastinal contours are stable. IMPRESSION: 1. Unchanged appearance of the left pleural pigtail catheter after thoracostomy tube placement/repositioning yesterday. The catheter is peripherally positioned and its position within the pleural space cannot be confirmed based on this single view. It was in the chest wall soft tissues on the CT performed yesterday morning. 2. Unchanged left pulmonary opacity and left basilar hydropneumothorax. Electronically Signed   By: Carey Bullocks M.D.   On: 05/12/2023 11:38   DG Chest Port 1 View Result Date: 05/11/2023 CLINICAL DATA:  Thoracostomy tube placement EXAM: PORTABLE CHEST 1 VIEW COMPARISON:  05/11/2023 FINDINGS: The left chest tube now projects inside the ribs and has likely been adjusted in the interval. However, patient rotation could account for alteration appearance of the catheter. Tube appears to be kinked. Pleural effusion appears slightly smaller with improved aeration in the left lung although still  residual effusion and consolidation present. Loculated pleural gas collection around the tube is mildly increased in the interval. Right lung is clear. Cardiac enlargement. Calcification of the aorta. Degenerative changes in the spine. IMPRESSION: Left chest tube now projects within the ribs, possibly readjusted or artifact due to patient rotation. The tube appears kinked. Small loculated pneumothorax around the tube is mildly increased. Left pleural effusion is decreased and aeration of the left lung is mildly improved. Electronically Signed   By: Burman Nieves M.D.   On: 05/11/2023 16:12   CT CHEST WO CONTRAST Result Date: 05/11/2023 CLINICAL DATA:  Pleural effusion. EXAM: CT CHEST WITHOUT CONTRAST TECHNIQUE: Multidetector CT imaging of the chest was performed following the standard protocol without IV contrast. RADIATION DOSE REDUCTION: This exam was performed according to the departmental dose-optimization program which includes automated exposure control, adjustment of the mA and/or kV according to patient size and/or use of iterative reconstruction technique. COMPARISON:  CT scan of May 09, 2023. FINDINGS: Cardiovascular: Atherosclerosis of thoracic aorta without aneurysm formation. Normal cardiac size. No pericardial effusion. Coronary artery calcifications are noted. Mediastinum/Nodes: No enlarged mediastinal or axillary lymph nodes. Thyroid gland, trachea, and esophagus demonstrate no significant findings. Lungs/Pleura: Emphysematous disease is noted. Minimal right pleural effusion is noted with adjacent subsegmental atelectasis. Left-sided chest tube is external to thoracic space. Mild left hydropneumothorax is noted. Left basilar atelectasis or infiltrate is noted. Volume loss is noted on the left with some degree of mediastinal shift to the left. Upper Abdomen: No acute abnormality. Musculoskeletal: No chest wall mass or suspicious bone lesions identified. IMPRESSION: Left-sided chest tube is  completely external to thoracic space. Mild left hydropneumothorax is noted with minimal pneumothorax component seen in apical region. Left basilar atelectasis or infiltrate is again noted, with associated volume loss on the left with associated mediastinal shift to the left. These results will be called to the ordering clinician or representative by the Radiologist Assistant, and communication documented in the PACS or zVision Dashboard. Coronary artery calcifications are noted suggesting coronary artery disease. Minimal right pleural effusion is noted with minimal adjacent subsegmental atelectasis. Aortic Atherosclerosis (ICD10-I70.0) and Emphysema (ICD10-J43.9). Electronically Signed   By: Lupita Raider M.D.   On: 05/11/2023 12:41   DG Chest Port 1 View Result Date: 05/11/2023 CLINICAL DATA:  Follow up atelectasis in patient with sepsis due to pneumonia. EXAM: PORTABLE CHEST 1 VIEW COMPARISON:  Radiograph 05/10/2023 and 05/09/2023.  CT 05/09/2023. FINDINGS: 0529 hours. Small caliber left chest tube is in place with the central coiled component projecting lateral to the left chest wall, external to the pleural space. Persistent volume loss in the left hemithorax with some interval improved aeration of the left lung. Underlying loculated left pleural effusion remains. The right lung is clear. The visualized heart size and mediastinal contours are stable with aortic atherosclerosis. IMPRESSION: 1. Persistent volume loss in the left hemithorax with some interval improved aeration of the left lung. Underlying loculated left pleural effusion remains. 2. The tip of the left chest tube is external to the pleural space. Recommend repositioning/replacement. 3. These results will be called to the ordering clinician or representative by the Radiologist Assistant, and communication documented in the PACS or [employer]. Electronically Signed   By: Carey Bullocks M.D.   On: 05/11/2023 10:31   DG Chest Port 1  View Result Date: 05/10/2023 CLINICAL DATA:  Atelectasis. EXAM: PORTABLE CHEST 1 VIEW COMPARISON:  05/10/2023 FINDINGS: Left chest tube is in place. Persistent diffuse opacification of the left chest with mediastinal shift to the left. In the absence of pneumonectomy, this is consistent with pleural effusion, atelectasis, and/or consolidation in the lung. No improvement of aeration since previous study. Focal lucency in the lateral chest wall adjacent to the chest tube may represent a small loculated pneumothorax or small area of aeration. Right lung is clear. Calcification of the aorta. IMPRESSION: Persistent volume loss and diffuse opacification of the left chest despite chest tube placement. No improvement since previous study. Electronically Signed   By: Burman Nieves M.D.   On: 05/10/2023 18:18      Scheduled Meds:  Chlorhexidine Gluconate Cloth  6 each Topical Daily   dextromethorphan-guaiFENesin  1 tablet Oral BID   diltiazem  30 mg Oral Q6H   folic acid  1 mg Oral Daily   heparin injection (subcutaneous)  5,000 Units Subcutaneous Q8H   mouth rinse  15 mL Mouth Rinse 4 times per day   mouth rinse  15 mL Mouth Rinse 4 times per day   polyethylene glycol  17 g Oral Daily   QUEtiapine  25 mg Oral QHS   rosuvastatin  10 mg Oral Daily   senna-docusate  1 tablet Oral Daily   sertraline  50 mg Oral Daily   Continuous Infusions:  penicillin G potassium 9 Million Units in dextrose 5 % 500 mL CONTINUOUS infusion 9 Million Units (05/12/23 0813)     LOS: 5 days   Time spent: 35 minutes   Noralee Stain, DO [facility] 05/12/2023, 1:56 PM   Available via Epic secure chat 7am-7pm After these hours, please refer to coverage provider listed on [email or web address]

## 2023-05-12 NOTE — Procedures (Signed)
 Pleural Fibrinolytic Administration Procedure Note  Anthony Little  [id number]  [birth year]  Date:05/12/23  Time:3:33 PM   Provider Performing:Lear Carstens D. Harris   Procedure: Pleural Fibrinolysis Subsequent day (11914)  Indication(s) Fibrinolysis of complicated pleural effusion  Consent Risks of the procedure as well as the alternatives and risks of each were explained to the patient and/or caregiver.  Consent for the procedure was obtained.   Anesthesia None   Time Out Verified patient identification, verified procedure, site/side was marked, verified correct patient position, special equipment/implants available, medications/allergies/relevant history reviewed, required imaging and test results available.   Sterile Technique Hand hygiene, gloves   Procedure Description Existing pleural catheter was cleaned and accessed in sterile manner.  10mg  of tPA in 30cc of saline and 5mg  of dornase in 30cc of sterile water were injected into pleural space using existing pleural catheter.  Catheter will be clamped for 1 hour and then placed back to suction.   Complications/Tolerance None; patient tolerated the procedure well.  EBL None   Specimen(s) None  Guida Asman D. Harris, NP-C [facility] Personal contact information can be found on Amion  If no contact or response made please call [phone] 05/12/2023, 3:34 PM

## 2023-05-12 DEATH — deceased

## 2023-05-13 ENCOUNTER — Other Ambulatory Visit: Payer: Self-pay | Admitting: Family Medicine

## 2023-05-13 ENCOUNTER — Inpatient Hospital Stay (HOSPITAL_COMMUNITY)

## 2023-05-13 DIAGNOSIS — R6520 Severe sepsis without septic shock: Secondary | ICD-10-CM

## 2023-05-13 DIAGNOSIS — A419 Sepsis, unspecified organism: Secondary | ICD-10-CM | POA: Diagnosis not present

## 2023-05-13 DIAGNOSIS — J189 Pneumonia, unspecified organism: Secondary | ICD-10-CM | POA: Diagnosis not present

## 2023-05-13 DIAGNOSIS — I4891 Unspecified atrial fibrillation: Secondary | ICD-10-CM | POA: Diagnosis not present

## 2023-05-13 DIAGNOSIS — J90 Pleural effusion, not elsewhere classified: Secondary | ICD-10-CM | POA: Diagnosis not present

## 2023-05-13 LAB — COMPREHENSIVE METABOLIC PANEL
ALT: 39 U/L (ref 0–44)
AST: 57 U/L — ABNORMAL HIGH (ref 15–41)
Albumin: <1.5 g/dL — ABNORMAL LOW (ref 3.5–5.0)
Alkaline Phosphatase: 151 U/L — ABNORMAL HIGH (ref 38–126)
Anion gap: 17 — ABNORMAL HIGH (ref 5–15)
BUN: 40 mg/dL — ABNORMAL HIGH (ref 8–23)
CO2: 23 mmol/L (ref 22–32)
Calcium: 8.2 mg/dL — ABNORMAL LOW (ref 8.9–10.3)
Chloride: 88 mmol/L — ABNORMAL LOW (ref 98–111)
Creatinine, Ser: 2.13 mg/dL — ABNORMAL HIGH (ref 0.61–1.24)
GFR, Estimated: 32 mL/min — ABNORMAL LOW (ref >60)
Glucose, Bld: 108 mg/dL — ABNORMAL HIGH (ref 70–99)
Potassium: 4.3 mmol/L (ref 3.5–5.1)
Sodium: 128 mmol/L — ABNORMAL LOW (ref 135–145)
Total Bilirubin: 0.5 mg/dL (ref 0.0–1.2)
Total Protein: 5.3 g/dL — ABNORMAL LOW (ref 6.5–8.1)

## 2023-05-13 LAB — CBC
HCT: 30.3 % — ABNORMAL LOW (ref 39.0–52.0)
Hemoglobin: 10.2 g/dL — ABNORMAL LOW (ref 13.0–17.0)
MCH: 35.2 pg — ABNORMAL HIGH (ref 26.0–34.0)
MCHC: 33.7 g/dL (ref 30.0–36.0)
MCV: 104.5 fL — ABNORMAL HIGH (ref 80.0–100.0)
Platelets: 127 10*3/uL — ABNORMAL LOW (ref 150–400)
RBC: 2.9 MIL/uL — ABNORMAL LOW (ref 4.22–5.81)
RDW: 13.3 % (ref 11.5–15.5)
WBC: 9.1 10*3/uL (ref 4.0–10.5)
nRBC: 0 % (ref 0.0–0.2)

## 2023-05-13 LAB — GLUCOSE, CAPILLARY: Glucose-Capillary: 163 mg/dL — ABNORMAL HIGH (ref 70–99)

## 2023-05-13 MED ORDER — BOOST / RESOURCE BREEZE PO LIQD CUSTOM
1.0000 | Freq: Two times a day (BID) | ORAL | Status: DC
  Administered 2023-05-14: 1 via ORAL

## 2023-05-13 MED ORDER — BISACODYL 10 MG RE SUPP
10.0000 mg | Freq: Once | RECTAL | Status: AC
  Administered 2023-05-13: 10 mg via RECTAL
  Filled 2023-05-13: qty 1

## 2023-05-13 MED ORDER — LACTULOSE 10 GM/15ML PO SOLN
20.0000 g | Freq: Once | ORAL | Status: AC
Start: 2023-05-13 — End: 2023-05-13
  Administered 2023-05-13: 20 g via ORAL
  Filled 2023-05-13: qty 30

## 2023-05-13 MED ORDER — SODIUM CHLORIDE 0.9 % IV SOLN
INTRAVENOUS | Status: AC
Start: 2023-05-13 — End: 2023-05-14

## 2023-05-13 NOTE — Progress Notes (Signed)
   05/13/23 1600  What Happened  Was fall witnessed? Yes  Who witnessed fall? Flor Houdeshell RN, Shanda Bumps RN, Rayan NT, Danne Harbor RN  Patients activity before fall [employer] of contact other (comment) (Pt was assisted to his knees and then laid to the floor)  Was patient injured? No  Provider Notification  Provider Name/Title Dr. Isidoro Donning  Date Provider Notified 05/13/23  Time Provider Notified 1507  Method of Notification Page  Notification Reason Fall  Provider response No new orders  Date of Provider Response 05/13/23  Time of Provider Response 1548  Follow Up  Family notified Yes - comment  Time family notified 1500 (spouse [name] was in the room during the occurence)  Adult Fall Risk Assessment  Risk Factor Category (scoring not indicated) High fall risk per protocol (document High fall risk)  Age 73  Fall History: Fall within 6 months prior to admission 5  Elimination; Bowel and/or Urine Incontinence 2  Elimination; Bowel and/or Urine Urgency/Frequency 0  Medications: includes PCA/Opiates, Anti-convulsants, Anti-hypertensives, Diuretics, Hypnotics, Laxatives, Sedatives, and Psychotropics 3  Patient Care Equipment 3  Mobility-Assistance 2  Mobility-Gait 2  Mobility-Sensory Deficit 0  Altered awareness of immediate physical environment 0  Impulsiveness 0  Lack of understanding of one's physical/cognitive limitations 0  Total Score 19  Patient Fall Risk Level High fall risk  Adult Fall Risk Interventions  Required Bundle Interventions *See Row Information* High fall risk - low, moderate, and high requirements implemented  Additional Interventions Use of appropriate toileting equipment (bedpan, BSC, etc.)  Screening for Fall Injury Risk (To be completed on HIGH fall risk patients) - Assessing Need for Floor Mats  Risk For Fall Injury- Criteria for Floor Mats Previous fall this admission  Will Implement Floor Mats Yes  Vitals  Pulse Rate 91  ECG Heart Rate 89  Oxygen  Therapy  SpO2 97 %  Pain Assessment  Pain Scale 0-10  Pain Score 0  PCA/Epidural/Spinal Assessment  Respiratory Pattern Regular  Neurological  Neuro (WDL) X  Level of Consciousness Alert  Orientation Level Oriented to person;Oriented to time;Oriented to situation  Cognition Memory impairment  Speech Clear  Glasgow Coma Scale  Eye Opening 4  Best Verbal Response (NON-intubated) 4  Best Motor Response 6  Glasgow Coma Scale Score 14  Musculoskeletal  Musculoskeletal (WDL) X  Generalized Weakness Yes  Integumentary  Integumentary (WDL) X  Skin Color Mottled (feet, knees, hands, nose)  Skin Integrity Abrasion;Ecchymosis;Erythema/redness;Other (Comment) (skin tear left arm and right hand)

## 2023-05-13 NOTE — Progress Notes (Signed)
 [facility]                                                                              Anthony Little, is a 73 y.o. male, DOB - Dec 07, 1950, ZOX:[id number] Admit date - 05/07/2023    Outpatient Primary MD for the patient is Luking, Jonna Coup, MD  LOS - 6  days  Chief Complaint  Patient presents with   Shortness of Breath       Brief summary   Anthony Little is a 73 year old male patient typically lives at home with his wife presented to [facility] on 2/24 with chief complaint of progressive shortness of breath, weakness, and cough. He is typically ambulatory at home but does admit to being more sedentary over the winter. Noted he was starting to feel more short of breath around 2/20 with new cough, and intermittent subjective fever and worsening weakness. In the emergency room he was found to be tachypneic, had a mild white blood cell count of 11.5, bicarbonate was 19 creatinine 1.54 respiratory viral panel was negative. He had a large left loculated pleural effusion, He was admitted by the medical team started on IV ceftriaxone and azithromycin. He was also found to be in atrial fibrillation with RVR and therefore was started on a Cardizem drip. Cultures were sent, he was admitted to the intensive care subsequent evaluation is as follows: Blood cultures positive for strep pneumoniae, he underwent a left-sided thoracentesis yielding 1 L of fluid the Gram stain was positive for GPC, post chest x-ray still showing significant volume loss on the left side, he has now been referred to [facility] for sepsis, left-sided empyema, and pneumococcal bacteremia. Infectious diseases already seen remotely he remains on antibiotics.    Significant Hospital Events:  2/24 admitted to [facility] P[facility], loculated effusion. Started on ABX. IVFs. Was in AF w/ RVR.,started on CCB infusion.  2/25 increased WOB still on 3 lpm but had complete opacification of left hemithorax,. Lasix  increased.  2/26 seen remotely in consult by ID for strep bacteremia. Felt bacteremia 2/2 CAP. Recommended drainage of pleural space, 5d azith and to cont ctx w/ f/u blood cultures for clearance. Underwent left thoracentesis. Drained 1 liter. Glucose 20, gm stain GPC. Transferred to [facility] for chest tube placement and additional support a left thoracostomy tube was placed on arrival 2/27 hemodynamics remained stable, chest tube output 1300 mL since insertion mild delirium during the at bedtime hours still requiring 10 L chest x-ray suggesting significant component of atelectasis receiving first dose of pleural lytic therapy, got a second dose of pleural lytics only put out about 90 cc.   2/28 down to 8 L.  Chest x-ray still with marked volume loss.  A total of 1850 output pleural fluid since chest tube insertion, getting CT of chest 3/1 transferred to [facility] team   Assessment & Plan    Principal Problem:  Sepsis secondary to pneumonia, POA Acute hypoxic respiratory failure secondary to pneumococcal pneumonia, empyema Severe mucous plugging with left-sided atelectasis on underlying COPD -On 4 L O2 via [place], wean as tolerated  -Chest tube in place, PCCM following  for management -Infectious disease following, remains on IV penicillin  -Continue nebs, hypertonic saline, Brovana, budesonide, Yupelri nebs  Pneumococcal bacteremia -Seen by infectious disease, continue penicillin G -2D echo 2/25 Limited study with no definitive vegetations -Repeat blood cultures 2/26 so far negative   A-fib RVR -Continue Cardizem -Holding anticoagulation as he has been getting pleural thrombolytic therapy   Delirium, sundowning -Delirium precaution -Continue Seroquel   Acute on CKD stage IIIa -Baseline creatinine around 1.6 -Creatinine worsened to 2.13 today, - BP soft, will place on IV fluids   Chronic hyponatremia -Baseline sodium levels range 131-135 -Sodium 128, started normal saline fluids    Hyperlipidemia -Crestor   Depression -Zoloft  Estimated body mass index is 27.3 kg/m as calculated from the following:   Height as of this encounter: 5\' 10"  (1.778 m).   Weight as of this encounter: 86.3 kg.  Code Status: DNR DVT Prophylaxis:  heparin injection 5,000 Units Start: 05/09/23 2200 Place and maintain sequential compression device Start: 05/08/23 1324   Level of Care: Level of care: Progressive Family Communication: Updated patient's wife at the bedside Disposition Plan:      Remains inpatient appropriate:      Procedures:  Chest tube placement  Consultants:   ID PCCM  Antimicrobials:   Anti-infectives (From admission, onward)    Start     Dose/Rate Route Frequency Ordered Stop   05/11/23 1000  penicillin G potassium 9 Million Units in dextrose 5 % 500 mL CONTINUOUS infusion        9 Million Units 41.7 mL/hr over 12 Hours Intravenous Every 12 hours 05/10/23 1222     05/09/23 1630  vancomycin (VANCOREADY) IVPB 1500 mg/300 mL  Status:  Discontinued        1,500 mg 150 mL/hr over 120 Minutes Intravenous Every 24 hours 05/08/23 1544 05/09/23 0901   05/09/23 1000  cefTRIAXone (ROCEPHIN) 2 g in sodium chloride 0.9 % 100 mL IVPB  Status:  Discontinued        2 g 200 mL/hr over 30 Minutes Intravenous Every 24 hours 05/09/23 0901 05/10/23 1222   05/08/23 1630  vancomycin (VANCOREADY) IVPB 1750 mg/350 mL        1,750 mg 175 mL/hr over 120 Minutes Intravenous  Once 05/08/23 1544 05/08/23 1845   05/08/23 1000  cefTRIAXone (ROCEPHIN) 1 g in sodium chloride 0.9 % 100 mL IVPB  Status:  Discontinued        1 g 200 mL/hr over 30 Minutes Intravenous Every 24 hours 05/08/23 0646 05/09/23 0901   05/08/23 1000  azithromycin (ZITHROMAX) 500 mg in sodium chloride 0.9 % 250 mL IVPB  Status:  Discontinued        500 mg 250 mL/hr over 60 Minutes Intravenous Every 24 hours 05/08/23 0646 05/10/23 1222   05/07/23 2230  cefTRIAXone (ROCEPHIN) 2 g in sodium chloride 0.9 % 100 mL IVPB         2 g 200 mL/hr over 30 Minutes Intravenous Once 05/07/23 2220 05/07/23 2321   05/07/23 2230  azithromycin (ZITHROMAX) 500 mg in sodium chloride 0.9 % 250 mL IVPB        500 mg 250 mL/hr over 60 Minutes Intravenous  Once 05/07/23 2220 05/08/23 0005          Medications  Chlorhexidine Gluconate Cloth  6 each Topical Daily   dextromethorphan-guaiFENesin  1 tablet Oral BID   diltiazem  30 mg Oral Q6H   folic acid  1 mg Oral Daily   heparin injection (  subcutaneous)  5,000 Units Subcutaneous Q8H   mouth rinse  15 mL Mouth Rinse 4 times per day   mouth rinse  15 mL Mouth Rinse 4 times per day   polyethylene glycol  17 g Oral Daily   QUEtiapine  25 mg Oral QHS   rosuvastatin  10 mg Oral Daily   senna-docusate  1 tablet Oral Daily   sertraline  50 mg Oral Daily      Subjective:   Anthony Little was seen and examined today.  BP borderline soft.  No acute complaints, no chest pain, fevers or chills, nausea vomiting.  Shortness of breath is improving.   Objective:   Vitals:   05/12/23 2008 05/13/23 0028 05/13/23 0422 05/13/23 0536  BP: 106/75 103/67 (!) 104/57 91/62  Pulse: 83  86   Resp: 15  16   Temp: (!) 96.4 F (35.8 C)  (!) 97.3 F (36.3 C)   TempSrc: Oral  Oral   SpO2: 96%  90%   Weight:      Height:        Intake/Output Summary (Last 24 hours) at 05/13/2023 1042 Last data filed at 05/13/2023 0930 Gross per 24 hour  Intake 617.39 ml  Output 980 ml  Net -362.61 ml     Wt Readings from Last 3 Encounters:  05/11/23 86.3 kg  01/09/23 88.1 kg  12/28/22 87.5 kg     Exam General: Alert and oriented, NAD Cardiovascular: S1 S2 auscultated,  RRR Respiratory: Rhonchi, diminished breath sounds on the left Gastrointestinal: Soft, nontender, nondistended, + bowel sounds Ext: no pedal edema bilaterally Neuro: no new deficits Skin: No rashes Psych: Normal affect     Data Reviewed:  I have personally reviewed following labs    CBC Lab Results  Component  Value Date   WBC 9.1 05/13/2023   RBC 2.90 (L) 05/13/2023   HGB 10.2 (L) 05/13/2023   HCT 30.3 (L) 05/13/2023   MCV 104.5 (H) 05/13/2023   MCH 35.2 (H) 05/13/2023   PLT 127 (L) 05/13/2023   MCHC 33.7 05/13/2023   RDW 13.3 05/13/2023   LYMPHSABS 1.3 09/27/2022   MONOABS 0.4 02/13/2022   EOSABS 0.3 09/27/2022   BASOSABS 0.1 09/27/2022     Last metabolic panel Lab Results  Component Value Date   NA 128 (L) 05/13/2023   K 4.3 05/13/2023   CL 88 (L) 05/13/2023   CO2 23 05/13/2023   BUN 40 (H) 05/13/2023   CREATININE 2.13 (H) 05/13/2023   GLUCOSE 108 (H) 05/13/2023   GFRNONAA 32 (L) 05/13/2023   GFRAA 38 (L) 03/15/2020   CALCIUM 8.2 (L) 05/13/2023   PHOS 3.7 05/08/2023   PROT 5.3 (L) 05/13/2023   ALBUMIN <1.5 (L) 05/13/2023   LABGLOB 3.0 07/17/2022   AGRATIO 1.4 07/17/2022   BILITOT 0.5 05/13/2023   ALKPHOS 151 (H) 05/13/2023   AST 57 (H) 05/13/2023   ALT 39 05/13/2023   ANIONGAP 17 (H) 05/13/2023    CBG (last 3)  No results for input(s): "GLUCAP" in the last 72 hours.    Coagulation Profile: Recent Labs  Lab 05/07/23 2112  INR 1.2     Radiology Studies: I have personally reviewed the imaging studies  DG Chest Port 1 View Result Date: 05/12/2023 CLINICAL DATA:  Pleural effusion. EXAM: PORTABLE CHEST 1 VIEW COMPARISON:  Radiographs 05/11/2023 and 05/10/2023.  CT 05/11/2023. FINDINGS: 0738 hours. Left pleural pigtail catheter is unchanged from the most recent study of yesterday afternoon. The pigtail is incompletely formed, and  the catheter appears kinked peripheral to the chest wall. The catheter is peripherally positioned and its position within the pleural space cannot be confirmed based on this single view. Incomplete expansion of the left lung with unchanged left pulmonary opacity and left basilar hydropneumothorax. The right lung is clear. The heart size and mediastinal contours are stable. IMPRESSION: 1. Unchanged appearance of the left pleural pigtail catheter  after thoracostomy tube placement/repositioning yesterday. The catheter is peripherally positioned and its position within the pleural space cannot be confirmed based on this single view. It was in the chest wall soft tissues on the CT performed yesterday morning. 2. Unchanged left pulmonary opacity and left basilar hydropneumothorax. Electronically Signed   By: Carey Bullocks M.D.   On: 05/12/2023 11:38   DG Chest Port 1 View Result Date: 05/11/2023 CLINICAL DATA:  Thoracostomy tube placement EXAM: PORTABLE CHEST 1 VIEW COMPARISON:  05/11/2023 FINDINGS: The left chest tube now projects inside the ribs and has likely been adjusted in the interval. However, patient rotation could account for alteration appearance of the catheter. Tube appears to be kinked. Pleural effusion appears slightly smaller with improved aeration in the left lung although still residual effusion and consolidation present. Loculated pleural gas collection around the tube is mildly increased in the interval. Right lung is clear. Cardiac enlargement. Calcification of the aorta. Degenerative changes in the spine. IMPRESSION: Left chest tube now projects within the ribs, possibly readjusted or artifact due to patient rotation. The tube appears kinked. Small loculated pneumothorax around the tube is mildly increased. Left pleural effusion is decreased and aeration of the left lung is mildly improved. Electronically Signed   By: Burman Nieves M.D.   On: 05/11/2023 16:12   CT CHEST WO CONTRAST Result Date: 05/11/2023 CLINICAL DATA:  Pleural effusion. EXAM: CT CHEST WITHOUT CONTRAST TECHNIQUE: Multidetector CT imaging of the chest was performed following the standard protocol without IV contrast. RADIATION DOSE REDUCTION: This exam was performed according to the departmental dose-optimization program which includes automated exposure control, adjustment of the mA and/or kV according to patient size and/or use of iterative reconstruction  technique. COMPARISON:  CT scan of May 09, 2023. FINDINGS: Cardiovascular: Atherosclerosis of thoracic aorta without aneurysm formation. Normal cardiac size. No pericardial effusion. Coronary artery calcifications are noted. Mediastinum/Nodes: No enlarged mediastinal or axillary lymph nodes. Thyroid gland, trachea, and esophagus demonstrate no significant findings. Lungs/Pleura: Emphysematous disease is noted. Minimal right pleural effusion is noted with adjacent subsegmental atelectasis. Left-sided chest tube is external to thoracic space. Mild left hydropneumothorax is noted. Left basilar atelectasis or infiltrate is noted. Volume loss is noted on the left with some degree of mediastinal shift to the left. Upper Abdomen: No acute abnormality. Musculoskeletal: No chest wall mass or suspicious bone lesions identified. IMPRESSION: Left-sided chest tube is completely external to thoracic space. Mild left hydropneumothorax is noted with minimal pneumothorax component seen in apical region. Left basilar atelectasis or infiltrate is again noted, with associated volume loss on the left with associated mediastinal shift to the left. These results will be called to the ordering clinician or representative by the Radiologist Assistant, and communication documented in the PACS or [employer]. Coronary artery calcifications are noted suggesting coronary artery disease. Minimal right pleural effusion is noted with minimal adjacent subsegmental atelectasis. Aortic Atherosclerosis (ICD10-I70.0) and Emphysema (ICD10-J43.9). Electronically Signed   By: Lupita Raider M.D.   On: 05/11/2023 12:41       Dinara Lupu M.D. [facility] 05/13/2023, 10:42 AM  Available  via Epic secure chat 7am-7pm After 7 pm, please refer to night coverage provider listed on amion.

## 2023-05-13 NOTE — Progress Notes (Signed)
 NAME:  Anthony Little, MRN:  [id number], DOB:  1950-06-08, LOS: 6 ADMISSION DATE:  05/07/2023, CONSULTATION DATE:  2/26 REFERRING MD:  Laural Benes, CHIEF COMPLAINT:  empyema, CAP and bacteremia   History of Present Illness:  73 year old male patient typically lives at home with his wife presented to [facility] on 2/24 with chief complaint of progressive shortness of breath, weakness, and cough.  He is typically ambulatory at home but does admit to being more sedentary over the winter. Noted he was starting to feel more short of breath around 2/20 with new cough, and intermittent subjective fever and worsening weakness.  In the emergency room he was found to be tachypneic, had a mild white blood cell count of 11.5, bicarbonate was 19 creatinine 1.54 respiratory viral panel was negative he had a large left loculated pleural effusion, He was admitted by the medical team started on IV ceftriaxone and azithromycin.  He was also found to be in atrial fibrillation with RVR and therefore was started on a Cardizem drip.  Cultures were sent, he was admitted to the intensive care subsequent evaluation is as follows: Blood cultures positive for strep pneumoniae, he underwent a left-sided thoracentesis yielding 1 L of fluid the Gram stain was positive for GPC, post chest x-ray still showing significant volume loss on the left side, he has now been referred to [facility] for sepsis, left-sided empyema, and pneumococcal bacteremia.  Infectious diseases already seen remotely he remains on antibiotics  Pertinent  Medical History  Accessible atrial fibrillation, typically on Eliquis and beta-blockade, hyperlipidemia, CKD stage IIIa, COPD, AAA, history of tobacco abuse, history of alcohol abuse. Raynauds   Significant Hospital Events: Including procedures, antibiotic start and stop dates in addition to other pertinent events   2/24 admitted to [facility] PNA, loculated effusion. Started on ABX. IVFs. Was in AF  w/ RVR.,started on CCB infusion.  2/25 increased WOB still on 3 lpm but had complete opacification of left hemithorax,. Lasix increased.  2/26 seen remotely in consult by ID for strep bacteremia. Felt bacteremia 2/2 CAP. Recommended drainage of pleural space, 5d azith and to cont ctx w/ f/u blood cultures for clearance. Underwent left thoracentesis. Drained 1 liter. Glucose 20, gm stain GPC. Transferred to [facility] for chest tube placement and additional support a left thoracostomy tube was placed on arrival 2/27 hemodynamics remained stable, chest tube output 1300 mL since insertion mild delirium during the at bedtime hours still requiring 10 L chest x-ray suggesting significant component of atelectasis receiving first dose of pleural lytic therapy, got a second dose of pleural lytics only put out about 90 cc.   2/28 down to 8 L.  Chest x-ray still with marked volume loss.  A total of 1850 output pleural fluid since chest tube insertion,. CT of chest shows the chest tube to be outside the pleural space. New chest tube placed 3/1 tube lytics repeated  3/2 ~700 of of chest tube overnight, despite this a.m. chest x-ray continues to reveal left sided hydropneumothorax with increased pleural fluid component.  Repeat CT chest pending  Interim History / Subjective:  Patient seen sitting up in bedside recliner, he continues to appear severely deconditioned with poor ineffective cough   Objective   Blood pressure 103/64, pulse 77, temperature (!) 97.4 F (36.3 C), temperature source Oral, resp. rate 18, height 5\' 10"  (1.778 m), weight 86.3 kg, SpO2 90%.        Intake/Output Summary (Last 24 hours) at 05/13/2023 1240 Last data  filed at 05/13/2023 1046 Gross per 24 hour  Intake 617.39 ml  Output 1050 ml  Net -432.61 ml   Filed Weights   05/08/23 0050 05/11/23 2309  Weight: 86.5 kg 86.3 kg    Examination: General: Acute on chronically ill appearing severely deconditioned  elderly male sitting up in  bedside recliner in NAD HEENT: [place]/AT, MM pink/moist, PERRL,  Neuro: Alert and intermittently oriented, weak  CV: s1s2 regular rate and rhythm, no murmur, rubs, or gallops,  PULM:  Diminished bilaterally left greater than right, left chest tube in place, on 3L [place], poor ineffective cough  GI: soft, bowel sounds active in all 4 quadrants, non-tender, non-distended, tolerating oral diet but very poor oral intake  Extremities: warm/dry, no edema  Skin: no rashes or lesions  Resolved Hospital Problem list   Sepsis secondary to community-acquired pneumonia   Assessment & Plan:   Acute hypoxic respiratory failure secondary to community-acquired pneumonia (pneumococcal) and empyema, as well as severe mucous plugging with left-sided atelectasis superimposed on Underlying COPD -Patient has now had two chest tubes with pleural lytics instilled  X3. Initially effusion improved with large volume output but as of 3/2 patients CXR has progressed back to near complete whiteout of left hemothorax despite chest tube presence. There is a high concern for mucus plugging in the setting of severe deconditioning that is now complicating patients picture. He has a very weak ineffective cough. With this in mind discussion held with wife and patient regarding next steps. Decision was made to precede with repeat CT chest to gather more information regarding GOC and better assess pleural space.  P: Obtain CT chest  Patients wife wants to avoid any further aggressive interventions, with that said if CT reveal severe mucus plugging or progress empyema tentative plan is to move to comfort care with discharge home with hospice  Continue aggressive pulmonary hygiene as able  Empiric antibiotics  Pending TOC consult  Ensure adequate pain control  Supplemental oxygen   pneumococcal  bacteremia -Seen by infectious disease, lactate was negative, all infectious parameters improving -Repeat blood cultures negative to date   P: Continue PCN-G  Best Practice (right click and "Reselect all SmartList Selections" daily)   Per primary team  Critical care time: NA  Solae Norling D. Harris, NP-C [facility] Personal contact information can be found on Amion  If no contact or response made please call [phone] 05/13/2023, 1:30 PM

## 2023-05-13 NOTE — Significant Event (Signed)
 Rapid Response Event Note   Reason for Call :  BVM in use, "guppy breathing"  Initial Focused Assessment:  Patient on floor after assisted fall post using BSC. Per report patient stood up after having bowel movement, took a few steps, went unresponsive and then was lowered to the floor. Upon arrival, BVM in use by staff, palpable femoral pulse, HR in 80s, BP stable, and patient able to follow commands; O2 reading in 70s. RT arrived and took over use of BVM, O2 improved as well as patient color. Patient moved back to bed with use of lift. Left chest tube remains intact during movement, serosanguineous drainage present in [place].   103/70 (80) HR 84  RR 8 O2 70s BVM  CBG 163  In bed:  115/68 (81) HR 88 RR 20 O2 100% 6L [place]  Event Summary:  MD Notified: Rod Can MD by primary RN Call Time: [phone] Arrival Time: 1509 End Time: 1545  Truddie Crumble, RN

## 2023-05-13 NOTE — Progress Notes (Signed)
 Patient was being assisted from the bedside commode back to bed by this RN, the charge RN, and nurse tech. The patient was using the front wheel walker and during his ambulation had a syncopal episode. The patient was fully assisted to his knees and then down to the floor. Other staff members entered the room. The patient was guppy breathing and was bagged. A rapid response was called. Attending paged.

## 2023-05-13 NOTE — Progress Notes (Signed)
 RT called to bedside by rapid, that pt is being bagged. Upon entering room pt was laying on the floor being bagged by RN x 2, RT immediately took over BVM, with sats in the 70s on a portable pulse ox. After a few minutes pt started becoming more responsive and started following commands, sats at this time came up to 87%. Once pt became more stable, pt was placed back into the bed with a lift assist. Pt was then placed on HFNC ([place]) 6L with sats now at 96%.

## 2023-05-14 DIAGNOSIS — J189 Pneumonia, unspecified organism: Secondary | ICD-10-CM | POA: Diagnosis not present

## 2023-05-14 DIAGNOSIS — A403 Sepsis due to Streptococcus pneumoniae: Secondary | ICD-10-CM

## 2023-05-14 DIAGNOSIS — Z9689 Presence of other specified functional implants: Secondary | ICD-10-CM | POA: Diagnosis not present

## 2023-05-14 DIAGNOSIS — J9601 Acute respiratory failure with hypoxia: Secondary | ICD-10-CM

## 2023-05-14 DIAGNOSIS — J90 Pleural effusion, not elsewhere classified: Secondary | ICD-10-CM | POA: Diagnosis not present

## 2023-05-14 DIAGNOSIS — A419 Sepsis, unspecified organism: Secondary | ICD-10-CM | POA: Diagnosis not present

## 2023-05-14 DIAGNOSIS — T17500A Unspecified foreign body in bronchus causing asphyxiation, initial encounter: Secondary | ICD-10-CM

## 2023-05-14 LAB — CULTURE, BLOOD (ROUTINE X 2)
Culture: NO GROWTH
Culture: NO GROWTH
Special Requests: ADEQUATE
Special Requests: ADEQUATE

## 2023-05-14 LAB — CULTURE, BODY FLUID W GRAM STAIN -BOTTLE: Culture: NO GROWTH

## 2023-05-14 LAB — CYTOLOGY - NON PAP

## 2023-05-14 MED ORDER — REVEFENACIN 175 MCG/3ML IN SOLN
175.0000 ug | Freq: Every day | RESPIRATORY_TRACT | Status: DC
  Administered 2023-05-15: 175 ug via RESPIRATORY_TRACT
  Filled 2023-05-14: qty 3

## 2023-05-14 MED ORDER — ARFORMOTEROL TARTRATE 15 MCG/2ML IN NEBU
15.0000 ug | INHALATION_SOLUTION | Freq: Two times a day (BID) | RESPIRATORY_TRACT | Status: DC
  Administered 2023-05-14 – 2023-05-15 (×2): 15 ug via RESPIRATORY_TRACT
  Filled 2023-05-14 (×2): qty 2

## 2023-05-14 MED ORDER — ACETYLCYSTEINE 20 % IN SOLN
2.0000 mL | Freq: Two times a day (BID) | RESPIRATORY_TRACT | Status: DC
  Administered 2023-05-14 – 2023-05-15 (×3): 2 mL via RESPIRATORY_TRACT
  Filled 2023-05-14 (×3): qty 4

## 2023-05-14 MED ORDER — SODIUM CHLORIDE 0.9 % IV BOLUS
500.0000 mL | Freq: Once | INTRAVENOUS | Status: AC
  Administered 2023-05-14: 500 mL via INTRAVENOUS

## 2023-05-14 MED ORDER — GUAIFENESIN ER 600 MG PO TB12
600.0000 mg | ORAL_TABLET | Freq: Two times a day (BID) | ORAL | Status: DC
Start: 2023-05-14 — End: 2023-05-15
  Administered 2023-05-14 (×2): 600 mg via ORAL
  Filled 2023-05-14 (×2): qty 1

## 2023-05-14 MED ORDER — OXYCODONE-ACETAMINOPHEN 5-325 MG PO TABS: 1.0000 | ORAL_TABLET | Freq: Three times a day (TID) | ORAL | Status: DC | PRN

## 2023-05-14 MED ORDER — BUDESONIDE 0.25 MG/2ML IN SUSP
0.2500 mg | Freq: Two times a day (BID) | RESPIRATORY_TRACT | Status: DC
  Administered 2023-05-14 – 2023-05-15 (×2): 0.25 mg via RESPIRATORY_TRACT
  Filled 2023-05-14 (×2): qty 2

## 2023-05-14 MED ORDER — QUETIAPINE FUMARATE 25 MG PO TABS
12.5000 mg | ORAL_TABLET | Freq: Every day | ORAL | Status: DC
  Administered 2023-05-14: 12.5 mg via ORAL
  Filled 2023-05-14: qty 1

## 2023-05-14 NOTE — TOC Progression Note (Addendum)
 Transition of Care [facility]) - Progression Note    Patient Details  Name: Anthony Little MRN: [id number] Date of Birth: [birth year]  [facility] of [facility] CM/SW Contact  Delilah Shan, LCSWA Phone Number: 05/14/2023, 1:40 PM  Clinical Narrative:     CSW submitted clinicals to [facility] must for review for patients passr. Patients passr pending.   Update- CSW spoke with patients spouse to discuss SNF bed offers. Patients spouse accepted SNF bed offer with [facility]. CSW spoke with Whitney Post with [facility] who confirmed they can offer SNF bed as long as chest tube comes out prior to dc. CSW following to start insurance authorization closer to patient being medically ready for dc.   Expected Discharge Plan: Skilled Nursing Facility Barriers to Discharge: Continued Medical Work up  Expected Discharge Plan and Services In-house Referral: Clinical Social Work Discharge Planning Services: CM Consult Post Acute Care Choice: IP Rehab Living arrangements for the past 2 months: Single Family Home                                       Social Determinants of Health (SDOH) Interventions SDOH Screenings   Food Insecurity: No Food Insecurity (05/08/2023)  Housing: Low Risk  (05/08/2023)  Transportation Needs: No Transportation Needs (05/08/2023)  Utilities: Not At Risk (05/08/2023)  Depression (PHQ2-9): Medium Risk (01/09/2023)  Social Connections: Moderately Integrated (05/08/2023)  Tobacco Use: High Risk (05/07/2023)    Readmission Risk Interventions    05/09/2023   10:27 AM 05/08/2023    9:28 AM  Readmission Risk Prevention Plan  Transportation Screening Complete Complete  Home Care Screening Complete Complete  Medication Review (RN CM) Complete Complete

## 2023-05-14 NOTE — Evaluation (Signed)
 Occupational Therapy Evaluation Patient Details Name: Anthony Little MRN: [id number] DOB: 30-Apr-1950 Today's Date: 05/14/2023   History of Present Illness   73 yo male admitted to [facility] 2/24 with SOB, cough, pleural effusion, Afib with RVR. 2/26 thoracentesis and transfer to [facility] for chest tube placement PMHx: ETOH abuse, HTN, CAD Afib, CKD, depression, tinnitus     Clinical Impressions Pt presents with diagnoses above and deficits below. Per chart, pt lives with spouse though due to AMS, pt unable to provide further background information. Overall, pt requires Mod-Max A for bed mobility, able to maintain sitting balance EOB without assist but limited by dizziness (BP 78/64) requiring return to bed. Once supine, BP improving to 98/69. Pt requires Max-Total A for ADLs d/t current deficits and will need continued inpatient follow up therapy, <3 hours/day at DC prior to return home.   SpO2 90-91% on 6 L O2.     If plan is discharge home, recommend the following:   A lot of help with walking and/or transfers;Two people to help with walking and/or transfers;A lot of help with bathing/dressing/bathroom;Two people to help with bathing/dressing/bathroom     Functional Status Assessment   Patient has had a recent decline in their functional status and demonstrates the ability to make significant improvements in function in a reasonable and predictable amount of time.     Equipment Recommendations   Other (comment) (TBD pending progress)     Recommendations for Other Services         Precautions/Restrictions   Precautions Precautions: Fall;Other (comment) Recall of Precautions/Restrictions: Impaired Precaution/Restrictions Comments: chest tube, watch SPO2/BP, syncopal episode during admission Restrictions Weight Bearing Restrictions Per Provider Order: No     Mobility Bed Mobility Overal bed mobility: Needs Assistance Bed Mobility: Supine to Sit, Sit to Supine,  Rolling Rolling: Mod assist   Supine to sit: Mod assist, Used rails, HOB elevated Sit to supine: Max assist   General bed mobility comments: Able to assist with BLE to EOB w/ use of bed pad to scoot forward. cues to use bedrail and noted fair initiation to assist in lifting trunk. Max A to return to supine due to dizziness. Mod A to roll side to side to adjust bedpads under pt    Transfers                   General transfer comment: deferred due to BP/dizziness      Balance Overall balance assessment: Needs assistance Sitting-balance support: No upper extremity supported, Feet supported Sitting balance-Leahy Scale: Fair                                     ADL either performed or assessed with clinical judgement   ADL Overall ADL's : Needs assistance/impaired Eating/Feeding: Moderate assistance;Bed level   Grooming: Moderate assistance;Sitting   Upper Body Bathing: Maximal assistance;Sitting   Lower Body Bathing: Total assistance;Bed level   Upper Body Dressing : Maximal assistance;Sitting   Lower Body Dressing: Total assistance;Bed level       Toileting- Clothing Manipulation and Hygiene: Total assistance;Bed level               Vision Baseline Vision/History: 1 Wears glasses Ability to See in Adequate Light: 0 Adequate Patient Visual Report: No change from baseline Vision Assessment?: No apparent visual deficits     Perception         Praxis  Pertinent Vitals/Pain Pain Assessment Pain Assessment: No/denies pain     Extremity/Trunk Assessment Upper Extremity Assessment Upper Extremity Assessment: Generalized weakness;Right hand dominant   Lower Extremity Assessment Lower Extremity Assessment: Defer to PT evaluation   Cervical / Trunk Assessment Cervical / Trunk Assessment: Normal   Communication Communication Communication: Impaired Factors Affecting Communication: Reduced clarity of speech   Cognition  Arousal: Alert Behavior During Therapy: Flat affect Cognition: Cognition impaired   Orientation impairments: Time, Situation Awareness: Intellectual awareness impaired, Online awareness impaired Memory impairment (select all impairments): Short-term memory, Working [employer] fast streamer, Engineer, structural memory Attention impairment (select first level of impairment): Sustained attention Executive functioning impairment (select all impairments): Initiation, Organization, Sequencing, Reasoning, Problem solving OT - Cognition Comments: flat affect, follows commands consistently with multimodal cues. low volume when talking, but able to accurately report feeling dizzy. unsure of PLOF reports                 Following commands: Impaired Following commands impaired: Follows one step commands with increased time, Follows multi-step commands inconsistently     Cueing  General Comments   Cueing Techniques: Verbal cues;Gestural cues;Tactile cues;Visual cues      Exercises     Shoulder Instructions      Home Living Family/patient expects to be discharged to:: Private residence Living Arrangements: Spouse/significant other Available Help at Discharge: Available 24 hours/day Type of Home: House Home Access: Stairs to enter [employer] of Steps: 2   Home Layout: One level     Bathroom Shower/Tub: Producer, television/film/video: Standard     Home Equipment: Shower seat - built in   Additional Comments: home setup from PT eval as pt unable to report      Prior Functioning/Environment Prior Level of Function : Patient poor historian/Family not available             Mobility Comments: reports no AD for mobility ADLs Comments: reports wife assisting with showering and dressing (unsure of accuracy)    OT Problem List: Decreased strength;Decreased activity tolerance;Impaired balance (sitting and/or standing);Decreased cognition;Decreased safety awareness   OT  Treatment/Interventions: Self-care/ADL training;Therapeutic exercise;Energy conservation;DME and/or AE instruction;Therapeutic activities;Patient/family education;Balance training      OT Goals(Current goals can be found in the care plan section)   Acute Rehab OT Goals Patient Stated Goal: none stated today OT Goal Formulation: Patient unable to participate in goal setting Time For Goal Achievement: 05/28/23 Potential to Achieve Goals: Fair   OT Frequency:  Min 1X/week    Co-evaluation              AM-PAC OT "6 Clicks" Daily Activity     Outcome Measure Help from another person eating meals?: A Lot Help from another person taking care of personal grooming?: A Lot Help from another person toileting, which includes using toliet, bedpan, or urinal?: Total Help from another person bathing (including washing, rinsing, drying)?: A Lot Help from another person to put on and taking off regular upper body clothing?: A Lot Help from another person to put on and taking off regular lower body clothing?: Total 6 Click Score: 10   End of Session Equipment Utilized During Treatment: Oxygen Nurse Communication: Mobility status  Activity Tolerance: Treatment limited secondary to medical complications (Comment) Patient left: in bed;with call bell/phone within reach;with bed alarm set  OT Visit Diagnosis: Unsteadiness on feet (R26.81);Other abnormalities of gait and mobility (R26.89);Muscle weakness (generalized) (M62.81);Other symptoms and signs involving cognitive function  Time: 1610-9604 OT Time Calculation (min): 20 min Charges:  OT General Charges $OT Visit: 1 Visit OT Evaluation $OT Eval Moderate Complexity: 1 Mod  Bradd Canary, OTR/L Acute Rehab Services Office: [phone]   Lorre Munroe 05/14/2023, 12:47 PM

## 2023-05-14 NOTE — Plan of Care (Signed)

## 2023-05-14 NOTE — Progress Notes (Addendum)
 At shift change RN was called in by pt's wife stating the pt was c/o sob. Pt's pulse ox was in the 60s-80s on 7L hfnc. The RN changed the pt's pulse ox & increased his oxygen to 9L HFNC & the pt 's o2 sat went up to mid 90s. Per pt he felt much better. This RN called respiratory to check on the pt as well. Pt's night shift RN at bedside & aware.Pt looks to be having agonal breathing at times. Night shift RN will continue to monitor the pt.Night shift ocp  S. Sundil notified  Krystyne Tewksbury, Colletta [place], RN

## 2023-05-14 NOTE — IPAL (Signed)
  Interdisciplinary Goals of Care Family Meeting   Date carried out:: 05/14/2023  Location of the meeting: Bedside  Member's involved: Physician and Family Member or next of kin  Durable Power of Attorney or Environmental health practitioner: patient    Discussion: We discussed goals of care for Anthony Little .  [name] and his wife were at bedside this evening.  We discussed his 3 CT scans and minimal improvement in lung aeration and clearance of his left mainstem bronchus. Without direct visualization, still cannot say if this is mucus vs airway obstructing tumor. We discussed 3 options-- con't aggressive care measures trying to clear mucus and con't antibiotics, bronch to suck out mucus or evaluate for possible tumor or other airway obstructing lesion, and comfort care. He elected for comfort care. They both understand that even with a bronch to clear mucus, it does not guarantee his lung will reinflate or that he won't have future episodes of plugging. With a wean, ineffective cough, he is likely to have recurrent mucus plugging. He elected to go home with hospice and wants to be comfortable. His wife will call family and he wants to continue aggressive care overnight, but does not want to continue this indefinitely. He is tired and wants to go home. His wife does not want to start comfort meds quiet yet. I left a message for TOC this evening-- they would like  [facility] in [place] to care for him; his wife formerly worked there.   Code status: Full DNR  Disposition: Continue current acute care; planning for home with hospice as soon as it can be set up   Time spent for the meeting: 30 min.  Steffanie Dunn 05/14/2023, 6:15 PM

## 2023-05-14 NOTE — Progress Notes (Signed)
 Received a page from RN that patient having respiratory distress O2 sat 60 to 80% on 6 L high flow oxygen.  At bedside evaluation patient has rhonchi all over.  After giving the hypertonic saline breathing treatment, nasal and oropharyngeal suctioning O2 sat improved to 94% currently on 9 L oxygen. Continue breathing treatment.  Respiratory at the bedside.  Resume home Brovana, budesonide and Yupelri nebs.  Continue high flow oxygen.  Will avoid BiPAP given patient has chest tube and high risk for development of pneumothorax.  Patient is hemodynamically stable now and alert oriented.  Family at the bedside updated.   Tereasa Coop, MD [facility] 05/14/2023, 9:21 PM

## 2023-05-14 NOTE — Progress Notes (Addendum)
 NAME:  Anthony Little, MRN:  [id number], DOB:  1950-05-11, LOS: 7 ADMISSION DATE:  05/07/2023, CONSULTATION DATE:  2/26 REFERRING MD:  Laural Benes, CHIEF COMPLAINT:  empyema, CAP and bacteremia   History of Present Illness:  73 year old male patient typically lives at home with his wife presented to [facility] on 2/24 with chief complaint of progressive shortness of breath, weakness, and cough.  He is typically ambulatory at home but does admit to being more sedentary over the winter. Noted he was starting to feel more short of breath around 2/20 with new cough, and intermittent subjective fever and worsening weakness.  In the emergency room he was found to be tachypneic, had a mild white blood cell count of 11.5, bicarbonate was 19 creatinine 1.54 respiratory viral panel was negative he had a large left loculated pleural effusion, He was admitted by the medical team started on IV ceftriaxone and azithromycin.  He was also found to be in atrial fibrillation with RVR and therefore was started on a Cardizem drip.  Cultures were sent, he was admitted to the intensive care subsequent evaluation is as follows: Blood cultures positive for strep pneumoniae, he underwent a left-sided thoracentesis yielding 1 L of fluid the Gram stain was positive for GPC, post chest x-ray still showing significant volume loss on the left side, he has now been referred to [facility] for sepsis, left-sided empyema, and pneumococcal bacteremia.  Infectious diseases already seen remotely he remains on antibiotics  Pertinent  Medical History  Accessible atrial fibrillation, typically on Eliquis and beta-blockade, hyperlipidemia, CKD stage IIIa, COPD, AAA, history of tobacco abuse, history of alcohol abuse. Raynauds   Significant Hospital Events: Including procedures, antibiotic start and stop dates in addition to other pertinent events   2/24 admitted to [facility], loculated effusion. Started on ABX. IVFs. Was in AF  w/ RVR.,started on CCB infusion.  2/25 increased WOB still on 3 lpm but had complete opacification of left hemithorax,. Lasix increased.  2/26 seen remotely in consult by ID for strep bacteremia. Felt bacteremia 2/2 CAP. Recommended drainage of pleural space, 5d azith and to cont ctx w/ f/u blood cultures for clearance. Underwent left thoracentesis. Drained 1 liter. Glucose 20, gm stain GPC. Transferred to [facility] for chest tube placement and additional support a left thoracostomy tube was placed on arrival 2/27 hemodynamics remained stable, chest tube output 1300 mL since insertion mild delirium during the at bedtime hours still requiring 10 L chest x-ray suggesting significant component of atelectasis receiving first dose of pleural lytic therapy, got a second dose of pleural lytics only put out about 90 cc.   2/28 down to 8 L.  Chest x-ray still with marked volume loss.  A total of 1850 output pleural fluid since chest tube insertion,. CT of chest shows the chest tube to be outside the pleural space. New chest tube placed 3/1 tube lytics repeated  3/2 ~700 of of chest tube overnight, despite this a.m. chest x-ray continues to reveal left sided hydropneumothorax with increased pleural fluid component.  Repeat CT chest pending CT chest 3/2 > atelectasis L base worse from prior with occluded L mainstem bronchus, mucous plugging RLL, L hydroPTX with chest tube in place  Interim History / Subjective:  On 6-8L O2. CT with 770 output past 24 hours. He is awake but not fully alert. Difficult to understand. CT with occluded L mainstem  Objective   Blood pressure (!) 103/58, pulse 73, temperature (!) 97.4 F (36.3 C),  temperature source Axillary, resp. rate 14, height 5\' 10"  (1.778 m), weight 86.3 kg, SpO2 100%.        Intake/Output Summary (Last 24 hours) at 05/14/2023 1029 Last data filed at 05/14/2023 0356 Gross per 24 hour  Intake 2518.52 ml  Output 1420 ml  Net 1098.52 ml   Filed Weights    05/08/23 0050 05/11/23 2309  Weight: 86.5 kg 86.3 kg    Examination: General: Acute on chronically ill appearing severely deconditioned elderly male, in NAD HEENT: [place]/AT, MM pink/moist, PERRL,  Neuro: Awake, answers basic questions, difficult to understand CV: RRR, no M/R/G PULM:  Diminished bilaterally L > R. Coarse in bases. L chest tube in place GI: soft, bowel sounds active in all 4 quadrants, non-tender, non-distended, tolerating oral diet but very poor oral intake  Extremities: warm/dry, no edema  Skin: no rashes or lesions  Resolved Hospital Problem list   Sepsis secondary to community-acquired pneumonia   Assessment & Plan:   Acute hypoxic respiratory failure secondary to community-acquired pneumonia (pneumococcal) and empyema, as well as severe mucous plugging with left-sided atelectasis superimposed on Underlying COPD. Possibility of L mainstem bronchial lesion is not excluded and can not be delineated from CT chest. -Patient has now had two chest tubes with pleural lytics instilled  X3. Initially effusion improved with large volume output but as of 3/2 patients CXR has progressed back to near complete whiteout of left hemothorax despite chest tube presence. There is a high concern for mucus plugging in the setting of severe deconditioning that is now complicating patients picture; however, we also can not rule out an endobronchial lesion here. P: CT results explained to wife and informed her that we can not be certain whether this is mucus plugging versus endobronchial lesion and that the only way would be to have him undergo bronchoscopy. Unfortunately given his O2 needs and respiratory status, he is at high risk for anesthesia and his wife is unsure whether they would even want invasive measures in the first place. Wife to come to hospital and has asked to discuss options with Dr. Chestine Spore this afternoon. Continue aggressive pulmonary hygiene as able  Empiric antibiotics as  below Continue chest tube to suction with routine chest tube care (interestingly, he has hydropneumothorax despite CT in good position and patent CT with adequate drainage. ? BP fistula at play?) Mobilize if we can get him stronger, currently not able  Pneumococcal  bacteremia -Seen by infectious disease, lactate was negative, all infectious parameters improving -Repeat blood cultures negative to date  P: Continue PCN-G  Best Practice (right click and "Reselect all SmartList Selections" daily)   Per primary team   Rutherford Guys, PA - [facility] For pager details, please see AMION or use Epic chat  After 1900, please call ELINK for cross coverage needs 05/14/2023, 10:43 AM

## 2023-05-14 NOTE — Progress Notes (Signed)
 [facility]  Date of Admission:  05/07/2023       Abx: 2/27-c pcn g  Azith/ceftriaxone  ASSESSMENT: 73 yo male admitted in transfer from [facility] 2/26 for strep pna bacteremia complcated by left empyema/pna  Tte no obvious sign endocarditis  No meningitis or peripheral joint tenderness that is new; chronic mild left knee pain -- ambulatory   Getting tpa trial via left chest tube placed 2/26  3/3 id assessment A new chest tube placed 3/1; drainig well; last dose tpa 3/1 Chest ct 3/2 with mucus plugging  Pulm to discuss with family regarding bronchoscopy  If pleural effusion with tpa/chest tube is not successful likely not a surgical candidate   PLAN: Continue pcn g Appreciate pulm help with management Standard universal isolation precaution Abx duration at this time remains a moving target Discussed with primary team   Principal Problem:   Sepsis due to pneumonia (HCC) Active Problems:   Mixed hyperlipidemia   COPD (chronic obstructive pulmonary disease) (HCC)   Pleural effusion on left   Paroxysmal atrial fibrillation (HCC)   Empyema (HCC)   Acute respiratory failure with hypoxia (HCC)   Pneumonia of left lung due to Streptococcus pneumoniae (HCC)   Allergies  Allergen Reactions   Ivp Dye [Iodinated Contrast Media] Nausea And Vomiting    Scheduled Meds:  acetylcysteine  2 mL Nebulization BID   Chlorhexidine Gluconate Cloth  6 each Topical Daily   dextromethorphan-guaiFENesin  1 tablet Oral BID   diltiazem  30 mg Oral Q6H   feeding supplement  1 Container Oral BID BM   folic acid  1 mg Oral Daily   heparin injection (subcutaneous)  5,000 Units Subcutaneous Q8H   mouth rinse  15 mL Mouth Rinse 4 times per day   polyethylene glycol  17 g Oral Daily   QUEtiapine  25 mg Oral QHS   rosuvastatin  10 mg Oral Daily   senna-docusate  1 tablet Oral Daily   sertraline  50 mg Oral Daily   Continuous Infusions:  sodium  chloride 100 mL/hr at 05/13/23 1902   penicillin G potassium 9 Million Units in dextrose 5 % 500 mL CONTINUOUS infusion 41.7 mL/hr at 05/14/23 1100   PRN Meds:.acetaminophen **OR** acetaminophen, fentaNYL (SUBLIMAZE) injection, ipratropium-albuterol, [DISCONTINUED] ondansetron **OR** ondansetron (ZOFRAN) IV, mouth rinse, oxyCODONE-acetaminophen   SUBJECTIVE: Afebrile  3/1 chest tube exchanged  3/2 chest ct reviewed  Patient remains debilitated but doesn't complain of chest pain  Remains on moderate amount of o2 supplement via [place]   Review of Systems: ROS All other ROS was negative, except mentioned above     OBJECTIVE: Vitals:   05/13/23 2014 05/14/23 0420 05/14/23 0742 05/14/23 1036  BP: 90/67 (!) 103/58  106/65  Pulse: 73   86  Resp:  12 14 20   Temp:   (!) 97.4 F (36.3 C)   TempSrc:   Axillary   SpO2: 100%   95%  Weight:      Height:       Body mass index is 27.3 kg/m.  Physical Exam  General/constitutional: ill appearing, some verbals, cooperative HEENT: Normocephalic CV: rrr no mrg Lungs: normal respiratory effort; no bs left side; chest tube to suction serosanguinous output; no airleak Abd: Soft, Nontender Ext: no edema Skin: No Rash Neuro: generalized weakness MSK: no peripheral joint swelling/tenderness/warmth; back spines nontender  Lab Results Lab Results  Component Value Date   WBC 9.1 05/13/2023   HGB  10.2 (L) 05/13/2023   HCT 30.3 (L) 05/13/2023   MCV 104.5 (H) 05/13/2023   PLT 127 (L) 05/13/2023    Lab Results  Component Value Date   CREATININE 2.13 (H) 05/13/2023   BUN 40 (H) 05/13/2023   NA 128 (L) 05/13/2023   K 4.3 05/13/2023   CL 88 (L) 05/13/2023   CO2 23 05/13/2023    Lab Results  Component Value Date   ALT 39 05/13/2023   AST 57 (H) 05/13/2023   ALKPHOS 151 (H) 05/13/2023   BILITOT 0.5 05/13/2023      Microbiology: Recent Results (from the past 240 hours)  Resp panel by RT-PCR (RSV, Flu A&B, Covid) Anterior Nasal  Swab     Status: None   Collection Time: 05/07/23  7:41 PM   Specimen: Anterior Nasal Swab  Result Value Ref Range Status   SARS Coronavirus 2 by RT PCR NEGATIVE NEGATIVE Final    Comment: (NOTE) SARS-CoV-2 target nucleic acids are NOT DETECTED.  The SARS-CoV-2 RNA is generally detectable in upper respiratory specimens during the acute phase of infection. The lowest concentration of SARS-CoV-2 viral copies this assay can detect is 138 copies/mL. A negative result does not preclude SARS-Cov-2 infection and should not be used as the sole basis for treatment or other patient management decisions. A negative result may occur with  improper specimen collection/handling, submission of specimen other than nasopharyngeal swab, presence of viral mutation(s) within the areas targeted by this assay, and inadequate number of viral copies(<138 copies/mL). A negative result must be combined with clinical observations, patient history, and epidemiological information. The expected result is Negative.  Fact Sheet for Patients:  BloggerCourse.com  Fact Sheet for Healthcare Providers:  SeriousBroker.it  This test is no t yet approved or cleared by the [place] FDA and  has been authorized for detection and/or diagnosis of SARS-CoV-2 by FDA under an Emergency Use Authorization (EUA). This EUA will remain  in effect (meaning this test can be used) for the duration of the COVID-19 declaration under Section 564(b)(1) of the Act, 21 U.S.C.section 360bbb-3(b)(1), unless the authorization is terminated  or revoked sooner.       Influenza A by PCR NEGATIVE NEGATIVE Final   Influenza B by PCR NEGATIVE NEGATIVE Final    Comment: (NOTE) The Xpert Xpress SARS-CoV-2/FLU/RSV plus assay is intended as an aid in the diagnosis of influenza from Nasopharyngeal swab specimens and should not be used as a sole basis for treatment. Nasal washings and aspirates  are unacceptable for Xpert Xpress SARS-CoV-2/FLU/RSV testing.  Fact Sheet for Patients: BloggerCourse.com  Fact Sheet for Healthcare Providers: SeriousBroker.it  This test is not yet approved or cleared by the [place] FDA and has been authorized for detection and/or diagnosis of SARS-CoV-2 by FDA under an Emergency Use Authorization (EUA). This EUA will remain in effect (meaning this test can be used) for the duration of the COVID-19 declaration under Section 564(b)(1) of the Act, 21 U.S.C. section 360bbb-3(b)(1), unless the authorization is terminated or revoked.     Resp Syncytial Virus by PCR NEGATIVE NEGATIVE Final    Comment: (NOTE) Fact Sheet for Patients: BloggerCourse.com  Fact Sheet for Healthcare Providers: SeriousBroker.it  This test is not yet approved or cleared by the [place] FDA and has been authorized for detection and/or diagnosis of SARS-CoV-2 by FDA under an Emergency Use Authorization (EUA). This EUA will remain in effect (meaning this test can be used) for the duration of the COVID-19 declaration under Section 564(b)(1)  of the Act, 21 U.S.C. section 360bbb-3(b)(1), unless the authorization is terminated or revoked.  Performed at [facility], [address], [place], [place] [address]   Blood Culture (routine x 2)     Status: Abnormal   Collection Time: 05/07/23 10:40 PM   Specimen: BLOOD LEFT ARM  Result Value Ref Range Status   Specimen Description   Final    BLOOD LEFT ARM Performed at [facility], [address] [place], [place] [address]    Special Requests   Final    BOTTLES DRAWN AEROBIC AND ANAEROBIC Blood Culture adequate volume Performed at [facility], [address] [place], [place] [address]    Culture  Setup Time   Final    GRAM POSITIVE COCCI IN BOTH AEROBIC AND ANAEROBIC BOTTLES Gram Stain Report Called to,Read  Back By and Verified With: Melvern Banker, RN AT 1041 05/08/23 BY Kandice Moos Performed at [facility], [address], [place], [place] [address]    Culture (A)  Final    STREPTOCOCCUS PNEUMONIAE SUSCEPTIBILITIES PERFORMED ON PREVIOUS CULTURE WITHIN THE LAST 5 DAYS. Performed at [facility], [address], [place], [place] [address]    Report Status 05/10/2023 FINAL  Final  Blood Culture (routine x 2)     Status: Abnormal   Collection Time: 05/07/23 10:42 PM   Specimen: BLOOD LEFT ARM  Result Value Ref Range Status   Specimen Description   Final    BLOOD LEFT ARM Performed at [facility], [address], [place], [place] [address]    Special Requests   Final    BOTTLES DRAWN AEROBIC AND ANAEROBIC Blood Culture adequate volume Performed at [facility], [address], [place], [place] [address]    Culture  Setup Time   Final    GRAM POSITIVE COCCI IN BOTH AEROBIC AND ANAEROBIC BOTTLES Gram Stain Report Called to,Read Back By and Verified With: EDGAR TINAJERO,RN AT 1041 05/08/23 BY A. SNYDER CRITICAL RESULT CALLED TO, READ BACK BY AND VERIFIED WITH: RN Marigene Ehlers [phone] @1912  FH Performed at [facility], [address], [place], [place] [address]    Culture STREPTOCOCCUS PNEUMONIAE (A)  Final   Report Status 05/10/2023 FINAL  Final   Organism ID, Bacteria STREPTOCOCCUS PNEUMONIAE  Final      Susceptibility   Streptococcus pneumoniae - MIC*    ERYTHROMYCIN <=0.12 SENSITIVE Sensitive     LEVOFLOXACIN 1 SENSITIVE Sensitive     VANCOMYCIN 0.5 SENSITIVE Sensitive     PENICILLIN (meningitis) <=0.06 SENSITIVE Sensitive     PENO - penicillin <=0.06      PENICILLIN (non-meningitis) <=0.06 SENSITIVE Sensitive     PENICILLIN (oral) <=0.06 SENSITIVE Sensitive     CEFTRIAXONE (non-meningitis) <=0.12 SENSITIVE Sensitive     CEFTRIAXONE (meningitis) <=0.12 SENSITIVE Sensitive     * STREPTOCOCCUS PNEUMONIAE  Blood Culture ID Panel (Reflexed)     Status: Abnormal    Collection Time: 05/07/23 10:42 PM  Result Value Ref Range Status   Enterococcus faecalis NOT DETECTED NOT DETECTED Final   Enterococcus Faecium NOT DETECTED NOT DETECTED Final   Listeria monocytogenes NOT DETECTED NOT DETECTED Final   Staphylococcus species NOT DETECTED NOT DETECTED Final   Staphylococcus aureus (BCID) NOT DETECTED NOT DETECTED Final   Staphylococcus epidermidis NOT DETECTED NOT DETECTED Final   Staphylococcus lugdunensis NOT DETECTED NOT DETECTED Final   Streptococcus species DETECTED (A) NOT DETECTED Final    Comment: CRITICAL RESULT CALLED TO, READ BACK  BY AND VERIFIED WITH: RN A. RONE [phone] @1912  FH`    Streptococcus agalactiae NOT DETECTED NOT DETECTED Final   Streptococcus pneumoniae DETECTED (A) NOT DETECTED Final    Comment: CRITICAL RESULT CALLED TO, READ BACK BY AND VERIFIED WITH: RN A. RONE [phone] @1912  FH    Streptococcus pyogenes NOT DETECTED NOT DETECTED Final   A.calcoaceticus-baumannii NOT DETECTED NOT DETECTED Final   Bacteroides fragilis NOT DETECTED NOT DETECTED Final   Enterobacterales NOT DETECTED NOT DETECTED Final   Enterobacter cloacae complex NOT DETECTED NOT DETECTED Final   Escherichia coli NOT DETECTED NOT DETECTED Final   Klebsiella aerogenes NOT DETECTED NOT DETECTED Final   Klebsiella oxytoca NOT DETECTED NOT DETECTED Final   Klebsiella pneumoniae NOT DETECTED NOT DETECTED Final   Proteus species NOT DETECTED NOT DETECTED Final   Salmonella species NOT DETECTED NOT DETECTED Final   Serratia marcescens NOT DETECTED NOT DETECTED Final   Haemophilus influenzae NOT DETECTED NOT DETECTED Final   Neisseria meningitidis NOT DETECTED NOT DETECTED Final   Pseudomonas aeruginosa NOT DETECTED NOT DETECTED Final   Stenotrophomonas maltophilia NOT DETECTED NOT DETECTED Final   Candida albicans NOT DETECTED NOT DETECTED Final   Candida auris NOT DETECTED NOT DETECTED Final   Candida glabrata NOT DETECTED NOT DETECTED Final   Candida krusei NOT  DETECTED NOT DETECTED Final   Candida parapsilosis NOT DETECTED NOT DETECTED Final   Candida tropicalis NOT DETECTED NOT DETECTED Final   Cryptococcus neoformans/gattii NOT DETECTED NOT DETECTED Final    Comment: Performed at [facility], [address], [place], [place] [address]  MRSA Next Gen by PCR, Nasal     Status: None   Collection Time: 05/08/23 12:40 AM   Specimen: Nasal Mucosa; Nasal Swab  Result Value Ref Range Status   MRSA by PCR Next Gen NOT DETECTED NOT DETECTED Final    Comment: (NOTE) The GeneXpert MRSA Assay (FDA approved for NASAL specimens only), is one component of a comprehensive MRSA colonization surveillance program. It is not intended to diagnose MRSA infection nor to guide or monitor treatment for MRSA infections. Test performance is not FDA approved in patients less than 20 years old. Performed at [facility], [address], [place], [place] [address]   Culture, blood (Routine X 2) w Reflex to ID Panel     Status: None   Collection Time: 05/09/23  4:14 AM   Specimen: BLOOD  Result Value Ref Range Status   Specimen Description BLOOD BLOOD LEFT ARM  Final   Special Requests   Final    BOTTLES DRAWN AEROBIC AND ANAEROBIC Blood Culture adequate volume   Culture   Final    NO GROWTH 5 DAYS Performed at [facility], [address] [place], [place] [address]    Report Status 05/14/2023 FINAL  Final  Culture, blood (Routine X 2) w Reflex to ID Panel     Status: None   Collection Time: 05/09/23  4:16 AM   Specimen: BLOOD  Result Value Ref Range Status   Specimen Description BLOOD BLOOD LEFT HAND  Final   Special Requests   Final    BOTTLES DRAWN AEROBIC AND ANAEROBIC Blood Culture adequate volume   Culture   Final    NO GROWTH 5 DAYS Performed at [facility], [address] [place], [place] [address]    Report Status 05/14/2023 FINAL  Final  Gram stain     Status: None   Collection Time: 05/09/23 12:55 PM   Specimen: Pleura  Result Value Ref  Range Status   Specimen Description PLEURAL  Final   Special Requests NONE  Final   Gram Stain   Final    PLEURAL GRAM POSITIVE COCCI WBC PRESENT, PREDOMINANTLY PMN CYTOSPIN SMEAR Gram Stain Report Called to,Read Back By and Verified With: Quin Hoop, RN AT [phone] 05/09/23 BY Kandice Moos Performed at [facility], [address] [place], [place] [address]    Report Status 05/09/2023 FINAL  Final  Culture, body fluid w Gram Stain-bottle     Status: None   Collection Time: 05/09/23 12:55 PM   Specimen: Pleura  Result Value Ref Range Status   Specimen Description PLEURAL  Final   Special Requests BOTTLES DRAWN AEROBIC AND ANAEROBIC 10CC  Final   Culture   Final    NO GROWTH 5 DAYS Performed at [facility], [address] [place], [place] [address]    Report Status 05/14/2023 FINAL  Final     Serology:   Imaging: If present, new imagings (plain films, ct scans, and mri) have been personally visualized and interpreted; radiology reports have been reviewed. Decision making incorporated into the Impression / Recommendations.  2/27 cxr Stable position of left-sided chest tube. Continued near complete opacification of left hemithorax secondary to probable combination of pleural effusion and atelectasis with reduced amount of aerated lung compared to prior exam   3/2 chest ct 1. Complete atelectasis of the left lung, progressed from 05/11/2023. The left mainstem bronchus is occluded. 2. Bronchial wall thickening and mucous plugging in the right lower lobe. 3. Repositioned left chest tube now within the gaseous component of the left hydropneumothorax. The gaseous component has increased in fluid component decreased compared with 05/11/2023. 4. Small right pleural effusion and basilar atelectasis.  Raymondo Band, MD [facility] [phone] pager    05/14/2023, 11:32 AM

## 2023-05-14 NOTE — Progress Notes (Signed)
 [facility]                                                                              Kevontay Gildersleeve, is a 73 y.o. male, DOB - [birth year], YQM:[id number] Admit date - 05/07/2023    Outpatient Primary MD for the patient is Luking, Jonna Coup, MD  LOS - 7  days  Chief Complaint  Patient presents with   Shortness of Breath       Brief summary  73 year old male patient typically lives at home with his wife presented to [facility] on 2/24 with chief complaint of progressive shortness of breath, weakness, and cough. He is typically ambulatory at home but does admit to being more sedentary over the winter. Noted he was starting to feel more short of breath around 2/20 with new cough, and intermittent subjective fever and worsening weakness. In the emergency room he was found to be tachypneic, had a mild white blood cell count of 11.5, bicarbonate was 19 creatinine 1.54 respiratory viral panel was negative. He had a large left loculated pleural effusion, He was admitted by the medical team started on IV ceftriaxone and azithromycin. He was also found to be in atrial fibrillation with RVR and therefore was started on a Cardizem drip. Cultures were sent, he was admitted to the intensive care subsequent evaluation is as follows: Blood cultures positive for strep pneumoniae, he underwent a left-sided thoracentesis yielding 1 L of fluid the Gram stain was positive for GPC, post chest x-ray still showing significant volume loss on the left side, he has now been referred to [facility] for sepsis, left-sided empyema, and pneumococcal bacteremia. Infectious diseases already seen remotely he remains on antibiotics.    Significant Hospital Events:  2/24 admitted to [facility] PNA, loculated effusion. Started on ABX. IVFs. Was in AF w/ RVR.,started on CCB infusion.  2/25 increased WOB still on 3 lpm but had complete opacification of left hemithorax,. Lasix increased.  2/26 seen  remotely in consult by ID for strep bacteremia. Felt bacteremia 2/2 CAP. Recommended drainage of pleural space, 5d azith and to cont ctx w/ f/u blood cultures for clearance. Underwent left thoracentesis. Drained 1 liter. Glucose 20, gm stain GPC. Transferred to [facility] for chest tube placement and additional support a left thoracostomy tube was placed on arrival 2/27 hemodynamics remained stable, chest tube output 1300 mL since insertion mild delirium during the at bedtime hours still requiring 10 L chest x-ray suggesting significant component of atelectasis receiving first dose of pleural lytic therapy, got a second dose of pleural lytics only put out about 90 cc.   2/28 down to 8 L.  Chest x-ray still with marked volume loss.  A total of 1850 output pleural fluid since chest tube insertion, getting CT of chest 3/1 transferred to [facility] team and PCCM following.   Assessment & Plan  Principal Problem: Sepsis secondary to pneumonia, POA Acute hypoxic respiratory failure secondary to pneumococcal pneumonia, empyema Severe mucous plugging with left-sided atelectasis on underlying COPD -On 6 L O2 via [place], wean as tolerated  -Ceftriaxone and Zithromax 2/24-2/27 -Vancomycin 2/25>> penicillin G 2/28>> per ID. -Chest  tubes in place, PCCM following for management -Continue nebs, hypertonic saline, Brovana, budesonide, Yupelri nebs -CT chest on 3/2 with increased left lung atelectasis, mucous plugging and increased gas of hydropneumothorax -Patient's wife to meet with pulmonology about the next plan  Pneumococcal bacteremia: TTE without definite vegetation but limited study.  Repeat blood cultures NGTD on 2/26. -On penicillin G per ID   A-fib RVR: Rate controlled. -Continue Cardizem 30 mg every 6 hours with holding parameters -Holding anticoagulation while getting pleural lytics.   Delirium, sundowning: Awake and alert but only oriented to self.  Follows commands. -Delirium precaution -Continue Seroquel    Acute on CKD stage IIIa: Baseline Cr ~1.6. Recent Labs    12/28/22 1018 12/29/22 0408 05/07/23 2012 05/08/23 0548 05/09/23 0819 05/09/23 2010 05/10/23 0239 05/11/23 0303 05/12/23 0435 05/13/23 0402  BUN 23 22 26* 29* 34* 33* 37* 35* 40* 40*  CREATININE 2.28* 1.62* 1.54* 1.34* 1.59* 1.60* 1.73* 1.47* 1.74* 2.13*  -Follow morning labs.  Chronic hyponatremia: Baseline 131-1 35.  Last sodium 128 but stable. -Follow-up lab results   Hyperlipidemia -Crestor   Depression -Zoloft  Estimated body mass index is 27.3 kg/m as calculated from the following:   Height as of this encounter: 5\' 10"  (1.778 m).   Weight as of this encounter: 86.3 kg.  Code Status: DNR DVT Prophylaxis:  heparin injection 5,000 Units Start: 05/09/23 2200 Place and maintain sequential compression device Start: 05/08/23 1324   Level of Care:  Progressive Family Communication: None at bedside. Disposition Plan:      Remains inpatient appropriate: Pneumonia, empyema, mucous plugging, bacteremia   Procedures:  Chest tube placement  Consultants:   ID PCCM  Antimicrobials:   Anti-infectives (From admission, onward)    Start     Dose/Rate Route Frequency Ordered Stop   05/11/23 1000  penicillin G potassium 9 Million Units in dextrose 5 % 500 mL CONTINUOUS infusion        9 Million Units 41.7 mL/hr over 12 Hours Intravenous Every 12 hours 05/10/23 1222     05/09/23 1630  vancomycin (VANCOREADY) IVPB 1500 mg/300 mL  Status:  Discontinued        1,500 mg 150 mL/hr over 120 Minutes Intravenous Every 24 hours 05/08/23 1544 05/09/23 0901   05/09/23 1000  cefTRIAXone (ROCEPHIN) 2 g in sodium chloride 0.9 % 100 mL IVPB  Status:  Discontinued        2 g 200 mL/hr over 30 Minutes Intravenous Every 24 hours 05/09/23 0901 05/10/23 1222   05/08/23 1630  vancomycin (VANCOREADY) IVPB 1750 mg/350 mL        1,750 mg 175 mL/hr over 120 Minutes Intravenous  Once 05/08/23 1544 05/08/23 1845   05/08/23 1000   cefTRIAXone (ROCEPHIN) 1 g in sodium chloride 0.9 % 100 mL IVPB  Status:  Discontinued        1 g 200 mL/hr over 30 Minutes Intravenous Every 24 hours 05/08/23 0646 05/09/23 0901   05/08/23 1000  azithromycin (ZITHROMAX) 500 mg in sodium chloride 0.9 % 250 mL IVPB  Status:  Discontinued        500 mg 250 mL/hr over 60 Minutes Intravenous Every 24 hours 05/08/23 0646 05/10/23 1222   05/07/23 2230  cefTRIAXone (ROCEPHIN) 2 g in sodium chloride 0.9 % 100 mL IVPB        2 g 200 mL/hr over 30 Minutes Intravenous Once 05/07/23 2220 05/07/23 2321   05/07/23 2230  azithromycin (ZITHROMAX) 500 mg in sodium chloride 0.9 %  250 mL IVPB        500 mg 250 mL/hr over 60 Minutes Intravenous  Once 05/07/23 2220 05/08/23 0005          Medications  acetylcysteine  2 mL Nebulization BID   Chlorhexidine Gluconate Cloth  6 each Topical Daily   diltiazem  30 mg Oral Q6H   feeding supplement  1 Container Oral BID BM   folic acid  1 mg Oral Daily   guaiFENesin  600 mg Oral BID   heparin injection (subcutaneous)  5,000 Units Subcutaneous Q8H   mouth rinse  15 mL Mouth Rinse 4 times per day   polyethylene glycol  17 g Oral Daily   QUEtiapine  12.5 mg Oral QHS   rosuvastatin  10 mg Oral Daily   senna-docusate  1 tablet Oral Daily   sertraline  50 mg Oral Daily      Subjective:  Seen and examined earlier this morning.  No major events overnight of this morning.  Patient is awake and alert but only oriented to self.  He follows commands.  Not a great historian.  Denies pain.  Objective:   Vitals:   05/14/23 0942 05/14/23 1036 05/14/23 1226 05/14/23 1341  BP: 106/65 106/65 113/68 116/77  Pulse:  86 83   Resp:  20 (!) 22   Temp:   (!) 97.5 F (36.4 C)   TempSrc:   Oral   SpO2:  95% 99%   Weight:      Height:        Intake/Output Summary (Last 24 hours) at 05/14/2023 1402 Last data filed at 05/14/2023 1100 Gross per 24 hour  Intake 3558.79 ml  Output 1890 ml  Net 1668.79 ml     Wt Readings  from Last 3 Encounters:  05/11/23 86.3 kg  01/09/23 88.1 kg  12/28/22 87.5 kg     Exam GENERAL: No apparent distress.  Nontoxic. HEENT: MMM.  Vision and hearing grossly intact.  NECK: Supple.  No apparent JVD.  RESP:  No IWOB.  Fair aeration bilaterally.  Chest tube in place. CVS: Irregular rhythm.  Normal rate.  Heart sounds normal.  ABD/GI/GU: BS+. Abd soft, NTND.  MSK/EXT:   No apparent deformity. Moves extremities.  Trace BLE edema. SKIN: no apparent skin lesion or wound NEURO: Awake and alert.  Oriented to self.  Follows commands.  No apparent focal neuro deficit. PSYCH: Calm. Normal affect.     Data Reviewed:  I have personally reviewed following labs    CBC Lab Results  Component Value Date   WBC 9.1 05/13/2023   RBC 2.90 (L) 05/13/2023   HGB 10.2 (L) 05/13/2023   HCT 30.3 (L) 05/13/2023   MCV 104.5 (H) 05/13/2023   MCH 35.2 (H) 05/13/2023   PLT 127 (L) 05/13/2023   MCHC 33.7 05/13/2023   RDW 13.3 05/13/2023   LYMPHSABS 1.3 09/27/2022   MONOABS 0.4 02/13/2022   EOSABS 0.3 09/27/2022   BASOSABS 0.1 09/27/2022     Last metabolic panel Lab Results  Component Value Date   NA 128 (L) 05/13/2023   K 4.3 05/13/2023   CL 88 (L) 05/13/2023   CO2 23 05/13/2023   BUN 40 (H) 05/13/2023   CREATININE 2.13 (H) 05/13/2023   GLUCOSE 108 (H) 05/13/2023   GFRNONAA 32 (L) 05/13/2023   GFRAA 38 (L) 03/15/2020   CALCIUM 8.2 (L) 05/13/2023   PHOS 3.7 05/08/2023   PROT 5.3 (L) 05/13/2023   ALBUMIN <1.5 (L) 05/13/2023   LABGLOB  3.0 07/17/2022   AGRATIO 1.4 07/17/2022   BILITOT 0.5 05/13/2023   ALKPHOS 151 (H) 05/13/2023   AST 57 (H) 05/13/2023   ALT 39 05/13/2023   ANIONGAP 17 (H) 05/13/2023    CBG (last 3)  Recent Labs    05/13/23 1515  GLUCAP 163*      Coagulation Profile: Recent Labs  Lab 05/07/23 2112  INR 1.2     Radiology Studies: I have personally reviewed the imaging studies  CT CHEST WO CONTRAST Result Date: 05/13/2023 CLINICAL DATA:   Portable effusion, shortness of breath EXAM: CT CHEST WITHOUT CONTRAST TECHNIQUE: Multidetector CT imaging of the chest was performed following the standard protocol without IV contrast. RADIATION DOSE REDUCTION: This exam was performed according to the departmental dose-optimization program which includes automated exposure control, adjustment of the mA and/or kV according to patient size and/or use of iterative reconstruction technique. COMPARISON:  Radiograph 05/13/2023 and CT 05/11/2023 FINDINGS: Cardiovascular: Coronary artery and aortic atherosclerotic calcification. Trace pericardial effusion. Mediastinum/Nodes: Layering debris in the trachea extending into both mainstem bronchi. The left mainstem bronchus is occluded with debris which extends into the lobar and segmental branches. Esophagus is unremarkable. Similar shotty mediastinal lymph nodes. Lungs/Pleura: Centrilobular and paraseptal emphysema greatest in the upper lungs. Diffuse bronchial wall thickening. The left mainstem bronchus and lobar and segmental branches are occluded with debris. Additional mucous plugs in the right lower lobe segmental and subsegmental bronchi. Complete atelectasis of the left lung, progressed from 05/11/2023. Repositioned left chest tube in the gaseous component of the anterolateral hydropneumothorax. Small posterior fluid component. Small right pleural effusion and basilar atelectasis. Upper Abdomen: No acute abnormality. Musculoskeletal: No acute fracture. IMPRESSION: 1. Complete atelectasis of the left lung, progressed from 05/11/2023. The left mainstem bronchus is occluded. 2. Bronchial wall thickening and mucous plugging in the right lower lobe. 3. Repositioned left chest tube now within the gaseous component of the left hydropneumothorax. The gaseous component has increased in fluid component decreased compared with 05/11/2023. 4. Small right pleural effusion and basilar atelectasis. Aortic Atherosclerosis  (ICD10-I70.0) and Emphysema (ICD10-J43.9). Electronically Signed   By: Minerva Fester M.D.   On: 05/13/2023 22:27   DG CHEST PORT 1 VIEW Result Date: 05/13/2023 CLINICAL DATA:  Acute respiratory failure. EXAM: PORTABLE CHEST 1 VIEW COMPARISON:  Yesterday FINDINGS: Left-sided pleural pigtail catheter is unchanged in position. Patient rotation left. Mild cardiomegaly. Worsened left-sided aeration, with near-complete whiteout of the left hemithorax. Again identified is a small volume of inferolateral left pleural air, decreased. Loculated pleural fluid laterally is increased. The right lung is clear. No right-sided pleural fluid or pneumothorax. IMPRESSION: Left-sided hydropneumothorax with increased pleural fluid component and decrease in inferolateral small volume pleural air component. Similar position of left-sided pleural pigtail catheter. As on yesterday's radiograph, position cannot be confirmed on a single film. A pigtail catheter was present in the left chest wall soft tissues on 05/11/2023 CT. Near-complete whiteout of left hemithorax, progressive atelectasis or infection. Electronically Signed   By: Jeronimo Greaves M.D.   On: 05/13/2023 12:20       Almon Hercules M.D. [facility] 05/14/2023, 2:02 PM  Available via Epic secure chat 7am-7pm After 7 pm, please refer to night coverage provider listed on amion.

## 2023-05-15 ENCOUNTER — Telehealth: Payer: Self-pay | Admitting: Family Medicine

## 2023-05-15 LAB — COMPREHENSIVE METABOLIC PANEL
ALT: 39 U/L (ref 0–44)
AST: 53 U/L — ABNORMAL HIGH (ref 15–41)
Albumin: 1.5 g/dL — ABNORMAL LOW (ref 3.5–5.0)
Alkaline Phosphatase: 141 U/L — ABNORMAL HIGH (ref 38–126)
Anion gap: 6 (ref 5–15)
BUN: 33 mg/dL — ABNORMAL HIGH (ref 8–23)
CO2: 24 mmol/L (ref 22–32)
Calcium: 8.1 mg/dL — ABNORMAL LOW (ref 8.9–10.3)
Chloride: 97 mmol/L — ABNORMAL LOW (ref 98–111)
Creatinine, Ser: 1.8 mg/dL — ABNORMAL HIGH (ref 0.61–1.24)
GFR, Estimated: 40 mL/min — ABNORMAL LOW (ref >60)
Glucose, Bld: 100 mg/dL — ABNORMAL HIGH (ref 70–99)
Potassium: 4.6 mmol/L (ref 3.5–5.1)
Sodium: 127 mmol/L — ABNORMAL LOW (ref 135–145)
Total Bilirubin: 0.7 mg/dL (ref 0.0–1.2)
Total Protein: 5.4 g/dL — ABNORMAL LOW (ref 6.5–8.1)

## 2023-05-15 LAB — CBC
HCT: 34.6 % — ABNORMAL LOW (ref 39.0–52.0)
Hemoglobin: 11 g/dL — ABNORMAL LOW (ref 13.0–17.0)
MCH: 34.2 pg — ABNORMAL HIGH (ref 26.0–34.0)
MCHC: 31.8 g/dL (ref 30.0–36.0)
MCV: 107.5 fL — ABNORMAL HIGH (ref 80.0–100.0)
Platelets: 104 10*3/uL — ABNORMAL LOW (ref 150–400)
RBC: 3.22 MIL/uL — ABNORMAL LOW (ref 4.22–5.81)
RDW: 13.5 % (ref 11.5–15.5)
WBC: 11.5 10*3/uL — ABNORMAL HIGH (ref 4.0–10.5)
nRBC: 0.3 % — ABNORMAL HIGH (ref 0.0–0.2)

## 2023-05-15 LAB — MAGNESIUM: Magnesium: 2.2 mg/dL (ref 1.7–2.4)

## 2023-05-15 MED ORDER — GLYCOPYRROLATE 0.2 MG/ML IJ SOLN
0.4000 mg | INTRAMUSCULAR | Status: AC
  Administered 2023-05-15: 0.4 mg via INTRAVENOUS
  Filled 2023-05-15: qty 2

## 2023-05-15 MED ORDER — LORAZEPAM 2 MG/ML IJ SOLN: 1.0000 mg | INTRAMUSCULAR | Status: DC | PRN

## 2023-05-15 MED ORDER — LORAZEPAM 2 MG/ML PO CONC: 1.0000 mg | ORAL | Status: DC | PRN

## 2023-05-15 MED ORDER — LORAZEPAM 2 MG/ML IJ SOLN
0.5000 mg | INTRAMUSCULAR | Status: AC
  Administered 2023-05-15: 0.5 mg via INTRAVENOUS
  Filled 2023-05-15: qty 1

## 2023-05-15 MED ORDER — GLYCOPYRROLATE 1 MG PO TABS: 1.0000 mg | ORAL_TABLET | ORAL | Status: DC | PRN

## 2023-05-15 MED ORDER — MORPHINE SULFATE (PF) 2 MG/ML IV SOLN
INTRAVENOUS | Status: AC
Start: 2023-05-15 — End: 2023-05-15
  Administered 2023-05-15: 1 mg via INTRAVENOUS
  Filled 2023-05-15: qty 1

## 2023-05-15 MED ORDER — MORPHINE SULFATE (PF) 2 MG/ML IV SOLN: 1.0000 mg | INTRAVENOUS | Status: DC | PRN

## 2023-05-15 MED ORDER — LORAZEPAM 1 MG PO TABS: 1.0000 mg | ORAL_TABLET | ORAL | Status: DC | PRN

## 2023-05-15 MED ORDER — GLYCOPYRROLATE 0.2 MG/ML IJ SOLN: 0.2000 mg | INTRAMUSCULAR | Status: DC | PRN

## 2023-05-17 LAB — MISC LABCORP TEST (SEND OUT): Labcorp test code: 9985

## 2023-06-12 NOTE — TOC Progression Note (Signed)
 Transition of Care [facility]) - Progression Note    Patient Details  Name: Anthony Little MRN: [id number] Date of Birth: [birth year]  [facility] CM/SW Contact  Delilah Shan, LCSWA Phone Number: 05/13/2023, 8:50 AM  Clinical Narrative:     Patients passr has been approved [id number]. Patient has SNF bed at [facility]. CSW following to start insurance authorization close to patient being medically ready for dc. CSW will continue to follow.  Expected Discharge Plan: Skilled Nursing Facility Barriers to Discharge: Continued Medical Work up  Expected Discharge Plan and Services In-house Referral: Clinical Social Work Discharge Planning Services: CM Consult Post Acute Care Choice: IP Rehab Living arrangements for the past 2 months: Single Family Home                                       Social Determinants of Health (SDOH) Interventions SDOH Screenings   Food Insecurity: No Food Insecurity (05/08/2023)  Housing: Low Risk  (05/08/2023)  Transportation Needs: No Transportation Needs (05/08/2023)  Utilities: Not At Risk (05/08/2023)  Depression (PHQ2-9): Medium Risk (01/09/2023)  Social Connections: Moderately Integrated (05/08/2023)  Tobacco Use: High Risk (05/07/2023)    Readmission Risk Interventions    05/09/2023   10:27 AM 05/08/2023    9:28 AM  Readmission Risk Prevention Plan  Transportation Screening Complete Complete  Home Care Screening Complete Complete  Medication Review (RN CM) Complete Complete

## 2023-06-12 NOTE — Progress Notes (Signed)
 Patient left unit to morgue. All belongings sent with spouse.

## 2023-06-12 NOTE — Death Summary Note (Signed)
 DEATH SUMMARY   Patient Details  Name: CLAXTON LEVITZ MRN: [id number] DOB: 1950-07-04 [id number], Jonna Coup, MD Admission/Discharge Information   Admit Date:  20-May-2023  Date of Death: Date of Death: 05-28-23  Time of Death: Time of Death: 1045  Length of Stay: 8   Principle Cause of death: Acute hypoxic respiratory failure secondary to pneumococcal pneumonia, empyema  Hospital Diagnoses:    Sepsis due to pneumonia (HCC)   Empyema (HCC)   Acute respiratory failure with hypoxia (HCC)   Pneumonia of left lung due to Streptococcus pneumoniae (HCC)   Mucus plugging of bronchi   Chest tube in place   Pleural effusion on the left  Paroxysmal atrial fibrillation (HCC) with RVR   Mixed hyperlipidemia   COPD (chronic obstructive pulmonary disease) (HCC)    Chronic hyponatremia  Hospital Course: Patient was a 73 year old male patient presented to [facility] on 05-20-23 with chief complaint of progressive shortness of breath, weakness, and cough.  At baseline was ambulatory at home but does admit to being more sedentary over the winter. Noted he was starting to feel more short of breath around 2/20 with new cough, and intermittent subjective fever and worsening weakness. In the emergency room he was found to be tachypneic, had a mild white blood cell count of 11.5, bicarbonate was 19 creatinine 1.54 respiratory viral panel was negative. He had a large left loculated pleural effusion, He was admitted by the medical team started on IV ceftriaxone and azithromycin. He was also found to be in atrial fibrillation with RVR and therefore was started on a Cardizem drip.   Blood cultures positive for strep pneumoniae, he underwent a left-sided thoracentesis yielding 1 L of fluid the Gram stain was positive for GPC, post chest x-ray still showing significant volume loss on the left side, he has then transferred to [facility] for sepsis, left-sided empyema, and pneumococcal bacteremia.   Infectious disease, critical care was consulted.  Significant Hospital Events:  20-May-2023 admitted to [facility] PNA, loculated effusion. Started on ABX. IVFs. Was in AF w/ RVR.,started on CCB infusion.  2/25 increased WOB still on 3 lpm but had complete opacification of left hemithorax,. Lasix increased.  2/26 seen remotely in consult by ID for strep bacteremia. Felt bacteremia 2/2 CAP. Recommended drainage of pleural space, 5d azith and to cont ctx w/ f/u blood cultures for clearance. Underwent left thoracentesis. Drained 1 liter. Glucose 20, gm stain GPC. Transferred to [facility] for chest tube placement and additional support a left thoracostomy tube was placed on arrival 2/27 hemodynamics remained stable, chest tube output 1300 mL since insertion mild delirium during the at bedtime hours still requiring 10 L chest x-ray suggesting significant component of atelectasis receiving first dose of pleural lytic therapy, got a second dose of pleural lytics only put out about 90 cc.   2/28 down to 8 L.  Chest x-ray still with marked volume loss.  A total of 1850 output pleural fluid since chest tube insertion, getting CT of chest Continued to have mucous plugging with increasing O2 requirements versus endobronchial obstructing lesion, goals of care discussions with patient and wife were done with plan for hospice.  On 05-28-2023, patient was noted to be having worsening respiratory status, palliative medicine was also consulted, patient's wife requested for complete comfort care status  Assessment and Plan:  Sepsis secondary to pneumonia, POA Acute hypoxic respiratory failure secondary to pneumococcal pneumonia, empyema Severe mucous plugging with left-sided atelectasis on underlying COPD versus endobronchial  obstructing lesion -Patient received Ceftriaxone and Zithromax 2/24-2/27 -Infectious disease was consulted, was placed on penicillin G -Chest tube was placed and patient was followed closely by CCM.  Received nebs,  hypertonic saline, Brovana, budesonide, Yupelri nebs -CT chest on 3/2 with increased left lung atelectasis, mucous plugging and increased gas of hydropneumothorax -IPAL goals of care discussions with the CCM, was recommended hospice On June 11, 2023, patient was noted to have tenuous respiratory status, also seen by palliative medicine, goals of care were again discussed, patient's wife requested for full comfort care status.   Pneumococcal bacteremia:  TTE without definite vegetation but limited study.  Repeat blood cultures NGTD on 2/26. -Patient was placed on penicillin G per ID   A-fib RVR: Rate controlled. -Received Cardizem 30 mg every 6 hours with holding parameters -anticoagulation held while getting pleural lytics.   Delirium, sundowning:   Acute on CKD stage IIIa: Baseline Cr ~1.6. -Comfort care status   Chronic hyponatremia: Baseline 131-1 35.  Last sodium 128 but stable. -Comfort care   Hyperlipidemia -Comfort care    Estimated body mass index is 27.3 kg/m as calculated from the following:   Height as of this encounter: 5\' 10"  (1.778 m).   Weight as of this encounter: 86.3 kg.   Code Status: Comfort care, DNR/DNI DVT Prophylaxis:  heparin injection 5,000 Units Start: 05/09/23 2200 Place and maintain sequential compression device Start: 06/04/2023 1324  Patient passed on Jun 11, 2023 at 10:45 AM      Procedures: Thoracentesis, chest tube, 2D echo  Consultations: Infectious disease, CCM  The results of significant diagnostics from this hospitalization (including imaging, microbiology, ancillary and laboratory) are listed below for reference.   Significant Diagnostic Studies: CT CHEST WO CONTRAST Result Date: 05/13/2023 CLINICAL DATA:  Portable effusion, shortness of breath EXAM: CT CHEST WITHOUT CONTRAST TECHNIQUE: Multidetector CT imaging of the chest was performed following the standard protocol without IV contrast. RADIATION DOSE REDUCTION: This exam was performed according  to the departmental dose-optimization program which includes automated exposure control, adjustment of the mA and/or kV according to patient size and/or use of iterative reconstruction technique. COMPARISON:  Radiograph 05/13/2023 and CT 05/11/2023 FINDINGS: Cardiovascular: Coronary artery and aortic atherosclerotic calcification. Trace pericardial effusion. Mediastinum/Nodes: Layering debris in the trachea extending into both mainstem bronchi. The left mainstem bronchus is occluded with debris which extends into the lobar and segmental branches. Esophagus is unremarkable. Similar shotty mediastinal lymph nodes. Lungs/Pleura: Centrilobular and paraseptal emphysema greatest in the upper lungs. Diffuse bronchial wall thickening. The left mainstem bronchus and lobar and segmental branches are occluded with debris. Additional mucous plugs in the right lower lobe segmental and subsegmental bronchi. Complete atelectasis of the left lung, progressed from 05/11/2023. Repositioned left chest tube in the gaseous component of the anterolateral hydropneumothorax. Small posterior fluid component. Small right pleural effusion and basilar atelectasis. Upper Abdomen: No acute abnormality. Musculoskeletal: No acute fracture. IMPRESSION: 1. Complete atelectasis of the left lung, progressed from 05/11/2023. The left mainstem bronchus is occluded. 2. Bronchial wall thickening and mucous plugging in the right lower lobe. 3. Repositioned left chest tube now within the gaseous component of the left hydropneumothorax. The gaseous component has increased in fluid component decreased compared with 05/11/2023. 4. Small right pleural effusion and basilar atelectasis. Aortic Atherosclerosis (ICD10-I70.0) and Emphysema (ICD10-J43.9). Electronically Signed   By: Minerva Fester M.D.   On: 05/13/2023 22:27   DG CHEST PORT 1 VIEW Result Date: 05/13/2023 CLINICAL DATA:  Acute respiratory failure. EXAM: PORTABLE CHEST 1 VIEW COMPARISON:  Yesterday  FINDINGS: Left-sided pleural pigtail catheter is unchanged in position. Patient rotation left. Mild cardiomegaly. Worsened left-sided aeration, with near-complete whiteout of the left hemithorax. Again identified is a small volume of inferolateral left pleural air, decreased. Loculated pleural fluid laterally is increased. The right lung is clear. No right-sided pleural fluid or pneumothorax. IMPRESSION: Left-sided hydropneumothorax with increased pleural fluid component and decrease in inferolateral small volume pleural air component. Similar position of left-sided pleural pigtail catheter. As on yesterday's radiograph, position cannot be confirmed on a single film. A pigtail catheter was present in the left chest wall soft tissues on 05/11/2023 CT. Near-complete whiteout of left hemithorax, progressive atelectasis or infection. Electronically Signed   By: Jeronimo Greaves M.D.   On: 05/13/2023 12:20   DG Chest Port 1 View Result Date: 05/12/2023 CLINICAL DATA:  Pleural effusion. EXAM: PORTABLE CHEST 1 VIEW COMPARISON:  Radiographs 05/11/2023 and 05/10/2023.  CT 05/11/2023. FINDINGS: 0738 hours. Left pleural pigtail catheter is unchanged from the most recent study of yesterday afternoon. The pigtail is incompletely formed, and the catheter appears kinked peripheral to the chest wall. The catheter is peripherally positioned and its position within the pleural space cannot be confirmed based on this single view. Incomplete expansion of the left lung with unchanged left pulmonary opacity and left basilar hydropneumothorax. The right lung is clear. The heart size and mediastinal contours are stable. IMPRESSION: 1. Unchanged appearance of the left pleural pigtail catheter after thoracostomy tube placement/repositioning yesterday. The catheter is peripherally positioned and its position within the pleural space cannot be confirmed based on this single view. It was in the chest wall soft tissues on the CT performed  yesterday morning. 2. Unchanged left pulmonary opacity and left basilar hydropneumothorax. Electronically Signed   By: Carey Bullocks M.D.   On: 05/12/2023 11:38   DG Chest Port 1 View Result Date: 05/11/2023 CLINICAL DATA:  Thoracostomy tube placement EXAM: PORTABLE CHEST 1 VIEW COMPARISON:  05/11/2023 FINDINGS: The left chest tube now projects inside the ribs and has likely been adjusted in the interval. However, patient rotation could account for alteration appearance of the catheter. Tube appears to be kinked. Pleural effusion appears slightly smaller with improved aeration in the left lung although still residual effusion and consolidation present. Loculated pleural gas collection around the tube is mildly increased in the interval. Right lung is clear. Cardiac enlargement. Calcification of the aorta. Degenerative changes in the spine. IMPRESSION: Left chest tube now projects within the ribs, possibly readjusted or artifact due to patient rotation. The tube appears kinked. Small loculated pneumothorax around the tube is mildly increased. Left pleural effusion is decreased and aeration of the left lung is mildly improved. Electronically Signed   By: Burman Nieves M.D.   On: 05/11/2023 16:12   CT CHEST WO CONTRAST Result Date: 05/11/2023 CLINICAL DATA:  Pleural effusion. EXAM: CT CHEST WITHOUT CONTRAST TECHNIQUE: Multidetector CT imaging of the chest was performed following the standard protocol without IV contrast. RADIATION DOSE REDUCTION: This exam was performed according to the departmental dose-optimization program which includes automated exposure control, adjustment of the mA and/or kV according to patient size and/or use of iterative reconstruction technique. COMPARISON:  CT scan of May 09, 2023. FINDINGS: Cardiovascular: Atherosclerosis of thoracic aorta without aneurysm formation. Normal cardiac size. No pericardial effusion. Coronary artery calcifications are noted. Mediastinum/Nodes: No  enlarged mediastinal or axillary lymph nodes. Thyroid gland, trachea, and esophagus demonstrate no significant findings. Lungs/Pleura: Emphysematous disease is noted. Minimal right pleural effusion is noted  with adjacent subsegmental atelectasis. Left-sided chest tube is external to thoracic space. Mild left hydropneumothorax is noted. Left basilar atelectasis or infiltrate is noted. Volume loss is noted on the left with some degree of mediastinal shift to the left. Upper Abdomen: No acute abnormality. Musculoskeletal: No chest wall mass or suspicious bone lesions identified. IMPRESSION: Left-sided chest tube is completely external to thoracic space. Mild left hydropneumothorax is noted with minimal pneumothorax component seen in apical region. Left basilar atelectasis or infiltrate is again noted, with associated volume loss on the left with associated mediastinal shift to the left. These results will be called to the ordering clinician or representative by the Radiologist Assistant, and communication documented in the PACS or [employer] Dashboard. Coronary artery calcifications are noted suggesting coronary artery disease. Minimal right pleural effusion is noted with minimal adjacent subsegmental atelectasis. Aortic Atherosclerosis (ICD10-I70.0) and Emphysema (ICD10-J43.9). Electronically Signed   By: Lupita Raider M.D.   On: 05/11/2023 12:41   DG Chest Port 1 View Result Date: 05/11/2023 CLINICAL DATA:  Follow up atelectasis in patient with sepsis due to pneumonia. EXAM: PORTABLE CHEST 1 VIEW COMPARISON:  Radiograph 05/10/2023 and 05/09/2023.  CT 05/09/2023. FINDINGS: 0529 hours. Small caliber left chest tube is in place with the central coiled component projecting lateral to the left chest wall, external to the pleural space. Persistent volume loss in the left hemithorax with some interval improved aeration of the left lung. Underlying loculated left pleural effusion remains. The right lung is clear. The  visualized heart size and mediastinal contours are stable with aortic atherosclerosis. IMPRESSION: 1. Persistent volume loss in the left hemithorax with some interval improved aeration of the left lung. Underlying loculated left pleural effusion remains. 2. The tip of the left chest tube is external to the pleural space. Recommend repositioning/replacement. 3. These results will be called to the ordering clinician or representative by the Radiologist Assistant, and communication documented in the PACS or [employer]. Electronically Signed   By: Carey Bullocks M.D.   On: 05/11/2023 10:31   DG Chest Port 1 View Result Date: 05/10/2023 CLINICAL DATA:  Atelectasis. EXAM: PORTABLE CHEST 1 VIEW COMPARISON:  05/10/2023 FINDINGS: Left chest tube is in place. Persistent diffuse opacification of the left chest with mediastinal shift to the left. In the absence of pneumonectomy, this is consistent with pleural effusion, atelectasis, and/or consolidation in the lung. No improvement of aeration since previous study. Focal lucency in the lateral chest wall adjacent to the chest tube may represent a small loculated pneumothorax or small area of aeration. Right lung is clear. Calcification of the aorta. IMPRESSION: Persistent volume loss and diffuse opacification of the left chest despite chest tube placement. No improvement since previous study. Electronically Signed   By: Burman Nieves M.D.   On: 05/10/2023 18:18   DG Chest Port 1 View Result Date: 05/10/2023 CLINICAL DATA:  Status post chest tube placement. EXAM: PORTABLE CHEST 1 VIEW COMPARISON:  May 09, 2023. FINDINGS: Stable position of left-sided chest tube. There remains near complete opacification of left hemithorax secondary to probable combination of effusion and atelectasis with reduced amount of aerated lung compared to prior exam. Right lung is clear. Bony thorax is unremarkable. IMPRESSION: Stable position of left-sided chest tube. Continued near  complete opacification of left hemithorax secondary to probable combination of pleural effusion and atelectasis with reduced amount of aerated lung compared to prior exam. Electronically Signed   By: Lupita Raider M.D.   On: 05/10/2023 10:12  DG Chest Port 1 View Result Date: 05/09/2023 CLINICAL DATA:  Status post thoracostomy tube. EXAM: PORTABLE CHEST 1 VIEW COMPARISON:  Chest x-ray 05/09/2023.  Chest CT 05/09/2023. FINDINGS: New left-sided thoracostomy tube is present. Left pleural effusion has significantly decreased. There is a small left pneumothorax likely related to thoracostomy placement. Dense parenchymal opacity persists throughout the mid lung and left lung base. The right lung is clear. The cardiomediastinal silhouette is grossly unchanged. No acute fractures are seen. IMPRESSION: 1. New left-sided thoracostomy tube with significant decrease in left pleural effusion. Small left pneumothorax likely related to thoracostomy placement. 2. Dense parenchymal opacity persists throughout the mid lung and left lung base. Electronically Signed   By: Darliss Cheney M.D.   On: 05/09/2023 20:11   DG Chest 1 View Result Date: 05/09/2023 CLINICAL DATA:  Status post thoracentesis. EXAM: CHEST  1 VIEW COMPARISON:  Chest radiograph dated 05/08/2023 and CT dated 05/09/2023. FINDINGS: Near complete opacification of the left hemithorax. Minimal aeration of the left lung. No pneumothorax. The right lung is clear. Osteopenia with degenerative changes. No acute osseous pathology. IMPRESSION: Near complete opacification of the left hemithorax. No pneumothorax. Electronically Signed   By: Elgie Collard M.D.   On: 05/09/2023 15:06   US THORACENTESIS ASP PLEURAL SPACE W/IMG GUIDE Result Date: 05/09/2023 INDICATION: Patient admitted with sepsis secondary to pneumonia, found to have new large left pleural effusion. Request for diagnostic and therapeutic thoracentesis. EXAM: ULTRASOUND GUIDED LEFT THORACENTESIS  MEDICATIONS: 8 mL 1% lidocaine COMPLICATIONS: None immediate. PROCEDURE: An ultrasound guided thoracentesis was thoroughly discussed with the patient and questions answered. The benefits, risks, alternatives and complications were also discussed. The patient understands and wishes to proceed with the procedure. Written consent was obtained. Ultrasound was performed and noted the effusion to be highly loculated. The largest pocket was selected for thoracentesis and the left chest was marked. The area was then prepped and draped in the normal sterile fashion. 1% Lidocaine was used for local anesthesia. Under ultrasound guidance a 6 Fr Safe-T-Centesis catheter was introduced. Thoracentesis was performed. The catheter was removed and a dressing applied. FINDINGS: A total of approximately 1.0 L of cloudy yellow fluid was removed. Samples were sent to the laboratory as requested by the clinical team. IMPRESSION: Successful ultrasound guided left thoracentesis yielding 1.0 L of pleural fluid. Performed by Lynnette Caffey, PA-C Electronically Signed   By: Marliss Coots M.D.   On: 05/09/2023 14:22   CT CHEST WO CONTRAST Result Date: 05/09/2023 CLINICAL DATA:  Chronic dyspnea.  Left pleural effusion. EXAM: CT CHEST WITHOUT CONTRAST TECHNIQUE: Multidetector CT imaging of the chest was performed following the standard protocol without IV contrast. RADIATION DOSE REDUCTION: This exam was performed according to the departmental dose-optimization program which includes automated exposure control, adjustment of the mA and/or kV according to patient size and/or use of iterative reconstruction technique. COMPARISON:  May 08, 2023.  July 01, 2022. FINDINGS: Cardiovascular: Atherosclerosis of thoracic aorta is noted without aneurysm formation. Normal cardiac size. No pericardial effusion. Coronary artery calcifications are noted. Mediastinum/Nodes: No enlarged mediastinal or axillary lymph nodes. Thyroid gland, trachea, and  esophagus demonstrate no significant findings. Lungs/Pleura: No pneumothorax is noted. Large left pleural effusion is noted with complete atelectasis of the left upper and lower lobes. Mild emphysematous disease is noted in the right lung. Mild right posterior basilar subsegmental atelectasis is noted. Probable scarring is noted anteriorly in right upper lobe. Upper Abdomen: No acute abnormality. Musculoskeletal: No chest wall mass or suspicious bone lesions  identified. IMPRESSION: Large left pleural effusion is noted with complete atelectasis of the left upper and lower lobes. Mild right posterior basilar subsegmental atelectasis is noted. Probable scarring is noted anteriorly in right upper lobe. Coronary artery calcifications are noted suggesting coronary artery disease. Aortic Atherosclerosis (ICD10-I70.0) and Emphysema (ICD10-J43.9). Electronically Signed   By: Lupita Raider M.D.   On: 05/09/2023 11:56   DG Chest Port 1 View Result Date: 05/08/2023 CLINICAL DATA:  Short of breath EXAM: PORTABLE CHEST 1 VIEW COMPARISON:  Radiograph 05/07/2023 FINDINGS: Interval increase in volume of LEFT pleural effusion. The entire LEFT hemithorax is now opacified. No pneumothorax. RIGHT lung clear. No acute osseous abnormality. IMPRESSION: Progressive opacification entire LEFT hemithorax. Findings most consistent with increasing LEFT pleural effusion. Electronically Signed   By: Genevive Bi M.D.   On: 05/08/2023 17:42   ECHOCARDIOGRAM COMPLETE Result Date: 05/08/2023    ECHOCARDIOGRAM REPORT   Patient Name:   IZACC DEMEYER Date of Exam: 05/08/2023 Medical Rec #:  [id number]          Height:       70.0 in Accession #:    [id number]         Weight:       190.7 lb Date of Birth:  05-Dec-1950          BSA:          2.045 m Patient Age:    72 years           BP:           121/94 mmHg Patient Gender: M                  HR:           105 bpm. [facility] Procedure: 2D Echo, Cardiac Doppler and Color  Doppler (Both Spectral and Color            Flow Doppler were utilized during procedure). STAT ECHO Indications:    Pericardial Effusion l31.3  History:        Patient has prior history of Echocardiogram examinations, most                 recent 03/03/2022.  Sonographer:    Celesta Gentile RCS Referring Phys: [id number] ALEXANDRA DEZII IMPRESSIONS  1. Very difficult study. Limited windows for imaging that were off axis. All walls of LV not fully visualized well enough to comment of regional wall motion. Overall LVEF appears grossly normal . There is mild concentric left ventricular hypertrophy.  2. Right ventricular systolic function is low normal. The right ventricular size is normal.  3. Left atrial size was mildly dilated.  4. Right atrial size was mildly dilated.  5. Large pleural effusion.  6. The mitral valve is normal in structure. Trivial mitral valve regurgitation.  7. The aortic valve is tricuspid. Aortic valve regurgitation is not visualized. Aortic valve sclerosis/calcification is present, without any evidence of aortic stenosis.  8. The inferior vena cava is dilated in size with <50% respiratory variability, suggesting right atrial pressure of 15 mmHg. FINDINGS  Left Ventricle: Very difficult study. Limited windows for imaging that were off axis. All walls of LV not fully visualized well enough to comment of regional wall motion. Overall LVEF appears grossly normal. The left ventricular internal cavity size was  normal in size. There is mild concentric left ventricular hypertrophy. Right Ventricle: The right ventricular size is normal. Right vetricular wall thickness was not assessed. Right  ventricular systolic function is low normal. Left Atrium: Left atrial size was mildly dilated. Right Atrium: Right atrial size was mildly dilated. Pericardium: Trivial pericardial effusion is present. Mitral Valve: The mitral valve is normal in structure. Trivial mitral valve regurgitation. Tricuspid Valve: The tricuspid  valve is normal in structure. Tricuspid valve regurgitation is trivial. Aortic Valve: The aortic valve is tricuspid. Aortic valve regurgitation is not visualized. Aortic valve sclerosis/calcification is present, without any evidence of aortic stenosis. Pulmonic Valve: The pulmonic valve was normal in structure. Pulmonic valve regurgitation is not visualized. Aorta: The aortic root is normal in size and structure. Venous: The inferior vena cava is dilated in size with less than 50% respiratory variability, suggesting right atrial pressure of 15 mmHg. IAS/Shunts: No atrial level shunt detected by color flow Doppler. Additional Comments: There is a large pleural effusion.  LEFT VENTRICLE PLAX 2D LVIDd:         4.60 cm LVIDs:         3.60 cm LV PW:         1.20 cm LV IVS:        1.20 cm LVOT diam:     2.10 cm LV SV:         33 LV SV Index:   16 LVOT Area:     3.46 cm  RIGHT VENTRICLE RV S prime:     6.74 cm/s TAPSE (M-mode): 1.6 cm LEFT ATRIUM           Index        RIGHT ATRIUM           Index LA diam:      4.10 cm 2.00 cm/m   RA Area:     21.90 cm LA Vol (A4C): 88.4 ml 43.22 ml/m  RA Volume:   72.90 ml  35.64 ml/m  AORTIC VALVE LVOT Vmax:   77.38 cm/s LVOT Vmean:  44.250 cm/s LVOT VTI:    0.095 m  AORTA Ao Root diam: 4.00 cm MITRAL VALVE MV Area (PHT): 5.02 cm    SHUNTS MV Decel Time: 151 msec    Systemic VTI:  0.09 m MV E velocity: 63.80 cm/s  Systemic Diam: 2.10 cm MV A velocity: 54.40 cm/s MV E/A ratio:  1.17 Dietrich Pates MD Electronically signed by Dietrich Pates MD Signature Date/Time: 05/08/2023/10:38:17 AM    Final    DG Chest 2 View Result Date: 05/07/2023 CLINICAL DATA:  Short of breath, progressive weakness EXAM: CHEST - 2 VIEW COMPARISON:  12/28/2022 FINDINGS: Frontal and lateral views of the chest demonstrate an unremarkable cardiac silhouette. There is dense left basilar consolidation, with moderate left pleural effusion which is partially loculated. Right chest is clear. No pneumothorax. No acute bony  abnormalities. IMPRESSION: 1. Dense left basilar consolidation consistent with pneumonia. 2. Moderate left pleural effusion, partially loculated. Electronically Signed   By: Sharlet Salina M.D.   On: 05/07/2023 21:41    Microbiology: Recent Results (from the past 240 hours)  Resp panel by RT-PCR (RSV, Flu A&B, Covid) Anterior Nasal Swab     Status: None   Collection Time: 05/07/23  7:41 PM   Specimen: Anterior Nasal Swab  Result Value Ref Range Status   SARS Coronavirus 2 by RT PCR NEGATIVE NEGATIVE Final    Comment: (NOTE) SARS-CoV-2 target nucleic acids are NOT DETECTED.  The SARS-CoV-2 RNA is generally detectable in upper respiratory specimens during the acute phase of infection. The lowest concentration of SARS-CoV-2 viral copies this assay can detect is  138 copies/mL. A negative result does not preclude SARS-Cov-2 infection and should not be used as the sole basis for treatment or other patient management decisions. A negative result may occur with  improper specimen collection/handling, submission of specimen other than nasopharyngeal swab, presence of viral mutation(s) within the areas targeted by this assay, and inadequate number of viral copies(<138 copies/mL). A negative result must be combined with clinical observations, patient history, and epidemiological information. The expected result is Negative.  Fact Sheet for Patients:  BloggerCourse.com  Fact Sheet for Healthcare Providers:  SeriousBroker.it  This test is no t yet approved or cleared by the [place] FDA and  has been authorized for detection and/or diagnosis of SARS-CoV-2 by FDA under an Emergency Use Authorization (EUA). This EUA will remain  in effect (meaning this test can be used) for the duration of the COVID-19 declaration under Section 564(b)(1) of the Act, 21 U.S.C.section 360bbb-3(b)(1), unless the authorization is terminated  or revoked sooner.        Influenza A by PCR NEGATIVE NEGATIVE Final   Influenza B by PCR NEGATIVE NEGATIVE Final    Comment: (NOTE) The Xpert Xpress SARS-CoV-2/FLU/RSV plus assay is intended as an aid in the diagnosis of influenza from Nasopharyngeal swab specimens and should not be used as a sole basis for treatment. Nasal washings and aspirates are unacceptable for Xpert Xpress SARS-CoV-2/FLU/RSV testing.  Fact Sheet for Patients: BloggerCourse.com  Fact Sheet for Healthcare Providers: SeriousBroker.it  This test is not yet approved or cleared by the [place] FDA and has been authorized for detection and/or diagnosis of SARS-CoV-2 by FDA under an Emergency Use Authorization (EUA). This EUA will remain in effect (meaning this test can be used) for the duration of the COVID-19 declaration under Section 564(b)(1) of the Act, 21 U.S.C. section 360bbb-3(b)(1), unless the authorization is terminated or revoked.     Resp Syncytial Virus by PCR NEGATIVE NEGATIVE Final    Comment: (NOTE) Fact Sheet for Patients: BloggerCourse.com  Fact Sheet for Healthcare Providers: SeriousBroker.it  This test is not yet approved or cleared by the [place] FDA and has been authorized for detection and/or diagnosis of SARS-CoV-2 by FDA under an Emergency Use Authorization (EUA). This EUA will remain in effect (meaning this test can be used) for the duration of the COVID-19 declaration under Section 564(b)(1) of the Act, 21 U.S.C. section 360bbb-3(b)(1), unless the authorization is terminated or revoked.  Performed at [facility], [address], [place], [place] [address]   Blood Culture (routine x 2)     Status: Abnormal   Collection Time: 05/07/23 10:40 PM   Specimen: BLOOD LEFT ARM  Result Value Ref Range Status   Specimen Description   Final    BLOOD LEFT ARM Performed at [facility],  [address], [place], [place] [address]    Special Requests   Final    BOTTLES DRAWN AEROBIC AND ANAEROBIC Blood Culture adequate volume Performed at [facility], [address], [place], [place] [address]    Culture  Setup Time   Final    GRAM POSITIVE COCCI IN BOTH AEROBIC AND ANAEROBIC BOTTLES Gram Stain Report Called to,Read Back By and Verified With: Melvern Banker, RN AT 1041 05/08/23 BY Kandice Moos Performed at [facility], [address], [place], [place] [address]    Culture (A)  Final    STREPTOCOCCUS PNEUMONIAE SUSCEPTIBILITIES PERFORMED ON PREVIOUS CULTURE WITHIN THE LAST 5 DAYS. Performed at [facility], [address], [place], [place] [address]    Report Status 05/10/2023 FINAL  Final  Blood Culture (routine x 2)     Status: Abnormal   Collection Time: 05/07/23 10:42 PM   Specimen: BLOOD LEFT ARM  Result Value Ref Range Status   Specimen Description   Final    BLOOD LEFT ARM Performed at [facility], [address] [place], [place] [address]    Special Requests   Final    BOTTLES DRAWN AEROBIC AND ANAEROBIC Blood Culture adequate volume Performed at [facility], [address], [place], [place] [address]    Culture  Setup Time   Final    GRAM POSITIVE COCCI IN BOTH AEROBIC AND ANAEROBIC BOTTLES Gram Stain Report Called to,Read Back By and Verified With: EDGAR TINAJERO,RN AT 1041 05/08/23 BY A. SNYDER CRITICAL RESULT CALLED TO, READ BACK BY AND VERIFIED WITH: RN Marigene Ehlers [phone] @1912  FH Performed at [facility], [address], [place], [place] [address]    Culture STREPTOCOCCUS PNEUMONIAE (A)  Final   Report Status 05/10/2023 FINAL  Final   Organism ID, Bacteria STREPTOCOCCUS PNEUMONIAE  Final      Susceptibility   Streptococcus pneumoniae - MIC*    ERYTHROMYCIN <=0.12 SENSITIVE Sensitive     LEVOFLOXACIN 1 SENSITIVE Sensitive     VANCOMYCIN 0.5 SENSITIVE Sensitive     PENICILLIN (meningitis) <=0.06 SENSITIVE Sensitive     PENO -  penicillin <=0.06      PENICILLIN (non-meningitis) <=0.06 SENSITIVE Sensitive     PENICILLIN (oral) <=0.06 SENSITIVE Sensitive     CEFTRIAXONE (non-meningitis) <=0.12 SENSITIVE Sensitive     CEFTRIAXONE (meningitis) <=0.12 SENSITIVE Sensitive     * STREPTOCOCCUS PNEUMONIAE  Blood Culture ID Panel (Reflexed)     Status: Abnormal   Collection Time: 05/07/23 10:42 PM  Result Value Ref Range Status   Enterococcus faecalis NOT DETECTED NOT DETECTED Final   Enterococcus Faecium NOT DETECTED NOT DETECTED Final   Listeria monocytogenes NOT DETECTED NOT DETECTED Final   Staphylococcus species NOT DETECTED NOT DETECTED Final   Staphylococcus aureus (BCID) NOT DETECTED NOT DETECTED Final   Staphylococcus epidermidis NOT DETECTED NOT DETECTED Final   Staphylococcus lugdunensis NOT DETECTED NOT DETECTED Final   Streptococcus species DETECTED (A) NOT DETECTED Final    Comment: CRITICAL RESULT CALLED TO, READ BACK BY AND VERIFIED WITH: RN A. RONE [phone] @1912  FH`    Streptococcus agalactiae NOT DETECTED NOT DETECTED Final   Streptococcus pneumoniae DETECTED (A) NOT DETECTED Final    Comment: CRITICAL RESULT CALLED TO, READ BACK BY AND VERIFIED WITH: RN A. RONE [phone] @1912  FH    Streptococcus pyogenes NOT DETECTED NOT DETECTED Final   A.calcoaceticus-baumannii NOT DETECTED NOT DETECTED Final   Bacteroides fragilis NOT DETECTED NOT DETECTED Final   Enterobacterales NOT DETECTED NOT DETECTED Final   Enterobacter cloacae complex NOT DETECTED NOT DETECTED Final   Escherichia coli NOT DETECTED NOT DETECTED Final   Klebsiella aerogenes NOT DETECTED NOT DETECTED Final   Klebsiella oxytoca NOT DETECTED NOT DETECTED Final   Klebsiella pneumoniae NOT DETECTED NOT DETECTED Final   Proteus species NOT DETECTED NOT DETECTED Final   Salmonella species NOT DETECTED NOT DETECTED Final   Serratia marcescens NOT DETECTED NOT DETECTED Final   Haemophilus influenzae NOT DETECTED NOT DETECTED Final   Neisseria  meningitidis NOT DETECTED NOT DETECTED Final   Pseudomonas aeruginosa NOT DETECTED NOT DETECTED Final   Stenotrophomonas maltophilia NOT DETECTED NOT DETECTED Final   Candida albicans NOT  DETECTED NOT DETECTED Final   Candida auris NOT DETECTED NOT DETECTED Final   Candida glabrata NOT DETECTED NOT DETECTED Final   Candida krusei NOT DETECTED NOT DETECTED Final   Candida parapsilosis NOT DETECTED NOT DETECTED Final   Candida tropicalis NOT DETECTED NOT DETECTED Final   Cryptococcus neoformans/gattii NOT DETECTED NOT DETECTED Final    Comment: Performed at [facility], [address], [place], [place] [address]  MRSA Next Gen by PCR, Nasal     Status: None   Collection Time: 05/08/23 12:40 AM   Specimen: Nasal Mucosa; Nasal Swab  Result Value Ref Range Status   MRSA by PCR Next Gen NOT DETECTED NOT DETECTED Final    Comment: (NOTE) The GeneXpert MRSA Assay (FDA approved for NASAL specimens only), is one component of a comprehensive MRSA colonization surveillance program. It is not intended to diagnose MRSA infection nor to guide or monitor treatment for MRSA infections. Test performance is not FDA approved in patients less than 85 years old. Performed at [facility], [address] [place], [place] [address]   Culture, blood (Routine X 2) w Reflex to ID Panel     Status: None   Collection Time: 05/09/23  4:14 AM   Specimen: BLOOD  Result Value Ref Range Status   Specimen Description BLOOD BLOOD LEFT ARM  Final   Special Requests   Final    BOTTLES DRAWN AEROBIC AND ANAEROBIC Blood Culture adequate volume   Culture   Final    NO GROWTH 5 DAYS Performed at [facility], [address] [place], [place] [address]    Report Status 05/14/2023 FINAL  Final  Culture, blood (Routine X 2) w Reflex to ID Panel     Status: None   Collection Time: 05/09/23  4:16 AM   Specimen: BLOOD  Result Value Ref Range Status   Specimen Description BLOOD BLOOD LEFT HAND  Final   Special  Requests   Final    BOTTLES DRAWN AEROBIC AND ANAEROBIC Blood Culture adequate volume   Culture   Final    NO GROWTH 5 DAYS Performed at [facility], [address], [place], [place] [address]    Report Status 05/14/2023 FINAL  Final  Gram stain     Status: None   Collection Time: 05/09/23 12:55 PM   Specimen: Pleura  Result Value Ref Range Status   Specimen Description PLEURAL  Final   Special Requests NONE  Final   Gram Stain   Final    PLEURAL GRAM POSITIVE COCCI WBC PRESENT, PREDOMINANTLY PMN CYTOSPIN SMEAR Gram Stain Report Called to,Read Back By and Verified With: Quin Hoop, RN AT 1514 05/09/23 BY Kandice Moos Performed at [facility], [address] [place], [place] [address]    Report Status 05/09/2023 FINAL  Final  Culture, body fluid w Gram Stain-bottle     Status: None   Collection Time: 05/09/23 12:55 PM   Specimen: Pleura  Result Value Ref Range Status   Specimen Description PLEURAL  Final   Special Requests BOTTLES DRAWN AEROBIC AND ANAEROBIC 10CC  Final   Culture   Final    NO GROWTH 5 DAYS Performed at [facility], [address], [place], [place] [address]    Report Status 05/14/2023 FINAL  Final     Signed: Thad Ranger, MD 05/20/2023

## 2023-06-12 NOTE — Plan of Care (Signed)
  Interdisciplinary Goals of Care Family Meeting   Date carried out: 06/06/2023  Location of the meeting: Bedside  Member's involved: Patient's wife, myself at bedside.  Durable Power of Insurance risk surveyor: Wife Anthony Little)  Discussion: We discussed goals of care for  Anthony Little  We discussed diagnoses, prognosis, GOC, EOL wishes disposition and options.   Discussed regarding advanced directives in detail.  Concepts specific to code status, artifical feeding and hydration, IV antibiotics and rehospitalization were discussed.  The difference between an aggressive medical intervention path and a comfort care path was discussed.   Discussed limitations of medical interventions to prolong quality of life in some situations.  Patient appeared to be having shallow breathing, somnolent, secretions, actively dying, struggling to breathe.  Goals of care were discussed, patient's wife requested full comfort care.  Code status: Full DNR, comfort care.  Disposition: Anticipated hospital death  Time spent for the meeting:   Jenevie Casstevens, MD 06/04/2023, 8:47 AM

## 2023-06-12 NOTE — Progress Notes (Signed)
 Pt with desaturation to 86% on 10L salter HFNC. Pt NT suctioned x2 for scant secretions. Scheduled neb treatments given and HFNC titrated to 15L. SpO2 rose to 94%. RT will continue to monitor and be available. Wife remains at pt bedside.

## 2023-06-12 NOTE — Telephone Encounter (Signed)
 Front staff-patient passed away today. Please forward sympathy card for me to fill out I spoke with the wife-Anthony Little-this afternoon  Thank you-Dr. Lorin Picket

## 2023-06-12 DEATH — deceased
